# Patient Record
Sex: Female | Born: 1955
Health system: Southern US, Community
[De-identification: ages and names within clinical notes are randomized; demographics above are authoritative.]

## PROBLEM LIST (undated history)

## (undated) DIAGNOSIS — G510 Bell's palsy: Secondary | ICD-10-CM

## (undated) DIAGNOSIS — D649 Anemia, unspecified: Secondary | ICD-10-CM

## (undated) DIAGNOSIS — O1490 Unspecified pre-eclampsia, unspecified trimester: Secondary | ICD-10-CM

## (undated) DIAGNOSIS — I10 Essential (primary) hypertension: Secondary | ICD-10-CM

## (undated) DIAGNOSIS — K7460 Unspecified cirrhosis of liver: Secondary | ICD-10-CM

## (undated) DIAGNOSIS — F1910 Other psychoactive substance abuse, uncomplicated: Secondary | ICD-10-CM

## (undated) DIAGNOSIS — M419 Scoliosis, unspecified: Secondary | ICD-10-CM

## (undated) DIAGNOSIS — F32A Depression, unspecified: Secondary | ICD-10-CM

## (undated) DIAGNOSIS — N289 Disorder of kidney and ureter, unspecified: Secondary | ICD-10-CM

## (undated) DIAGNOSIS — I2510 Atherosclerotic heart disease of native coronary artery without angina pectoris: Secondary | ICD-10-CM

## (undated) DIAGNOSIS — E119 Type 2 diabetes mellitus without complications: Secondary | ICD-10-CM

## (undated) DIAGNOSIS — J449 Chronic obstructive pulmonary disease, unspecified: Secondary | ICD-10-CM

## (undated) DIAGNOSIS — G8929 Other chronic pain: Secondary | ICD-10-CM

## (undated) DIAGNOSIS — M1990 Unspecified osteoarthritis, unspecified site: Secondary | ICD-10-CM

## (undated) DIAGNOSIS — H269 Unspecified cataract: Secondary | ICD-10-CM

## (undated) HISTORY — PX: CARPAL TUNNEL RELEASE: SHX101

## (undated) HISTORY — PX: ROTATOR CUFF REPAIR: SHX139

## (undated) HISTORY — PX: ELBOW SURGERY: SHX618

## (undated) HISTORY — PX: SHOULDER SURGERY: SHX246

## (undated) HISTORY — PX: TUBAL LIGATION: SHX77

## (undated) HISTORY — PX: BACK SURGERY: SHX140

---

## 1999-07-10 ENCOUNTER — Emergency Department (HOSPITAL_COMMUNITY): Admission: EM | Admit: 1999-07-10 | Discharge: 1999-07-10 | Payer: Self-pay | Admitting: Emergency Medicine

## 2001-05-02 ENCOUNTER — Encounter: Admission: RE | Admit: 2001-05-02 | Discharge: 2001-06-01 | Payer: Self-pay | Admitting: Orthopaedic Surgery

## 2003-03-07 ENCOUNTER — Encounter: Admission: RE | Admit: 2003-03-07 | Discharge: 2003-03-07 | Payer: Self-pay | Admitting: *Deleted

## 2006-09-08 ENCOUNTER — Encounter: Admission: RE | Admit: 2006-09-08 | Discharge: 2006-09-08 | Payer: Self-pay | Admitting: *Deleted

## 2007-10-07 ENCOUNTER — Encounter: Admission: RE | Admit: 2007-10-07 | Discharge: 2007-10-07 | Payer: Self-pay | Admitting: *Deleted

## 2008-06-04 ENCOUNTER — Encounter: Admission: RE | Admit: 2008-06-04 | Discharge: 2008-06-04 | Payer: Self-pay | Admitting: Gastroenterology

## 2008-10-08 ENCOUNTER — Encounter: Admission: RE | Admit: 2008-10-08 | Discharge: 2008-10-08 | Payer: Self-pay | Admitting: *Deleted

## 2009-11-06 ENCOUNTER — Encounter: Admission: RE | Admit: 2009-11-06 | Discharge: 2009-11-06 | Payer: Self-pay | Admitting: *Deleted

## 2010-11-19 ENCOUNTER — Other Ambulatory Visit: Payer: Self-pay | Admitting: *Deleted

## 2010-11-19 DIAGNOSIS — Z1231 Encounter for screening mammogram for malignant neoplasm of breast: Secondary | ICD-10-CM

## 2010-11-26 ENCOUNTER — Ambulatory Visit
Admission: RE | Admit: 2010-11-26 | Discharge: 2010-11-26 | Disposition: A | Discharge status: Home / Self Care | Payer: Commercial Managed Care - PPO | Source: Ambulatory Visit | Attending: *Deleted | Admitting: *Deleted

## 2010-11-26 DIAGNOSIS — Z1231 Encounter for screening mammogram for malignant neoplasm of breast: Secondary | ICD-10-CM

## 2011-06-19 DIAGNOSIS — K5900 Constipation, unspecified: Secondary | ICD-10-CM | POA: Diagnosis not present

## 2011-06-19 DIAGNOSIS — M4120 Other idiopathic scoliosis, site unspecified: Secondary | ICD-10-CM | POA: Diagnosis not present

## 2011-06-19 DIAGNOSIS — F329 Major depressive disorder, single episode, unspecified: Secondary | ICD-10-CM | POA: Diagnosis not present

## 2011-06-19 DIAGNOSIS — N390 Urinary tract infection, site not specified: Secondary | ICD-10-CM | POA: Diagnosis not present

## 2011-06-19 DIAGNOSIS — F3289 Other specified depressive episodes: Secondary | ICD-10-CM | POA: Diagnosis not present

## 2011-09-04 ENCOUNTER — Encounter (HOSPITAL_COMMUNITY): Payer: Self-pay | Admitting: *Deleted

## 2011-09-04 ENCOUNTER — Emergency Department (HOSPITAL_COMMUNITY)
Admission: EM | Admit: 2011-09-04 | Discharge: 2011-09-04 | Disposition: A | Discharge status: Home / Self Care | Payer: Commercial Managed Care - PPO | Attending: Emergency Medicine | Admitting: Emergency Medicine

## 2011-09-04 DIAGNOSIS — Z79899 Other long term (current) drug therapy: Secondary | ICD-10-CM | POA: Diagnosis not present

## 2011-09-04 DIAGNOSIS — N390 Urinary tract infection, site not specified: Secondary | ICD-10-CM | POA: Insufficient documentation

## 2011-09-04 DIAGNOSIS — G8929 Other chronic pain: Secondary | ICD-10-CM | POA: Insufficient documentation

## 2011-09-04 HISTORY — DX: Other chronic pain: G89.29

## 2011-09-04 LAB — COMPREHENSIVE METABOLIC PANEL
ALT: 26 U/L (ref 0–35)
AST: 22 U/L (ref 0–37)
Albumin: 3.9 g/dL (ref 3.5–5.2)
Alkaline Phosphatase: 97 U/L (ref 39–117)
BUN: 13 mg/dL (ref 6–23)
CO2: 26 mEq/L (ref 19–32)
Calcium: 9.5 mg/dL (ref 8.4–10.5)
Chloride: 103 mEq/L (ref 96–112)
Creatinine, Ser: 0.76 mg/dL (ref 0.50–1.10)
GFR calc Af Amer: >90 mL/min (ref >90)
GFR calc non Af Amer: >90 mL/min (ref >90)
Glucose, Bld: 83 mg/dL (ref 70–99)
Potassium: 4.7 mEq/L (ref 3.5–5.1)
Sodium: 139 mEq/L (ref 135–145)
Total Bilirubin: 0.2 mg/dL — ABNORMAL LOW (ref 0.3–1.2)
Total Protein: 8.1 g/dL (ref 6.0–8.3)

## 2011-09-04 LAB — DIFFERENTIAL
Basophils Absolute: 0 10*3/uL (ref 0.0–0.1)
Basophils Relative: 1 % (ref 0–1)
Eosinophils Absolute: 0.1 10*3/uL (ref 0.0–0.7)
Eosinophils Relative: 2 % (ref 0–5)
Lymphocytes Relative: 39 % (ref 12–46)
Lymphs Abs: 2.2 10*3/uL (ref 0.7–4.0)
Monocytes Absolute: 0.6 10*3/uL (ref 0.1–1.0)
Monocytes Relative: 10 % (ref 3–12)
Neutro Abs: 2.8 10*3/uL (ref 1.7–7.7)
Neutrophils Relative %: 49 % (ref 43–77)

## 2011-09-04 LAB — URINALYSIS, ROUTINE W REFLEX MICROSCOPIC
Bilirubin Urine: NEGATIVE
Glucose, UA: NEGATIVE mg/dL
Hgb urine dipstick: NEGATIVE
Ketones, ur: NEGATIVE mg/dL
Nitrite: NEGATIVE
Protein, ur: NEGATIVE mg/dL
Specific Gravity, Urine: 1.021 (ref 1.005–1.030)
Urobilinogen, UA: 1 mg/dL (ref 0.0–1.0)
pH: 7 (ref 5.0–8.0)

## 2011-09-04 LAB — URINE MICROSCOPIC-ADD ON

## 2011-09-04 LAB — CBC
HCT: 42.3 % (ref 36.0–46.0)
Hemoglobin: 13.6 g/dL (ref 12.0–15.0)
MCH: 29.1 pg (ref 26.0–34.0)
MCHC: 32.2 g/dL (ref 30.0–36.0)
MCV: 90.4 fL (ref 78.0–100.0)
Platelets: 189 10*3/uL (ref 150–400)
RBC: 4.68 MIL/uL (ref 3.87–5.11)
RDW: 13.4 % (ref 11.5–15.5)
WBC: 5.7 10*3/uL (ref 4.0–10.5)

## 2011-09-04 LAB — RAPID URINE DRUG SCREEN, HOSP PERFORMED
Amphetamines: NOT DETECTED
Barbiturates: NOT DETECTED
Benzodiazepines: NOT DETECTED
Cocaine: NOT DETECTED
Opiates: NOT DETECTED
Tetrahydrocannabinol: NOT DETECTED

## 2011-09-04 LAB — ETHANOL: Alcohol, Ethyl (B): <11 mg/dL (ref 0–11)

## 2011-09-04 MED ORDER — CEPHALEXIN 500 MG PO CAPS: 500.0000 mg | ORAL_CAPSULE | Freq: Four times a day (QID) | ORAL | Status: AC

## 2011-09-04 MED ORDER — OXYCODONE-ACETAMINOPHEN 10-325 MG PO TABS: 1.0000 | ORAL_TABLET | ORAL | Status: AC | PRN

## 2011-09-04 NOTE — ED Notes (Signed)
The pt has pain in all her muscles but especially in her lt hip  And leg sicne Saturday since she ran out of her pain meds.  She thihks she is in withdrawal.  She has been on narcotic pain meds for 13 years and she recently  Lost her prescribing doctor  Doctor to a disagreement.  She was taking oxycontin 80mg  3 times a day and oxycodone 10mg  4 times a day until sat.  She has chronic pain

## 2011-09-04 NOTE — ED Notes (Signed)
Pt given rx x 2, discharge and follow up instructions without further questions after speaking with MD. Denies further needs at this time

## 2011-09-04 NOTE — Discharge Instructions (Signed)
You should return to the ED with any concerns including fever/chills, vomiting and not able to keep down liquids, decreased level of alertness/lethargy, or any other alarming symptoms  You should make sure to arrange for a follow up appointment with Pain management

## 2011-09-04 NOTE — ED Notes (Signed)
Pain to left hip and generalized across body. Hx of multiple surgeries to back/shoulders/elbow. States no longer has PCP and is out of pain medications. No new injury. States needs her oxycontin and percocet filled.

## 2011-09-04 NOTE — ED Provider Notes (Signed)
History     CSN: [id number]  Arrival date & time 09/04/11  1631   First MD Initiated Contact with Patient 09/04/11 1825      Chief Complaint  Patient presents with  . pain multiple sites     (Consider location/radiation/quality/duration/timing/severity/associated sxs/prior treatment) HPI Pt presents with c/o chronic pain and has run out of her pain medications.  She states she takes oxycontin three times daily and percocet 10.  She has had a disagreement and "parted ways" with her primary doctor who had been prescribing her meds.  She states she has been weaning the meds taking smaller doses than usual to make the meds that she has last.  She is afraid she is in withdrawal, however- no sweats, no vomiting/diarrhea, no abdominal pain.  There are no other associated systemic symptoms.   Past Medical History  Diagnosis Date  . Chronic pain     History reviewed. No pertinent past surgical history.  No family history on file.  History  Substance Use Topics  . Smoking status: Never Smoker   . Smokeless tobacco: Not on file  . Alcohol Use: No    OB History    Grav Para Term Preterm Abortions TAB SAB Ect Mult Living                  Review of Systems ROS reviewed and all otherwise negative except for mentioned in HPI  Allergies  Review of patient's allergies indicates no known allergies.  Home Medications   Current Outpatient Rx  Name Route Sig Dispense Refill  . ARIPIPRAZOLE 5 MG PO TABS Oral Take 5 mg by mouth daily.    . BUPROPION HCL ER (SR) 150 MG PO TB12 Oral Take 150 mg by mouth daily.    Marland Kitchen CITALOPRAM HYDROBROMIDE 40 MG PO TABS Oral Take 40 mg by mouth daily.    Marland Kitchen FERROUS FUM-IRON POLYSACCH 162-115.2 MG PO CAPS Oral Take 1 capsule by mouth daily with breakfast.    . LUBIPROSTONE 24 MCG PO CAPS Oral Take 24 mcg by mouth 3 (three) times daily.    Marland Kitchen MAGNESIUM OXIDE 400 MG PO TABS Oral Take 400 mg by mouth daily.    Marland Kitchen MODAFINIL 200 MG PO TABS Oral Take 200 mg by  mouth daily.    . OXYBUTYNIN CHLORIDE 5 MG PO TABS Oral Take 5 mg by mouth daily.    . OXYCODONE HCL ER 80 MG PO TB12 Oral Take 80 mg by mouth every 12 (twelve) hours.    . OXYCODONE-ACETAMINOPHEN 10-325 MG PO TABS Oral Take 1 tablet by mouth every 4 (four) hours as needed. For pain    . PANTOPRAZOLE SODIUM 40 MG PO TBEC Oral Take 40 mg by mouth daily.    Marland Kitchen PRAMIPEXOLE DIHYDROCHLORIDE 1 MG PO TABS Oral Take 1 mg by mouth daily.    . SENNA 8.6 MG PO TABS Oral Take 1 tablet by mouth 4 (four) times daily - after meals and at bedtime.    Marland Kitchen TIZANIDINE HCL 4 MG PO TABS Oral Take 4 mg by mouth 4 (four) times daily.    . TOPIRAMATE 100 MG PO TABS Oral Take 100 mg by mouth 2 (two) times daily.    . TORSEMIDE 20 MG PO TABS Oral Take 20 mg by mouth daily.    . CEPHALEXIN 500 MG PO CAPS Oral Take 1 capsule (500 mg total) by mouth 4 (four) times daily. 40 capsule 0  . OXYCODONE-ACETAMINOPHEN 10-325 MG PO TABS Oral  Take 1 tablet by mouth every 4 (four) hours as needed for pain. 15 tablet 0    BP 173/89  Pulse 74  Temp(Src) 98.6 F (37 C) (Oral)  Resp 18  SpO2 95%  LMP 04/06/2011 Vitals reviewed Physical Exam Physical Examination: General appearance - alert, well appearing, and in no distress Mental status - alert, oriented to person, place, and time Chest - clear to auscultation, no wheezes, rales or rhonchi, symmetric air entry Heart - normal rate, regular rhythm, normal S1, S2, no murmurs, rubs, clicks or gallops Abdomen - soft, nontender, nondistended, no masses or organomegaly Neurological - alert, oriented, normal speech, no focal findings or movement disorder noted Musculoskeletal - no joint tenderness, deformity or swelling Extremities - peripheral pulses normal, no pedal edema, no clubbing or cyanosis Skin - normal coloration and turgor, no rashes, no suspicious skin lesions noted pscyh- calm and cooperative, normal mood  ED Course  Procedures (including critical care time)  Labs  Reviewed  URINALYSIS, ROUTINE W REFLEX MICROSCOPIC - Abnormal; Notable for the following:    APPearance HAZY (*)    Leukocytes, UA MODERATE (*)    All other components within normal limits  COMPREHENSIVE METABOLIC PANEL - Abnormal; Notable for the following:    Total Bilirubin 0.2 (*)    All other components within normal limits  URINE MICROSCOPIC-ADD ON - Abnormal; Notable for the following:    Squamous Epithelial / LPF MANY (*)    Bacteria, UA FEW (*)    All other components within normal limits  URINE RAPID DRUG SCREEN (HOSP PERFORMED)  CBC  DIFFERENTIAL  ETHANOL  LAB REPORT - SCANNED   No results found.   1. Chronic pain   2. Urinary tract infection       MDM  Pt presenting with diffuse/generalized body pain which she states is chronic in nature- she has run out of oxycontin and oxycodone.  I have discussed pain management with her and the fact that she will need to see a pain management specialist or PMD to prescribe her long acting narcotics and chronic meds.  She was given a short course of oxycodone, also abx for likely UTI.  Urine culture sent.  Pt discharged with stirct return rpecautions, she is agreeable with this plan.         Ethelda Chick, MD 09/05/11 2017

## 2011-10-23 DIAGNOSIS — M545 Low back pain, unspecified: Secondary | ICD-10-CM | POA: Diagnosis not present

## 2011-10-23 DIAGNOSIS — M533 Sacrococcygeal disorders, not elsewhere classified: Secondary | ICD-10-CM | POA: Diagnosis not present

## 2011-10-23 DIAGNOSIS — G541 Lumbosacral plexus disorders: Secondary | ICD-10-CM | POA: Diagnosis not present

## 2011-10-23 DIAGNOSIS — Z9181 History of falling: Secondary | ICD-10-CM | POA: Diagnosis not present

## 2011-10-23 DIAGNOSIS — M542 Cervicalgia: Secondary | ICD-10-CM | POA: Diagnosis not present

## 2011-10-23 DIAGNOSIS — M5137 Other intervertebral disc degeneration, lumbosacral region: Secondary | ICD-10-CM | POA: Diagnosis not present

## 2011-10-23 DIAGNOSIS — Z79899 Other long term (current) drug therapy: Secondary | ICD-10-CM | POA: Diagnosis not present

## 2011-10-23 DIAGNOSIS — H81399 Other peripheral vertigo, unspecified ear: Secondary | ICD-10-CM | POA: Diagnosis not present

## 2011-10-23 DIAGNOSIS — R269 Unspecified abnormalities of gait and mobility: Secondary | ICD-10-CM | POA: Diagnosis not present

## 2011-10-23 DIAGNOSIS — S335XXA Sprain of ligaments of lumbar spine, initial encounter: Secondary | ICD-10-CM | POA: Diagnosis not present

## 2011-11-03 ENCOUNTER — Other Ambulatory Visit: Payer: Self-pay | Admitting: *Deleted

## 2011-11-03 ENCOUNTER — Other Ambulatory Visit: Payer: Self-pay | Admitting: Family Medicine

## 2011-11-03 DIAGNOSIS — Z1231 Encounter for screening mammogram for malignant neoplasm of breast: Secondary | ICD-10-CM

## 2011-11-09 DIAGNOSIS — R269 Unspecified abnormalities of gait and mobility: Secondary | ICD-10-CM | POA: Diagnosis not present

## 2011-11-09 DIAGNOSIS — M25559 Pain in unspecified hip: Secondary | ICD-10-CM | POA: Diagnosis not present

## 2011-11-12 DIAGNOSIS — M25559 Pain in unspecified hip: Secondary | ICD-10-CM | POA: Diagnosis not present

## 2011-11-12 DIAGNOSIS — R269 Unspecified abnormalities of gait and mobility: Secondary | ICD-10-CM | POA: Diagnosis not present

## 2011-11-16 DIAGNOSIS — M545 Low back pain, unspecified: Secondary | ICD-10-CM | POA: Diagnosis not present

## 2011-11-16 DIAGNOSIS — H81399 Other peripheral vertigo, unspecified ear: Secondary | ICD-10-CM | POA: Diagnosis not present

## 2011-11-16 DIAGNOSIS — G541 Lumbosacral plexus disorders: Secondary | ICD-10-CM | POA: Diagnosis not present

## 2011-11-16 DIAGNOSIS — M5137 Other intervertebral disc degeneration, lumbosacral region: Secondary | ICD-10-CM | POA: Diagnosis not present

## 2011-11-20 DIAGNOSIS — H81399 Other peripheral vertigo, unspecified ear: Secondary | ICD-10-CM | POA: Diagnosis not present

## 2011-11-20 DIAGNOSIS — G541 Lumbosacral plexus disorders: Secondary | ICD-10-CM | POA: Diagnosis not present

## 2011-11-20 DIAGNOSIS — M545 Low back pain, unspecified: Secondary | ICD-10-CM | POA: Diagnosis not present

## 2011-11-20 DIAGNOSIS — M5137 Other intervertebral disc degeneration, lumbosacral region: Secondary | ICD-10-CM | POA: Diagnosis not present

## 2011-12-01 ENCOUNTER — Ambulatory Visit
Admission: RE | Admit: 2011-12-01 | Discharge: 2011-12-01 | Disposition: A | Discharge status: Home / Self Care | Payer: Medicare Other | Source: Ambulatory Visit | Attending: Family Medicine | Admitting: Family Medicine

## 2011-12-01 DIAGNOSIS — Z1231 Encounter for screening mammogram for malignant neoplasm of breast: Secondary | ICD-10-CM

## 2011-12-02 ENCOUNTER — Other Ambulatory Visit: Payer: Self-pay | Admitting: Family Medicine

## 2011-12-02 DIAGNOSIS — R928 Other abnormal and inconclusive findings on diagnostic imaging of breast: Secondary | ICD-10-CM

## 2011-12-08 ENCOUNTER — Ambulatory Visit
Admission: RE | Admit: 2011-12-08 | Discharge: 2011-12-08 | Disposition: A | Discharge status: Home / Self Care | Payer: Commercial Managed Care - PPO | Source: Ambulatory Visit | Attending: Family Medicine | Admitting: Family Medicine

## 2011-12-08 DIAGNOSIS — R928 Other abnormal and inconclusive findings on diagnostic imaging of breast: Secondary | ICD-10-CM

## 2011-12-15 DIAGNOSIS — G541 Lumbosacral plexus disorders: Secondary | ICD-10-CM | POA: Diagnosis not present

## 2011-12-15 DIAGNOSIS — M5137 Other intervertebral disc degeneration, lumbosacral region: Secondary | ICD-10-CM | POA: Diagnosis not present

## 2011-12-15 DIAGNOSIS — H81399 Other peripheral vertigo, unspecified ear: Secondary | ICD-10-CM | POA: Diagnosis not present

## 2011-12-15 DIAGNOSIS — M545 Low back pain, unspecified: Secondary | ICD-10-CM | POA: Diagnosis not present

## 2012-01-12 DIAGNOSIS — G541 Lumbosacral plexus disorders: Secondary | ICD-10-CM | POA: Diagnosis not present

## 2012-01-12 DIAGNOSIS — M545 Low back pain, unspecified: Secondary | ICD-10-CM | POA: Diagnosis not present

## 2012-01-12 DIAGNOSIS — H81399 Other peripheral vertigo, unspecified ear: Secondary | ICD-10-CM | POA: Diagnosis not present

## 2012-01-12 DIAGNOSIS — M5137 Other intervertebral disc degeneration, lumbosacral region: Secondary | ICD-10-CM | POA: Diagnosis not present

## 2012-01-12 DIAGNOSIS — Z79899 Other long term (current) drug therapy: Secondary | ICD-10-CM | POA: Diagnosis not present

## 2012-01-18 DIAGNOSIS — M5380 Other specified dorsopathies, site unspecified: Secondary | ICD-10-CM | POA: Diagnosis not present

## 2012-03-07 DIAGNOSIS — Z79899 Other long term (current) drug therapy: Secondary | ICD-10-CM | POA: Diagnosis not present

## 2012-03-07 DIAGNOSIS — M542 Cervicalgia: Secondary | ICD-10-CM | POA: Diagnosis not present

## 2012-04-04 DIAGNOSIS — Z79899 Other long term (current) drug therapy: Secondary | ICD-10-CM | POA: Diagnosis not present

## 2012-04-04 DIAGNOSIS — M542 Cervicalgia: Secondary | ICD-10-CM | POA: Diagnosis not present

## 2012-05-17 ENCOUNTER — Other Ambulatory Visit: Payer: Self-pay | Admitting: Family Medicine

## 2012-05-17 DIAGNOSIS — N63 Unspecified lump in unspecified breast: Secondary | ICD-10-CM

## 2012-06-10 ENCOUNTER — Ambulatory Visit
Admission: RE | Admit: 2012-06-10 | Discharge: 2012-06-10 | Disposition: A | Discharge status: Home / Self Care | Payer: Commercial Managed Care - PPO | Source: Ambulatory Visit | Attending: Family Medicine | Admitting: Family Medicine

## 2012-06-10 DIAGNOSIS — N63 Unspecified lump in unspecified breast: Secondary | ICD-10-CM

## 2013-01-05 ENCOUNTER — Other Ambulatory Visit: Payer: Self-pay

## 2013-01-05 DIAGNOSIS — Z1231 Encounter for screening mammogram for malignant neoplasm of breast: Secondary | ICD-10-CM

## 2013-01-25 ENCOUNTER — Ambulatory Visit
Admission: RE | Admit: 2013-01-25 | Discharge: 2013-01-25 | Disposition: A | Discharge status: Home / Self Care | Payer: Commercial Managed Care - PPO | Source: Ambulatory Visit

## 2013-01-25 DIAGNOSIS — Z1231 Encounter for screening mammogram for malignant neoplasm of breast: Secondary | ICD-10-CM

## 2013-07-08 ENCOUNTER — Emergency Department (HOSPITAL_COMMUNITY): Payer: Commercial Managed Care - PPO

## 2013-07-08 ENCOUNTER — Observation Stay (HOSPITAL_COMMUNITY)
Admission: EM | Admit: 2013-07-08 | Discharge: 2013-07-10 | Disposition: A | Discharge status: Home / Self Care | Payer: Commercial Managed Care - PPO | Attending: Neurosurgery | Admitting: Neurosurgery

## 2013-07-08 ENCOUNTER — Encounter (HOSPITAL_COMMUNITY): Payer: Self-pay | Admitting: Emergency Medicine

## 2013-07-08 DIAGNOSIS — M4802 Spinal stenosis, cervical region: Secondary | ICD-10-CM | POA: Diagnosis not present

## 2013-07-08 DIAGNOSIS — M4120 Other idiopathic scoliosis, site unspecified: Secondary | ICD-10-CM | POA: Insufficient documentation

## 2013-07-08 DIAGNOSIS — M47817 Spondylosis without myelopathy or radiculopathy, lumbosacral region: Secondary | ICD-10-CM | POA: Diagnosis not present

## 2013-07-08 DIAGNOSIS — E119 Type 2 diabetes mellitus without complications: Secondary | ICD-10-CM | POA: Diagnosis not present

## 2013-07-08 DIAGNOSIS — M47812 Spondylosis without myelopathy or radiculopathy, cervical region: Secondary | ICD-10-CM | POA: Diagnosis not present

## 2013-07-08 DIAGNOSIS — I10 Essential (primary) hypertension: Secondary | ICD-10-CM | POA: Insufficient documentation

## 2013-07-08 DIAGNOSIS — R32 Unspecified urinary incontinence: Secondary | ICD-10-CM | POA: Insufficient documentation

## 2013-07-08 DIAGNOSIS — M4712 Other spondylosis with myelopathy, cervical region: Secondary | ICD-10-CM | POA: Diagnosis not present

## 2013-07-08 DIAGNOSIS — G8929 Other chronic pain: Secondary | ICD-10-CM | POA: Insufficient documentation

## 2013-07-08 DIAGNOSIS — Z9181 History of falling: Secondary | ICD-10-CM | POA: Insufficient documentation

## 2013-07-08 DIAGNOSIS — H269 Unspecified cataract: Secondary | ICD-10-CM | POA: Insufficient documentation

## 2013-07-08 DIAGNOSIS — G959 Disease of spinal cord, unspecified: Secondary | ICD-10-CM

## 2013-07-08 DIAGNOSIS — M48061 Spinal stenosis, lumbar region without neurogenic claudication: Secondary | ICD-10-CM | POA: Diagnosis not present

## 2013-07-08 HISTORY — DX: Unspecified cataract: H26.9

## 2013-07-08 HISTORY — DX: Disorder of kidney and ureter, unspecified: N28.9

## 2013-07-08 HISTORY — DX: Unspecified osteoarthritis, unspecified site: M19.90

## 2013-07-08 HISTORY — DX: Unspecified pre-eclampsia, unspecified trimester: O14.90

## 2013-07-08 HISTORY — DX: Bell's palsy: G51.0

## 2013-07-08 HISTORY — DX: Scoliosis, unspecified: M41.9

## 2013-07-08 LAB — URINALYSIS, ROUTINE W REFLEX MICROSCOPIC
BILIRUBIN URINE: NEGATIVE
Glucose, UA: NEGATIVE mg/dL
Hgb urine dipstick: NEGATIVE
Ketones, ur: NEGATIVE mg/dL
Leukocytes, UA: NEGATIVE
Nitrite: NEGATIVE
PH: 6.5 (ref 5.0–8.0)
Protein, ur: NEGATIVE mg/dL
Specific Gravity, Urine: 1.014 (ref 1.005–1.030)
Urobilinogen, UA: 0.2 mg/dL (ref 0.0–1.0)

## 2013-07-08 LAB — COMPREHENSIVE METABOLIC PANEL
ALBUMIN: 3.7 g/dL (ref 3.5–5.2)
ALK PHOS: 87 U/L (ref 39–117)
ALT: 28 U/L (ref 0–35)
AST: 23 U/L (ref 0–37)
BUN: 22 mg/dL (ref 6–23)
CO2: 26 mEq/L (ref 19–32)
Calcium: 9.8 mg/dL (ref 8.4–10.5)
Chloride: 100 mEq/L (ref 96–112)
Creatinine, Ser: 0.82 mg/dL (ref 0.50–1.10)
GFR calc Af Amer: >90 mL/min (ref >90)
GFR calc non Af Amer: 78 mL/min — ABNORMAL LOW (ref >90)
GLUCOSE: 161 mg/dL — AB (ref 70–99)
Potassium: 4.5 mEq/L (ref 3.7–5.3)
SODIUM: 138 meq/L (ref 137–147)
TOTAL PROTEIN: 7.5 g/dL (ref 6.0–8.3)
Total Bilirubin: 0.2 mg/dL — ABNORMAL LOW (ref 0.3–1.2)

## 2013-07-08 LAB — CBC
HCT: 39.6 % (ref 36.0–46.0)
HEMOGLOBIN: 13.8 g/dL (ref 12.0–15.0)
MCH: 29.6 pg (ref 26.0–34.0)
MCHC: 34.8 g/dL (ref 30.0–36.0)
MCV: 85 fL (ref 78.0–100.0)
Platelets: 229 10*3/uL (ref 150–400)
RBC: 4.66 MIL/uL (ref 3.87–5.11)
RDW: 13.5 % (ref 11.5–15.5)
WBC: 6.4 10*3/uL (ref 4.0–10.5)

## 2013-07-08 MED ORDER — HYDROMORPHONE HCL PF 1 MG/ML IJ SOLN
1.0000 mg | INTRAMUSCULAR | Status: DC | PRN
  Administered 2013-07-09: 1 mg via INTRAVENOUS

## 2013-07-08 MED ORDER — HYDROMORPHONE HCL PF 1 MG/ML IJ SOLN
1.0000 mg | INTRAMUSCULAR | Status: AC | PRN
  Administered 2013-07-08 (×2): 1 mg via INTRAVENOUS
  Filled 2013-07-08 (×2): qty 1

## 2013-07-08 MED ORDER — DEXTROSE-NACL 5-0.45 % IV SOLN: INTRAVENOUS | Status: DC

## 2013-07-08 MED ORDER — SODIUM CHLORIDE 0.9 % IV SOLN
INTRAVENOUS | Status: DC
  Administered 2013-07-08: 23:00:00 via INTRAVENOUS

## 2013-07-08 MED ORDER — ONDANSETRON HCL 4 MG/2ML IJ SOLN
4.0000 mg | Freq: Once | INTRAMUSCULAR | Status: AC
  Administered 2013-07-08: 4 mg via INTRAVENOUS
  Filled 2013-07-08: qty 2

## 2013-07-08 MED ORDER — DOCUSATE SODIUM 100 MG PO CAPS
100.0000 mg | ORAL_CAPSULE | Freq: Two times a day (BID) | ORAL | Status: DC
  Administered 2013-07-08 – 2013-07-10 (×4): 100 mg via ORAL
  Filled 2013-07-08 (×4): qty 1

## 2013-07-08 MED ORDER — ACETAMINOPHEN 325 MG PO TABS
650.0000 mg | ORAL_TABLET | Freq: Four times a day (QID) | ORAL | Status: DC | PRN
  Administered 2013-07-09 – 2013-07-10 (×2): 650 mg via ORAL
  Filled 2013-07-08: qty 2

## 2013-07-08 MED ORDER — ACETAMINOPHEN 650 MG RE SUPP: 650.0000 mg | Freq: Four times a day (QID) | RECTAL | Status: DC | PRN

## 2013-07-08 MED ORDER — OXYCODONE HCL 5 MG PO TABS
5.0000 mg | ORAL_TABLET | ORAL | Status: DC | PRN
  Administered 2013-07-09 – 2013-07-10 (×6): 5 mg via ORAL
  Filled 2013-07-08 (×5): qty 1

## 2013-07-08 MED ORDER — ONDANSETRON HCL 4 MG/2ML IJ SOLN: 4.0000 mg | Freq: Four times a day (QID) | INTRAMUSCULAR | Status: DC | PRN

## 2013-07-08 MED ORDER — SENNA 8.6 MG PO TABS
1.0000 | ORAL_TABLET | Freq: Two times a day (BID) | ORAL | Status: DC
  Administered 2013-07-08 – 2013-07-10 (×4): 8.6 mg via ORAL
  Filled 2013-07-08 (×6): qty 1

## 2013-07-08 MED ORDER — ONDANSETRON HCL 4 MG/2ML IJ SOLN: 4.0000 mg | Freq: Three times a day (TID) | INTRAMUSCULAR | Status: DC | PRN

## 2013-07-08 MED ORDER — ONDANSETRON HCL 4 MG PO TABS: 4.0000 mg | ORAL_TABLET | Freq: Four times a day (QID) | ORAL | Status: DC | PRN

## 2013-07-08 NOTE — ED Notes (Signed)
Patient returned from MRI.

## 2013-07-08 NOTE — ED Notes (Signed)
Patient is still in MRI 

## 2013-07-08 NOTE — ED Provider Notes (Signed)
CSN: [id number]     Arrival date & time 07/08/13  1608 History   First MD Initiated Contact with Patient 07/08/13 1654     Chief Complaint  Patient presents with  . Leg Pain      HPI  Patient presents with complaint of neck pain, back pain,UE,weakness and clumsiness,  LE weakness. Patient has a distant history of previous L4-5 decompressive laminectomy. He presents with a month with symptoms. Progressive back. Her last reported pain in her neck. She is felt a "jellylike" feeling of her legs for the last 3 weeks. The point that she walks with her legs widely apart and she has to hold onto things. She was walking with a quad cane, and her husband got her a walker yesterday to help her with her symptoms at home. She has chronic back pain. She is not on chronic narcotics or any specific treatment for this.  3 days ago she was having trouble walking her bathroom she fell. She struck her forehead against her toilet. No loss of consciousness. Really minimal symptoms to her head. She has had some worsening neck pain. She noticed numbness and clumsiness of her hands since that time and she presents here. Intermittently for the last 3 weeks she has had urinary incontinence. She wears a pad at home.   Past Medical History  Diagnosis Date  . Chronic pain   . Renal disorder   . Scoliosis   . Toxemia in pregnancy   . Arthritis   . Bell's palsy   . Cataract    Past Surgical History  Procedure Laterality Date  . Back surgery    . Cesarean section    . Tubal ligation    . Shoulder surgery    . Rotator cuff repair    . Carpal tunnel release     History reviewed. No pertinent family history. History  Substance Use Topics  . Smoking status: Never Smoker   . Smokeless tobacco: Not on file  . Alcohol Use: No   OB History   Grav Para Term Preterm Abortions TAB SAB Ect Mult Living                 Review of Systems  Constitutional: Negative for fever, chills, diaphoresis, appetite change and  fatigue.  HENT: Negative for mouth sores, sore throat and trouble swallowing.   Eyes: Negative for visual disturbance.  Respiratory: Negative for cough, chest tightness, shortness of breath and wheezing.   Cardiovascular: Negative for chest pain.  Gastrointestinal: Negative for nausea, vomiting, abdominal pain, diarrhea and abdominal distention.  Endocrine: Negative for polydipsia, polyphagia and polyuria.  Genitourinary: Negative for dysuria, frequency and hematuria.       Incontinence  Musculoskeletal: Negative for gait problem.  Skin: Negative for color change, pallor and rash.  Neurological: Positive for weakness and numbness. Negative for dizziness, syncope, light-headedness and headaches.       Difficulty with strength balance and coordination of her lower extremities. Difficulty with coordination and numbness of her hands.  Hematological: Does not bruise/bleed easily.  Psychiatric/Behavioral: Negative for behavioral problems and confusion.      Allergies  Review of patient's allergies indicates no known allergies.  Home Medications   Current Outpatient Rx  Name  Route  Sig  Dispense  Refill  . amLODipine (NORVASC) 10 MG tablet   Oral   Take 10 mg by mouth daily.         Marland Kitchen buPROPion (WELLBUTRIN SR) 150 MG 12 hr tablet  Oral   Take 150 mg by mouth daily.         Marland Kitchen docusate sodium (COLACE) 100 MG capsule   Oral   Take 100 mg by mouth daily.         Marland Kitchen lisinopril (PRINIVIL,ZESTRIL) 10 MG tablet   Oral   Take 10 mg by mouth daily.         . meloxicam (MOBIC) 15 MG tablet   Oral   Take 15 mg by mouth daily.         . metFORMIN (GLUCOPHAGE) 500 MG tablet   Oral   Take 500 mg by mouth 2 (two) times daily with a meal.         . pantoprazole (PROTONIX) 40 MG tablet   Oral   Take 40 mg by mouth daily.         . pramipexole (MIRAPEX) 1 MG tablet   Oral   Take 1 mg by mouth daily.         . pravastatin (PRAVACHOL) 20 MG tablet   Oral   Take 20 mg  by mouth daily.         Marland Kitchen tiZANidine (ZANAFLEX) 4 MG tablet   Oral   Take 4 mg by mouth daily.          Marland Kitchen topiramate (TOPAMAX) 100 MG tablet   Oral   Take 100 mg by mouth daily.           BP 153/77  Pulse 88  Temp(Src) 97.9 F (36.6 C) (Oral)  Resp 18  Ht 5\' 2"  (1.575 m)  Wt 157 lb (71.215 kg)  BMI 28.71 kg/m2  SpO2 99%  LMP 04/06/2011 Physical Exam  Constitutional: She is oriented to person, place, and time. She appears well-developed and well-nourished. No distress.  HENT:  Head: Normocephalic.  Eyes: Conjunctivae are normal. Pupils are equal, round, and reactive to light. No scleral icterus.  Neck: Normal range of motion. Neck supple. No thyromegaly present.  Cardiovascular: Normal rate and regular rhythm.  Exam reveals no gallop and no friction rub.   No murmur heard. Pulmonary/Chest: Effort normal and breath sounds normal. No respiratory distress. She has no wheezes. She has no rales.  Abdominal: Soft. Bowel sounds are normal. She exhibits no distension. There is no tenderness. There is no rebound.  Musculoskeletal: Normal range of motion.  Neurological: She is alert and oriented to person, place, and time.  Doesn't have a positive Spurling sign. Doesn't have Lhermitte's phenomenon with neck flexion. Biceps reflexes are normal. She denies numbness of all 5 digits. Does not have frank radicular findings to either side. Her description of the numbness is symmetric. She is normal abdomen and abduction of the fingers, grip strength, wrist extension, biceps and triceps.  Lower extremities show a broad-based gait. She is 1+ to her Achilles and knee jerk reflexes. Normal Babinski. No clonus. Weakness to hip flexion, hip and knee extension, plantar flexion dorsiflexion. Symmetric bilaterally.  Skin: Skin is warm and dry. No rash noted.  Psychiatric: She has a normal mood and affect. Her behavior is normal.    ED Course  Procedures (including critical care time) Labs  Review Labs Reviewed  COMPREHENSIVE METABOLIC PANEL - Abnormal; Notable for the following:    Glucose, Bld 161 (*)    Total Bilirubin 0.2 (*)    GFR calc non Af Amer 78 (*)    All other components within normal limits  URINALYSIS, ROUTINE W REFLEX MICROSCOPIC - Abnormal; Notable for  the following:    APPearance CLOUDY (*)    All other components within normal limits  CBC   Imaging Review Mr Cervical Spine Wo Contrast  07/08/2013   CLINICAL DATA:  58 year old female with neck pain, upper extremity numbness. Fall earlier this week. Low back pain and difficulty walking. Initial encounter.  EXAM: MRI CERVICAL AND LUMBAR SPINE WITHOUT CONTRAST  TECHNIQUE: Multiplanar and multiecho pulse sequences of the cervical spine, to include the craniocervical junction and cervicothoracic junction, and lumbar spine, were obtained without intravenous contrast.  COMPARISON:  Report of a [facility] CT Abdomen and Pelvis 01/18/2007 (no images available).  FINDINGS: MRI CERVICAL SPINE FINDINGS  Cervicomedullary junction is within normal limits. Mildly exaggerated upper cervical lordosis. No marrow edema or evidence of acute osseous abnormality. Visualized posterior paraspinal soft tissues are within normal limits. No cervical spine ligamentous signal abnormality identified.  Negative visualized upper thoracic spine, down to the mid T5 level.  C2-C3: Left facet and uncovertebral hypertrophy. Mild left C3 foraminal stenosis.  C3-C4: Left facet and uncovertebral hypertrophy. Moderate to severe left C4 foraminal stenosis.  C4-C5: Circumferential disc bulge. Mild facet and uncovertebral hypertrophy. Mild ligament flavum hypertrophy. Borderline spinal stenosis. No spinal cord mass effect. Severe left and moderate right C5 foraminal stenosis.  C5-C6: Disc space loss. Circumferential disc osteophyte complex with bulky broad-based posterior component of disc. Severe ligament flavum hypertrophy. Subsequent moderate to  severe spinal stenosis with moderate spinal cord mass effect and associated focal abnormal spinal cord signal at this level (series 11, image 6, series 16, image 4, series 18, image 6). Superimposed severe left greater than right C6 foraminal stenosis.  C6-C7: Cephalad central to left paracentral disc extrusion. Mild to moderate ligament flavum hypertrophy. Effaced ventral CSF space and mild flattening of the ventral spinal cord. No cord signal abnormality. Facet and uncovertebral hypertrophy greater on the right. Mild left and moderate right C7 foraminal stenosis does not appear related to disc.  C7-T1: Moderate left and mild right facet hypertrophy. Largely unremarkable disc at the disc space level, but there is a subtle caudal extrusion of disc to the right of midline (series 11, image 4 and series 18, image 17). This effaces the ventral CSF space and the right hemi cord without significant spinal stenosis No significant foraminal stenosis.  MRI LUMBAR SPINE FINDINGS  Normal lumbar segmentation. Benign vertebral body hemangiomas at L1 and L4. Previous surgical decompression at L4-L5. Bilateral erector spinae muscle atrophy. No marrow edema or evidence of acute osseous abnormality. Mild chronic L3 superior endplate compression fracture. Occasional chronic Schmorl's nodes in the visible thoracic spine.  Negative visualized abdominal viscera.  Visualized lower thoracic spinal cord is normal with conus medularis at L1-L2.  T10-T11: Moderate left facet hypertrophy. Moderate left T10 foraminal stenosis.  T11-T12: Mild to moderate facet hypertrophy greater on the left. Mild to moderate T11 foraminal stenosis greater on the left.  T12-L1:  Moderate facet hypertrophy.  No stenosis.  L1-L2: Moderate facet and ligament flavum hypertrophy. Epidural lipomatosis. Borderline spinal stenosis.  L2-L3: Moderate to severe facet and ligament flavum hypertrophy greater on the left. This protrudes into the thecal sac (series 7, image  20). There is severe left lateral recess stenosis (descending left L3 nerve root level). There is overall moderate spinal stenosis. There is moderate left L2 foraminal stenosis.  L3-L4: Left foraminal annular fissure. Small left paracentral disc protrusion. Moderate facet hypertrophy. Overall mild to moderate spinal stenosis and mild left L3 foraminal stenosis.  L4-L5: Previous decompression.  Arthrodesis suspected. No stenosis.  L5-S1: Vestigial disc space. The L5 level appears sacralized and there is bilateral posterior element arthrodesis. No stenosis.  IMPRESSION: CERVICAL SPINE:  1. Moderate to severe degenerative spinal stenosis at C5-C6 with spinal cord compression and abnormal cord signal. History of recent fall makes this suspicious for spinal cord edema. Otherwise developing myelomalacia is possible. This finding discussed by telephone with Dr. Tanna Furry at at 7:10 PM on 07/08/2013. 2. C6-C7 and C7-T1 disc extrusions which could be acute in this setting. There is mild spinal stenosis at C6-C7 and no significant spinal stenosis at C7-T1. 3. Multifactorial severe cervical neural foraminal stenosis at the left C4, left C5, M bilateral C6 nerve levels.  LUMBAR SPINE:  1. Multifactorial severe left lateral recess and moderate spinal and left foraminal stenosis at L2-L3, primarily due to posterior element degeneration. The protruding portion of the left posterior element into the spinal canal could be hypertrophied ligament or facet, or perhaps calcified synovial cyst. 2. Prior lower lumbar decompression and fusion. 3. Multifactorial mild to moderate spinal and left foraminal stenosis at L3-L4.   Electronically Signed   By: Lars Pinks M.D.   On: 07/08/2013 19:31   Mr Lumbar Spine Wo Contrast  07/08/2013   CLINICAL DATA:  58 year old female with neck pain, upper extremity numbness. Fall earlier this week. Low back pain and difficulty walking. Initial encounter.  EXAM: MRI CERVICAL AND LUMBAR SPINE WITHOUT  CONTRAST  TECHNIQUE: Multiplanar and multiecho pulse sequences of the cervical spine, to include the craniocervical junction and cervicothoracic junction, and lumbar spine, were obtained without intravenous contrast.  COMPARISON:  Report of a [facility] CT Abdomen and Pelvis 01/18/2007 (no images available).  FINDINGS: MRI CERVICAL SPINE FINDINGS  Cervicomedullary junction is within normal limits. Mildly exaggerated upper cervical lordosis. No marrow edema or evidence of acute osseous abnormality. Visualized posterior paraspinal soft tissues are within normal limits. No cervical spine ligamentous signal abnormality identified.  Negative visualized upper thoracic spine, down to the mid T5 level.  C2-C3: Left facet and uncovertebral hypertrophy. Mild left C3 foraminal stenosis.  C3-C4: Left facet and uncovertebral hypertrophy. Moderate to severe left C4 foraminal stenosis.  C4-C5: Circumferential disc bulge. Mild facet and uncovertebral hypertrophy. Mild ligament flavum hypertrophy. Borderline spinal stenosis. No spinal cord mass effect. Severe left and moderate right C5 foraminal stenosis.  C5-C6: Disc space loss. Circumferential disc osteophyte complex with bulky broad-based posterior component of disc. Severe ligament flavum hypertrophy. Subsequent moderate to severe spinal stenosis with moderate spinal cord mass effect and associated focal abnormal spinal cord signal at this level (series 11, image 6, series 16, image 4, series 18, image 6). Superimposed severe left greater than right C6 foraminal stenosis.  C6-C7: Cephalad central to left paracentral disc extrusion. Mild to moderate ligament flavum hypertrophy. Effaced ventral CSF space and mild flattening of the ventral spinal cord. No cord signal abnormality. Facet and uncovertebral hypertrophy greater on the right. Mild left and moderate right C7 foraminal stenosis does not appear related to disc.  C7-T1: Moderate left and mild right facet  hypertrophy. Largely unremarkable disc at the disc space level, but there is a subtle caudal extrusion of disc to the right of midline (series 11, image 4 and series 18, image 17). This effaces the ventral CSF space and the right hemi cord without significant spinal stenosis No significant foraminal stenosis.  MRI LUMBAR SPINE FINDINGS  Normal lumbar segmentation. Benign vertebral body hemangiomas at L1 and L4. Previous surgical decompression at  L4-L5. Bilateral erector spinae muscle atrophy. No marrow edema or evidence of acute osseous abnormality. Mild chronic L3 superior endplate compression fracture. Occasional chronic Schmorl's nodes in the visible thoracic spine.  Negative visualized abdominal viscera.  Visualized lower thoracic spinal cord is normal with conus medularis at L1-L2.  T10-T11: Moderate left facet hypertrophy. Moderate left T10 foraminal stenosis.  T11-T12: Mild to moderate facet hypertrophy greater on the left. Mild to moderate T11 foraminal stenosis greater on the left.  T12-L1:  Moderate facet hypertrophy.  No stenosis.  L1-L2: Moderate facet and ligament flavum hypertrophy. Epidural lipomatosis. Borderline spinal stenosis.  L2-L3: Moderate to severe facet and ligament flavum hypertrophy greater on the left. This protrudes into the thecal sac (series 7, image 20). There is severe left lateral recess stenosis (descending left L3 nerve root level). There is overall moderate spinal stenosis. There is moderate left L2 foraminal stenosis.  L3-L4: Left foraminal annular fissure. Small left paracentral disc protrusion. Moderate facet hypertrophy. Overall mild to moderate spinal stenosis and mild left L3 foraminal stenosis.  L4-L5: Previous decompression. Arthrodesis suspected. No stenosis.  L5-S1: Vestigial disc space. The L5 level appears sacralized and there is bilateral posterior element arthrodesis. No stenosis.  IMPRESSION: CERVICAL SPINE:  1. Moderate to severe degenerative spinal stenosis at  C5-C6 with spinal cord compression and abnormal cord signal. History of recent fall makes this suspicious for spinal cord edema. Otherwise developing myelomalacia is possible. This finding discussed by telephone with Dr. Tanna Furry at at 7:10 PM on 07/08/2013. 2. C6-C7 and C7-T1 disc extrusions which could be acute in this setting. There is mild spinal stenosis at C6-C7 and no significant spinal stenosis at C7-T1. 3. Multifactorial severe cervical neural foraminal stenosis at the left C4, left C5, M bilateral C6 nerve levels.  LUMBAR SPINE:  1. Multifactorial severe left lateral recess and moderate spinal and left foraminal stenosis at L2-L3, primarily due to posterior element degeneration. The protruding portion of the left posterior element into the spinal canal could be hypertrophied ligament or facet, or perhaps calcified synovial cyst. 2. Prior lower lumbar decompression and fusion. 3. Multifactorial mild to moderate spinal and left foraminal stenosis at L3-L4.   Electronically Signed   By: Lars Pinks M.D.   On: 07/08/2013 19:31     EKG Interpretation None      MDM   Final diagnoses:  Cervical myelopathy    Patient sent for lumbar and cervical MRI. Results called to me by radiologist. She has ligament flavum hypertrophy at C6-7 with an effaced ventral CSF space flattening of ventral spinal cord per 56 disc space shows circumferential disc osteophyte complex with bulky broad-based disc herniation and marked L. and hypertrophy. Moderate severe spinal stenosis with 3 mm cord space. She has cord edema is the sixth.  L spine has L2-L3 acquired stenosis without significant Spiro space acute compromise.  Discuss with neurosurgery on call Dr.Nundkmar.  Plan is admission for a.m. decompression.    Tanna Furry, MD 07/08/13 2006

## 2013-07-08 NOTE — ED Notes (Signed)
Patient transported to MRI 

## 2013-07-08 NOTE — ED Notes (Signed)
She states her legs have "felt like jelly" for the past 3 weeks and today she was unable to stand at all. She had to use her walker to get around her house. She also had bladder incontinence today. She said shes had a history of back pain and back surgery as well as bladder trouble.

## 2013-07-09 ENCOUNTER — Observation Stay (HOSPITAL_COMMUNITY): Payer: Commercial Managed Care - PPO

## 2013-07-09 ENCOUNTER — Ambulatory Visit (HOSPITAL_COMMUNITY): Payer: Commercial Managed Care - PPO | Admitting: Anesthesiology

## 2013-07-09 ENCOUNTER — Encounter (HOSPITAL_COMMUNITY): Payer: Self-pay | Admitting: Anesthesiology

## 2013-07-09 ENCOUNTER — Encounter (HOSPITAL_COMMUNITY)
Admission: EM | Disposition: A | Discharge status: Home / Self Care | Payer: Self-pay | Source: Home / Self Care | Attending: Neurosurgery

## 2013-07-09 ENCOUNTER — Observation Stay (HOSPITAL_COMMUNITY): Payer: Commercial Managed Care - PPO | Admitting: Anesthesiology

## 2013-07-09 DIAGNOSIS — M5090 Cervical disc disorder, unspecified, unspecified cervical region: Secondary | ICD-10-CM | POA: Diagnosis not present

## 2013-07-09 DIAGNOSIS — M4712 Other spondylosis with myelopathy, cervical region: Secondary | ICD-10-CM | POA: Diagnosis not present

## 2013-07-09 HISTORY — PX: ANTERIOR CERVICAL DECOMP/DISCECTOMY FUSION: SHX1161

## 2013-07-09 LAB — PROTIME-INR
INR: 0.99 (ref 0.00–1.49)
Prothrombin Time: 12.9 seconds (ref 11.6–15.2)

## 2013-07-09 LAB — SURGICAL PCR SCREEN
MRSA, PCR: NEGATIVE
Staphylococcus aureus: POSITIVE — AB

## 2013-07-09 LAB — APTT: APTT: 26 s (ref 24–37)

## 2013-07-09 LAB — GLUCOSE, CAPILLARY: Glucose-Capillary: 138 mg/dL — ABNORMAL HIGH (ref 70–99)

## 2013-07-09 SURGERY — ANTERIOR CERVICAL DECOMPRESSION/DISCECTOMY FUSION 2 LEVELS
Anesthesia: General | Site: Neck

## 2013-07-09 MED ORDER — LIDOCAINE HCL (CARDIAC) 20 MG/ML IV SOLN
INTRAVENOUS | Status: DC | PRN
  Administered 2013-07-09: 50 mg via INTRAVENOUS

## 2013-07-09 MED ORDER — ARTIFICIAL TEARS OP OINT
TOPICAL_OINTMENT | OPHTHALMIC | Status: DC | PRN
  Administered 2013-07-09: 1 via OPHTHALMIC

## 2013-07-09 MED ORDER — ONDANSETRON HCL 4 MG/2ML IJ SOLN
INTRAMUSCULAR | Status: AC
  Filled 2013-07-09: qty 2

## 2013-07-09 MED ORDER — BUPIVACAINE HCL 0.5 % IJ SOLN
INTRAMUSCULAR | Status: DC | PRN
  Administered 2013-07-09: 4 mL

## 2013-07-09 MED ORDER — PHENYLEPHRINE HCL 10 MG/ML IJ SOLN
INTRAMUSCULAR | Status: AC
  Filled 2013-07-09: qty 1

## 2013-07-09 MED ORDER — PHENYLEPHRINE HCL 10 MG/ML IJ SOLN
INTRAMUSCULAR | Status: DC | PRN
  Administered 2013-07-09 (×2): 120 ug via INTRAVENOUS
  Administered 2013-07-09 (×3): 80 ug via INTRAVENOUS
  Administered 2013-07-09: 40 ug via INTRAVENOUS

## 2013-07-09 MED ORDER — AMLODIPINE BESYLATE 10 MG PO TABS
10.0000 mg | ORAL_TABLET | Freq: Every day | ORAL | Status: DC
  Administered 2013-07-10: 10 mg via ORAL
  Filled 2013-07-09: qty 1

## 2013-07-09 MED ORDER — METOCLOPRAMIDE HCL 5 MG/ML IJ SOLN
INTRAMUSCULAR | Status: AC
  Filled 2013-07-09: qty 2

## 2013-07-09 MED ORDER — MIDAZOLAM HCL 2 MG/2ML IJ SOLN
INTRAMUSCULAR | Status: AC
  Filled 2013-07-09: qty 2

## 2013-07-09 MED ORDER — FENTANYL CITRATE 0.05 MG/ML IJ SOLN
INTRAMUSCULAR | Status: DC | PRN
  Administered 2013-07-09 (×3): 50 ug via INTRAVENOUS
  Administered 2013-07-09: 100 ug via INTRAVENOUS

## 2013-07-09 MED ORDER — TOPIRAMATE 100 MG PO TABS
100.0000 mg | ORAL_TABLET | Freq: Every day | ORAL | Status: DC
  Administered 2013-07-09 – 2013-07-10 (×2): 100 mg via ORAL
  Filled 2013-07-09 (×2): qty 1

## 2013-07-09 MED ORDER — OXYCODONE HCL 5 MG/5ML PO SOLN: 5.0000 mg | Freq: Once | ORAL | Status: DC | PRN

## 2013-07-09 MED ORDER — SIMVASTATIN 10 MG PO TABS
10.0000 mg | ORAL_TABLET | Freq: Every day | ORAL | Status: DC
  Administered 2013-07-09 – 2013-07-10 (×2): 10 mg via ORAL
  Filled 2013-07-09 (×3): qty 1

## 2013-07-09 MED ORDER — ONDANSETRON HCL 4 MG/2ML IJ SOLN
INTRAMUSCULAR | Status: DC | PRN
  Administered 2013-07-09: 4 mg via INTRAVENOUS

## 2013-07-09 MED ORDER — MORPHINE SULFATE 2 MG/ML IJ SOLN
1.0000 mg | INTRAMUSCULAR | Status: DC | PRN
  Administered 2013-07-10 (×2): 2 mg via INTRAVENOUS
  Filled 2013-07-09 (×2): qty 1

## 2013-07-09 MED ORDER — MUPIROCIN 2 % EX OINT
1.0000 "application " | TOPICAL_OINTMENT | Freq: Two times a day (BID) | CUTANEOUS | Status: DC
  Administered 2013-07-09 – 2013-07-10 (×2): 1 via NASAL
  Filled 2013-07-09: qty 22

## 2013-07-09 MED ORDER — GLYCOPYRROLATE 0.2 MG/ML IJ SOLN
INTRAMUSCULAR | Status: DC | PRN
  Administered 2013-07-09: .6 mg via INTRAVENOUS

## 2013-07-09 MED ORDER — MENTHOL 3 MG MT LOZG: 1.0000 | LOZENGE | OROMUCOSAL | Status: DC | PRN

## 2013-07-09 MED ORDER — OXYCODONE HCL 5 MG PO TABS
ORAL_TABLET | ORAL | Status: AC
  Administered 2013-07-10: 5 mg via ORAL
  Filled 2013-07-09: qty 1

## 2013-07-09 MED ORDER — ROCURONIUM BROMIDE 100 MG/10ML IV SOLN
INTRAVENOUS | Status: DC | PRN
  Administered 2013-07-09: 50 mg via INTRAVENOUS

## 2013-07-09 MED ORDER — PHENYLEPHRINE 40 MCG/ML (10ML) SYRINGE FOR IV PUSH (FOR BLOOD PRESSURE SUPPORT)
PREFILLED_SYRINGE | INTRAVENOUS | Status: AC
  Filled 2013-07-09: qty 20

## 2013-07-09 MED ORDER — SODIUM CHLORIDE 0.9 % IJ SOLN: 3.0000 mL | INTRAMUSCULAR | Status: DC | PRN

## 2013-07-09 MED ORDER — PRAMIPEXOLE DIHYDROCHLORIDE 1 MG PO TABS
1.0000 mg | ORAL_TABLET | Freq: Every day | ORAL | Status: DC
  Administered 2013-07-09: 1 mg via ORAL
  Filled 2013-07-09 (×2): qty 1

## 2013-07-09 MED ORDER — TIZANIDINE HCL 4 MG PO TABS
4.0000 mg | ORAL_TABLET | Freq: Every day | ORAL | Status: DC
  Administered 2013-07-09 – 2013-07-10 (×2): 4 mg via ORAL
  Filled 2013-07-09 (×3): qty 1

## 2013-07-09 MED ORDER — LISINOPRIL 10 MG PO TABS
10.0000 mg | ORAL_TABLET | Freq: Every day | ORAL | Status: DC
  Administered 2013-07-10: 10 mg via ORAL
  Filled 2013-07-09: qty 1

## 2013-07-09 MED ORDER — SODIUM CHLORIDE 0.9 % IV SOLN
10.0000 mg | INTRAVENOUS | Status: DC | PRN
  Administered 2013-07-09: 50 ug/min via INTRAVENOUS

## 2013-07-09 MED ORDER — DOCUSATE SODIUM 100 MG PO CAPS: 100.0000 mg | ORAL_CAPSULE | Freq: Every day | ORAL | Status: DC

## 2013-07-09 MED ORDER — DEXAMETHASONE SODIUM PHOSPHATE 10 MG/ML IJ SOLN
INTRAMUSCULAR | Status: DC | PRN
  Administered 2013-07-09: 10 mg via INTRAVENOUS

## 2013-07-09 MED ORDER — NEOSTIGMINE METHYLSULFATE 1 MG/ML IJ SOLN
INTRAMUSCULAR | Status: DC | PRN
  Administered 2013-07-09: 3 mg via INTRAVENOUS

## 2013-07-09 MED ORDER — BUPROPION HCL ER (SR) 150 MG PO TB12
150.0000 mg | ORAL_TABLET | Freq: Every day | ORAL | Status: DC
  Administered 2013-07-09 – 2013-07-10 (×2): 150 mg via ORAL
  Filled 2013-07-09 (×2): qty 1

## 2013-07-09 MED ORDER — CEFAZOLIN SODIUM 1-5 GM-% IV SOLN
1.0000 g | Freq: Three times a day (TID) | INTRAVENOUS | Status: AC
  Administered 2013-07-09 – 2013-07-10 (×2): 1 g via INTRAVENOUS
  Filled 2013-07-09 (×2): qty 50

## 2013-07-09 MED ORDER — CHLORHEXIDINE GLUCONATE CLOTH 2 % EX PADS
6.0000 | MEDICATED_PAD | Freq: Every day | CUTANEOUS | Status: DC
  Administered 2013-07-09 – 2013-07-10 (×2): 6 via TOPICAL

## 2013-07-09 MED ORDER — HEPARIN SODIUM (PORCINE) 5000 UNIT/ML IJ SOLN
5000.0000 [IU] | Freq: Three times a day (TID) | INTRAMUSCULAR | Status: DC
  Administered 2013-07-10 (×2): 5000 [IU] via SUBCUTANEOUS
  Filled 2013-07-09 (×4): qty 1

## 2013-07-09 MED ORDER — PROPOFOL 10 MG/ML IV BOLUS
INTRAVENOUS | Status: AC
  Filled 2013-07-09: qty 20

## 2013-07-09 MED ORDER — CEFAZOLIN SODIUM-DEXTROSE 2-3 GM-% IV SOLR
INTRAVENOUS | Status: DC | PRN
  Administered 2013-07-09: 2 g via INTRAVENOUS

## 2013-07-09 MED ORDER — GELATIN ABSORBABLE MT POWD
OROMUCOSAL | Status: DC | PRN
  Administered 2013-07-09: 12:00:00 via TOPICAL

## 2013-07-09 MED ORDER — GLYCOPYRROLATE 0.2 MG/ML IJ SOLN
INTRAMUSCULAR | Status: AC
  Filled 2013-07-09: qty 4

## 2013-07-09 MED ORDER — SODIUM CHLORIDE 0.9 % IV SOLN
INTRAVENOUS | Status: DC
  Administered 2013-07-10: 08:00:00 via INTRAVENOUS

## 2013-07-09 MED ORDER — MIDAZOLAM HCL 5 MG/5ML IJ SOLN
INTRAMUSCULAR | Status: DC | PRN
  Administered 2013-07-09: 2 mg via INTRAVENOUS

## 2013-07-09 MED ORDER — OXYCODONE HCL 5 MG PO TABS: 5.0000 mg | ORAL_TABLET | Freq: Once | ORAL | Status: DC | PRN

## 2013-07-09 MED ORDER — METOCLOPRAMIDE HCL 5 MG/ML IJ SOLN
INTRAMUSCULAR | Status: DC | PRN
  Administered 2013-07-09: 10 mg via INTRAVENOUS

## 2013-07-09 MED ORDER — LACTATED RINGERS IV SOLN
INTRAVENOUS | Status: DC | PRN
  Administered 2013-07-09 (×2): via INTRAVENOUS

## 2013-07-09 MED ORDER — HYDROMORPHONE HCL PF 1 MG/ML IJ SOLN
INTRAMUSCULAR | Status: AC
  Administered 2013-07-09: 0.5 mg via INTRAVENOUS
  Filled 2013-07-09: qty 1

## 2013-07-09 MED ORDER — LIDOCAINE-EPINEPHRINE 1 %-1:100000 IJ SOLN
INTRAMUSCULAR | Status: DC | PRN
  Administered 2013-07-09: 4 mL via INTRADERMAL

## 2013-07-09 MED ORDER — PHENOL 1.4 % MT LIQD: 1.0000 | OROMUCOSAL | Status: DC | PRN

## 2013-07-09 MED ORDER — PROPOFOL 10 MG/ML IV BOLUS
INTRAVENOUS | Status: DC | PRN
  Administered 2013-07-09: 200 mg via INTRAVENOUS

## 2013-07-09 MED ORDER — ACETAMINOPHEN 325 MG PO TABS
ORAL_TABLET | ORAL | Status: AC
Start: 2013-07-09 — End: 2013-07-10
  Filled 2013-07-09: qty 2

## 2013-07-09 MED ORDER — THROMBIN 20000 UNITS EX KIT
PACK | CUTANEOUS | Status: DC | PRN
  Administered 2013-07-09: 12:00:00 via TOPICAL

## 2013-07-09 MED ORDER — METOCLOPRAMIDE HCL 5 MG/ML IJ SOLN: 10.0000 mg | Freq: Once | INTRAMUSCULAR | Status: DC | PRN

## 2013-07-09 MED ORDER — MELOXICAM 15 MG PO TABS
15.0000 mg | ORAL_TABLET | Freq: Every day | ORAL | Status: DC
  Administered 2013-07-10: 15 mg via ORAL
  Filled 2013-07-09: qty 1

## 2013-07-09 MED ORDER — PANTOPRAZOLE SODIUM 40 MG PO TBEC
40.0000 mg | DELAYED_RELEASE_TABLET | Freq: Every day | ORAL | Status: DC
  Administered 2013-07-10: 40 mg via ORAL
  Filled 2013-07-09: qty 1

## 2013-07-09 MED ORDER — ROCURONIUM BROMIDE 50 MG/5ML IV SOLN
INTRAVENOUS | Status: AC
  Filled 2013-07-09: qty 1

## 2013-07-09 MED ORDER — NEOSTIGMINE METHYLSULFATE 1 MG/ML IJ SOLN
INTRAMUSCULAR | Status: AC
  Filled 2013-07-09: qty 10

## 2013-07-09 MED ORDER — FENTANYL CITRATE 0.05 MG/ML IJ SOLN
INTRAMUSCULAR | Status: AC
  Filled 2013-07-09: qty 5

## 2013-07-09 MED ORDER — SODIUM CHLORIDE 0.9 % IV SOLN: 250.0000 mL | INTRAVENOUS | Status: DC

## 2013-07-09 MED ORDER — METFORMIN HCL 500 MG PO TABS
500.0000 mg | ORAL_TABLET | Freq: Two times a day (BID) | ORAL | Status: DC
  Administered 2013-07-10 (×2): 500 mg via ORAL
  Filled 2013-07-09 (×4): qty 1

## 2013-07-09 MED ORDER — SODIUM CHLORIDE 0.9 % IR SOLN
Status: DC | PRN
  Administered 2013-07-09: 12:00:00

## 2013-07-09 MED ORDER — DEXAMETHASONE SODIUM PHOSPHATE 10 MG/ML IJ SOLN
INTRAMUSCULAR | Status: AC
  Filled 2013-07-09: qty 1

## 2013-07-09 MED ORDER — 0.9 % SODIUM CHLORIDE (POUR BTL) OPTIME
TOPICAL | Status: DC | PRN
  Administered 2013-07-09: 1000 mL

## 2013-07-09 MED ORDER — SODIUM CHLORIDE 0.9 % IJ SOLN: 3.0000 mL | Freq: Two times a day (BID) | INTRAMUSCULAR | Status: DC

## 2013-07-09 MED ORDER — HYDROMORPHONE HCL PF 1 MG/ML IJ SOLN
0.2500 mg | INTRAMUSCULAR | Status: DC | PRN
  Administered 2013-07-09 (×2): 0.5 mg via INTRAVENOUS

## 2013-07-09 SURGICAL SUPPLY — 72 items
ADH SKN CLS APL DERMABOND .7 (GAUZE/BANDAGES/DRESSINGS) ×1
APL SKNCLS STERI-STRIP NONHPOA (GAUZE/BANDAGES/DRESSINGS) ×1
BAG DECANTER FOR FLEXI CONT (MISCELLANEOUS) ×2 IMPLANT
BANDAGE GAUZE ELAST BULKY 4 IN (GAUZE/BANDAGES/DRESSINGS) IMPLANT
BENZOIN TINCTURE PRP APPL 2/3 (GAUZE/BANDAGES/DRESSINGS) ×2 IMPLANT
BLADE SURG 11 STRL SS (BLADE) ×2 IMPLANT
BLADE SURG ROTATE 9660 (MISCELLANEOUS) IMPLANT
BUR DRUM 4.0 (BURR) IMPLANT
BUR MATCHSTICK NEURO 3.0 LAGG (BURR) ×2 IMPLANT
CAGE PEEK VISTA-S 11X14X5MM (Cage) ×1 IMPLANT
CAGE PEEK VISTA-S CERV 11X14X5 (Cage) ×1 IMPLANT
CANISTER SUCT 3000ML (MISCELLANEOUS) ×2 IMPLANT
CONT SPEC 4OZ CLIKSEAL STRL BL (MISCELLANEOUS) ×2 IMPLANT
DECANTER SPIKE VIAL GLASS SM (MISCELLANEOUS) ×2 IMPLANT
DERMABOND ADVANCED (GAUZE/BANDAGES/DRESSINGS) ×1
DERMABOND ADVANCED .7 DNX12 (GAUZE/BANDAGES/DRESSINGS) IMPLANT
DRAIN CHANNEL 10M FLAT 3/4 FLT (DRAIN) IMPLANT
DRAPE C-ARM 42X72 X-RAY (DRAPES) ×4 IMPLANT
DRAPE LAPAROTOMY 100X72 PEDS (DRAPES) ×2 IMPLANT
DRAPE MICROSCOPE LEICA (MISCELLANEOUS) ×2 IMPLANT
DRAPE POUCH INSTRU U-SHP 10X18 (DRAPES) ×2 IMPLANT
DRAPE PROXIMA HALF (DRAPES) IMPLANT
DRSG OPSITE POSTOP 3X4 (GAUZE/BANDAGES/DRESSINGS) ×1 IMPLANT
DURAPREP 6ML APPLICATOR 50/CS (WOUND CARE) ×2 IMPLANT
ELECT COATED BLADE 2.86 ST (ELECTRODE) ×2 IMPLANT
ELECT REM PT RETURN 9FT ADLT (ELECTROSURGICAL) ×2
ELECTRODE REM PT RTRN 9FT ADLT (ELECTROSURGICAL) ×1 IMPLANT
EVACUATOR SILICONE 100CC (DRAIN) IMPLANT
GAUZE SPONGE 4X4 16PLY XRAY LF (GAUZE/BANDAGES/DRESSINGS) IMPLANT
GLOVE BIO SURGEON STRL SZ 6.5 (GLOVE) ×2 IMPLANT
GLOVE BIO SURGEON STRL SZ7 (GLOVE) ×2 IMPLANT
GLOVE ECLIPSE 7.0 STRL STRAW (GLOVE) ×2 IMPLANT
GLOVE EXAM NITRILE LRG STRL (GLOVE) IMPLANT
GLOVE EXAM NITRILE MD LF STRL (GLOVE) IMPLANT
GLOVE EXAM NITRILE XL STR (GLOVE) IMPLANT
GLOVE EXAM NITRILE XS STR PU (GLOVE) IMPLANT
GLOVE INDICATOR 7.5 STRL GRN (GLOVE) ×2 IMPLANT
GOWN BRE IMP SLV AUR LG STRL (GOWN DISPOSABLE) ×4 IMPLANT
GOWN BRE IMP SLV AUR XL STRL (GOWN DISPOSABLE) IMPLANT
GOWN L4 LG 24 PK N/S (GOWN DISPOSABLE) ×3 IMPLANT
GOWN STRL REIN 2XL LVL4 (GOWN DISPOSABLE) IMPLANT
HEMOSTAT POWDER KIT SURGIFOAM (HEMOSTASIS) ×2 IMPLANT
KIT BASIN OR (CUSTOM PROCEDURE TRAY) ×2 IMPLANT
KIT ROOM TURNOVER OR (KITS) ×2 IMPLANT
NDL HYPO 25X1 1.5 SAFETY (NEEDLE) ×1 IMPLANT
NDL SPNL 22GX3.5 QUINCKE BK (NEEDLE) ×1 IMPLANT
NEEDLE HYPO 25X1 1.5 SAFETY (NEEDLE) ×2 IMPLANT
NEEDLE SPNL 22GX3.5 QUINCKE BK (NEEDLE) ×2 IMPLANT
NS IRRIG 1000ML POUR BTL (IV SOLUTION) ×2 IMPLANT
PACK LAMINECTOMY NEURO (CUSTOM PROCEDURE TRAY) ×2 IMPLANT
PAD ARMBOARD 7.5X6 YLW CONV (MISCELLANEOUS) ×6 IMPLANT
PLATE INVIZIA 2 LEV 42MM (Plate) ×1 IMPLANT
PUTTY BONE GRAFT KIT 2.5ML (Bone Implant) ×1 IMPLANT
RUBBERBAND STERILE (MISCELLANEOUS) ×4 IMPLANT
SCREW 12MM (Screw) ×10 IMPLANT
SCREW BN 12X4.2XST VA NS (Screw) IMPLANT
SCREW SELF DRILL VAR 12MM (Screw) ×1 IMPLANT
SPONGE INTESTINAL PEANUT (DISPOSABLE) ×2 IMPLANT
SPONGE SURGIFOAM ABS GEL 100 (HEMOSTASIS) ×2 IMPLANT
STRIP CLOSURE SKIN 1/2X4 (GAUZE/BANDAGES/DRESSINGS) ×2 IMPLANT
SUT ETHILON 3 0 FSL (SUTURE) IMPLANT
SUT SILK 3 0 TIES 10X30 (SUTURE) ×1 IMPLANT
SUT VIC AB 0 CT1 27 (SUTURE)
SUT VIC AB 0 CT1 27XBRD ANTBC (SUTURE) IMPLANT
SUT VIC AB 3-0 FS2 27 (SUTURE) ×2 IMPLANT
SUT VIC AB 3-0 SH 8-18 (SUTURE) ×2 IMPLANT
SUT VICRYL 3-0 RB1 18 ABS (SUTURE) ×1 IMPLANT
SYR 20ML ECCENTRIC (SYRINGE) ×2 IMPLANT
TAPE CLOTH 3X10 TAN LF (GAUZE/BANDAGES/DRESSINGS) ×2 IMPLANT
TOWEL OR 17X24 6PK STRL BLUE (TOWEL DISPOSABLE) ×2 IMPLANT
TOWEL OR 17X26 10 PK STRL BLUE (TOWEL DISPOSABLE) ×2 IMPLANT
WATER STERILE IRR 1000ML POUR (IV SOLUTION) ×2 IMPLANT

## 2013-07-09 NOTE — Anesthesia Preprocedure Evaluation (Addendum)
Anesthesia Evaluation  Patient identified by MRN, date of birth, ID band Patient awake    Reviewed: Allergy & Precautions, H&P , NPO status , Patient's Chart, lab work & pertinent test results, reviewed documented beta blocker date and time   Airway Mallampati: II TM Distance: >3 FB Neck ROM: full    Dental   Pulmonary neg pulmonary ROS,  breath sounds clear to auscultation        Cardiovascular hypertension, On Medications Rhythm:regular     Neuro/Psych negative neurological ROS  negative psych ROS   GI/Hepatic negative GI ROS, Neg liver ROS,   Endo/Other  diabetes, Oral Hypoglycemic Agents  Renal/GU Renal disease  negative genitourinary   Musculoskeletal   Abdominal   Peds  Hematology negative hematology ROS (+)   Anesthesia Other Findings See surgeon's H&P   Reproductive/Obstetrics negative OB ROS                          Anesthesia Physical Anesthesia Plan  ASA: III and emergent  Anesthesia Plan: General   Post-op Pain Management:    Induction: Intravenous  Airway Management Planned: Oral ETT and Video Laryngoscope Planned  Additional Equipment:   Intra-op Plan:   Post-operative Plan: Extubation in OR  Informed Consent: I have reviewed the patients History and Physical, chart, labs and discussed the procedure including the risks, benefits and alternatives for the proposed anesthesia with the patient or authorized representative who has indicated his/her understanding and acceptance.   Dental Advisory Given  Plan Discussed with: CRNA and Surgeon  Anesthesia Plan Comments:         Anesthesia Quick Evaluation

## 2013-07-09 NOTE — Transfer of Care (Signed)
Immediate Anesthesia Transfer of Care Note  Patient: Grace Stokes  Procedure(s) Performed: Procedure(s) with comments: ANTERIOR CERVICAL DECOMPRESSION/DISCECTOMY FUSION 2 LEVELS (N/A) - Anterior Cervical Fusion Cervical five-six, six-seven.  Patient Location: PACU  Anesthesia Type:General  Level of Consciousness: awake  Airway & Oxygen Therapy: Patient Spontanous Breathing and Patient connected to nasal cannula oxygen  Post-op Assessment: Report given to PACU RN and Post -op Vital signs reviewed and stable  Post vital signs: Reviewed and stable  Complications: No apparent anesthesia complications

## 2013-07-09 NOTE — Progress Notes (Signed)
Called Dr. Hal Neer regarding need for soft cervical collar post surgery.  Order received.

## 2013-07-09 NOTE — Preoperative (Signed)
Beta Blockers   Reason not to administer Beta Blockers:Not Applicable 

## 2013-07-09 NOTE — Anesthesia Postprocedure Evaluation (Signed)
Anesthesia Post Note  Patient: Grace Stokes  Procedure(s) Performed: Procedure(s) (LRB): ANTERIOR CERVICAL DECOMPRESSION/DISCECTOMY FUSION 2 LEVELS (N/A)  Anesthesia type: General  Patient location: PACU  Post pain: Pain level controlled  Post assessment: Patient's Cardiovascular Status Stable  Last Vitals:  Filed Vitals:   07/09/13 1545  BP: 150/82  Pulse:   Temp:   Resp:     Post vital signs: Reviewed and stable  Level of consciousness: alert  Complications: No apparent anesthesia complications

## 2013-07-09 NOTE — Anesthesia Procedure Notes (Signed)
Procedure Name: Intubation Date/Time: 07/09/2013 11:39 AM Performed by: Neldon Newport Pre-anesthesia Checklist: Patient identified, Timeout performed, Emergency Drugs available, Suction available and Patient being monitored Patient Re-evaluated:Patient Re-evaluated prior to inductionOxygen Delivery Method: Circle system utilized Preoxygenation: Pre-oxygenation with 100% oxygen Intubation Type: IV induction Ventilation: Mask ventilation without difficulty Grade View: Grade I Tube type: Oral Tube size: 7.5 mm Number of attempts: 1 Airway Equipment and Method: Video-laryngoscopy Placement Confirmation: ETT inserted through vocal cords under direct vision,  positive ETCO2 and breath sounds checked- equal and bilateral Secured at: 22 cm Tube secured with: Tape Dental Injury: Teeth and Oropharynx as per pre-operative assessment

## 2013-07-09 NOTE — Progress Notes (Signed)
Pt seen and examined in PACU.  EXAM:  BP 134/75  Pulse 94  Temp(Src) 98.6 F (37 C) (Oral)  Resp 16  Ht 5\' 2"  (1.575 m)  Wt 71.215 kg (157 lb)  BMI 28.71 kg/m2  SpO2 98%  LMP 04/06/2011  Awake, alert, oriented  Speech fluent, appropriate  CN grossly intact  Good strength BUE Baseline strength with mild proximal weakness BLE  IMPRESSION:  58 y.o. female immediate postop from C5-6, C6-7 ACDF, at baseline  PLAN: - Cont routine postop care - Will get PT/OT tomorrow

## 2013-07-09 NOTE — Progress Notes (Signed)
Orthopedic Tech Progress Note Patient Details:  Grace Stokes 1956/01/05 [id number]  Ortho Devices Type of Ortho Device: Soft collar Ortho Device/Splint Location: neck Ortho Device/Splint Interventions: Application   Hildred Priest 07/09/2013, 6:26 PM

## 2013-07-09 NOTE — H&P (Signed)
CC:  Chief Complaint  Patient presents with  . Leg Pain    HPI: Grace Stokes is a 58 y.o. female who presented to the emergency department yesterday complaining of several days of hand numbness after a fall in her bathroom on Thursday. She says over the past 3-4 weeks she has actually been suffering from significant imbalance. She says her legs feel like they are "heavy". Upon further questioning, she also says that even before this feeling of heaviness in her legs, she did have occasions where her legs gave out on her and she is fallen several times in the recent past.  After her fall her bathroom, she says her both hands have felt tingling, and she has had difficulty using and. This feeling persisted over a couple days and prompted her to seek medical attention. She also does say that she is essentially incontinence of urine since childhood, and says she has had "tubes placed from her kidneys to her bladder."  PMH: Past Medical History  Diagnosis Date  . Chronic pain   . Renal disorder   . Scoliosis   . Toxemia in pregnancy   . Arthritis   . Bell's palsy   . Cataract     PSH: Past Surgical History  Procedure Laterality Date  . Back surgery    . Cesarean section    . Tubal ligation    . Shoulder surgery    . Rotator cuff repair    . Carpal tunnel release      SH: History  Substance Use Topics  . Smoking status: Never Smoker   . Smokeless tobacco: Not on file  . Alcohol Use: No    MEDS: Prior to Admission medications   Medication Sig Start Date End Date Taking? Authorizing Provider  amLODipine (NORVASC) 10 MG tablet Take 10 mg by mouth daily.   Yes Historical Provider, MD  buPROPion (WELLBUTRIN SR) 150 MG 12 hr tablet Take 150 mg by mouth daily.   Yes Historical Provider, MD  docusate sodium (COLACE) 100 MG capsule Take 100 mg by mouth daily.   Yes Historical Provider, MD  lisinopril (PRINIVIL,ZESTRIL) 10 MG tablet Take 10 mg by mouth daily.   Yes Historical  Provider, MD  meloxicam (MOBIC) 15 MG tablet Take 15 mg by mouth daily.   Yes Historical Provider, MD  metFORMIN (GLUCOPHAGE) 500 MG tablet Take 500 mg by mouth 2 (two) times daily with a meal.   Yes Historical Provider, MD  pantoprazole (PROTONIX) 40 MG tablet Take 40 mg by mouth daily.   Yes Historical Provider, MD  pramipexole (MIRAPEX) 1 MG tablet Take 1 mg by mouth daily.   Yes Historical Provider, MD  pravastatin (PRAVACHOL) 20 MG tablet Take 20 mg by mouth daily.   Yes Historical Provider, MD  tiZANidine (ZANAFLEX) 4 MG tablet Take 4 mg by mouth daily.    Yes Historical Provider, MD  topiramate (TOPAMAX) 100 MG tablet Take 100 mg by mouth daily.    Yes Historical Provider, MD    ALLERGY: No Known Allergies  ROS: Review of Systems  Constitutional: Negative for fever and chills.  HENT: Positive for sore throat. Negative for hearing loss.   Respiratory: Negative for shortness of breath.   Cardiovascular: Negative for chest pain.  Gastrointestinal: Negative for nausea and vomiting.  Genitourinary: Positive for urgency.  Musculoskeletal: Positive for back pain, falls, joint pain and neck pain.  Skin: Negative for rash.  Neurological: Positive for tingling, sensory change, focal weakness and weakness.  Negative for headaches.  Psychiatric/Behavioral: Negative for memory loss.    NEUROLOGIC EXAM: Awake, alert, oriented Memory and concentration grossly intact Speech fluent, appropriate CN grossly intact Motor exam: Upper Extremities Deltoid Bicep Tricep Grip  Right 5/5 5/5 5/5 5/5  Left 5/5 5/5 5/5 5/5    4+/5 Right wrist ext, 4/5 left wrist ext  Lower Extremity IP Quad PF DF EHL  Right 4/5 4+/5 5/5 5/5 5/5  Left 4/5 4+/5 5/5 5/5 5/5   Weak Right Hoffman's, (-) Left Hoffman's  IMGAING: MRI cervical spine demonstrates hyperlordotic curvature of the cervical spine which may be positional. There is normal alignment of the cervical vertebrae. There is a large central disc  herniation at C5-6 with cord compression and T2 signal change within the cord at this level. There is also a eccentric disc herniation at C6-7 with effacement of the ventral CSF space and perhaps slight cord compression at this level also.  IMPRESSION: - 58 y.o. female with chronic myelopathy and likely mild central cord syndrome do to recent fall, with MRI demonstrating significant C5-6 and C6-7 cord compression with T2 signal change  PLAN: - Proceed with surgical decompression via anterior cervical discectomy and fusion at C5-6 and C6-7  The MRI results and correlation with her symptoms were discussed in detail with the patient and her husband. I explained to them that were imbalance as well as her hand symptoms were likely related to her underlying cervical pathology. Given the degree of cord compression I recommended the above surgical decompression. The risks of the surgery were explained in detail including the risk of postoperative hoarseness, dysphagia, spinal cord injury causing worsening weakness and or bowel or bladder dysfunction, bleeding, infection. The risk of postoperative neck hematoma or reoperation was also discussed. In addition, I explained to them that after surgery, it would likely be a long process to recovery given the chronicity of her symptoms. The patient and her husband understood our discussion and are willing to proceed with surgery. All their questions were answered.

## 2013-07-09 NOTE — Progress Notes (Signed)
Pt received back from PACU via bed, vital signs stable and pt is comfortable, awake and alert.  No c/o of any new numbness.  Pt thirsty.  Will start on a soft diet.  Honeycomb anterior neck dressing is clean, dry, intact.

## 2013-07-09 NOTE — Evaluation (Signed)
Occupational Therapy Evaluation Patient Details Name: Grace Stokes MRN: [id number] DOB: 08/04/1955 Today's Date: 07/09/2013    History of Present Illness 58 y.o. s/p ANTERIOR CERVICAL DECOMPRESSION/DISCECTOMY FUSION 2 LEVELS (N/A)57 y.o. S/p    Clinical Impression   Pt presents with below problem list. Pt independent with ADLs (up until a few days ago), PTA. Feel pt will benefit from acute OT to increase independence prior to d/c.     Follow Up Recommendations  No OT follow up;Supervision - Intermittent (when OOB/mobility)    Equipment Recommendations  None recommended by OT    Recommendations for Other Services       Precautions / Restrictions Precautions Precautions: Cervical;Fall Precaution Comments: Reviewed precautions with pt      Mobility Bed Mobility Overal bed mobility: Needs Assistance Bed Mobility: Rolling;Sidelying to Sit;Sit to Sidelying Rolling: Min guard Sidelying to sit: Min guard     Sit to sidelying: Min guard General bed mobility comments: cues for log roll technique.  Transfers Overall transfer level: Needs assistance   Transfers: Sit to/from Stand Sit to Stand: Min guard         General transfer comment: cues for hand placement/technique    Balance                                    ADL   Grooming: Oral care;Wash/dry hands;Set up;Supervision/safety   Upper Body Dressing : Set up;Supervision/safety;Sitting   Lower Body Dressing: Min guard;Sit to/from stand Toilet Transfer: Min guard;Ambulation;Comfort height toilet;Grab bars;RW;Minimal assistance Toileting- Clothing Manipulation and Hygiene: Min guard;Sitting/lateral lean (clothing-standing)   Functional mobility during ADLs: Minimal assistance;Min guard General ADL Comments: Pt able to cross legs over knees for LB dressing and educated this is also how she would perform bathing. Recommended sitting on chair for bathing. Discussed safe shoewear and use of bag on  walker to carry items. Recommended spouse pick up rugs in house for safety. Discussed use of cup/straw to help maintain precautions.  Discussed shower transfer and pt says walker will not fit in shower, so recommended sponge bathing.  Pt did take a few steps with OT without walker, and pt more steady with walker.      Vision                     Perception     Praxis      Pertinent Vitals/Pain Pain in back. Repositioned.      Hand Dominance     Extremity/Trunk Assessment Upper Extremity Assessment Upper Extremity Assessment: RUE deficits/detail RUE Coordination: decreased fine motor (slight)   Lower Extremity Assessment Lower Extremity Assessment: Defer to PT evaluation   Cervical / Trunk Assessment Cervical / Trunk Assessment: Kyphotic   Communication Communication Communication: No difficulties   Cognition Arousal/Alertness: Awake/alert Behavior During Therapy: WFL for tasks assessed/performed Overall Cognitive Status: Within Functional Limits for tasks assessed                     General Comments       Exercises      Home Living Family/patient expects to be discharged to:: Private residence Living Arrangements: Spouse/significant other Available Help at Discharge: Family;Available PRN/intermittently Type of Home: House Home Access: Ramped entrance     Home Layout: One level     Bathroom Shower/Tub: Occupational psychologist: Handicapped height     Home Equipment: Environmental consultant - 2 wheels;Walker -  4 wheels;Bedside commode;Wheelchair - Education administrator (comment);Shower seat - built in Brewing technologist)          Prior Functioning/Environment Level of Independence: Independent with assistive device(s)        Comments: Independent until a few days ago needed assistance with LB ADLs.    OT Diagnosis:     OT Problem List: Impaired balance (sitting and/or standing);Decreased knowledge of use of DME or AE;Impaired sensation;Pain;Decreased  knowledge of precautions;Decreased coordination;Decreased strength   OT Treatment/Interventions: Self-care/ADL training;DME and/or AE instruction;Therapeutic activities;Patient/family education;Balance training    OT Goals(Current goals can be found in the care plan section) Acute Rehab OT Goals Patient Stated Goal: pt says she does not have goal OT Goal Formulation: With patient Time For Goal Achievement: 07/16/13 Potential to Achieve Goals: Good ADL Goals Pt Will Perform Grooming: with modified independence;standing Pt Will Perform Lower Body Dressing: with modified independence;sit to/from stand Pt Will Transfer to Toilet: with modified independence;ambulating;grab bars;bedside commode (comfort height toilet) Pt Will Perform Toileting - Clothing Manipulation and hygiene: with modified independence;sit to/from stand  OT Frequency: Min 2X/week   Barriers to D/C: Decreased caregiver support          End of Session: Equipment Utilized During Treatment: Gait belt;Rolling walker  Activity Tolerance: Patient tolerated treatment well Patient left: in bed;with call bell/phone within reach   Time: 1742-1810 OT Time Calculation (min): 28 min Charges:  OT General Charges $OT Visit: 1 Procedure OT Evaluation $Initial OT Evaluation Tier I: 1 Procedure OT Treatments $Self Care/Home Management : 8-22 mins G-Codes: OT G-codes **NOT FOR INPATIENT CLASS** Functional Assessment Tool Used: clinical judgment Functional Limitation: Self care Self Care Current Status (C6237): At least 1 percent but less than 20 percent impaired, limited or restricted Self Care Goal Status (S2831): 0 percent impaired, limited or restricted  Benito Mccreedy OTR/L [phone] 07/09/2013, 6:43 PM

## 2013-07-09 NOTE — Op Note (Signed)
PREOP DIAGNOSIS: Cervical Spondylosis with myelopathy, C5-6, C6-7  POSTOP DIAGNOSIS: Same  PROCEDURE: 1. Discectomy at C5-6, C6-7 for decompression of spinal cord and exiting nerve roots  2. Placement of intervertebral biomechanical device, C5-6, C6-7: Zimmer PEEK graft 3. Placement of anterior instrumentation consisting of interbody plate and screws spanning C5-C7 4. Use of morselized bone allograft  5. Arthrodesis C5-6, C6-7, anterior interbody technique  6. Use of intraoperative microscope  SURGEON: Dr. Consuella Lose, MD  ASSISTANT: None  ANESTHESIA: General Endotracheal  EBL: 100 cc  SPECIMENS: None  DRAINS: None  COMPLICATIONS: None immediate  CONDITION: Hemodynamically stable to post anesthesia care unit  HISTORY: Grace Stokes is a 58 y.o. who presented to the emergency department with several weeks of gait instability and a recent fall causing bilateral hand numbness. Given these findings, MRI of the cervical spine was done which demonstrated a large C5-6 and C6-7 disc herniation with cord compression and T2 signal change in the spinal cord. With these findings, surgical decompression was indicated. The risks and benefits of the surgery were explained in detail to the patient and her husband. After all their questions were answered, verbal and written consent was obtained and placed placed in the chart.  PROCEDURE IN DETAIL: The patient was brought to the operating room and transferred to the operative table. After induction of general anesthesia, the patient was positioned on the operative table in the supine position with all pressure points meticulously padded. The skin of the neck was then prepped and draped in the usual sterile fashion.  After timeout was conducted, the skin was infiltrated with local anesthetic. Skin incision was then made sharply and Bovie electrocautery was used to dissect the subcutaneous tissue until the platysma was identified. The platysma  was then divided and undermined. The sternocleidomastoid muscle was then identified and, utilizing natural fascial planes in the neck, the prevertebral fascia was identified and the carotid sheath was retracted laterally and the trachea and esophagus retracted medially. Again using fluoroscopy, the correct disc spaces were identified. The superior thyroid artery was identified and found to be crossing the operative field across the C6 body. The decision was made to ligate this vessel  in order to prevent avulsion from the external carotid artery. Bovie electrocautery was used to dissect in the subperiosteal plane and elevate the bilateral longus coli muscles. Self-retaining retractors were then placed. At this point, the microscope was draped and brought into the field, and the remainder of the case was done under the microscope using microdissecting technique.  The C5-6 disc space was incised sharply and rongeurs were use to initially complete a discectomy. The high-speed drill was then used to complete discectomy until the posterior annulus was identified and removed and the posterior longitudinal ligament was identified. Using a nerve hook, the PLL was elevated, and Kerrison rongeurs were used to remove the posterior longitudinal ligament and the ventral thecal sac was identified. Using a combination of curettes and rongeurs, complete decompression of the thecal sac and exiting nerve roots at this level was completed, and verified using micro-nerve hook.  At this point, a 5 mm lordotic interbody cage was sized and packed with morcellized bone allograft. This was then inserted and tapped into place.  Attention was then turned to the C6-7 level. In a similar fashion, discectomy was completed initially with curettes and rongeurs, and completed with the drill. The PLL was again identified, elevated and incised. Using Kerrison rongeurs, decompression of the spinal cord and exiting roots at  the C6-7 level was  completed and confirmed with a dissector. There did appear to be a herniated disc fragment eccentric to the patient's left at this level.  A 5 mm parallel interbody cage was then sized and filled with bone allograft, and tapped into place. Position of the interbody devices was then confirmed with fluoroscopy.  After placement of the intervertebral devices, the anterior cervical plate was selected, and placed across the interspaces. Using a high-speed drill, the cortex of the cervical vertebral bodies was punctured, and screws inserted in the C5, C6, and C7 levels. Final fluoroscopic images in AP and lateral projections were taken to confirm good hardware placement.  At this point, after all counts were verified to be correct, meticulous hemostasis was secured using a combination of bipolar electrocautery and passive hemostatics. The platysma muscle was then closed using interrupted 3-0 Vicryl sutures, and the subcutaneous layer was closed with interrupted 3-0 Vicryl stitches. The skin was then closed using standard skin glue. Sterile dressing was then applied.  The patient tolerated the procedure well and was extubated in the room and taken to the postanesthesia care unit in stable condition.

## 2013-07-10 DIAGNOSIS — M4712 Other spondylosis with myelopathy, cervical region: Secondary | ICD-10-CM | POA: Diagnosis not present

## 2013-07-10 MED ORDER — OXYCODONE HCL 5 MG PO TABS: 5.0000 mg | ORAL_TABLET | ORAL | Status: DC | PRN

## 2013-07-10 NOTE — Care Management Note (Signed)
    Page 1 of 1   07/10/2013     4:34:28 PM   CARE MANAGEMENT NOTE 07/10/2013  Patient:  Grace Stokes, Grace Stokes   Account Number:  [id number]  Date Initiated:  07/10/2013  Documentation initiated by:  Magdalen Spatz  Subjective/Objective Assessment:     Action/Plan:   Anticipated DC Date:  07/10/2013   Anticipated DC Plan:  [place]         Choice offered to / List presented to:  C-[address] Patient        [address] arranged  [facility]      [place].   Status of service:  Completed, signed off Medicare Important Message given?   (If response is "NO", the following Medicare IM given date fields will be blank) Date Medicare IM given:   Date Additional Medicare IM given:    Discharge Disposition:    Per UR Regulation:    If discussed at Long Length of Stay Meetings, dates discussed:    Comments:

## 2013-07-10 NOTE — Evaluation (Signed)
Physical Therapy Evaluation Patient Details Name: Grace Stokes MRN: [id number] DOB: [birth year] Today's Date: 07/10/2013   History of Present Illness  58 y.o. s/p ANTERIOR CERVICAL DECOMPRESSION/DISCECTOMY FUSION 2 LEVELS (N/A)  Clinical Impression  Patient demonstrates deficits in functional mobility as indicated below. Will benefit from continued skilled PT to address deficits and maximize function. Will see as indicated and progress as tolerated. Patient may benefit from trial of HHPT for balance and safety assessment secondary to history of falls.    Follow Up Recommendations Home health PT;Supervision/Assistance - 24 hour    Equipment Recommendations   (pt states she has equipment)    Recommendations for Other Services       Precautions / Restrictions Precautions Precautions: Cervical;Fall Precaution Comments: patient verbalized precautions as taught by OT yesterday      Mobility  Bed Mobility Overal bed mobility: Needs Assistance Bed Mobility: Rolling;Sidelying to Sit Rolling: Min guard Sidelying to sit: Min guard       General bed mobility comments: cues for log roll technique.  Transfers Overall transfer level: Needs assistance Equipment used: Rolling walker (2 wheeled) Transfers: Sit to/from Stand Sit to Stand: Min guard         General transfer comment: Performed from bed as well as toilet  Ambulation/Gait Ambulation/Gait assistance: Supervision;Min guard Ambulation Distance (Feet): 180 Feet Assistive device: Rolling walker (2 wheeled) Gait Pattern/deviations: Step-through pattern;Decreased stride length;Trunk flexed (significantly kyphotic and flexed at baseline (scholiosis))   Gait velocity interpretation: <1.8 ft/sec, indicative of risk for recurrent falls General Gait Details: VCs for cadence and cues for neutral head posture during ambulation (this causes patient to have a downward cervical flexion secondary to severe kyphosis)  Stairs             Wheelchair Mobility    Modified Rankin (Stroke Patients Only)       Balance Overall balance assessment: History of Falls                                   Pertinent Vitals/Pain Patient states no pain at this time    Home Living Family/patient expects to be discharged to:: Private residence Living Arrangements: Spouse/significant other Available Help at Discharge: Family;Available PRN/intermittently Type of Home: House Home Access: Ramped entrance     Home Layout: One level Home Equipment: Walker - 2 wheels;Walker - 4 wheels;Bedside commode;Wheelchair - Education administrator (comment);Shower seat - built in Brewing technologist)      Prior Function Level of Independence: Independent with assistive device(s)         Comments: Independent until a few days ago needed assistance with LB ADLs.     Hand Dominance        Extremity/Trunk Assessment               Lower Extremity Assessment: Generalized weakness      Cervical / Trunk Assessment: Kyphotic (scholiosis)  Communication   Communication: No difficulties  Cognition Arousal/Alertness: Awake/alert Behavior During Therapy: WFL for tasks assessed/performed Overall Cognitive Status: Within Functional Limits for tasks assessed                      General Comments      Exercises        Assessment/Plan    PT Assessment Patient needs continued PT services  PT Diagnosis Difficulty walking;Generalized weakness   PT Problem List Decreased strength;Decreased range of motion;Decreased activity tolerance;Decreased balance;Decreased mobility;Impaired  sensation  PT Treatment Interventions DME instruction;Gait training;Stair training;Therapeutic exercise;Therapeutic activities;Functional mobility training;Patient/family education   PT Goals (Current goals can be found in the Care Plan section) Acute Rehab PT Goals Patient Stated Goal: none stated PT Goal Formulation: With patient Time For  Goal Achievement: 07/24/13 Potential to Achieve Goals: Good    Frequency Min 4X/week   Barriers to discharge Decreased caregiver support patient spouse works, but patient does potentially have other family she can stay with initially, will need to confirm.    End of Session Equipment Utilized During Treatment: Gait belt Activity Tolerance: Patient tolerated treatment well Patient left: in chair;with call bell/phone within reach         Time: 0854-0920 PT Time Calculation (min): 26 min   Charges:   PT Evaluation $Initial PT Evaluation Tier I: 1 Procedure PT Treatments $Gait Training: 8-22 mins $Self Care/Home Management: 8-22   PT G Codes:   Functional Assessment Tool Used: clinical judgement Functional Limitation: Mobility: Walking and moving around  07/20/2013 0900  PT G-Codes **NOT FOR INPATIENT CLASS**  Functional Assessment Tool Used clinical judgement  Functional Limitation Mobility: Walking and moving around  Mobility: Walking and Moving Around Current Status (O3785) CJ  Mobility: Walking and Moving Around Goal Status (Y8502) CI      Duncan Dull Jul 20, 2013, 9:23 AM Alben Deeds, PT DPT  [phone]

## 2013-07-10 NOTE — Progress Notes (Signed)
Occupational Therapy Treatment Patient Details Name: Grace Stokes MRN: [id number] DOB: 07/07/1955 Today's Date: 07/10/2013    History of present illness 58 y.o. s/p ANTERIOR CERVICAL DECOMPRESSION/DISCECTOMY FUSION 2 LEVELS (N/A)   OT comments  Pt. Is doing well with performing LE ADLs with cross legged technique. Pt. Was able to AMB into bathroom with walker at [place] level. Pt. Was S to stand at sink for grooming. Pt. Will have husband at home to A with ADLs and mobility. Pt. Is set to d/c today.   Follow Up Recommendations       Equipment Recommendations    Pt. Has needed equipment from previous sx.    Recommendations for Other Services      Precautions / Restrictions Precautions Precautions: Cervical;Fall Precaution Comments: Pt. is able to state Restrictions Weight Bearing Restrictions: No       Mobility Bed Mobility Overal bed mobility: Modified Independent                Transfers                 General transfer comment: Pt. is Min Guard A to AMB to bathroom with walker.    Balance                                   ADL   Grooming: Wash/dry hands;Wash/dry face;Supervision/safety;Standing       Lower Body Dressing: Supervision/safety;Sit to/from stand Toilet Transfer: Min guard;Ambulation;Comfort height toilet;Grab bars;RW;Minimal assistance Toileting- Clothing Manipulation and Hygiene: Supervision/safety;Sit to/from stand   Functional mobility during ADLs: Min guard;Rolling walker General ADL Comments: Pt. is able to use cross leg technique for LE ADLs.      Vision                     Perception     Praxis      Cognition   Behavior During Therapy: [facility] for tasks assessed/performed Overall Cognitive Status: Within Functional Limits for tasks assessed                       Extremity/Trunk Assessment               Exercises       General Comments      Pertinent Vitals/ Pain       No  c/o pain.  Home Living                                          Prior Functioning/Environment              Frequency       Progress Toward Goals  OT Goals(current goals can now be found in the care plan section)  Progress towards OT goals: Progressing toward goals  Acute Rehab OT Goals Patient Stated Goal: go home today  Plan      End of Session Equipment Utilized During Treatment: Rolling walker  Activity Tolerance Patient tolerated treatment well   Patient Left in bed;with call bell/phone within reach   Nurse Communication          Time: 1342-1420 OT Time Calculation (min): 38 min  Charges: OT General Charges $OT Visit: 1 Procedure OT Evaluation $Initial OT Evaluation Tier I: 1 Procedure  Shadoe Bethel 07/10/2013, 2:16 PM

## 2013-07-10 NOTE — Discharge Planning (Signed)
Patient discharged home in stable condition. Verbalizes understanding of all discharge instructions, including home medications and follow up appointments. 

## 2013-07-10 NOTE — Discharge Summary (Signed)
Physician Discharge Summary  Patient ID: Grace Stokes MRN: [id number] DOB/AGE: 58-Aug-1957 58 y.o.  Admit date: 07/08/2013 Discharge date: 07/10/2013  Admission Diagnoses: Cervical spondylosis with myelopathy, C5-6, C6-7  Discharge Diagnoses: Same Active Problems:   Cervical myelopathy   Cervical spondylosis with myelopathy   Discharged Condition: Stable  Hospital Course:  [name] is a 58 y.o. female was admitted through the emergency department after presenting with several weeks of gait instability and a recent fall several days ago. After this fall she says she had significant numbness and tingling in her hands. She was worked up with MRI which demonstrated a large disc herniation at C5-6 and a similar herniation at C6-7 with cord compression and T2 signal change behind the cord at C5-6. Given these findings and her symptoms, surgical decompression was indicated. She underwent the procedure without complication and was returned to the floor postoperatively. She was at her neurologic baseline, and on postoperative day #1 she states she felt significantly better, with improved strength in her lower extremities and improved ability to ambulate. She also described improvement in the movement of her hands. She was tolerating diet, voiding without difficulty and pain was controlled with oral medication. She was therefore stable for discharge after she was evaluated by physical and occupational therapy.  Treatments: Surgery - C5-6, C6-7 ACDF  Discharge Exam: Blood pressure 124/64, pulse 97, temperature 98.2 F (36.8 C), temperature source Oral, resp. rate 16, height 5\' 2"  (1.575 m), weight 71.215 kg (157 lb), last menstrual period 04/06/2011, SpO2 100.00%. Awake, alert, oriented Speech fluent, appropriate CN grossly intact 5/5 BUE/BLE Wound c/d/i  Follow-up: Follow-up in my office [facility] [phone]) in 2-3 weeks  Disposition: 01-Home or Self  Care  Discharge Orders   Future Orders Complete By Expires   Face-to-face encounter (required for Medicare/Medicaid patients)  As directed    Questions:     The encounter with the patient was in whole, or in part, for the following medical condition, which is the primary reason for home health care:  Cervical spondylosis with Myelopathy   I certify that, based on my findings, the following services are medically necessary home health services:  Physical therapy   My clinical findings support the need for the above services:  Unsafe ambulation due to balance issues   Further, I certify that my clinical findings support that this patient is homebound due to:  Unsafe ambulation due to balance issues   Reason for Medically Necessary Home Health Services:  Therapy- Home [facility] to [place]  As directed    Questions:     To provide the following care/treatments:  PT       Medication List         amLODipine 10 MG tablet  Commonly known as:  NORVASC  Take 10 mg by mouth daily.     buPROPion 150 MG 12 hr tablet  Commonly known as:  WELLBUTRIN SR  Take 150 mg by mouth daily.     docusate sodium 100 MG capsule  Commonly known as:  COLACE  Take 100 mg by mouth daily.     lisinopril 10 MG tablet  Commonly known as:  PRINIVIL,ZESTRIL  Take 10 mg by mouth daily.     meloxicam 15 MG tablet  Commonly known as:  MOBIC  Take 15 mg by mouth daily.     metFORMIN 500 MG tablet  Commonly known as:  GLUCOPHAGE  Take 500 mg by  mouth 2 (two) times daily with a meal.     oxyCODONE 5 MG immediate release tablet  Commonly known as:  Oxy IR/ROXICODONE  Take 1 tablet (5 mg total) by mouth every 4 (four) hours as needed for moderate pain.     pantoprazole 40 MG tablet  Commonly known as:  PROTONIX  Take 40 mg by mouth daily.     pramipexole 1 MG tablet  Commonly known as:  MIRAPEX  Take 1 mg by mouth daily.     pravastatin 20 MG tablet  Commonly known as:  PRAVACHOL   Take 20 mg by mouth daily.     tiZANidine 4 MG tablet  Commonly known as:  ZANAFLEX  Take 4 mg by mouth daily.     topiramate 100 MG tablet  Commonly known as:  TOPAMAX  Take 100 mg by mouth daily.         SignedConsuella Lose, C 07/10/2013, 4:12 PM

## 2013-07-12 ENCOUNTER — Encounter (HOSPITAL_COMMUNITY): Payer: Self-pay | Admitting: Neurosurgery

## 2013-07-24 DIAGNOSIS — Z6829 Body mass index (BMI) 29.0-29.9, adult: Secondary | ICD-10-CM | POA: Diagnosis not present

## 2013-07-24 DIAGNOSIS — M4712 Other spondylosis with myelopathy, cervical region: Secondary | ICD-10-CM | POA: Diagnosis not present

## 2013-08-14 ENCOUNTER — Encounter (HOSPITAL_COMMUNITY): Payer: Self-pay | Admitting: Neurosurgery

## 2013-10-25 DIAGNOSIS — M4712 Other spondylosis with myelopathy, cervical region: Secondary | ICD-10-CM | POA: Diagnosis not present

## 2013-10-25 DIAGNOSIS — I10 Essential (primary) hypertension: Secondary | ICD-10-CM | POA: Diagnosis not present

## 2014-02-12 ENCOUNTER — Other Ambulatory Visit: Payer: Self-pay

## 2014-02-12 DIAGNOSIS — Z1231 Encounter for screening mammogram for malignant neoplasm of breast: Secondary | ICD-10-CM

## 2014-03-01 ENCOUNTER — Ambulatory Visit
Admission: RE | Admit: 2014-03-01 | Discharge: 2014-03-01 | Disposition: A | Discharge status: Home / Self Care | Payer: Commercial Managed Care - PPO | Source: Ambulatory Visit

## 2014-03-01 DIAGNOSIS — Z1231 Encounter for screening mammogram for malignant neoplasm of breast: Secondary | ICD-10-CM

## 2015-04-14 HISTORY — PX: COLONOSCOPY: SHX174

## 2015-11-07 DIAGNOSIS — M5441 Lumbago with sciatica, right side: Secondary | ICD-10-CM | POA: Diagnosis not present

## 2015-11-07 DIAGNOSIS — M5442 Lumbago with sciatica, left side: Secondary | ICD-10-CM | POA: Diagnosis not present

## 2015-11-07 DIAGNOSIS — E119 Type 2 diabetes mellitus without complications: Secondary | ICD-10-CM | POA: Diagnosis not present

## 2015-11-07 DIAGNOSIS — Z716 Tobacco abuse counseling: Secondary | ICD-10-CM | POA: Diagnosis not present

## 2015-11-07 DIAGNOSIS — R5383 Other fatigue: Secondary | ICD-10-CM | POA: Diagnosis not present

## 2015-11-07 DIAGNOSIS — I10 Essential (primary) hypertension: Secondary | ICD-10-CM | POA: Diagnosis not present

## 2015-11-07 DIAGNOSIS — E559 Vitamin D deficiency, unspecified: Secondary | ICD-10-CM | POA: Diagnosis not present

## 2016-01-20 ENCOUNTER — Encounter (HOSPITAL_COMMUNITY): Payer: Self-pay | Admitting: Cardiology

## 2016-01-20 ENCOUNTER — Emergency Department (HOSPITAL_COMMUNITY)
Admission: EM | Admit: 2016-01-20 | Discharge: 2016-01-20 | Disposition: A | Discharge status: Home / Self Care | Payer: Medicare Other | Attending: Emergency Medicine | Admitting: Emergency Medicine

## 2016-01-20 DIAGNOSIS — R1110 Vomiting, unspecified: Secondary | ICD-10-CM | POA: Insufficient documentation

## 2016-01-20 DIAGNOSIS — M545 Low back pain, unspecified: Secondary | ICD-10-CM

## 2016-01-20 DIAGNOSIS — F17200 Nicotine dependence, unspecified, uncomplicated: Secondary | ICD-10-CM | POA: Insufficient documentation

## 2016-01-20 DIAGNOSIS — G8929 Other chronic pain: Secondary | ICD-10-CM | POA: Diagnosis not present

## 2016-01-20 DIAGNOSIS — M5489 Other dorsalgia: Secondary | ICD-10-CM | POA: Diagnosis not present

## 2016-01-20 DIAGNOSIS — R112 Nausea with vomiting, unspecified: Secondary | ICD-10-CM | POA: Diagnosis not present

## 2016-01-20 DIAGNOSIS — I959 Hypotension, unspecified: Secondary | ICD-10-CM | POA: Diagnosis not present

## 2016-01-20 LAB — BASIC METABOLIC PANEL
Anion gap: 7 (ref 5–15)
BUN: 22 mg/dL — ABNORMAL HIGH (ref 6–20)
CO2: 24 mmol/L (ref 22–32)
CREATININE: 0.83 mg/dL (ref 0.44–1.00)
Calcium: 8.8 mg/dL — ABNORMAL LOW (ref 8.9–10.3)
Chloride: 104 mmol/L (ref 101–111)
GFR calc Af Amer: >60 mL/min (ref >60)
GFR calc non Af Amer: >60 mL/min (ref >60)
Glucose, Bld: 250 mg/dL — ABNORMAL HIGH (ref 65–99)
POTASSIUM: 3.2 mmol/L — AB (ref 3.5–5.1)
SODIUM: 135 mmol/L (ref 135–145)

## 2016-01-20 LAB — CBC
HCT: 39.5 % (ref 36.0–46.0)
Hemoglobin: 13.2 g/dL (ref 12.0–15.0)
MCH: 28.4 pg (ref 26.0–34.0)
MCHC: 33.4 g/dL (ref 30.0–36.0)
MCV: 84.9 fL (ref 78.0–100.0)
PLATELETS: 133 10*3/uL — AB (ref 150–400)
RBC: 4.65 MIL/uL (ref 3.87–5.11)
RDW: 14.8 % (ref 11.5–15.5)
WBC: 8.6 10*3/uL (ref 4.0–10.5)

## 2016-01-20 MED ORDER — SODIUM CHLORIDE 0.9 % IV SOLN
INTRAVENOUS | Status: DC
  Administered 2016-01-20: 20:00:00 via INTRAVENOUS

## 2016-01-20 MED ORDER — SODIUM CHLORIDE 0.9 % IV BOLUS (SEPSIS)
500.0000 mL | Freq: Once | INTRAVENOUS | Status: AC
  Administered 2016-01-20: 500 mL via INTRAVENOUS

## 2016-01-20 MED ORDER — NAPROXEN 500 MG PO TABS: 500.0000 mg | ORAL_TABLET | Freq: Two times a day (BID) | ORAL | 0 refills | Status: DC

## 2016-01-20 MED ORDER — ONDANSETRON HCL 4 MG/2ML IJ SOLN
4.0000 mg | Freq: Once | INTRAMUSCULAR | Status: AC
  Administered 2016-01-20: 4 mg via INTRAVENOUS
  Filled 2016-01-20: qty 2

## 2016-01-20 MED ORDER — SODIUM CHLORIDE 0.9 % IV BOLUS (SEPSIS)
1000.0000 mL | Freq: Once | INTRAVENOUS | Status: AC
  Administered 2016-01-20: 1000 mL via INTRAVENOUS

## 2016-01-20 MED ORDER — CYCLOBENZAPRINE HCL 10 MG PO TABS: 10.0000 mg | ORAL_TABLET | Freq: Two times a day (BID) | ORAL | 0 refills | Status: DC | PRN

## 2016-01-20 MED ORDER — HYDROMORPHONE HCL 1 MG/ML IJ SOLN
1.0000 mg | Freq: Once | INTRAMUSCULAR | Status: AC
  Administered 2016-01-20: 1 mg via INTRAVENOUS
  Filled 2016-01-20: qty 1

## 2016-01-20 MED ORDER — HYDROCODONE-ACETAMINOPHEN 5-325 MG PO TABS: 1.0000 | ORAL_TABLET | Freq: Four times a day (QID) | ORAL | 0 refills | Status: DC | PRN

## 2016-01-20 MED ORDER — SODIUM CHLORIDE 0.9 % IV SOLN: INTRAVENOUS | Status: DC

## 2016-01-20 NOTE — ED Notes (Signed)
Pt states understanding of care given and follow up instructions.  Instructed pt of possible fatal reaction if flexeril or hydrocodone mixed with heroin.  Pt taken from ED in wheel chair.  Transported home by spouse.

## 2016-01-20 NOTE — Discharge Instructions (Signed)
Take medications as directed. Take him home and follow-up with your husband's doctors at [facility].

## 2016-01-20 NOTE — ED Triage Notes (Signed)
EMS called out for back pain.  When EMS arrived pt was shaking all over.   Admits to heroin use for 5 years.  Stopped using heroin 5 days ago.  Pt c/o back pain and wants something for pain.

## 2016-01-20 NOTE — ED Provider Notes (Signed)
[facility] DEPT Provider Note   CSN: [id number] Arrival date & time: 01/20/16  1558     History   Chief Complaint Chief Complaint  Patient presents with  . Back Pain  . Emesis    HPI Grace Stokes is a 60 y.o. female.  Patient with complaint of back pain. Patient has history of chronic low back pain. Also has some cervical problems. No longer has a primary care doctor but her husband is followed by [facility]. Patient admitted to treating her back pain recently with heroin and once to stop. But still has the back pain problem. No recent falls or injury. The pain is increased. Associated with vomiting yesterday. Patient feeling somewhat tremulous. Pain is predominantly left-sided low back. No neuro focal deficits. No fevers but does admit to some chills. Patient denies any urinary tract symptoms any cough or congestion.      Past Medical History:  Diagnosis Date  . Arthritis   . Bell's palsy   . Cataract   . Chronic pain   . Renal disorder   . Scoliosis   . Toxemia in pregnancy     Patient Active Problem List   Diagnosis Date Noted  . Cervical myelopathy ([place]) 07/08/2013  . Cervical spondylosis with myelopathy 07/08/2013    Past Surgical History:  Procedure Laterality Date  . ANTERIOR CERVICAL DECOMP/DISCECTOMY FUSION N/A 07/09/2013   Procedure: ANTERIOR CERVICAL DECOMPRESSION/DISCECTOMY FUSION 2 LEVELS;  Surgeon: Consuella Lose, MD;  Location: [facility];  Service: Neurosurgery;  Laterality: N/A;  Anterior Cervical Fusion Cervical five-six, six-seven.  Marland Kitchen BACK SURGERY    . CARPAL TUNNEL RELEASE    . CESAREAN SECTION    . ROTATOR CUFF REPAIR    . SHOULDER SURGERY    . TUBAL LIGATION      OB History    No data available       Home Medications    Prior to Admission medications   Medication Sig Start Date End Date Taking? Authorizing Provider  cyclobenzaprine (FLEXERIL) 10 MG tablet Take 1 tablet (10 mg total) by mouth 2 (two) times daily  as needed for muscle spasms. 01/20/16   Fredia Sorrow, MD  HYDROcodone-acetaminophen (NORCO/VICODIN) 5-325 MG tablet Take 1-2 tablets by mouth every 6 (six) hours as needed. 01/20/16   Fredia Sorrow, MD  naproxen (NAPROSYN) 500 MG tablet Take 1 tablet (500 mg total) by mouth 2 (two) times daily. 01/20/16   Fredia Sorrow, MD    Family History History reviewed. No pertinent family history.  Social History Social History  Substance Use Topics  . Smoking status: Current Every Day Smoker  . Smokeless tobacco: Never Used  . Alcohol use No     Allergies   Review of patient's allergies indicates no known allergies.   Review of Systems Review of Systems  Constitutional: Positive for chills. Negative for fever.  HENT: Negative for congestion.   Eyes: Negative for visual disturbance.  Respiratory: Negative for shortness of breath.   Cardiovascular: Negative for chest pain.  Gastrointestinal: Positive for vomiting. Negative for abdominal pain, diarrhea and nausea.  Genitourinary: Negative for dysuria.  Musculoskeletal: Positive for back pain.  Skin: Negative for rash.  Neurological: Negative for headaches.  Hematological: Does not bruise/bleed easily.  Psychiatric/Behavioral: Negative for confusion.     Physical Exam Updated Vital Signs BP 107/65   Pulse 107   Temp 98.8 F (37.1 C) (Oral)   Resp 18   Ht 5' (1.524 m)   Wt 71.2 kg  LMP 04/06/2011   SpO2 93%   BMI 30.66 kg/m   Physical Exam  Constitutional: She is oriented to person, place, and time. She appears well-developed and well-nourished. No distress.  HENT:  Head: Normocephalic and atraumatic.  Mouth/Throat: Oropharynx is clear and moist.  Eyes: EOM are normal. Pupils are equal, round, and reactive to light.  Neck: Normal range of motion. Neck supple.  Cardiovascular: Regular rhythm and normal heart sounds.   Tachycardic  Pulmonary/Chest: Effort normal and breath sounds normal. No respiratory distress.    Abdominal: Soft. Bowel sounds are normal. There is no tenderness.  Musculoskeletal: Normal range of motion. She exhibits no edema.  Tenderness to palpation to left low back area.  Neurological: She is alert and oriented to person, place, and time. No cranial nerve deficit. She exhibits normal muscle tone. Coordination normal.  Skin: Skin is warm. Capillary refill takes less than 2 seconds. No erythema.  Nursing note and vitals reviewed.    ED Treatments / Results  Labs (all labs ordered are listed, but only abnormal results are displayed) Labs Reviewed  BASIC METABOLIC PANEL - Abnormal; Notable for the following:       Result Value   Potassium 3.2 (*)    Glucose, Bld 250 (*)    BUN 22 (*)    Calcium 8.8 (*)    All other components within normal limits  CBC - Abnormal; Notable for the following:    Platelets 133 (*)    All other components within normal limits   Results for orders placed or performed during the hospital encounter of [id number]  Basic metabolic panel  Result Value Ref Range   Sodium 135 135 - 145 mmol/L   Potassium 3.2 (L) 3.5 - 5.1 mmol/L   Chloride 104 101 - 111 mmol/L   CO2 24 22 - 32 mmol/L   Glucose, Bld 250 (H) 65 - 99 mg/dL   BUN 22 (H) 6 - 20 mg/dL   Creatinine, Ser 0.83 0.44 - 1.00 mg/dL   Calcium 8.8 (L) 8.9 - 10.3 mg/dL   GFR calc non Af Amer >60 >60 mL/min   GFR calc Af Amer >60 >60 mL/min   Anion gap 7 5 - 15  CBC  Result Value Ref Range   WBC 8.6 4.0 - 10.5 K/uL   RBC 4.65 3.87 - 5.11 MIL/uL   Hemoglobin 13.2 12.0 - 15.0 g/dL   HCT 39.5 36.0 - 46.0 %   MCV 84.9 78.0 - 100.0 fL   MCH 28.4 26.0 - 34.0 pg   MCHC 33.4 30.0 - 36.0 g/dL   RDW 14.8 11.5 - 15.5 %   Platelets 133 (L) 150 - 400 K/uL     EKG  EKG Interpretation None       Radiology No results found.  Procedures Procedures (including critical care time)  Medications Ordered in ED Medications  0.9 %  sodium chloride infusion ( Intravenous New Bag/Given 01/20/16 1946)   sodium chloride 0.9 % bolus 500 mL (0 mLs Intravenous Stopped 01/20/16 2015)  ondansetron (ZOFRAN) injection 4 mg (4 mg Intravenous Given 01/20/16 1945)  HYDROmorphone (DILAUDID) injection 1 mg (1 mg Intravenous Given 01/20/16 1945)  sodium chloride 0.9 % bolus 1,000 mL (0 mLs Intravenous Stopped 01/20/16 2300)     Initial Impression / Assessment and Plan / ED Course  I have reviewed the triage vital signs and the nursing notes.  Pertinent labs & imaging results that were available during my care of the patient were reviewed  by me and considered in my medical decision making (see chart for details).  Clinical Course   Patient with an acute exacerbation of chronic back pain. Patient finally admitted that that she been treating her back pain with heroin. Patient's trying to stop the heroin in here with increased back pain no focal neuro deficits. She does have the option to follow up with [facility]. Patient received hydromorphone here with improvement. Did have low bit decreased blood pressure a little tachycardia which improved with IV fluids. Tachycardia still present but blood pressure now normal. Patient nontoxic no acute distress otherwise does not feel like she has any secondary illness. Patient is not febrile. Patient will be treated symptomatically. For the back pain.   Final Clinical Impressions(s) / ED Diagnoses   Final diagnoses:  Acute bilateral low back pain without sciatica  Chronic bilateral low back pain without sciatica    New Prescriptions New Prescriptions   CYCLOBENZAPRINE (FLEXERIL) 10 MG TABLET    Take 1 tablet (10 mg total) by mouth 2 (two) times daily as needed for muscle spasms.   HYDROCODONE-ACETAMINOPHEN (NORCO/VICODIN) 5-325 MG TABLET    Take 1-2 tablets by mouth every 6 (six) hours as needed.   NAPROXEN (NAPROSYN) 500 MG TABLET    Take 1 tablet (500 mg total) by mouth 2 (two) times daily.     Fredia Sorrow, MD 01/20/16 2330

## 2016-01-20 NOTE — ED Notes (Signed)
Per family request, asked physician how much longer it would be before he came in to assess the pt. Physician said he did not know.

## 2016-01-20 NOTE — ED Notes (Signed)
Pt's family at the desk requesting drink and crackers for pt, informed him pt is unable to get anything at this time, until all results are back; pt verbalized understanding

## 2016-01-20 NOTE — ED Triage Notes (Signed)
Pt c/o chills and vomiting since last night.  EMS gave zofran 4 mg IV.

## 2016-01-20 NOTE — ED Notes (Signed)
Asked ER physician how much longer he was going to be before he seen the pt,  ER physician states he is busy with other patients right now and he does not know.

## 2016-01-20 NOTE — ED Notes (Signed)
Pt's family member at the desk asking how much longer before pt gets a room; this RN informed pt the EDP is waiting on more lab values before pt is determined to be admitted

## 2016-01-20 NOTE — ED Notes (Signed)
Per family request, asked physician to do initial assessment on pt. Family stated "we have been waiting 4 hours for him. How much longer will it be?"  Physician responded "I haven't had time to go in there and I don't have time right now."

## 2016-01-28 ENCOUNTER — Encounter: Payer: Self-pay | Admitting: Family Medicine

## 2016-01-28 ENCOUNTER — Ambulatory Visit (INDEPENDENT_AMBULATORY_CARE_PROVIDER_SITE_OTHER): Payer: Medicare Other | Admitting: Family Medicine

## 2016-01-28 VITALS — BP 140/86 | HR 98 | Temp 98.0°F | Ht 60.0 in | Wt 152.0 lb

## 2016-01-28 DIAGNOSIS — F17200 Nicotine dependence, unspecified, uncomplicated: Secondary | ICD-10-CM

## 2016-01-28 DIAGNOSIS — E782 Mixed hyperlipidemia: Secondary | ICD-10-CM | POA: Diagnosis not present

## 2016-01-28 DIAGNOSIS — G959 Disease of spinal cord, unspecified: Secondary | ICD-10-CM

## 2016-01-28 DIAGNOSIS — Z1211 Encounter for screening for malignant neoplasm of colon: Secondary | ICD-10-CM

## 2016-01-28 DIAGNOSIS — Z780 Asymptomatic menopausal state: Secondary | ICD-10-CM | POA: Diagnosis not present

## 2016-01-28 DIAGNOSIS — M41115 Juvenile idiopathic scoliosis, thoracolumbar region: Secondary | ICD-10-CM

## 2016-01-28 DIAGNOSIS — F1120 Opioid dependence, uncomplicated: Secondary | ICD-10-CM | POA: Insufficient documentation

## 2016-01-28 DIAGNOSIS — Z1231 Encounter for screening mammogram for malignant neoplasm of breast: Secondary | ICD-10-CM | POA: Diagnosis not present

## 2016-01-28 DIAGNOSIS — M4120 Other idiopathic scoliosis, site unspecified: Secondary | ICD-10-CM | POA: Insufficient documentation

## 2016-01-28 DIAGNOSIS — Z0000 Encounter for general adult medical examination without abnormal findings: Secondary | ICD-10-CM

## 2016-01-28 DIAGNOSIS — Z1239 Encounter for other screening for malignant neoplasm of breast: Secondary | ICD-10-CM

## 2016-01-28 NOTE — Progress Notes (Signed)
Subjective:  Patient ID: Grace Stokes, female    DOB: 01-14-1956  Age: 60 y.o. MRN: [id number]  CC: New Patient (Initial Visit) (pt here today to establish care )   HPI Grace Stokes presents for Initial visit to establish care. Her main concern is pain. She has a history of use of multiple opiates including methadone in the past. Additionally she has been using heroin lately. Her source of pain is scoliosis and multiple cervical and lumbar herniated discs that have been surgically treated multiple times going back to September 1980. Her first surgery was in September 1980 lower lumbar laminectomy another lower lumbar laminectomy in 1982 a third one in 1985 another 1990. In 1992 she had a 2 level spinal fusion at L4-S1. In 1998 she had an MRI with 2 calcified disks on spinal cord in the thoracic region. In 2000 she had a fluoroscopic S1 nerve root block. Currently she has moderately severe pain in the mid to lower spine and a substernal pain from the thoracic spine spine. She says that she's been told that inoperable and is related to the spine. She had MRIs and further surgical spinal operation to amount half years ago.   She recently went to the emergency department due to pain. That report was reviewed. She was given a limited supply of hydrocodone at that time. She states that she is ashamed of her use of Over the years she has fallen multiple times. She has had a Bell's palsy which rendered her right face weakened. This has left her with permanent damage. She had a colonoscopy done 10 years ago. Dr. Elta Guadeloupe May God with equal gastroenterology performed fat. The outcome is unknown. Patient says it was normal. She's had surgery with Dr. Posey Pronto of orthopedics for left elbow, right shoulder in the year 2000. Patient has not had any health maintenance performed in many years.  History Grace Stokes has a past medical history of Arthritis; Bell's palsy; Cataract; Chronic pain; Renal disorder; Scoliosis; and  Toxemia in pregnancy.   She has a past surgical history that includes Back surgery; Cesarean section; Tubal ligation; Shoulder surgery; Rotator cuff repair; Carpal tunnel release; and Anterior cervical decomp/discectomy fusion (N/A, 07/09/2013).   Her family history is not on file.She reports that she has been smoking.  She has never used smokeless tobacco. She reports that she uses drugs. She reports that she does not drink alcohol.    ROS Review of Systems  Constitutional: Negative for activity change, appetite change and fever.  HENT: Negative for congestion, rhinorrhea and sore throat.   Eyes: Negative for visual disturbance.  Respiratory: Negative for cough and shortness of breath.   Cardiovascular: Negative for chest pain and palpitations.  Gastrointestinal: Negative for abdominal pain, diarrhea and nausea.  Genitourinary: Negative for dysuria.  Musculoskeletal: Positive for arthralgias, back pain, gait problem, joint swelling and myalgias.    Objective:  BP (!) 150/97   Pulse 98   Temp 98 F (36.7 C) (Oral)   Ht 5' (1.524 m)   Wt 152 lb (68.9 kg)   LMP 04/06/2011   BMI 29.69 kg/m   BP Readings from Last 3 Encounters:  01/28/16 (!) 150/97  01/20/16 97/57  07/10/13 124/64    Wt Readings from Last 3 Encounters:  01/28/16 152 lb (68.9 kg)  01/20/16 157 lb (71.2 kg)  07/08/13 157 lb (71.2 kg)     Physical Exam  Constitutional: She is oriented to person, place, and time. She appears well-nourished. No distress.  HENT:  Head: Normocephalic and atraumatic.  Right Ear: External ear normal.  Left Ear: External ear normal.  Nose: Nose normal.  Mouth/Throat: Oropharynx is clear and moist.  Eyes: Conjunctivae and EOM are normal. Pupils are equal, round, and reactive to light. Left eye chemosis: .  Marked right sided lid lag  Neck: Normal range of motion. Neck supple. No thyromegaly present.  Cardiovascular: Normal rate, regular rhythm and normal heart sounds.   No  murmur heard. Pulmonary/Chest: Effort normal and breath sounds normal. No respiratory distress. She has no wheezes. She has no rales.  Abdominal: Soft. Bowel sounds are normal. She exhibits no distension. There is no tenderness.  Musculoskeletal: She exhibits tenderness (for spinal percussion).  Marked dorsal kyphosis  Lymphadenopathy:    She has no cervical adenopathy.  Neurological: She is alert and oriented to person, place, and time. She has normal reflexes. She displays normal reflexes. She exhibits abnormal muscle tone. Coordination abnormal.  Skin: Skin is warm and dry.  Psychiatric: She has a normal mood and affect. Her behavior is normal. Judgment and thought content normal.     Lab Results  Component Value Date   WBC 8.6 01/20/2016   HGB 13.2 01/20/2016   HCT 39.5 01/20/2016   PLT 133 (L) 01/20/2016   GLUCOSE 250 (H) 01/20/2016   ALT 28 07/08/2013   AST 23 07/08/2013   NA 135 01/20/2016   K 3.2 (L) 01/20/2016   CL 104 01/20/2016   CREATININE 0.83 01/20/2016   BUN 22 (H) 01/20/2016   CO2 24 01/20/2016   INR 0.99 07/08/2013    No results found.  Assessment & Plan:   Grace Stokes was seen today for new patient (initial visit).  Diagnoses and all orders for this visit:  Cervical myelopathy ([place]) -     CBC with Differential/Platelet -     CMP14+EGFR -     Lipid panel -     Urinalysis -     TSH -     Ambulatory referral to Pain Clinic  Juvenile idiopathic scoliosis of thoracolumbar region -     CBC with Differential/Platelet -     CMP14+EGFR -     Lipid panel -     Urinalysis -     TSH  Well adult exam -     CBC with Differential/Platelet -     CMP14+EGFR -     Lipid panel -     Urinalysis -     TSH -     DG Chest 2 View; Future  Mixed hyperlipidemia -     CBC with Differential/Platelet -     CMP14+EGFR -     Lipid panel -     Urinalysis -     TSH  Post-menopause -     CBC with Differential/Platelet -     CMP14+EGFR -     Lipid panel -      Urinalysis -     TSH -     DG Bone Density; Future  Screening for colon cancer -     CBC with Differential/Platelet -     CMP14+EGFR -     Lipid panel -     Urinalysis -     TSH -     Ambulatory referral to Gastroenterology  Screening for breast cancer -     CBC with Differential/Platelet -     CMP14+EGFR -     Lipid panel -     Urinalysis -  TSH -     MM Digital Screening; Future  Smoker -     CBC with Differential/Platelet -     CMP14+EGFR -     Lipid panel -     Urinalysis -     TSH -     DG Chest 2 View; Future  Uncomplicated opioid dependence ([place])     I have discontinued Grace Stokes's HYDROcodone-acetaminophen. I am also having her maintain her naproxen and cyclobenzaprine.  No orders of the defined types were placed in this encounter.    Follow-up: Return in about 3 months (around 04/29/2016).  Claretta Fraise, M.D.

## 2016-01-29 LAB — CMP14+EGFR
ALK PHOS: 177 IU/L — AB (ref 39–117)
ALT: 330 IU/L — AB (ref 0–32)
AST: 199 IU/L — AB (ref 0–40)
Albumin/Globulin Ratio: 0.8 — ABNORMAL LOW (ref 1.2–2.2)
Albumin: 3.5 g/dL — ABNORMAL LOW (ref 3.6–4.8)
BUN/Creatinine Ratio: 18 (ref 12–28)
BUN: 11 mg/dL (ref 8–27)
Bilirubin Total: 1 mg/dL (ref 0.0–1.2)
CALCIUM: 9.6 mg/dL (ref 8.7–10.3)
CO2: 28 mmol/L (ref 18–29)
CREATININE: 0.6 mg/dL (ref 0.57–1.00)
Chloride: 95 mmol/L — ABNORMAL LOW (ref 96–106)
GFR calc Af Amer: 115 mL/min/{1.73_m2} (ref >59)
GFR, EST NON AFRICAN AMERICAN: 99 mL/min/{1.73_m2} (ref >59)
GLOBULIN, TOTAL: 4.4 g/dL (ref 1.5–4.5)
GLUCOSE: 259 mg/dL — AB (ref 65–99)
Potassium: 4.8 mmol/L (ref 3.5–5.2)
SODIUM: 136 mmol/L (ref 134–144)
Total Protein: 7.9 g/dL (ref 6.0–8.5)

## 2016-01-29 LAB — CBC WITH DIFFERENTIAL/PLATELET
BASOS ABS: 0 10*3/uL (ref 0.0–0.2)
Basos: 1 %
EOS (ABSOLUTE): 0.1 10*3/uL (ref 0.0–0.4)
EOS: 2 %
HEMATOCRIT: 42.8 % (ref 34.0–46.6)
Hemoglobin: 14.5 g/dL (ref 11.1–15.9)
IMMATURE GRANULOCYTES: 1 %
Immature Grans (Abs): 0 10*3/uL (ref 0.0–0.1)
Lymphocytes Absolute: 1.1 10*3/uL (ref 0.7–3.1)
Lymphs: 29 %
MCH: 28.4 pg (ref 26.6–33.0)
MCHC: 33.9 g/dL (ref 31.5–35.7)
MCV: 84 fL (ref 79–97)
MONOCYTES: 11 %
MONOS ABS: 0.4 10*3/uL (ref 0.1–0.9)
NEUTROS PCT: 56 %
Neutrophils Absolute: 2.1 10*3/uL (ref 1.4–7.0)
Platelets: 183 10*3/uL (ref 150–379)
RBC: 5.1 x10E6/uL (ref 3.77–5.28)
RDW: 15.3 % (ref 12.3–15.4)
WBC: 3.7 10*3/uL (ref 3.4–10.8)

## 2016-01-29 LAB — LIPID PANEL
CHOL/HDL RATIO: 5.7 ratio — AB (ref 0.0–4.4)
CHOLESTEROL TOTAL: 217 mg/dL — AB (ref 100–199)
HDL: 38 mg/dL — AB (ref >39)
LDL CALC: 139 mg/dL — AB (ref 0–99)
TRIGLYCERIDES: 200 mg/dL — AB (ref 0–149)
VLDL CHOLESTEROL CAL: 40 mg/dL (ref 5–40)

## 2016-01-29 LAB — TSH: TSH: 0.442 u[IU]/mL — ABNORMAL LOW (ref 0.450–4.500)

## 2016-02-05 LAB — HEPATITIS PANEL, ACUTE
HEP B C IGM: POSITIVE — AB
HEP C VIRUS AB: 0.1 {s_co_ratio} (ref 0.0–0.9)
Hep A IgM: NEGATIVE
Hepatitis B Surface Ag: POSITIVE — AB

## 2016-02-05 LAB — SPECIMEN STATUS REPORT

## 2016-02-12 ENCOUNTER — Telehealth: Payer: Self-pay | Admitting: Family Medicine

## 2016-02-12 DIAGNOSIS — B169 Acute hepatitis B without delta-agent and without hepatic coma: Secondary | ICD-10-CM

## 2016-02-12 NOTE — Telephone Encounter (Signed)
Tried to contact patient and no answer.  

## 2016-02-17 ENCOUNTER — Telehealth: Payer: Self-pay | Admitting: Family Medicine

## 2016-02-17 NOTE — Telephone Encounter (Signed)
Handled in another encounter

## 2016-02-17 NOTE — Telephone Encounter (Signed)
Spoke with patient regarding recent lab results. Referral placed to GI and patient is aware.

## 2016-03-18 DIAGNOSIS — K648 Other hemorrhoids: Secondary | ICD-10-CM | POA: Diagnosis not present

## 2016-03-18 DIAGNOSIS — Z1211 Encounter for screening for malignant neoplasm of colon: Secondary | ICD-10-CM | POA: Diagnosis not present

## 2016-03-18 DIAGNOSIS — K644 Residual hemorrhoidal skin tags: Secondary | ICD-10-CM | POA: Diagnosis not present

## 2016-03-18 DIAGNOSIS — D124 Benign neoplasm of descending colon: Secondary | ICD-10-CM | POA: Diagnosis not present

## 2016-03-24 DIAGNOSIS — B169 Acute hepatitis B without delta-agent and without hepatic coma: Secondary | ICD-10-CM | POA: Diagnosis not present

## 2016-03-25 DIAGNOSIS — B169 Acute hepatitis B without delta-agent and without hepatic coma: Secondary | ICD-10-CM | POA: Diagnosis not present

## 2016-04-15 DIAGNOSIS — B169 Acute hepatitis B without delta-agent and without hepatic coma: Secondary | ICD-10-CM | POA: Diagnosis not present

## 2016-07-01 ENCOUNTER — Ambulatory Visit (INDEPENDENT_AMBULATORY_CARE_PROVIDER_SITE_OTHER): Payer: Medicare Other | Admitting: Physician Assistant

## 2016-07-01 ENCOUNTER — Encounter: Payer: Self-pay | Admitting: Physician Assistant

## 2016-07-01 VITALS — BP 155/84 | HR 112 | Temp 97.7°F | Ht 61.0 in | Wt 158.0 lb

## 2016-07-01 DIAGNOSIS — H60392 Other infective otitis externa, left ear: Secondary | ICD-10-CM | POA: Diagnosis not present

## 2016-07-01 DIAGNOSIS — J40 Bronchitis, not specified as acute or chronic: Secondary | ICD-10-CM | POA: Diagnosis not present

## 2016-07-01 MED ORDER — NEOMYCIN-POLYMYXIN-HC 3.5-10000-1 OT SOLN: 3.0000 [drp] | Freq: Four times a day (QID) | OTIC | 0 refills | Status: DC

## 2016-07-01 MED ORDER — METHYLPREDNISOLONE ACETATE 80 MG/ML IJ SUSP
80.0000 mg | Freq: Once | INTRAMUSCULAR | Status: AC
  Administered 2016-07-01: 80 mg via INTRAMUSCULAR

## 2016-07-01 MED ORDER — DOXYCYCLINE HYCLATE 100 MG PO TABS: 100.0000 mg | ORAL_TABLET | Freq: Two times a day (BID) | ORAL | 0 refills | Status: DC

## 2016-07-01 MED ORDER — HYDROCODONE-HOMATROPINE 5-1.5 MG/5ML PO SYRP: 5.0000 mL | ORAL_SOLUTION | Freq: Four times a day (QID) | ORAL | 0 refills | Status: DC | PRN

## 2016-07-01 NOTE — Progress Notes (Signed)
BP (!) 155/84   Pulse (!) 112   Temp 97.7 F (36.5 C) (Oral)   Ht 5\' 1"  (1.549 m)   Wt 158 lb (71.7 kg)   LMP 04/06/2011   SpO2 98%   BMI 29.85 kg/m    Subjective:    Patient ID: Grace Stokes, female    DOB: [birth year], 61 y.o.   MRN: [id number]  HPI: Grace Stokes is a 61 y.o. female presenting on 07/01/2016 for Ear Pain (left. x 1 day); chest congestion (x 2 week); and Cough  Patient with several days of progressing upper respiratory and bronchial symptoms. Initially there was more upper respiratory congestion. This progressed to having significant cough that is productive throughout the day and severe at night. There is occasional wheezing after coughing. She will sometimes have slight dyspnea on exertion. It is productive mucus that is yellow in color. Denies any blood.  Relevant past medical, surgical, family and social history reviewed and updated as indicated. Allergies and medications reviewed and updated.  Past Medical History:  Diagnosis Date  . Arthritis   . Bell's palsy   . Cataract   . Chronic pain   . Renal disorder   . Scoliosis   . Toxemia in pregnancy     Past Surgical History:  Procedure Laterality Date  . ANTERIOR CERVICAL DECOMP/DISCECTOMY FUSION N/A 07/09/2013   Procedure: ANTERIOR CERVICAL DECOMPRESSION/DISCECTOMY FUSION 2 LEVELS;  Surgeon: Consuella Lose, MD;  Location: [facility];  Service: Neurosurgery;  Laterality: N/A;  Anterior Cervical Fusion Cervical five-six, six-seven.  Marland Kitchen BACK SURGERY    . CARPAL TUNNEL RELEASE    . CESAREAN SECTION    . ROTATOR CUFF REPAIR    . SHOULDER SURGERY    . TUBAL LIGATION      Review of Systems  Allergies as of 07/01/2016   No Known Allergies     Medication List       Accurate as of 07/01/16  6:41 PM. Always use your most recent med list.          cyclobenzaprine 10 MG tablet Commonly known as:  FLEXERIL Take 1 tablet (10 mg total) by mouth 2 (two) times daily as needed for muscle spasms.   doxycycline 100 MG tablet Commonly known as:  VIBRA-TABS Take 1 tablet (100 mg total) by mouth 2 (two) times daily. 1 po bid   HYDROcodone-homatropine 5-1.5 MG/5ML syrup Commonly known as:  HYCODAN Take 5-10 mLs by mouth every 6 (six) hours as needed.   neomycin-polymyxin-hydrocortisone otic solution Commonly known as:  CORTISPORIN Place 3 drops into the left ear 4 (four) times daily.          Objective:    BP (!) 155/84   Pulse (!) 112   Temp 97.7 F (36.5 C) (Oral)   Ht 5\' 1"  (1.549 m)   Wt 158 lb (71.7 kg)   LMP 04/06/2011   SpO2 98%   BMI 29.85 kg/m   No Known Allergies  Physical Exam  Constitutional: She is oriented to person, place, and time. She appears well-developed and well-nourished.  HENT:  Head: Normocephalic and atraumatic.  Right Ear: No tenderness.  Left Ear: There is drainage, swelling and tenderness.  Nose: Mucosal edema and rhinorrhea present. Right sinus exhibits maxillary sinus tenderness and frontal sinus tenderness. Left sinus exhibits maxillary sinus tenderness and frontal sinus tenderness.  Mouth/Throat: Oropharyngeal exudate and posterior oropharyngeal erythema present.  Eyes: Conjunctivae and EOM are normal. Pupils are equal, round, and reactive to  light.  Neck: Normal range of motion. Neck supple.  Cardiovascular: Normal rate, regular rhythm, normal heart sounds and intact distal pulses.   Pulmonary/Chest: Effort normal. She has wheezes in the right upper field and the left upper field.  Abdominal: Soft. Bowel sounds are normal.  Neurological: She is alert and oriented to person, place, and time. She has normal reflexes.  Skin: Skin is warm and dry. No rash noted.  Psychiatric: She has a normal mood and affect. Her behavior is normal. Judgment and thought content normal.        Assessment & Plan:   1. Bronchitis - doxycycline (VIBRA-TABS) 100 MG tablet; Take 1 tablet (100 mg total) by mouth 2 (two) times daily. 1 po bid  Dispense: 20  tablet; Refill: 0 - methylPREDNISolone acetate (DEPO-MEDROL) injection 80 mg; Inject 1 mL (80 mg total) into the muscle once. - HYDROcodone-homatropine (HYCODAN) 5-1.5 MG/5ML syrup; Take 5-10 mLs by mouth every 6 (six) hours as needed.  Dispense: 240 mL; Refill: 0  2. Other infective acute otitis externa of left ear - neomycin-polymyxin-hydrocortisone (CORTISPORIN) otic solution; Place 3 drops into the left ear 4 (four) times daily.  Dispense: 10 mL; Refill: 0   Continue all other maintenance medications as listed above.  Follow up plan: Return if symptoms worsen or fail to improve.  Educational handout given for bronchitis  Terald Sleeper PA-C [facility] [address]  [place], [place] [address] [phone]   07/01/2016, 6:41 PM

## 2016-07-01 NOTE — Patient Instructions (Signed)

## 2016-07-06 ENCOUNTER — Other Ambulatory Visit: Payer: Self-pay | Admitting: *Deleted

## 2016-07-06 NOTE — Telephone Encounter (Signed)
Pharmacy aware

## 2016-07-06 NOTE — Telephone Encounter (Signed)
I saw her as urgent care, she is patient of Dr. Livia Snellen. No maintenance meds were discussed. So I did not do anything with flexeril

## 2016-08-26 ENCOUNTER — Ambulatory Visit (INDEPENDENT_AMBULATORY_CARE_PROVIDER_SITE_OTHER): Payer: Medicare Other

## 2016-08-26 ENCOUNTER — Encounter: Payer: Self-pay | Admitting: Family Medicine

## 2016-08-26 ENCOUNTER — Ambulatory Visit (INDEPENDENT_AMBULATORY_CARE_PROVIDER_SITE_OTHER): Payer: Medicare Other | Admitting: Family Medicine

## 2016-08-26 VITALS — BP 157/85 | HR 104 | Temp 98.0°F | Ht 61.0 in | Wt 154.0 lb

## 2016-08-26 DIAGNOSIS — R05 Cough: Secondary | ICD-10-CM

## 2016-08-26 DIAGNOSIS — F17200 Nicotine dependence, unspecified, uncomplicated: Secondary | ICD-10-CM

## 2016-08-26 DIAGNOSIS — R059 Cough, unspecified: Secondary | ICD-10-CM | POA: Insufficient documentation

## 2016-08-26 DIAGNOSIS — I10 Essential (primary) hypertension: Secondary | ICD-10-CM | POA: Diagnosis not present

## 2016-08-26 MED ORDER — AMLODIPINE BESYLATE 5 MG PO TABS: 5.0000 mg | ORAL_TABLET | Freq: Every day | ORAL | 5 refills | Status: DC

## 2016-08-26 NOTE — Progress Notes (Signed)
Your chest x-ray looked normal. Thanks, WS.

## 2016-08-26 NOTE — Progress Notes (Signed)
Subjective:  Patient ID: Grace Stokes, female    DOB: [birth year]  Age: 61 y.o. MRN: [id number]  CC: Hypertension (pt here today after being seen by Dr Irving Shows 07/27/16 and having a BP reading of 184/108 and was told to see PCP.)   HPI [name] presents for  New onset of hypertension. First noted recently with evaluation by podiatry, Dr. Irving Shows. This was about a month ago. She is in today for follow-up of this. No readings in the meantime. She denies any symptoms such as chest pain shortness of breath or focal lateralizing neurologic signs. No headaches. She is a smoker and has a chronic cough.   History Grace Stokes has a past medical history of Arthritis; Bell's palsy; Cataract; Chronic pain; Renal disorder; Scoliosis; and Toxemia in pregnancy.   She has a past surgical history that includes Back surgery; Cesarean section; Tubal ligation; Shoulder surgery; Rotator cuff repair; Carpal tunnel release; and Anterior cervical decomp/discectomy fusion (N/A, 07/09/2013).   Her family history is not on file.She reports that she has been smoking.  She has never used smokeless tobacco. She reports that she uses drugs. She reports that she does not drink alcohol.    ROS Review of Systems  Constitutional: Negative for activity change, appetite change and fever.  HENT: Negative for congestion, rhinorrhea and sore throat.   Eyes: Negative for visual disturbance.  Respiratory: Negative for cough and shortness of breath.   Cardiovascular: Negative for chest pain and palpitations.  Gastrointestinal: Negative for abdominal pain, diarrhea and nausea.  Genitourinary: Negative for dysuria.  Musculoskeletal: Positive for arthralgias, back pain and myalgias.    Objective:  BP (!) 157/85   Pulse (!) 104   Temp 98 F (36.7 C) (Oral)   Ht _0  (1.549 m)   Wt 154 lb (69.9 kg)   LMP 04/06/2011   BMI 29.10 kg/m   BP Readings from Last 3 Encounters:  08/26/16 (!) 157/85  07/01/16 (!) 155/84  01/28/16  140/86    Wt Readings from Last 3 Encounters:  08/26/16 154 lb (69.9 kg)  07/01/16 158 lb (71.7 kg)  01/28/16 152 lb (68.9 kg)     Physical Exam  Constitutional: She is oriented to person, place, and time. She appears well-developed and well-nourished. No distress.  HENT:  Head: Normocephalic.  Neck: Normal range of motion.  Cardiovascular: Normal rate and regular rhythm.   Pulmonary/Chest: Breath sounds normal.  Abdominal: Soft.  Musculoskeletal: Normal range of motion.  Neurological: She is alert and oriented to person, place, and time.  Skin: Skin is warm and dry.  Psychiatric: She has a normal mood and affect.      Assessment & Plan:   Zada was seen today for hypertension.  Diagnoses and all orders for this visit:  Essential hypertension -     CBC with Differential/Platelet -     CMP14+EGFR  Cough -     CBC with Differential/Platelet -     CMP14+EGFR -     DG Chest 2 View; Future  Smoker -     DG Chest 2 View; Future  Other orders -     amLODipine (NORVASC) 5 MG tablet; Take 1 tablet (5 mg total) by mouth daily. For blood pressure       I have discontinued Ms. Doke's doxycycline, HYDROcodone-homatropine, and neomycin-polymyxin-hydrocortisone. I am also having her start on amLODipine. Additionally, I am having her maintain her cyclobenzaprine.  Allergies as of 08/26/2016   No Known Allergies  Medication List       Accurate as of 08/26/16 11:59 PM. Always use your most recent med list.          amLODipine 5 MG tablet Commonly known as:  NORVASC Take 1 tablet (5 mg total) by mouth daily. For blood pressure   cyclobenzaprine 10 MG tablet Commonly known as:  FLEXERIL Take 1 tablet (10 mg total) by mouth 2 (two) times daily as needed for muscle spasms.        Follow-up: Return in about 1 month (around 09/26/2016) for hypertension.  Claretta Fraise, M.D.

## 2016-08-27 LAB — CBC WITH DIFFERENTIAL/PLATELET
BASOS: 1 %
Basophils Absolute: 0 10*3/uL (ref 0.0–0.2)
EOS (ABSOLUTE): 0 10*3/uL (ref 0.0–0.4)
EOS: 1 %
HEMATOCRIT: 36.7 % (ref 34.0–46.6)
HEMOGLOBIN: 12.1 g/dL (ref 11.1–15.9)
IMMATURE GRANULOCYTES: 1 %
Immature Grans (Abs): 0 10*3/uL (ref 0.0–0.1)
LYMPHS ABS: 0.8 10*3/uL (ref 0.7–3.1)
Lymphs: 34 %
MCH: 29.8 pg (ref 26.6–33.0)
MCHC: 33 g/dL (ref 31.5–35.7)
MCV: 90 fL (ref 79–97)
MONOCYTES: 13 %
MONOS ABS: 0.3 10*3/uL (ref 0.1–0.9)
NEUTROS PCT: 50 %
Neutrophils Absolute: 1.1 10*3/uL — ABNORMAL LOW (ref 1.4–7.0)
Platelets: 77 10*3/uL — CL (ref 150–379)
RBC: 4.06 x10E6/uL (ref 3.77–5.28)
RDW: 14.9 % (ref 12.3–15.4)
WBC: 2.2 10*3/uL — AB (ref 3.4–10.8)

## 2016-08-27 LAB — CMP14+EGFR
ALBUMIN: 3 g/dL — AB (ref 3.6–4.8)
ALT: 66 IU/L — ABNORMAL HIGH (ref 0–32)
AST: 110 IU/L — ABNORMAL HIGH (ref 0–40)
Albumin/Globulin Ratio: 0.5 — ABNORMAL LOW (ref 1.2–2.2)
Alkaline Phosphatase: 190 IU/L — ABNORMAL HIGH (ref 39–117)
BUN / CREAT RATIO: 13 (ref 12–28)
BUN: 7 mg/dL — AB (ref 8–27)
Bilirubin Total: 1.1 mg/dL (ref 0.0–1.2)
CO2: 23 mmol/L (ref 18–29)
Calcium: 8.4 mg/dL — ABNORMAL LOW (ref 8.7–10.3)
Chloride: 95 mmol/L — ABNORMAL LOW (ref 96–106)
Creatinine, Ser: 0.55 mg/dL — ABNORMAL LOW (ref 0.57–1.00)
GFR, EST AFRICAN AMERICAN: 118 mL/min/{1.73_m2} (ref >59)
GFR, EST NON AFRICAN AMERICAN: 102 mL/min/{1.73_m2} (ref >59)
GLOBULIN, TOTAL: 6.2 g/dL — AB (ref 1.5–4.5)
Glucose: 319 mg/dL — ABNORMAL HIGH (ref 65–99)
Potassium: 3.8 mmol/L (ref 3.5–5.2)
SODIUM: 133 mmol/L — AB (ref 134–144)
TOTAL PROTEIN: 9.2 g/dL — AB (ref 6.0–8.5)

## 2016-09-02 ENCOUNTER — Telehealth: Payer: Self-pay | Admitting: Family Medicine

## 2016-09-28 ENCOUNTER — Encounter: Payer: Self-pay | Admitting: Family Medicine

## 2016-09-28 ENCOUNTER — Ambulatory Visit (INDEPENDENT_AMBULATORY_CARE_PROVIDER_SITE_OTHER): Payer: Medicare Other | Admitting: Family Medicine

## 2016-09-28 VITALS — BP 146/79 | HR 110 | Temp 99.5°F | Ht 61.0 in | Wt 149.0 lb

## 2016-09-28 DIAGNOSIS — I10 Essential (primary) hypertension: Secondary | ICD-10-CM | POA: Diagnosis not present

## 2016-09-28 MED ORDER — METOPROLOL SUCCINATE ER 50 MG PO TB24: 50.0000 mg | ORAL_TABLET | Freq: Every day | ORAL | 2 refills | Status: DC

## 2016-09-28 NOTE — Progress Notes (Signed)
Subjective:  Patient ID: Grace Stokes, female    DOB: [birth year]  Age: 61 y.o. MRN: [id number]  CC: Hypertension (pt here today for routine follow up on her HTN, no other concerns voiced. She did stop smoking as of Saturday 09/26/16.)   HPI [name] presents for  follow-up of hypertension. Patient has no history of headache chest pain or shortness of breath or recent cough. Patient also denies symptoms of TIA such as numbness weakness lateralizing.  Patient denies side effects from his medication. States taking it regularly. Expresses concern about weight gain associated with stopping smoking. But has not gained any so far.    History Devoiry has a past medical history of Arthritis; Bell's palsy; Cataract; Chronic pain; Renal disorder; Scoliosis; and Toxemia in pregnancy.   She has a past surgical history that includes Back surgery; Cesarean section; Tubal ligation; Shoulder surgery; Rotator cuff repair; Carpal tunnel release; and Anterior cervical decomp/discectomy fusion (N/A, 07/09/2013).   Her family history is not on file.She reports that she quit smoking 2 days ago. Her smoking use included Cigarettes. She has never used smokeless tobacco. She reports that she uses drugs. She reports that she does not drink alcohol.    ROS Review of Systems  Constitutional: Negative for activity change, appetite change and fever.  HENT: Negative for congestion, rhinorrhea and sore throat.   Eyes: Negative for visual disturbance.  Respiratory: Negative for cough and shortness of breath.   Cardiovascular: Negative for chest pain and palpitations.  Gastrointestinal: Negative for abdominal pain, diarrhea and nausea.  Genitourinary: Negative for dysuria.  Musculoskeletal: Negative for arthralgias and myalgias.    Objective:  BP (!) 146/79   Pulse (!) 110   Temp 99.5 F (37.5 C) (Oral)   Ht 5\' 1"  (1.549 m)   Wt 149 lb (67.6 kg)   LMP 04/06/2011   BMI 28.15 kg/m   BP Readings from  Last 3 Encounters:  09/28/16 (!) 146/79  08/26/16 (!) 157/85  07/01/16 (!) 155/84    Wt Readings from Last 3 Encounters:  09/28/16 149 lb (67.6 kg)  08/26/16 154 lb (69.9 kg)  07/01/16 158 lb (71.7 kg)     Physical Exam  Constitutional: She is oriented to person, place, and time. She appears well-developed and well-nourished. No distress.  HENT:  Head: Normocephalic and atraumatic.  Eyes: Conjunctivae are normal. Pupils are equal, round, and reactive to light.  Neck: Normal range of motion. Neck supple. No thyromegaly present.  Cardiovascular: Normal rate, regular rhythm and normal heart sounds.   No murmur heard. Pulmonary/Chest: Effort normal and breath sounds normal. No respiratory distress. She has no wheezes. She has no rales.  Abdominal: Soft. Bowel sounds are normal. She exhibits no distension. There is no tenderness.  Musculoskeletal: Normal range of motion.  Stooped body habitus   Lymphadenopathy:    She has no cervical adenopathy.  Neurological: She is alert and oriented to person, place, and time.  Skin: Skin is warm and dry.  Psychiatric: She has a normal mood and affect. Her behavior is normal. Judgment and thought content normal.      Assessment & Plan:   Vernice was seen today for hypertension.  Diagnoses and all orders for this visit:  Essential hypertension  Other orders -     metoprolol succinate (TOPROL-XL) 50 MG 24 hr tablet; Take 1 tablet (50 mg total) by mouth daily. For heart and blood pressure    I am having Ms. Domingo Cocking start on metoprolol  succinate. I am also having her maintain her cyclobenzaprine and amLODipine.  Allergies as of 09/28/2016   No Known Allergies     Medication List       Accurate as of 09/28/16  1:37 PM. Always use your most recent med list.          amLODipine 5 MG tablet Commonly known as:  NORVASC Take 1 tablet (5 mg total) by mouth daily. For blood pressure   cyclobenzaprine 10 MG tablet Commonly known as:   FLEXERIL Take 1 tablet (10 mg total) by mouth 2 (two) times daily as needed for muscle spasms.   metoprolol succinate 50 MG 24 hr tablet Commonly known as:  TOPROL-XL Take 1 tablet (50 mg total) by mouth daily. For heart and blood pressure        Follow-up: Return in about 1 month (around 10/28/2016).  Claretta Fraise, M.D.

## 2016-10-22 ENCOUNTER — Encounter: Payer: Self-pay | Admitting: Family Medicine

## 2016-11-11 ENCOUNTER — Other Ambulatory Visit: Payer: Self-pay | Admitting: *Deleted

## 2016-11-12 NOTE — Telephone Encounter (Signed)
Aware she will need to be seen 

## 2016-11-12 NOTE — Telephone Encounter (Signed)
Needs to be seen, prescribed in ED

## 2016-12-01 ENCOUNTER — Encounter: Payer: Self-pay | Admitting: Family Medicine

## 2016-12-01 ENCOUNTER — Ambulatory Visit (INDEPENDENT_AMBULATORY_CARE_PROVIDER_SITE_OTHER): Payer: Medicare Other | Admitting: Family Medicine

## 2016-12-01 VITALS — BP 137/85 | HR 76 | Temp 97.8°F | Ht 61.0 in | Wt 146.0 lb

## 2016-12-01 DIAGNOSIS — F1110 Opioid abuse, uncomplicated: Secondary | ICD-10-CM | POA: Insufficient documentation

## 2016-12-01 DIAGNOSIS — I10 Essential (primary) hypertension: Secondary | ICD-10-CM | POA: Diagnosis not present

## 2016-12-01 DIAGNOSIS — M4125 Other idiopathic scoliosis, thoracolumbar region: Secondary | ICD-10-CM

## 2016-12-01 DIAGNOSIS — M961 Postlaminectomy syndrome, not elsewhere classified: Secondary | ICD-10-CM

## 2016-12-01 DIAGNOSIS — F1120 Opioid dependence, uncomplicated: Secondary | ICD-10-CM

## 2016-12-01 MED ORDER — METOPROLOL SUCCINATE ER 50 MG PO TB24: 50.0000 mg | ORAL_TABLET | Freq: Every day | ORAL | 5 refills | Status: DC

## 2016-12-01 MED ORDER — AMLODIPINE BESYLATE 5 MG PO TABS: 5.0000 mg | ORAL_TABLET | Freq: Every day | ORAL | 5 refills | Status: DC

## 2016-12-01 MED ORDER — CYCLOBENZAPRINE HCL 10 MG PO TABS: 10.0000 mg | ORAL_TABLET | Freq: Two times a day (BID) | ORAL | 5 refills | Status: DC | PRN

## 2016-12-01 NOTE — Addendum Note (Signed)
Addended by: Marylin Crosby on: 12/01/2016 01:43 PM   Modules accepted: Orders

## 2016-12-01 NOTE — Progress Notes (Signed)
Subjective:  Patient ID: Grace Stokes, female    DOB: November 30, 1955  Age: 61 y.o. MRN: [id number]  CC: Hypertension (pt here today for routine follow up of her HTN and also having c/o "roaring" in left ear)   HPI Grace Stokes presents for  follow-up of hypertension. Patient has no history of headache chest pain or shortness of breath or recent cough. Patient also denies symptoms of TIA such as numbness weakness lateralizing. Patient checks  blood pressure at home and has not had any elevated readings recently. Patient denies side effects from his medication. States taking it regularly.  Patient states that the blood pressure is elevated when she is in pain. She reports that she has had 4 lumbar laminectomies and one fusion. She has also had multiple cervical fusions. Patient says that she got on hair when after medically being put on opiates for pain relief. She doesn't know anybody to get her one from in [place] so she hasn't had any about a month. Most recently she went to [facility] one month ago and had heroin. Her pain is so severe that she's been in bed for the last week.  Patient also has ringing in her ears. She says that her left eardrum ruptured when she first came here approximately 6-8 months ago. It's been ringing ever since. It does seem to increased over the last couple of weeks.   Depression screen [facility] 2/9 12/01/2016 09/28/2016 08/26/2016  Decreased Interest 0 0 0  Down, Depressed, Hopeless 0 0 0  PHQ - 2 Score 0 0 0  Altered sleeping - - -  Tired, decreased energy - - -  Change in appetite - - -  Feeling bad or failure about yourself  - - -  Trouble concentrating - - -  Moving slowly or fidgety/restless - - -  PHQ-9 Score - - -    History Grace Stokes has a past medical history of Arthritis; Bell's palsy; Cataract; Chronic pain; Renal disorder; Scoliosis; and Toxemia in pregnancy.   She has a past surgical history that includes Back surgery; Cesarean section; Tubal ligation;  Shoulder surgery; Rotator cuff repair; Carpal tunnel release; and Anterior cervical decomp/discectomy fusion (N/A, 07/09/2013).   Her family history is not on file.She reports that she quit smoking about 2 months ago. Her smoking use included Cigarettes. She has never used smokeless tobacco. She reports that she uses drugs. She reports that she does not drink alcohol.    ROS Review of Systems  Constitutional: Negative for activity change, appetite change and fever.  HENT: Negative for congestion, rhinorrhea and sore throat.   Eyes: Negative for visual disturbance.  Respiratory: Negative for cough and shortness of breath.   Cardiovascular: Negative for chest pain and palpitations.  Gastrointestinal: Negative for abdominal pain, diarrhea and nausea.  Genitourinary: Negative for dysuria.  Musculoskeletal: Positive for back pain. Negative for arthralgias and myalgias.    Objective:  BP 137/85   Pulse 76   Temp 97.8 F (36.6 C) (Oral)   Ht 5\' 1"  (1.549 m)   Wt 146 lb (66.2 kg)   LMP 04/06/2011   BMI 27.59 kg/m   BP Readings from Last 3 Encounters:  12/01/16 137/85  09/28/16 (!) 146/79  08/26/16 (!) 157/85    Wt Readings from Last 3 Encounters:  12/01/16 146 lb (66.2 kg)  09/28/16 149 lb (67.6 kg)  08/26/16 154 lb (69.9 kg)     Physical Exam  Constitutional: She is oriented to person, place, and time.  She appears well-developed and well-nourished. No distress.  HENT:  Head: Normocephalic and atraumatic.  Right Ear: External ear normal.  Left Ear: External ear normal.  Nose: Nose normal.  Mouth/Throat: Oropharynx is clear and moist.  Eyes: Pupils are equal, round, and reactive to light. Conjunctivae and EOM are normal.  Neck: Normal range of motion. Neck supple. No thyromegaly present.  Cardiovascular: Normal rate, regular rhythm and normal heart sounds.   No murmur heard. Pulmonary/Chest: Effort normal and breath sounds normal. No respiratory distress. She has no  wheezes. She has no rales.  Abdominal: Soft. Bowel sounds are normal. She exhibits no distension. There is no tenderness.  Lymphadenopathy:    She has no cervical adenopathy.  Neurological: She is alert and oriented to person, place, and time. She has normal reflexes.  Skin: Skin is warm and dry.  Psychiatric: She has a normal mood and affect. Her behavior is normal. Judgment and thought content normal.      Assessment & Plan:   Grace Stokes was seen today for hypertension.  Diagnoses and all orders for this visit:  Essential hypertension  Heroin abuse -     Ambulatory referral to Chemical Dependency  Other idiopathic scoliosis, thoracolumbar region -     Ambulatory referral to Chemical Dependency  Uncomplicated opioid dependence (Grace Stokes)  Multilevel failed back syndrome -     Ambulatory referral to Chemical Dependency       I am having Grace Stokes maintain her cyclobenzaprine, amLODipine, and metoprolol succinate.  Allergies as of 12/01/2016   No Known Allergies     Medication List       Accurate as of 12/01/16  1:35 PM. Always use your most recent med list.          amLODipine 5 MG tablet Commonly known as:  NORVASC Take 1 tablet (5 mg total) by mouth daily. For blood pressure   cyclobenzaprine 10 MG tablet Commonly known as:  FLEXERIL Take 1 tablet (10 mg total) by mouth 2 (two) times daily as needed for muscle spasms.   metoprolol succinate 50 MG 24 hr tablet Commonly known as:  TOPROL-XL Take 1 tablet (50 mg total) by mouth daily. For heart and blood pressure        Follow-up: Return in about 6 months (around 06/03/2017).  Claretta Fraise, M.D.

## 2016-12-02 ENCOUNTER — Ambulatory Visit
Admission: RE | Admit: 2016-12-02 | Discharge: 2016-12-02 | Disposition: A | Discharge status: Home / Self Care | Payer: Medicare Other | Source: Ambulatory Visit | Attending: Family Medicine | Admitting: Family Medicine

## 2016-12-02 DIAGNOSIS — Z1239 Encounter for other screening for malignant neoplasm of breast: Secondary | ICD-10-CM

## 2016-12-02 DIAGNOSIS — Z1231 Encounter for screening mammogram for malignant neoplasm of breast: Secondary | ICD-10-CM | POA: Diagnosis not present

## 2016-12-15 ENCOUNTER — Ambulatory Visit (INDEPENDENT_AMBULATORY_CARE_PROVIDER_SITE_OTHER): Payer: Medicare Other | Admitting: Family Medicine

## 2016-12-15 VITALS — BP 165/80 | HR 90 | Temp 99.2°F | Ht 61.0 in | Wt 146.0 lb

## 2016-12-15 DIAGNOSIS — L03119 Cellulitis of unspecified part of limb: Secondary | ICD-10-CM

## 2016-12-15 DIAGNOSIS — R011 Cardiac murmur, unspecified: Secondary | ICD-10-CM | POA: Insufficient documentation

## 2016-12-15 DIAGNOSIS — L02519 Cutaneous abscess of unspecified hand: Secondary | ICD-10-CM

## 2016-12-15 DIAGNOSIS — F1110 Opioid abuse, uncomplicated: Secondary | ICD-10-CM | POA: Diagnosis not present

## 2016-12-15 NOTE — Patient Instructions (Signed)
As we discussed that have a large concern that you have a more deep-seated infection in your hand. I recommend that you please go immediately to the emergency department for emergent evaluation and likely IV antibiotics. Her hand may need drainage by hand surgery. You are at increased risk for having this infection in your blood.   Cellulitis, Adult Cellulitis is a skin infection. The infected area is usually red and sore. This condition occurs most often in the arms and lower legs. It is very important to get treated for this condition. Follow these instructions at home:  Take over-the-counter and prescription medicines only as told by your doctor.  If you were prescribed an antibiotic medicine, take it as told by your doctor. Do not stop taking the antibiotic even if you start to feel better.  Drink enough fluid to keep your pee (urine) clear or pale yellow.  Do not touch or rub the infected area.  Raise (elevate) the infected area above the level of your heart while you are sitting or lying down.  Place warm or cold wet cloths (warm or cold compresses) on the infected area. Do this as told by your doctor.  Keep all follow-up visits as told by your doctor. This is important. These visits let your doctor make sure your infection is not getting worse. Contact a doctor if:  You have a fever.  Your symptoms do not get better after 1-2 days of treatment.  Your bone or joint under the infected area starts to hurt after the skin has healed.  Your infection comes back. This can happen in the same area or another area.  You have a swollen bump in the infected area.  You have new symptoms.  You feel ill and also have muscle aches and pains. Get help right away if:  Your symptoms get worse.  You feel very sleepy.  You throw up (vomit) or have watery poop (diarrhea) for a long time.  There are red streaks coming from the infected area.  Your red area gets larger.  Your red area  turns darker. This information is not intended to replace advice given to you by your health care provider. Make sure you discuss any questions you have with your health care provider. Document Released: 09/16/2007 Document Revised: 09/05/2015 Document Reviewed: 02/06/2015 Elsevier Interactive Patient Education  2018 [employer].

## 2016-12-15 NOTE — Progress Notes (Signed)
Subjective: DQ:Grace Stokes left hand PCP: Claretta Fraise, MD XQJ:Grace Stokes is a 61 y.o. female presenting to clinic today for:  1. Swollen hand Patient reports a one-day history of swollen, red, painful left hand. She reports that this started yesterday after attempting to lift up her car hood. She denies crush injury, scratch, laceration, wound, injury to the hand. She notes that swelling and pain occurred sometime after she finished working on her car. She reports pain, particularly with movement of the hand. She reports increased warmth of the left hand. She acknowledges that she is a heroin user. Though she reports she often injects into the right hand and not the left. Last use was about a week ago. She denies fever, nausea, vomiting. She does report she feels poorly however.  No Known Allergies Past Medical History:  Diagnosis Date  . Arthritis   . Bell's palsy   . Cataract   . Chronic pain   . Renal disorder   . Scoliosis   . Toxemia in pregnancy    No family history on file. Social Hx: uses heroin.Current medications reviewed.   ROS: Per HPI  Objective: Office vital signs reviewed. BP (!) 165/80 (BP Location: Right Arm, Patient Position: Sitting, Cuff Size: Normal)   Pulse 90   Temp 99.2 F (37.3 C) (Oral)   Ht 5\' 1"  (1.549 m)   Wt 146 lb (66.2 kg)   LMP 04/06/2011   BMI 27.59 kg/m   Physical Examination:  General: Awake, alert, chronically ill appearing female Cardio: slightly tachycardic, regular rhythm, S1S2 heard, 2/6 SEM along the LSB appreciated Pulm: clear to auscultation bilaterally, no wheezes, rhonchi or rales; normal work of breathing on room air Extremities:   Left hand: There is a 3 cm x 5 cm erythematous, fluctuant area with increased warmth overlying dorsal aspect of the hand between the first and second digits. This area is tender to palpation. She also has about a 2 x 1 cm area of hyperpigmentation posterior to the thenar eminence. She has  decreased active range of motion secondary to swelling and pain. +2 cap refill to finger tips.  Light touch sensation in tact. MSK: antalgic gait w/ marked kyphosis Skin: see above  Assessment/ Plan: 61 y.o. female   Cellulitis and abscess of hand  Heroin abuse  Heart murmur, systolic  Patient initially meeting SIRS criteria with a heart rate at 116. Upon recheck heart rate had gone down to 90. Blood pressure slightly elevated.  Additionally, she has a low-grade fever to 42F. She has a history of heroine use. I have a high suspicion that IV drug use may be contributing to the left hand swelling and concurrent infection. I recommended the patient go immediately to the emergency department for evaluation, drainage, cultures and likely initiation of IV antibiotics and admission. She will likely require consult to hand surgery for incision and drainage of fluctuant area. I reinforced the urgent/emergent nature of seeking care in the emergency department. I discussed risks of delaying treatment, including developing severe infection/sepsis, requiring more invasive care and treatment, and the risk of death.  Her exam was also notable for a new heart murmur on exam best heard at the left sternal border. Per patient she has not had a history of heart murmur in the past. Would strongly consider admission for evaluation/endocarditis workup given her current heroin use. She declined transfer via ambulance to [facility]. She is stable enough for self transport. She was discharged with strict instructions to  go directly to the emergency department for evaluation and treatment. She acknowledged and voiced good understanding.  This patient was interviewed and examined in the presence of triage nurse, [place], who again reiterated and reviewed discharge instructions to go immediately to the emergency department.  Total face to face time spent with patient 27 minutes.  Greater than 50% of encounter  spent in coordination of care/ counseling.  Grace Norlander, DO [place] [phone]

## 2016-12-16 ENCOUNTER — Telehealth: Payer: Self-pay | Admitting: Family Medicine

## 2016-12-16 ENCOUNTER — Encounter (HOSPITAL_COMMUNITY): Payer: Self-pay | Admitting: Emergency Medicine

## 2016-12-16 DIAGNOSIS — I10 Essential (primary) hypertension: Secondary | ICD-10-CM | POA: Diagnosis present

## 2016-12-16 DIAGNOSIS — D61818 Other pancytopenia: Secondary | ICD-10-CM | POA: Diagnosis present

## 2016-12-16 DIAGNOSIS — R011 Cardiac murmur, unspecified: Secondary | ICD-10-CM | POA: Diagnosis present

## 2016-12-16 DIAGNOSIS — Z79899 Other long term (current) drug therapy: Secondary | ICD-10-CM

## 2016-12-16 DIAGNOSIS — E876 Hypokalemia: Secondary | ICD-10-CM | POA: Diagnosis present

## 2016-12-16 DIAGNOSIS — E119 Type 2 diabetes mellitus without complications: Secondary | ICD-10-CM | POA: Diagnosis present

## 2016-12-16 DIAGNOSIS — L02512 Cutaneous abscess of left hand: Principal | ICD-10-CM | POA: Diagnosis present

## 2016-12-16 DIAGNOSIS — Z7151 Drug abuse counseling and surveillance of drug abuser: Secondary | ICD-10-CM

## 2016-12-16 DIAGNOSIS — L02519 Cutaneous abscess of unspecified hand: Secondary | ICD-10-CM | POA: Diagnosis not present

## 2016-12-16 DIAGNOSIS — G510 Bell's palsy: Secondary | ICD-10-CM | POA: Diagnosis present

## 2016-12-16 DIAGNOSIS — Z981 Arthrodesis status: Secondary | ICD-10-CM

## 2016-12-16 DIAGNOSIS — K759 Inflammatory liver disease, unspecified: Secondary | ICD-10-CM | POA: Diagnosis present

## 2016-12-16 DIAGNOSIS — M7989 Other specified soft tissue disorders: Secondary | ICD-10-CM | POA: Diagnosis not present

## 2016-12-16 DIAGNOSIS — Z87891 Personal history of nicotine dependence: Secondary | ICD-10-CM

## 2016-12-16 DIAGNOSIS — G8929 Other chronic pain: Secondary | ICD-10-CM | POA: Diagnosis present

## 2016-12-16 DIAGNOSIS — F1120 Opioid dependence, uncomplicated: Secondary | ICD-10-CM | POA: Diagnosis present

## 2016-12-16 LAB — COMPREHENSIVE METABOLIC PANEL
ALBUMIN: 2.3 g/dL — AB (ref 3.5–5.0)
ALT: 51 U/L (ref 14–54)
ANION GAP: 8 (ref 5–15)
AST: 52 U/L — AB (ref 15–41)
Alkaline Phosphatase: 71 U/L (ref 38–126)
BUN: 6 mg/dL (ref 6–20)
CALCIUM: 8.6 mg/dL — AB (ref 8.9–10.3)
CO2: 26 mmol/L (ref 22–32)
Chloride: 100 mmol/L — ABNORMAL LOW (ref 101–111)
Creatinine, Ser: 0.51 mg/dL (ref 0.44–1.00)
GFR calc Af Amer: >60 mL/min (ref >60)
GFR calc non Af Amer: >60 mL/min (ref >60)
GLUCOSE: 248 mg/dL — AB (ref 65–99)
POTASSIUM: 3.3 mmol/L — AB (ref 3.5–5.1)
SODIUM: 134 mmol/L — AB (ref 135–145)
Total Bilirubin: 1.8 mg/dL — ABNORMAL HIGH (ref 0.3–1.2)
Total Protein: 7.9 g/dL (ref 6.5–8.1)

## 2016-12-16 LAB — CBC WITH DIFFERENTIAL/PLATELET
BASOS PCT: 1 %
Basophils Absolute: 0 10*3/uL (ref 0.0–0.1)
EOS ABS: 0 10*3/uL (ref 0.0–0.7)
EOS PCT: 0 %
HEMATOCRIT: 34.4 % — AB (ref 36.0–46.0)
Hemoglobin: 11.9 g/dL — ABNORMAL LOW (ref 12.0–15.0)
LYMPHS PCT: 19 %
Lymphs Abs: 0.7 10*3/uL (ref 0.7–4.0)
MCH: 31.2 pg (ref 26.0–34.0)
MCHC: 34.6 g/dL (ref 30.0–36.0)
MCV: 90.1 fL (ref 78.0–100.0)
MONO ABS: 0.4 10*3/uL (ref 0.1–1.0)
MONOS PCT: 10 %
NEUTROS ABS: 2.7 10*3/uL (ref 1.7–7.7)
Neutrophils Relative %: 70 %
PLATELETS: 91 10*3/uL — AB (ref 150–400)
RBC: 3.82 MIL/uL — ABNORMAL LOW (ref 3.87–5.11)
RDW: 14.7 % (ref 11.5–15.5)
WBC: 3.9 10*3/uL — ABNORMAL LOW (ref 4.0–10.5)

## 2016-12-16 LAB — I-STAT CG4 LACTIC ACID, ED: Lactic Acid, Venous: 1.18 mmol/L (ref 0.5–1.9)

## 2016-12-16 MED ORDER — IBUPROFEN 200 MG PO TABS
ORAL_TABLET | ORAL | Status: AC
  Filled 2016-12-16: qty 2

## 2016-12-16 MED ORDER — IBUPROFEN 400 MG PO TABS
400.0000 mg | ORAL_TABLET | Freq: Once | ORAL | Status: AC | PRN
  Administered 2016-12-16: 400 mg via ORAL

## 2016-12-16 NOTE — Telephone Encounter (Signed)
I attempted to follow up with patient regarding instructions to go to emergency department last evening. I do not see any indication that she [facility] in [facility]. I Attempted to call patient on her cell phone but it goes directly to voicemail. When I called her home phone, her husband picked up. She was not available. I left voice mail for her to contact our office when she is available. Will reiterate the urgency that she should seek care in the emergency department regarding the infection or hand if she is not already done so.  Tanee Henery M. Lajuana Ripple, [facility]

## 2016-12-16 NOTE — ED Triage Notes (Signed)
Pt presents with abscess to L hand, states it has been present X few days. Pt went to doctor yesterday and they instructed her to immediately come to hospital but she didn't until today. Per chart pt is heroin user but pt denies drug use.

## 2016-12-16 NOTE — Telephone Encounter (Signed)
Awesome follow - through. Thanks! Cletus Gash

## 2016-12-17 ENCOUNTER — Encounter (HOSPITAL_COMMUNITY): Payer: Self-pay | Admitting: Anesthesiology

## 2016-12-17 ENCOUNTER — Observation Stay (HOSPITAL_COMMUNITY): Payer: Medicare Other | Admitting: Anesthesiology

## 2016-12-17 ENCOUNTER — Inpatient Hospital Stay (HOSPITAL_COMMUNITY)
Admission: EM | Admit: 2016-12-17 | Discharge: 2016-12-20 | DRG: 580 | Disposition: A | Discharge status: Home / Self Care | Payer: Medicare Other | Attending: Internal Medicine | Admitting: Internal Medicine

## 2016-12-17 ENCOUNTER — Encounter (HOSPITAL_COMMUNITY)
Admission: EM | Disposition: A | Discharge status: Home / Self Care | Payer: Self-pay | Source: Home / Self Care | Attending: Internal Medicine

## 2016-12-17 ENCOUNTER — Emergency Department (HOSPITAL_COMMUNITY): Payer: Medicare Other

## 2016-12-17 DIAGNOSIS — L02512 Cutaneous abscess of left hand: Secondary | ICD-10-CM | POA: Diagnosis not present

## 2016-12-17 DIAGNOSIS — L02519 Cutaneous abscess of unspecified hand: Secondary | ICD-10-CM

## 2016-12-17 DIAGNOSIS — R011 Cardiac murmur, unspecified: Secondary | ICD-10-CM | POA: Diagnosis not present

## 2016-12-17 DIAGNOSIS — M7989 Other specified soft tissue disorders: Secondary | ICD-10-CM | POA: Diagnosis not present

## 2016-12-17 DIAGNOSIS — E119 Type 2 diabetes mellitus without complications: Secondary | ICD-10-CM

## 2016-12-17 DIAGNOSIS — I10 Essential (primary) hypertension: Secondary | ICD-10-CM | POA: Diagnosis present

## 2016-12-17 DIAGNOSIS — L03012 Cellulitis of left finger: Secondary | ICD-10-CM | POA: Diagnosis not present

## 2016-12-17 DIAGNOSIS — F1110 Opioid abuse, uncomplicated: Secondary | ICD-10-CM | POA: Diagnosis present

## 2016-12-17 DIAGNOSIS — M5000 Cervical disc disorder with myelopathy, unspecified cervical region: Secondary | ICD-10-CM | POA: Diagnosis not present

## 2016-12-17 HISTORY — PX: I & D EXTREMITY: SHX5045

## 2016-12-17 HISTORY — PX: INCISION / DRAINAGE HAND / FINGER: SUR695

## 2016-12-17 LAB — HEMOGLOBIN A1C
HEMOGLOBIN A1C: 9.7 % — AB (ref 4.8–5.6)
MEAN PLASMA GLUCOSE: 231.69 mg/dL

## 2016-12-17 LAB — I-STAT CG4 LACTIC ACID, ED: LACTIC ACID, VENOUS: 1.17 mmol/L (ref 0.5–1.9)

## 2016-12-17 LAB — GLUCOSE, CAPILLARY
GLUCOSE-CAPILLARY: 249 mg/dL — AB (ref 65–99)
Glucose-Capillary: 276 mg/dL — ABNORMAL HIGH (ref 65–99)

## 2016-12-17 SURGERY — IRRIGATION AND DEBRIDEMENT EXTREMITY
Anesthesia: General | Site: Hand | Laterality: Left

## 2016-12-17 MED ORDER — PHENYLEPHRINE 40 MCG/ML (10ML) SYRINGE FOR IV PUSH (FOR BLOOD PRESSURE SUPPORT)
PREFILLED_SYRINGE | INTRAVENOUS | Status: AC
  Filled 2016-12-17: qty 10

## 2016-12-17 MED ORDER — ACETAMINOPHEN 325 MG PO TABS
650.0000 mg | ORAL_TABLET | Freq: Four times a day (QID) | ORAL | Status: DC | PRN
  Administered 2016-12-17 – 2016-12-20 (×7): 650 mg via ORAL
  Filled 2016-12-17 (×7): qty 2

## 2016-12-17 MED ORDER — KETAMINE HCL 10 MG/ML IJ SOLN
INTRAMUSCULAR | Status: DC | PRN
  Administered 2016-12-17: 20 mg via INTRAVENOUS
  Administered 2016-12-17: 40 mg via INTRAVENOUS

## 2016-12-17 MED ORDER — ACETAMINOPHEN 650 MG RE SUPP: 650.0000 mg | Freq: Four times a day (QID) | RECTAL | Status: DC | PRN

## 2016-12-17 MED ORDER — LIDOCAINE HCL (CARDIAC) 20 MG/ML IV SOLN
INTRAVENOUS | Status: DC | PRN
  Administered 2016-12-17: 50 mg via INTRAVENOUS

## 2016-12-17 MED ORDER — KETAMINE HCL-SODIUM CHLORIDE 100-0.9 MG/10ML-% IV SOSY
PREFILLED_SYRINGE | INTRAVENOUS | Status: AC
  Filled 2016-12-17: qty 10

## 2016-12-17 MED ORDER — VANCOMYCIN HCL IN DEXTROSE 1-5 GM/200ML-% IV SOLN
1000.0000 mg | Freq: Two times a day (BID) | INTRAVENOUS | Status: DC
  Administered 2016-12-17 – 2016-12-20 (×6): 1000 mg via INTRAVENOUS
  Filled 2016-12-17 (×7): qty 200

## 2016-12-17 MED ORDER — AMLODIPINE BESYLATE 5 MG PO TABS
5.0000 mg | ORAL_TABLET | Freq: Every day | ORAL | Status: DC
  Administered 2016-12-17 – 2016-12-20 (×4): 5 mg via ORAL
  Filled 2016-12-17 (×4): qty 1

## 2016-12-17 MED ORDER — VANCOMYCIN HCL IN DEXTROSE 1-5 GM/200ML-% IV SOLN
1000.0000 mg | Freq: Once | INTRAVENOUS | Status: AC
  Administered 2016-12-17: 1000 mg via INTRAVENOUS
  Filled 2016-12-17: qty 200

## 2016-12-17 MED ORDER — MIDAZOLAM HCL 2 MG/2ML IJ SOLN
INTRAMUSCULAR | Status: AC
  Filled 2016-12-17: qty 2

## 2016-12-17 MED ORDER — SODIUM CHLORIDE 0.9 % IR SOLN
Status: DC | PRN
Start: 2016-12-17 — End: 2016-12-17
  Administered 2016-12-17: 1000 mL

## 2016-12-17 MED ORDER — ONDANSETRON HCL 4 MG PO TABS: 4.0000 mg | ORAL_TABLET | Freq: Four times a day (QID) | ORAL | Status: DC | PRN

## 2016-12-17 MED ORDER — FENTANYL CITRATE (PF) 100 MCG/2ML IJ SOLN
INTRAMUSCULAR | Status: DC | PRN
  Administered 2016-12-17 (×2): 100 ug via INTRAVENOUS
  Administered 2016-12-17: 50 ug via INTRAVENOUS

## 2016-12-17 MED ORDER — HYDROMORPHONE HCL 1 MG/ML IJ SOLN: 0.2500 mg | INTRAMUSCULAR | Status: DC | PRN

## 2016-12-17 MED ORDER — METOPROLOL SUCCINATE ER 50 MG PO TB24
50.0000 mg | ORAL_TABLET | Freq: Every day | ORAL | Status: DC
  Administered 2016-12-17 – 2016-12-20 (×4): 50 mg via ORAL
  Filled 2016-12-17 (×4): qty 1

## 2016-12-17 MED ORDER — POTASSIUM CHLORIDE CRYS ER 20 MEQ PO TBCR
40.0000 meq | EXTENDED_RELEASE_TABLET | Freq: Once | ORAL | Status: AC
  Administered 2016-12-17: 40 meq via ORAL
  Filled 2016-12-17: qty 2

## 2016-12-17 MED ORDER — ONDANSETRON HCL 4 MG/2ML IJ SOLN
INTRAMUSCULAR | Status: DC | PRN
  Administered 2016-12-17: 4 mg via INTRAVENOUS

## 2016-12-17 MED ORDER — OXYCODONE HCL 5 MG PO TABS: 5.0000 mg | ORAL_TABLET | Freq: Once | ORAL | Status: DC | PRN

## 2016-12-17 MED ORDER — LACTATED RINGERS IV SOLN
INTRAVENOUS | Status: DC | PRN
  Administered 2016-12-17: 06:00:00 via INTRAVENOUS

## 2016-12-17 MED ORDER — ONDANSETRON HCL 4 MG/2ML IJ SOLN
INTRAMUSCULAR | Status: AC
  Filled 2016-12-17: qty 2

## 2016-12-17 MED ORDER — ACETAMINOPHEN 10 MG/ML IV SOLN
INTRAVENOUS | Status: DC | PRN
  Administered 2016-12-17: 1000 mg via INTRAVENOUS

## 2016-12-17 MED ORDER — SODIUM CHLORIDE 0.9 % IR SOLN
Status: DC | PRN
  Administered 2016-12-17: 3000 mL

## 2016-12-17 MED ORDER — ONDANSETRON HCL 4 MG/2ML IJ SOLN: 4.0000 mg | Freq: Four times a day (QID) | INTRAMUSCULAR | Status: DC | PRN

## 2016-12-17 MED ORDER — PROPOFOL 10 MG/ML IV BOLUS
INTRAVENOUS | Status: DC | PRN
  Administered 2016-12-17: 170 mg via INTRAVENOUS

## 2016-12-17 MED ORDER — MIDAZOLAM HCL 5 MG/5ML IJ SOLN
INTRAMUSCULAR | Status: DC | PRN
  Administered 2016-12-17: 2 mg via INTRAVENOUS

## 2016-12-17 MED ORDER — KETOROLAC TROMETHAMINE 30 MG/ML IJ SOLN
30.0000 mg | Freq: Four times a day (QID) | INTRAMUSCULAR | Status: DC | PRN
  Administered 2016-12-17 – 2016-12-20 (×8): 30 mg via INTRAVENOUS
  Filled 2016-12-17 (×8): qty 1

## 2016-12-17 MED ORDER — LIDOCAINE 2% (20 MG/ML) 5 ML SYRINGE
INTRAMUSCULAR | Status: AC
  Filled 2016-12-17: qty 5

## 2016-12-17 MED ORDER — PROPOFOL 10 MG/ML IV BOLUS
INTRAVENOUS | Status: AC
  Filled 2016-12-17: qty 20

## 2016-12-17 MED ORDER — INSULIN ASPART 100 UNIT/ML ~~LOC~~ SOLN
0.0000 [IU] | Freq: Three times a day (TID) | SUBCUTANEOUS | Status: DC
  Administered 2016-12-17 – 2016-12-18 (×2): 5 [IU] via SUBCUTANEOUS

## 2016-12-17 MED ORDER — ACETAMINOPHEN 10 MG/ML IV SOLN
INTRAVENOUS | Status: AC
  Filled 2016-12-17: qty 100

## 2016-12-17 MED ORDER — OXYCODONE HCL 5 MG/5ML PO SOLN: 5.0000 mg | Freq: Once | ORAL | Status: DC | PRN

## 2016-12-17 MED ORDER — FENTANYL CITRATE (PF) 250 MCG/5ML IJ SOLN
INTRAMUSCULAR | Status: AC
  Filled 2016-12-17: qty 5

## 2016-12-17 MED ORDER — CYCLOBENZAPRINE HCL 10 MG PO TABS
10.0000 mg | ORAL_TABLET | Freq: Two times a day (BID) | ORAL | Status: DC | PRN
  Administered 2016-12-17 – 2016-12-19 (×4): 10 mg via ORAL
  Filled 2016-12-17 (×4): qty 1

## 2016-12-17 SURGICAL SUPPLY — 51 items
BANDAGE ACE 3X5.8 VEL STRL LF (GAUZE/BANDAGES/DRESSINGS) ×2 IMPLANT
BANDAGE ACE 4X5 VEL STRL LF (GAUZE/BANDAGES/DRESSINGS) ×2 IMPLANT
BNDG CMPR 9X4 STRL LF SNTH (GAUZE/BANDAGES/DRESSINGS) ×1
BNDG COHESIVE 1X5 TAN STRL LF (GAUZE/BANDAGES/DRESSINGS) IMPLANT
BNDG CONFORM 2 STRL LF (GAUZE/BANDAGES/DRESSINGS) IMPLANT
BNDG ESMARK 4X9 LF (GAUZE/BANDAGES/DRESSINGS) ×2 IMPLANT
BNDG GAUZE ELAST 4 BULKY (GAUZE/BANDAGES/DRESSINGS) ×2 IMPLANT
CORDS BIPOLAR (ELECTRODE) ×2 IMPLANT
COVER SURGICAL LIGHT HANDLE (MISCELLANEOUS) ×2 IMPLANT
CUFF TOURNIQUET SINGLE 18IN (TOURNIQUET CUFF) ×2 IMPLANT
CUFF TOURNIQUET SINGLE 24IN (TOURNIQUET CUFF) IMPLANT
DRAIN PENROSE 1/4X12 LTX STRL (WOUND CARE) IMPLANT
DRAPE SURG 17X23 STRL (DRAPES) ×2 IMPLANT
DRSG ADAPTIC 3X8 NADH LF (GAUZE/BANDAGES/DRESSINGS) ×2 IMPLANT
ELECT REM PT RETURN 9FT ADLT (ELECTROSURGICAL)
ELECTRODE REM PT RTRN 9FT ADLT (ELECTROSURGICAL) IMPLANT
GAUZE SPONGE 4X4 12PLY STRL (GAUZE/BANDAGES/DRESSINGS) ×2 IMPLANT
GAUZE XEROFORM 1X8 LF (GAUZE/BANDAGES/DRESSINGS) ×2 IMPLANT
GAUZE XEROFORM 5X9 LF (GAUZE/BANDAGES/DRESSINGS) IMPLANT
GLOVE BIOGEL PI IND STRL 8.5 (GLOVE) ×1 IMPLANT
GLOVE BIOGEL PI INDICATOR 8.5 (GLOVE) ×1
GLOVE SURG ORTHO 8.0 STRL STRW (GLOVE) ×2 IMPLANT
GOWN STRL REUS W/ TWL LRG LVL3 (GOWN DISPOSABLE) ×3 IMPLANT
GOWN STRL REUS W/ TWL XL LVL3 (GOWN DISPOSABLE) ×1 IMPLANT
GOWN STRL REUS W/TWL LRG LVL3 (GOWN DISPOSABLE) ×6
GOWN STRL REUS W/TWL XL LVL3 (GOWN DISPOSABLE) ×2
HANDPIECE INTERPULSE COAX TIP (DISPOSABLE)
KIT BASIN OR (CUSTOM PROCEDURE TRAY) ×2 IMPLANT
KIT ROOM TURNOVER OR (KITS) ×2 IMPLANT
MANIFOLD NEPTUNE II (INSTRUMENTS) ×2 IMPLANT
NDL HYPO 25GX1X1/2 BEV (NEEDLE) IMPLANT
NEEDLE HYPO 25GX1X1/2 BEV (NEEDLE) IMPLANT
NS IRRIG 1000ML POUR BTL (IV SOLUTION) ×2 IMPLANT
PACK ORTHO EXTREMITY (CUSTOM PROCEDURE TRAY) ×2 IMPLANT
PAD ARMBOARD 7.5X6 YLW CONV (MISCELLANEOUS) ×4 IMPLANT
PAD CAST 4YDX4 CTTN HI CHSV (CAST SUPPLIES) ×1 IMPLANT
PADDING CAST COTTON 4X4 STRL (CAST SUPPLIES) ×2
SET HNDPC FAN SPRY TIP SCT (DISPOSABLE) IMPLANT
SOAP 2 % CHG 4 OZ (WOUND CARE) ×2 IMPLANT
SUCTION FRAZIER HANDLE 10FR (MISCELLANEOUS) ×1
SUCTION TUBE FRAZIER 10FR DISP (MISCELLANEOUS) ×1 IMPLANT
SUT ETHILON 4 0 PS 2 18 (SUTURE) IMPLANT
SUT ETHILON 5 0 P 3 18 (SUTURE)
SUT NYLON ETHILON 5-0 P-3 1X18 (SUTURE) IMPLANT
SWAB CULTURE ESWAB REG 1ML (MISCELLANEOUS) IMPLANT
SYR CONTROL 10ML LL (SYRINGE) IMPLANT
TOWEL OR 17X24 6PK STRL BLUE (TOWEL DISPOSABLE) ×2 IMPLANT
TOWEL OR 17X26 10 PK STRL BLUE (TOWEL DISPOSABLE) ×2 IMPLANT
TUBE CONNECTING 12X1/4 (SUCTIONS) ×2 IMPLANT
UNDERPAD 30X30 (UNDERPADS AND DIAPERS) ×2 IMPLANT
YANKAUER SUCT BULB TIP NO VENT (SUCTIONS) ×2 IMPLANT

## 2016-12-17 NOTE — Anesthesia Procedure Notes (Signed)
Procedure Name: LMA Insertion Date/Time: 12/17/2016 6:36 AM Performed by: Greggory Stallion, Ieisha Gao L Pre-anesthesia Checklist: Patient identified, Emergency Drugs available, Suction available and Patient being monitored Patient Re-evaluated:Patient Re-evaluated prior to induction Oxygen Delivery Method: Circle System Utilized Preoxygenation: Pre-oxygenation with 100% oxygen Induction Type: IV induction Ventilation: Mask ventilation without difficulty LMA: LMA inserted LMA Size: 4.0 Number of attempts: 1 Airway Equipment and Method: Bite block Placement Confirmation: positive ETCO2 Tube secured with: Tape Dental Injury: Teeth and Oropharynx as per pre-operative assessment

## 2016-12-17 NOTE — ED Notes (Signed)
Patient transported to X-ray 

## 2016-12-17 NOTE — Progress Notes (Signed)
Inpatient Diabetes Program Recommendations  AACE/ADA: New Consensus Statement on Inpatient Glycemic Control (2015)  Target Ranges:  Prepandial:   less than 140 mg/dL      Peak postprandial:   less than 180 mg/dL (1-2 hours)      Critically ill patients:  140 - 180 mg/dL   Lab Results  Component Value Date   GLUCAP 138 (H) 07/09/2013    Review of Glycemic Control- lab glucose  Results for Grace Stokes, Grace Stokes (MRN [id number]) as of 12/17/2016 08:53  Ref. Range 12/16/2016 20:44 12/17/2016 03:38 12/17/2016 04:00 12/17/2016 04:24 12/17/2016 06:46  Glucose Latest Ref Range: 65 - 99 mg/dL 248 (H)        Diabetes history: none noted Outpatient Diabetes medications: none noted Current orders for Inpatient glycemic control: none  Inpatient Diabetes Program Recommendations:  Lab glucose elevated - yesterday.  Documented elevated CBG in May 2018, October 2017 and March 2015 but no A1C noted.   Per ADA recommendations "consider performing an A1C on all patients with diabetes or hyperglycemia admitted to the hospital if not performed in the prior 3 months".   Gentry Fitz, RN, BA, MHA, CDE Diabetes Coordinator Inpatient Diabetes Program  [phone] (Team Pager) [phone] ([place]) 12/17/2016 8:56 AM

## 2016-12-17 NOTE — Op Note (Signed)
PREOPERATIVE DIAGNOSIS: Left dorsal hand abscess  POSTOPERATIVE DIAGNOSIS: Same  ATTENDING SURGEON: Dr. Gavin Pound  ASSISTANT SURGEON: None  ANESTHESIA: Gen. via laryngeal mask airway  OPERATIVE PROCEDURE: Left hand incision and drainage and excisional debridement of the medial devitalized tissue dorsal hand abscess  IMPLANTS: None  RADIOGRAPHIC INTERPRETATION: None  SURGICAL INDICATIONS: Grace Stokes is a right-hand-dominant female with chronic pain who presented to her primary care physician greater than 24 hours ago with a dorsal hand problem. Patient presented to the ER last night at 8:00 night with this problem. I was consult with early this a.m. for the abscess. Patient has a large dorsal hand abscess was recommended that she undergo incision and drainage. Risks benefits and alternatives were discussed in detail with the patient in a signed informed consent was obtained  SURGICAL TECHNIQUE: Patient was properly identified in the preoperative holding area and a mark with a permanent marker made on the left hand indicate correct operative site. Patient brought back to the operating room and placed supine on anesthesia and table where general anesthesia was administered patient tolerates well. A well-padded tourniquet was then placed in left brachium and sealed with a 1000 drape. Left upper extremities and prepped and draped in normal sterile fashion timeout was called cracks I was notified the procedure then begun. Attention then turned the dorsal aspect of the hand. Longitudinal incision made directly over the abscess between the index and wound webspace. Gross purulence was immediately encountered. The wound was then cultured aerobic and anaerobic cultures taken. Drainage of the abscess area was then thoroughly done. Debridement of devitalized tissue and skin was then carried out sharp excisional debridement was then carried out. The wound was then thoroughly irrigated. The abscess area was  then closed loosely over packing gauze with nylon suture. Small portion of packing gauze was left in the area allowed to drain. Adaptic dressing sterile compressive bandage then applied. Patient tolerated procedure well returned recovery room after being placed in well-padded thumb spica splint.  POSTOPERATIVE PLAN: Patient admitted to the internal medicine service for IV antibiotics and pain control. The patient will need to have the wound changed then 24-48 hours take the packing gauze out and then reapply the splint. Wound care service nursing can change the splint and dressing. Today to follow up with me in the office next week. Please contact me should any issues or concerns arise

## 2016-12-17 NOTE — Progress Notes (Signed)
Pharmacy Antibiotic Note  Grace Stokes is a 61 y.o. female admitted on 12/17/2016 with left hand abcess.  Pharmacy has been consulted for Vancomycin  dosing.  Plan: Vancomycin 1 g IV q12h  Height: 5\' 1"  (154.9 cm) Weight: 145 lb (65.8 kg) IBW/kg (Calculated) : 47.8  Temp (24hrs), Avg:97.8 F (36.6 C), Min:97.2 F (36.2 C), Max:98.1 F (36.7 C)   Recent Labs Lab 12/16/16 2031 12/16/16 2044 12/17/16 0424  WBC  --  3.9*  --   CREATININE  --  0.51  --   LATICACIDVEN 1.18  --  1.17    Estimated Creatinine Clearance: 64.1 mL/min (by C-G formula based on SCr of 0.51 mg/dL).    No Known Allergies    Caryl Pina 12/17/2016 8:13 AM

## 2016-12-17 NOTE — Anesthesia Preprocedure Evaluation (Signed)
Anesthesia Evaluation  Patient identified by MRN, date of birth, ID band Patient awake    Reviewed: Allergy & Precautions, H&P , NPO status , Patient's Chart, lab work & pertinent test results  Airway Mallampati: II   Neck ROM: full    Dental   Pulmonary former smoker,    breath sounds clear to auscultation       Cardiovascular hypertension,  Rhythm:regular Rate:Normal     Neuro/Psych  Neuromuscular disease    GI/Hepatic (+)     substance abuse  IV drug use,   Endo/Other    Renal/GU      Musculoskeletal  (+) Arthritis ,   Abdominal   Peds  Hematology   Anesthesia Other Findings   Reproductive/Obstetrics                             Anesthesia Physical Anesthesia Plan  ASA: III  Anesthesia Plan: General   Post-op Pain Management:    Induction: Intravenous  PONV Risk Score and Plan: 3 and Ondansetron, Dexamethasone, Midazolam and Treatment may vary due to age or medical condition  Airway Management Planned: LMA  Additional Equipment:   Intra-op Plan:   Post-operative Plan:   Informed Consent: I have reviewed the patients History and Physical, chart, labs and discussed the procedure including the risks, benefits and alternatives for the proposed anesthesia with the patient or authorized representative who has indicated his/her understanding and acceptance.     Plan Discussed with: CRNA, Anesthesiologist and Surgeon  Anesthesia Plan Comments:         Anesthesia Quick Evaluation

## 2016-12-17 NOTE — Progress Notes (Addendum)
PROGRESS NOTE   Grace Stokes  ZOX:[id number]    DOB: 1955-08-10    DOA: 12/17/2016  PCP: Claretta Fraise, MD   I have briefly reviewed patients previous medical records in [facility].  Brief Narrative:  61 year old female with PMH of tobacco abuse, IVDA, Bell's palsy, chronic pain, presented to ED with abscess of left hand after she sustained injury while working on her truck 3 days PTA. She reports doing IV heroin on right forearm within the last month but denies doing it in the involved left upper extremity. Status post I&D by orthopedics on 9/6.   Assessment & Plan:   Principal Problem:   Abscess of left hand Active Problems:   Heroin abuse   HTN (hypertension)   1. Left hand abscess: Status post I&D by hand surgery on 9/6. Continue IV vancomycin and recommendations per hand surgery. Follow blood and wound culture results. 2. Pancytopenia: Unclear etiology. Follow CBC in a.m. Patient denies alcohol use. Review of prior labs shows hemoglobin of 12.1 in May 2018 but leukopenia and thrombocytopenia appear chronic.? Related to hepatitis (hep B surface antigen and hepatitis B core antibody IgM positive in 2017) 3. Hypokalemia: Replace and follow. 4. Essential hypertension: Mildly uncontrolled. Continue amlodipine and metoprolol. 5. Hyperglycemia, rule out DM: Check A1c and place on NovoLog SSI. Diabetic diet. 6. Tobacco abuse: Cessation counseled. 7. IVDA/heroin: Cessation counseled. 8. ? Hepatitis: Outpatient follow-up via PCP.   DVT prophylaxis: SCDs Code Status: Full Family Communication: None at bedside Disposition: DC home when medically improved, possibly in the next 2-3 days.   Consultants:  Orthopedic/hand surgery   Procedures:  I&D of left hand abscess on 9/6  Antimicrobials:  IV vancomycin    Subjective: Seen post surgery. Slightly somnolent but easily arousable. Reports appropriate mild surgical site pain. Reports that she smokes 1 pack cigarettes per day  until hospitalization. Reports that she did IV heroin in her left forearm within the last month.   ROS: No chest pain, dizziness, fever, chills or dyspnea.  Objective:  Vitals:   12/17/16 0800 12/17/16 0815 12/17/16 0837 12/17/16 1437  BP: (!) 174/99 (!) 169/97 (!) 165/77 127/71  Pulse: 68  62 60  Resp: 18 16 16    Temp:  (!) 97.3 F (36.3 C) (!) 97.5 F (36.4 C) 98.2 F (36.8 C)  TempSrc:   Oral Oral  SpO2: 98%  99% 100%  Weight:      Height:        Examination:  General exam: Pleasant middle-aged female, small built and thinly nourished, lying comfortably propped up in bed. Respiratory system: Clear to auscultation. Respiratory effort normal. Cardiovascular system: S1 & S2 heard, RRR. No JVD, murmurs, rubs, gallops or clicks. No pedal edema. Gastrointestinal system: Abdomen is nondistended, soft and nontender. No organomegaly or masses felt. Normal bowel sounds heard. Central nervous system: Alert and oriented. No focal neurological deficits. Extremities: Symmetric 5 x 5 power. Left upper extremity postop dressing clean and dry. Skin: No rashes, lesions or ulcers Psychiatry: Judgement and insight appear normal. Mood & affect appropriate.     Data Reviewed: I have personally reviewed following labs and imaging studies  CBC:  Recent Labs Lab 12/16/16 2044  WBC 3.9*  NEUTROABS 2.7  HGB 11.9*  HCT 34.4*  MCV 90.1  PLT 91*   Basic Metabolic Panel:  Recent Labs Lab 12/16/16 2044  NA 134*  K 3.3*  CL 100*  CO2 26  GLUCOSE 248*  BUN 6  CREATININE 0.51  CALCIUM 8.6*   Liver Function Tests:  Recent Labs Lab 12/16/16 2044  AST 52*  ALT 51  ALKPHOS 71  BILITOT 1.8*  PROT 7.9  ALBUMIN 2.3*   Coagulation Profile: No results for input(s): INR, PROTIME in the last 168 hours. Cardiac Enzymes: No results for input(s): CKTOTAL, CKMB, CKMBINDEX, TROPONINI in the last 168 hours. HbA1C: No results for input(s): HGBA1C in the last 72 hours. CBG: No results  for input(s): GLUCAP in the last 168 hours.  Recent Results (from the past 240 hour(s))  Aerobic/Anaerobic Culture (surgical/deep wound)     Status: None (Preliminary result)   Collection Time: 12/17/16  6:46 AM  Result Value Ref Range Status   Specimen Description ABSCESS LEFT HAND  Final   Special Requests NONE  Final   Gram Stain   Final    ABUNDANT WBC PRESENT,BOTH PMN AND MONONUCLEAR ABUNDANT GRAM POSITIVE COCCI IN PAIRS IN CHAINS    Culture PENDING  Incomplete   Report Status PENDING  Incomplete         Radiology Studies: Dg Hand Complete Left  Result Date: 12/17/2016 CLINICAL DATA:  Left hand swelling for 3 days.  No known injury. EXAM: LEFT HAND - COMPLETE 3+ VIEW COMPARISON:  None. FINDINGS: Diffuse bone demineralization. Degenerative changes in the interphalangeal joints. Old displaced fracture fragment off of the ulnar styloid process. No acute fracture or dislocation is identified. Soft tissue swelling over the dorsum of the left hand. Soft tissue swelling at the phalanges. Suggestion of soft tissue gas in the proximal left second finger. This could indicate infection with gas-forming organism. No radiopaque soft tissue foreign bodies. No radiographic changes to suggest osteomyelitis. IMPRESSION: No acute bony abnormalities. Diffuse soft tissue swelling of the left hand. Suggestion of soft tissue gas in the left second finger. Electronically Signed   By: Lucienne Capers M.D.   On: 12/17/2016 04:18        Scheduled Meds: . amLODipine  5 mg Oral Daily  . metoprolol succinate  50 mg Oral Daily   Continuous Infusions: . vancomycin       LOS: 0 days     Shiloh Swopes, MD, FACP, FHM. [facility] Pager [phone]  If 7PM-7AM, please contact night-coverage [email or web address] Password [email or web address] 12/17/2016, 4:16 PM

## 2016-12-17 NOTE — Consult Note (Signed)
Reason for Consult:left hand abscess Referring Physician: ED  Grace Stokes is an 61 y.o. female.  HPI: Per the chart "Pt presents with abscess to L hand, states it has been present X few days. Pt went to doctor yesterday and they instructed her to immediately come to hospital but she didn't until today. Per chart pt is heroin user but pt denies drug use."  The patient presented to the emergency department at 8:07 PM. The notes document that the patient was seen by the ER provider over 7 hours later for an abscess. I was called to evaluate the patient 2 hours later.  The patient has an abscess and is recommended that the patient undergo incision and drainage.  Past Medical History:  Diagnosis Date  . Arthritis   . Bell's palsy   . Cataract   . Chronic pain   . Renal disorder   . Scoliosis   . Toxemia in pregnancy     Past Surgical History:  Procedure Laterality Date  . ANTERIOR CERVICAL DECOMP/DISCECTOMY FUSION N/A 07/09/2013   Procedure: ANTERIOR CERVICAL DECOMPRESSION/DISCECTOMY FUSION 2 LEVELS;  Surgeon: Neelesh Nundkumar, MD;  Location: [facility];  Service: Neurosurgery;  Laterality: N/A;  Anterior Cervical Fusion Cervical five-six, six-seven.  . BACK SURGERY    . CARPAL TUNNEL RELEASE    . CESAREAN SECTION    . ROTATOR CUFF REPAIR    . SHOULDER SURGERY    . TUBAL LIGATION      History reviewed. No pertinent family history.  Social History:  reports that she quit smoking about 2 months ago. Her smoking use included Cigarettes. She has never used smokeless tobacco. She reports that she uses drugs. She reports that she does not drink alcohol.  Allergies: No Known Allergies  Medications: I have reviewed the patient's current medications.  Results for orders placed or performed during the hospital encounter of 12/17/16 (from the past 48 hour(s))  I-Stat CG4 Lactic Acid, ED     Status: None   Collection Time: 12/16/16  8:31 PM  Result Value Ref Range   Lactic Acid,  Venous 1.18 0.5 - 1.9 mmol/L  Comprehensive metabolic panel     Status: Abnormal   Collection Time: 12/16/16  8:44 PM  Result Value Ref Range   Sodium 134 (L) 135 - 145 mmol/L   Potassium 3.3 (L) 3.5 - 5.1 mmol/L   Chloride 100 (L) 101 - 111 mmol/L   CO2 26 22 - 32 mmol/L   Glucose, Bld 248 (H) 65 - 99 mg/dL   BUN 6 6 - 20 mg/dL   Creatinine, Ser 0.51 0.44 - 1.00 mg/dL   Calcium 8.6 (L) 8.9 - 10.3 mg/dL   Total Protein 7.9 6.5 - 8.1 g/dL   Albumin 2.3 (L) 3.5 - 5.0 g/dL   AST 52 (H) 15 - 41 U/L   ALT 51 14 - 54 U/L   Alkaline Phosphatase 71 38 - 126 U/L   Total Bilirubin 1.8 (H) 0.3 - 1.2 mg/dL   GFR calc non Af Amer >60 >60 mL/min   GFR calc Af Amer >60 >60 mL/min    Comment: (NOTE) The eGFR has been calculated using the CKD EPI equation. This calculation has not been validated in all clinical situations. eGFR's persistently <60 mL/min signify possible Chronic Kidney Disease.    Anion gap 8 5 - 15  CBC with Differential     Status: Abnormal   Collection Time: 12/16/16  8:44 PM  Result Value Ref Range     WBC 3.9 (L) 4.0 - 10.5 K/uL   RBC 3.82 (L) 3.87 - 5.11 MIL/uL   Hemoglobin 11.9 (L) 12.0 - 15.0 g/dL   HCT 34.4 (L) 36.0 - 46.0 %   MCV 90.1 78.0 - 100.0 fL   MCH 31.2 26.0 - 34.0 pg   MCHC 34.6 30.0 - 36.0 g/dL   RDW 14.7 11.5 - 15.5 %   Platelets 91 (L) 150 - 400 K/uL    Comment: REPEATED TO VERIFY SPECIMEN CHECKED FOR CLOTS PLATELET COUNT CONFIRMED BY SMEAR    Neutrophils Relative % 70 %   Neutro Abs 2.7 1.7 - 7.7 K/uL   Lymphocytes Relative 19 %   Lymphs Abs 0.7 0.7 - 4.0 K/uL   Monocytes Relative 10 %   Monocytes Absolute 0.4 0.1 - 1.0 K/uL   Eosinophils Relative 0 %   Eosinophils Absolute 0.0 0.0 - 0.7 K/uL   Basophils Relative 1 %   Basophils Absolute 0.0 0.0 - 0.1 K/uL  I-Stat CG4 Lactic Acid, ED     Status: None   Collection Time: 12/17/16  4:24 AM  Result Value Ref Range   Lactic Acid, Venous 1.17 0.5 - 1.9 mmol/L    Dg Hand Complete Left  Result  Date: 12/17/2016 CLINICAL DATA:  Left hand swelling for 3 days.  No known injury. EXAM: LEFT HAND - COMPLETE 3+ VIEW COMPARISON:  None. FINDINGS: Diffuse bone demineralization. Degenerative changes in the interphalangeal joints. Old displaced fracture fragment off of the ulnar styloid process. No acute fracture or dislocation is identified. Soft tissue swelling over the dorsum of the left hand. Soft tissue swelling at the phalanges. Suggestion of soft tissue gas in the proximal left second finger. This could indicate infection with gas-forming organism. No radiopaque soft tissue foreign bodies. No radiographic changes to suggest osteomyelitis. IMPRESSION: No acute bony abnormalities. Diffuse soft tissue swelling of the left hand. Suggestion of soft tissue gas in the left second finger. Electronically Signed   By: William  Stevens M.D.   On: 12/17/2016 04:18    ROS: No recent illnesses or hospitalizations Blood pressure (!) 162/91, pulse 73, temperature 98.1 F (36.7 C), temperature source Oral, resp. rate 16, height 5' 1" (1.549 m), weight 145 lb (65.8 kg), last menstrual period 04/06/2011, SpO2 96 %. Physical Exam On examination of the left hand the patient has the large fluctuant area between the index and thumb webspace dorsally. The patient has the surrounding erythema. The patient's able gently wiggle her fingers her fingers are warm well perfused she does not have any streaking erythema or lymphangitis. The hand compartments dorsally are swollen but soft.  Assessment/Plan: Left hand abscess  Incision and drainage of left hand abscess  Patient be admitted back to the hospitalist service for intravenous antibiotics and further medical evaluation. Patient was seen and evaluated consent signed. Patient voiced understanding the reason the rationale for drainage of the prominent dorsal abscess.  R/B/A DISCUSSED WITH PT IN HOSPITAL.  PT VOICED UNDERSTANDING OF PLAN CONSENT SIGNED DAY OF SURGERY PT  SEEN AND EXAMINED PRIOR TO OPERATIVE PROCEDURE/DAY OF SURGERY SITE MARKED. QUESTIONS ANSWERED WILL REMAIN AN INPATIENT FOLLOWING SURGERY  WE ARE PLANNING SURGERY FOR YOUR UPPER EXTREMITY. THE RISKS AND BENEFITS OF SURGERY INCLUDE BUT NOT LIMITED TO BLEEDING INFECTION, DAMAGE TO NEARBY NERVES ARTERIES TENDONS, FAILURE OF SURGERY TO ACCOMPLISH ITS INTENDED GOALS, PERSISTENT SYMPTOMS AND NEED FOR FURTHER SURGICAL INTERVENTION. WITH THIS IN MIND WE WILL PROCEED. I HAVE DISCUSSED WITH THE PATIENT THE PRE AND POSTOPERATIVE REGIMEN   AND THE DOS AND DON'TS. PT VOICED UNDERSTANDING AND INFORMED CONSENT SIGNED.  , W 12/17/2016, 6:28 AM  

## 2016-12-17 NOTE — Transfer of Care (Signed)
Immediate Anesthesia Transfer of Care Note  Patient: Grace Stokes  Procedure(s) Performed: Procedure(s): IRRIGATION AND DEBRIDEMENT LEFT HAND (Left)  Patient Location: PACU  Anesthesia Type:General  Level of Consciousness: awake, alert , oriented and sedated  Airway & Oxygen Therapy: Patient Spontanous Breathing and Patient connected to nasal cannula oxygen  Post-op Assessment: Report given to RN, Post -op Vital signs reviewed and stable and Patient moving all extremities  Post vital signs: Reviewed and stable  Last Vitals:  Vitals:   12/17/16 0530 12/17/16 0718  BP: (!) 162/91 (!) 185/95  Pulse: 73 74  Resp:  14  Temp:  (!) 36.2 C  SpO2: 96% 98%    Last Pain:  Vitals:   12/17/16 0318  TempSrc: Oral  PainSc: 6          Complications: No apparent anesthesia complications

## 2016-12-17 NOTE — ED Provider Notes (Signed)
[facility] DEPT Provider Note   CSN: [id number] Arrival date & time: 12/16/16  1924     History   Chief Complaint Chief Complaint  Patient presents with  . Abscess    HPI Grace Stokes is a 61 y.o. female.  The history is provided by the patient and medical records.     61 year old female with history of arthritis, Bell's palsy, chronic pain, scoliosis, heroin abuse, presenting to the ED for abscess of left hand.  Patient reports about 3 days ago her truck stopped working and she popped the hood to work on the car and noticed some swelling afterwards.  She denies any scrapes, cuts, or crush injuries during this event.  States swelling has gotten progressively worse.  She denies any drainage from the area. No fever or chills. States swelling has started to spread into her fingers. Patient is right-hand dominant. Patient denies any drug use recently. She's not been on any oral antibiotics.  Past Medical History:  Diagnosis Date  . Arthritis   . Bell's palsy   . Cataract   . Chronic pain   . Renal disorder   . Scoliosis   . Toxemia in pregnancy     Patient Active Problem List   Diagnosis Date Noted  . Heart murmur, systolic 62/95/2841  . Heroin abuse 12/01/2016  . Cough 08/26/2016  . Smoker 08/26/2016  . Idiopathic scoliosis 01/28/2016  . Opiate addiction ([place]) 01/28/2016  . Cervical myelopathy ([place]) 07/08/2013  . Cervical spondylosis with myelopathy 07/08/2013    Past Surgical History:  Procedure Laterality Date  . ANTERIOR CERVICAL DECOMP/DISCECTOMY FUSION N/A 07/09/2013   Procedure: ANTERIOR CERVICAL DECOMPRESSION/DISCECTOMY FUSION 2 LEVELS;  Surgeon: Consuella Lose, MD;  Location: [facility];  Service: Neurosurgery;  Laterality: N/A;  Anterior Cervical Fusion Cervical five-six, six-seven.  Marland Kitchen BACK SURGERY    . CARPAL TUNNEL RELEASE    . CESAREAN SECTION    . ROTATOR CUFF REPAIR    . SHOULDER SURGERY    . TUBAL LIGATION      OB History    No data  available       Home Medications    Prior to Admission medications   Medication Sig Start Date End Date Taking? Authorizing Provider  amLODipine (NORVASC) 5 MG tablet Take 1 tablet (5 mg total) by mouth daily. For blood pressure 12/01/16   Claretta Fraise, MD  cyclobenzaprine (FLEXERIL) 10 MG tablet Take 1 tablet (10 mg total) by mouth 2 (two) times daily as needed for muscle spasms. 12/01/16   Claretta Fraise, MD  metoprolol succinate (TOPROL-XL) 50 MG 24 hr tablet Take 1 tablet (50 mg total) by mouth daily. For heart and blood pressure 12/01/16   Claretta Fraise, MD    Family History No family history on file.  Social History Social History  Substance Use Topics  . Smoking status: Former Smoker    Types: Cigarettes    Quit date: 09/26/2016  . Smokeless tobacco: Never Used  . Alcohol use No     Allergies   Patient has no known allergies.   Review of Systems Review of Systems  Musculoskeletal: Positive for arthralgias.  Skin: Positive for wound.  All other systems reviewed and are negative.    Physical Exam Updated Vital Signs BP (!) 141/77 (BP Location: Right Arm)   Pulse 88   Temp 98.1 F (36.7 C) (Oral)   Resp 16   Ht 5\' 1"  (1.549 m)   Wt 65.8 kg (145 lb)  LMP 04/06/2011   SpO2 100%   BMI 27.40 kg/m   Physical Exam  Constitutional: She is oriented to person, place, and time. She appears well-developed and well-nourished.  HENT:  Head: Normocephalic and atraumatic.  Mouth/Throat: Oropharynx is clear and moist.  Eyes: Pupils are equal, round, and reactive to light. Conjunctivae and EOM are normal.  Neck: Normal range of motion.  Cardiovascular: Normal rate, regular rhythm and normal heart sounds.   Pulmonary/Chest: Effort normal and breath sounds normal.  Abdominal: Soft. Bowel sounds are normal.  Musculoskeletal: Normal range of motion.  Fairly large abscess of dorsal left hand in between thumb and index finger, there is some area of fluctuance which seems  to be extending into the thenar eminence, there is no active drainage, swelling and cellulitis is starting to spread to the proximal fingers, able to move fingers normally, normal distal sensation and cap refill  Neurological: She is alert and oriented to person, place, and time.  Skin: Skin is warm and dry.  Psychiatric: She has a normal mood and affect.  Nursing note and vitals reviewed.      ED Treatments / Results  Labs (all labs ordered are listed, but only abnormal results are displayed) Labs Reviewed  COMPREHENSIVE METABOLIC PANEL - Abnormal; Notable for the following:       Result Value   Sodium 134 (*)    Potassium 3.3 (*)    Chloride 100 (*)    Glucose, Bld 248 (*)    Calcium 8.6 (*)    Albumin 2.3 (*)    AST 52 (*)    Total Bilirubin 1.8 (*)    All other components within normal limits  CBC WITH DIFFERENTIAL/PLATELET - Abnormal; Notable for the following:    WBC 3.9 (*)    RBC 3.82 (*)    Hemoglobin 11.9 (*)    HCT 34.4 (*)    Platelets 91 (*)    All other components within normal limits  CULTURE, BLOOD (ROUTINE X 2)  CULTURE, BLOOD (ROUTINE X 2)  HIV ANTIBODY (ROUTINE TESTING)  I-STAT CG4 LACTIC ACID, ED  I-STAT CG4 LACTIC ACID, ED    EKG  EKG Interpretation None       Radiology Dg Hand Complete Left  Result Date: 12/17/2016 CLINICAL DATA:  Left hand swelling for 3 days.  No known injury. EXAM: LEFT HAND - COMPLETE 3+ VIEW COMPARISON:  None. FINDINGS: Diffuse bone demineralization. Degenerative changes in the interphalangeal joints. Old displaced fracture fragment off of the ulnar styloid process. No acute fracture or dislocation is identified. Soft tissue swelling over the dorsum of the left hand. Soft tissue swelling at the phalanges. Suggestion of soft tissue gas in the proximal left second finger. This could indicate infection with gas-forming organism. No radiopaque soft tissue foreign bodies. No radiographic changes to suggest osteomyelitis.  IMPRESSION: No acute bony abnormalities. Diffuse soft tissue swelling of the left hand. Suggestion of soft tissue gas in the left second finger. Electronically Signed   By: Lucienne Capers M.D.   On: 12/17/2016 04:18    Procedures Procedures (including critical care time)  Medications Ordered in ED Medications  ibuprofen (ADVIL,MOTRIN) tablet 400 mg (400 mg Oral Given 12/16/16 2012)     Initial Impression / Assessment and Plan / ED Course  I have reviewed the triage vital signs and the nursing notes.  Pertinent labs & imaging results that were available during my care of the patient were reviewed by me and considered in my medical decision  making (see chart for details).  61 year old female here with abscess of left hand. This has been present for a few days. She reports this happened after working on her trunk, however I do have suspicion that there is some IV drug use. Patient does have a history of heroin abuse but she denies this to me. She is afebrile and nontoxic. She has large abscess of the left dorsal hand in the webspace between the thumb and index finger. There is some cellulitis and swelling extending into the proximal aspects of the fingers. There is fluctuance noted but no active drainage. Labwork obtained from triage is overall reassuring. Plain film was obtained revealing soft tissue edema and suggestion of soft tissue gas along the proximal phalanx of the left second finger. There are no findings of osteomyelitis. Blood cultures were obtained and patient was started on IV vancomycin. Discussed with hand surgery, Dr. Caralyn Guile-- will take to the OR this morning for I&D with washout.  Patient updated on care plan.  Patient admitted to hospitalist service for IV abx.  Final Clinical Impressions(s) / ED Diagnoses   Final diagnoses:  Hand abscess    New Prescriptions New Prescriptions   No medications on file     Larene Pickett, PA-C 12/17/16 0551    Orpah Greek, MD 12/20/16 [phone]

## 2016-12-17 NOTE — H&P (Signed)
History and Physical    Grace Stokes KKX:[id number] DOB: [birth year] DOA: 12/17/2016  PCP: Claretta Fraise, MD  Patient coming from: Home  I have personally briefly reviewed patient's old medical records in [place]  Chief Complaint: Abscess  HPI: Grace Stokes is a 61 y.o. female with medical history significant of heroin abuse.  Patient presents to the ED for abscess of L hand.  3 days ago her truck stopped working and injured hand on truck she says.  Denies heroin use recently.  No PO ABx.  Swelling and redness has gotten progressively worse since onset.   ED Course: Put on IV vanc. EDP says they will page hand surgery next.   Review of Systems: As per HPI otherwise 10 point review of systems negative.   Past Medical History:  Diagnosis Date  . Arthritis   . Bell's palsy   . Cataract   . Chronic pain   . Renal disorder   . Scoliosis   . Toxemia in pregnancy     Past Surgical History:  Procedure Laterality Date  . ANTERIOR CERVICAL DECOMP/DISCECTOMY FUSION N/A 07/09/2013   Procedure: ANTERIOR CERVICAL DECOMPRESSION/DISCECTOMY FUSION 2 LEVELS;  Surgeon: Consuella Lose, MD;  Location: [facility];  Service: Neurosurgery;  Laterality: N/A;  Anterior Cervical Fusion Cervical five-six, six-seven.  [facility] Kitchen BACK SURGERY    . CARPAL TUNNEL RELEASE    . CESAREAN SECTION    . ROTATOR CUFF REPAIR    . SHOULDER SURGERY    . TUBAL LIGATION       reports that she quit smoking about 2 months ago. Her smoking use included Cigarettes. She has never used smokeless tobacco. She reports that she uses drugs. She reports that she does not drink alcohol.  No Known Allergies  No family history on file.   Prior to Admission medications   Medication Sig Start Date End Date Taking? Authorizing Provider  amLODipine (NORVASC) 5 MG tablet Take 1 tablet (5 mg total) by mouth daily. For blood pressure 12/01/16  Yes Stacks, Cletus Gash, MD  cyclobenzaprine (FLEXERIL) 10 MG tablet Take 1 tablet  (10 mg total) by mouth 2 (two) times daily as needed for muscle spasms. 12/01/16  Yes Claretta Fraise, MD  metoprolol succinate (TOPROL-XL) 50 MG 24 hr tablet Take 1 tablet (50 mg total) by mouth daily. For heart and blood pressure 12/01/16  Barnabas Lister, MD    Physical Exam: Vitals:   12/17/16 0318 12/17/16 0330 12/17/16 0345 12/17/16 0500  BP: (!) 141/77 136/76 (!) 148/88 133/68  Pulse: 88 76 76 83  Resp: 16     Temp: 98.1 F (36.7 C)     TempSrc: Oral     SpO2: 100% 100% 98% 97%  Weight:      Height:        Constitutional: NAD, calm, comfortable Eyes: PERRL, lids and conjunctivae normal ENMT: Mucous membranes are moist. Posterior pharynx clear of any exudate or lesions.Normal dentition.  Neck: normal, supple, no masses, no thyromegaly Respiratory: clear to auscultation bilaterally, no wheezing, no crackles. Normal respiratory effort. No accessory muscle use.  Cardiovascular: Regular rate and rhythm, no murmurs / rubs / gallops. No extremity edema. 2+ pedal pulses. No carotid bruits.  Abdomen: no tenderness, no masses palpated. No hepatosplenomegaly. Bowel sounds positive.  Musculoskeletal: no clubbing / cyanosis. No joint deformity upper and lower extremities. Good ROM, no contractures. Normal muscle tone.  Skin:  Neurologic: CN 2-12 grossly intact. Sensation intact, DTR normal. Strength 5/5 in  all 4.  Psychiatric: Normal judgment and insight. Alert and oriented x 3. Normal mood.    Labs on Admission: I have personally reviewed following labs and imaging studies  CBC:  Recent Labs Lab 12/16/16 2044  WBC 3.9*  NEUTROABS 2.7  HGB 11.9*  HCT 34.4*  MCV 90.1  PLT 91*   Basic Metabolic Panel:  Recent Labs Lab 12/16/16 2044  NA 134*  K 3.3*  CL 100*  CO2 26  GLUCOSE 248*  BUN 6  CREATININE 0.51  CALCIUM 8.6*   GFR: Estimated Creatinine Clearance: 64.1 mL/min (by C-G formula based on SCr of 0.51 mg/dL). Liver Function Tests:  Recent Labs Lab  12/16/16 2044  AST 52*  ALT 51  ALKPHOS 71  BILITOT 1.8*  PROT 7.9  ALBUMIN 2.3*   No results for input(s): LIPASE, AMYLASE in the last 168 hours. No results for input(s): AMMONIA in the last 168 hours. Coagulation Profile: No results for input(s): INR, PROTIME in the last 168 hours. Cardiac Enzymes: No results for input(s): CKTOTAL, CKMB, CKMBINDEX, TROPONINI in the last 168 hours. BNP (last 3 results) No results for input(s): PROBNP in the last 8760 hours. HbA1C: No results for input(s): HGBA1C in the last 72 hours. CBG: No results for input(s): GLUCAP in the last 168 hours. Lipid Profile: No results for input(s): CHOL, HDL, LDLCALC, TRIG, CHOLHDL, LDLDIRECT in the last 72 hours. Thyroid Function Tests: No results for input(s): TSH, T4TOTAL, FREET4, T3FREE, THYROIDAB in the last 72 hours. Anemia Panel: No results for input(s): VITAMINB12, FOLATE, FERRITIN, TIBC, IRON, RETICCTPCT in the last 72 hours. Urine analysis:    Component Value Date/Time   COLORURINE YELLOW 07/08/2013 1703   APPEARANCEUR CLOUDY (A) 07/08/2013 1703   LABSPEC 1.014 07/08/2013 1703   PHURINE 6.5 07/08/2013 1703   GLUCOSEU NEGATIVE 07/08/2013 1703   HGBUR NEGATIVE 07/08/2013 1703   BILIRUBINUR NEGATIVE 07/08/2013 1703   KETONESUR NEGATIVE 07/08/2013 1703   PROTEINUR NEGATIVE 07/08/2013 1703   UROBILINOGEN 0.2 07/08/2013 1703   NITRITE NEGATIVE 07/08/2013 1703   LEUKOCYTESUR NEGATIVE 07/08/2013 1703    Radiological Exams on Admission: Dg Hand Complete Left  Result Date: 12/17/2016 CLINICAL DATA:  Left hand swelling for 3 days.  No known injury. EXAM: LEFT HAND - COMPLETE 3+ VIEW COMPARISON:  None. FINDINGS: Diffuse bone demineralization. Degenerative changes in the interphalangeal joints. Old displaced fracture fragment off of the ulnar styloid process. No acute fracture or dislocation is identified. Soft tissue swelling over the dorsum of the left hand. Soft tissue swelling at the phalanges.  Suggestion of soft tissue gas in the proximal left second finger. This could indicate infection with gas-forming organism. No radiopaque soft tissue foreign bodies. No radiographic changes to suggest osteomyelitis. IMPRESSION: No acute bony abnormalities. Diffuse soft tissue swelling of the left hand. Suggestion of soft tissue gas in the left second finger. Electronically Signed   By: Lucienne Capers M.D.   On: 12/17/2016 04:18    EKG: Independently reviewed.  Assessment/Plan Principal Problem:   Abscess of left hand Active Problems:   Heroin abuse   HTN (hypertension)    1. Abscess of left hand 1. IV vanc 2. Toradol PRN pain 3. NPO 4. EDP calling hand surgery for I+D 2. HTN - continue home meds  DVT prophylaxis: SCDs Code Status: Full Family Communication: No family in room Disposition Plan: Home after admit Consults called: EDP calling hand surgery Admission status: Place in obs   Grace Stokes, [facility] Pager [phone]  If 7AM-7PM, please contact day team taking care of patient [email or web address] Password [email or web address]  12/17/2016, 5:09 AM

## 2016-12-17 NOTE — Progress Notes (Signed)
This note also relates to the following rows which could not be included: Pulse Rate - Cannot attach notes to unvalidated device data ECG Heart Rate - Cannot attach notes to unvalidated device data SpO2 - Cannot attach notes to unvalidated device data   

## 2016-12-17 NOTE — ED Notes (Signed)
ED Provider at bedside. 

## 2016-12-18 ENCOUNTER — Encounter (HOSPITAL_COMMUNITY): Payer: Self-pay | Admitting: Orthopedic Surgery

## 2016-12-18 DIAGNOSIS — G8929 Other chronic pain: Secondary | ICD-10-CM | POA: Diagnosis present

## 2016-12-18 DIAGNOSIS — Z87891 Personal history of nicotine dependence: Secondary | ICD-10-CM | POA: Diagnosis not present

## 2016-12-18 DIAGNOSIS — I10 Essential (primary) hypertension: Secondary | ICD-10-CM | POA: Diagnosis present

## 2016-12-18 DIAGNOSIS — D61818 Other pancytopenia: Secondary | ICD-10-CM | POA: Diagnosis not present

## 2016-12-18 DIAGNOSIS — E876 Hypokalemia: Secondary | ICD-10-CM

## 2016-12-18 DIAGNOSIS — G510 Bell's palsy: Secondary | ICD-10-CM | POA: Diagnosis present

## 2016-12-18 DIAGNOSIS — E119 Type 2 diabetes mellitus without complications: Secondary | ICD-10-CM | POA: Diagnosis not present

## 2016-12-18 DIAGNOSIS — Z7151 Drug abuse counseling and surveillance of drug abuser: Secondary | ICD-10-CM | POA: Diagnosis not present

## 2016-12-18 DIAGNOSIS — F1120 Opioid dependence, uncomplicated: Secondary | ICD-10-CM | POA: Diagnosis present

## 2016-12-18 DIAGNOSIS — Z981 Arthrodesis status: Secondary | ICD-10-CM | POA: Diagnosis not present

## 2016-12-18 DIAGNOSIS — R011 Cardiac murmur, unspecified: Secondary | ICD-10-CM | POA: Diagnosis present

## 2016-12-18 DIAGNOSIS — F1110 Opioid abuse, uncomplicated: Secondary | ICD-10-CM | POA: Diagnosis not present

## 2016-12-18 DIAGNOSIS — Z79899 Other long term (current) drug therapy: Secondary | ICD-10-CM | POA: Diagnosis not present

## 2016-12-18 DIAGNOSIS — L02519 Cutaneous abscess of unspecified hand: Secondary | ICD-10-CM | POA: Diagnosis present

## 2016-12-18 DIAGNOSIS — L02512 Cutaneous abscess of left hand: Secondary | ICD-10-CM | POA: Diagnosis not present

## 2016-12-18 DIAGNOSIS — K759 Inflammatory liver disease, unspecified: Secondary | ICD-10-CM | POA: Diagnosis present

## 2016-12-18 LAB — BASIC METABOLIC PANEL
Anion gap: 6 (ref 5–15)
BUN: 9 mg/dL (ref 6–20)
CHLORIDE: 104 mmol/L (ref 101–111)
CO2: 25 mmol/L (ref 22–32)
Calcium: 8.2 mg/dL — ABNORMAL LOW (ref 8.9–10.3)
Creatinine, Ser: 0.57 mg/dL (ref 0.44–1.00)
Glucose, Bld: 269 mg/dL — ABNORMAL HIGH (ref 65–99)
Potassium: 3.3 mmol/L — ABNORMAL LOW (ref 3.5–5.1)
SODIUM: 135 mmol/L (ref 135–145)

## 2016-12-18 LAB — CBC
HEMATOCRIT: 33.3 % — AB (ref 36.0–46.0)
HEMOGLOBIN: 10.9 g/dL — AB (ref 12.0–15.0)
MCH: 30 pg (ref 26.0–34.0)
MCHC: 32.7 g/dL (ref 30.0–36.0)
MCV: 91.7 fL (ref 78.0–100.0)
Platelets: 82 10*3/uL — ABNORMAL LOW (ref 150–400)
RBC: 3.63 MIL/uL — ABNORMAL LOW (ref 3.87–5.11)
RDW: 14.6 % (ref 11.5–15.5)
WBC: 3.4 10*3/uL — ABNORMAL LOW (ref 4.0–10.5)

## 2016-12-18 LAB — GLUCOSE, CAPILLARY
GLUCOSE-CAPILLARY: 263 mg/dL — AB (ref 65–99)
GLUCOSE-CAPILLARY: 87 mg/dL (ref 65–99)
Glucose-Capillary: 355 mg/dL — ABNORMAL HIGH (ref 65–99)
Glucose-Capillary: 244 mg/dL — ABNORMAL HIGH (ref 65–99)

## 2016-12-18 LAB — MAGNESIUM: Magnesium: 1.6 mg/dL — ABNORMAL LOW (ref 1.7–2.4)

## 2016-12-18 LAB — HIV ANTIBODY (ROUTINE TESTING W REFLEX): HIV Screen 4th Generation wRfx: NONREACTIVE

## 2016-12-18 MED ORDER — INSULIN ASPART 100 UNIT/ML ~~LOC~~ SOLN: 0.0000 [IU] | Freq: Every day | SUBCUTANEOUS | Status: DC

## 2016-12-18 MED ORDER — INSULIN STARTER KIT- SYRINGES (ENGLISH)
1.0000 | Freq: Once | Status: AC
  Administered 2016-12-18: 1
  Filled 2016-12-18: qty 1

## 2016-12-18 MED ORDER — INSULIN ASPART 100 UNIT/ML ~~LOC~~ SOLN
0.0000 [IU] | Freq: Three times a day (TID) | SUBCUTANEOUS | Status: DC
Start: 2016-12-18 — End: 2016-12-20
  Administered 2016-12-18: 9 [IU] via SUBCUTANEOUS
  Administered 2016-12-19: 2 [IU] via SUBCUTANEOUS
  Administered 2016-12-19: 1 [IU] via SUBCUTANEOUS
  Administered 2016-12-19: 7 [IU] via SUBCUTANEOUS
  Administered 2016-12-20: 2 [IU] via SUBCUTANEOUS
  Administered 2016-12-20: 1 [IU] via SUBCUTANEOUS

## 2016-12-18 MED ORDER — LIVING WELL WITH DIABETES BOOK
Freq: Once | Status: AC
  Administered 2016-12-18: 10:00:00
  Filled 2016-12-18: qty 1

## 2016-12-18 MED ORDER — INSULIN GLARGINE 100 UNIT/ML ~~LOC~~ SOLN
10.0000 [IU] | Freq: Every day | SUBCUTANEOUS | Status: DC
  Filled 2016-12-18: qty 0.1

## 2016-12-18 MED ORDER — INSULIN ASPART PROT & ASPART (70-30 MIX) 100 UNIT/ML ~~LOC~~ SUSP
8.0000 [IU] | Freq: Two times a day (BID) | SUBCUTANEOUS | Status: DC
  Administered 2016-12-18 – 2016-12-19 (×4): 8 [IU] via SUBCUTANEOUS
  Filled 2016-12-18: qty 10

## 2016-12-18 MED ORDER — POTASSIUM CHLORIDE CRYS ER 20 MEQ PO TBCR
40.0000 meq | EXTENDED_RELEASE_TABLET | Freq: Once | ORAL | Status: AC
  Administered 2016-12-18: 40 meq via ORAL
  Filled 2016-12-18: qty 2

## 2016-12-18 NOTE — Progress Notes (Signed)
PROGRESS NOTE   Grace Stokes  WUJ:[id number]    DOB: 22-Oct-1955    DOA: 12/17/2016  PCP: Claretta Fraise, MD   I have briefly reviewed patients previous medical records in [facility].  Brief Narrative:  61 year old female with PMH of tobacco abuse, IVDA, Bell's palsy, chronic pain, presented to ED with abscess of left hand after she sustained injury while working on her truck 3 days PTA. She reports doing IV heroin on right forearm within the last month but denies doing it in the involved left upper extremity. Status post I&D by orthopedics on 9/6.   Assessment & Plan:   Principal Problem:   Abscess of left hand Active Problems:   Heroin abuse   HTN (hypertension)   1. Left hand abscess: Status post I&D by hand surgery on 9/6. Continue IV vancomycin and recommendations per hand surgery. Follow blood and wound culture results-Pending. Wound care consultation appreciated. Noted persistent edema and erythema and hence continue IV antibiotics. Elevation of extremity advised. 2. Pancytopenia: Unclear etiology. Patient denies alcohol use. Review of prior labs shows hemoglobin of 12.1 in May 2018 but leukopenia and thrombocytopenia appear chronic.? Related to hepatitis (hep B surface antigen and hepatitis B core antibody IgM positive in 2017). Relatively stable. Periodically follow CBCs. 3. Hypokalemia: Continue to replace and follow. Check magnesium. 4. Essential hypertension: controlled. Continue amlodipine and metoprolol. 5. Newly diagnosed type II DM: Hemoglobin A1c 9.7. Diabetes coordinator consultation appreciated. Discussed with diabetes coordinator. Based on affordability, started insulin 70/30, 8 units subcutaneously twice a day. This may need to be titrated. Continue NovoLog SSI for now. 6. Tobacco abuse: Cessation counseled. 7. IVDA/heroin: Cessation counseled. 8. ? Hepatitis: Outpatient follow-up via PCP.   DVT prophylaxis: SCDs Code Status: Full Family Communication: None  at bedside Disposition: DC home when medically improved, possibly in the next 2-3 days.   Consultants:  Orthopedic/hand surgery   Procedures:  I&D of left hand abscess on 9/6  Antimicrobials:  IV vancomycin    Subjective: Moderate intermittent pain of left upper extremity. Able to move fingers well. Reports chronic urinary incontinence >1 year. Remote history of right-sided Bell's palsy with chronic weakness.  ROS: No chest pain, dizziness, fever, chills or dyspnea.  Objective:  Vitals:   12/17/16 2040 12/18/16 0047 12/18/16 0543 12/18/16 1300  BP: 128/67 133/71 122/62 128/67  Pulse: 65 73 61 79  Resp: 20 20 20 20   Temp: 98.5 F (36.9 C) 98.7 F (37.1 C) 98.6 F (37 C) 98.7 F (37.1 C)  TempSrc: Oral Oral Oral Oral  SpO2: 95% 93% 98% 94%  Weight:      Height:        Examination:  General exam: Pleasant middle-aged female, small built and thinly nourished, lying comfortably propped up in bed.Stable Respiratory system: Clear to auscultation. Respiratory effort normal. Stable Cardiovascular system: S1 & S2 heard, RRR. No JVD, murmurs, rubs, gallops or clicks. No pedal edema. Stable Gastrointestinal system: Abdomen is nondistended, soft and nontender. No organomegaly or masses felt. Normal bowel sounds heard. Central nervous system: Alert and oriented. No focal neurological deficits. Extremities: Symmetric 5 x 5 power. Left upper extremity postop dressing clean and dry. Fingers distal to dressing with moderate swelling but no erythema and able to move fingers well. Good capillary refill. No concern for compartment syndrome at this time. Skin: No rashes, lesions or ulcers Psychiatry: Judgement and insight appear normal. Mood & affect appropriate.     Data Reviewed: I have personally reviewed following labs  and imaging studies  CBC:  Recent Labs Lab 12/16/16 2044 12/18/16 0349  WBC 3.9* 3.4*  NEUTROABS 2.7  --   HGB 11.9* 10.9*  HCT 34.4* 33.3*  MCV 90.1 91.7    PLT 91* 82*   Basic Metabolic Panel:  Recent Labs Lab 12/16/16 2044 12/18/16 0349  NA 134* 135  K 3.3* 3.3*  CL 100* 104  CO2 26 25  GLUCOSE 248* 269*  BUN 6 9  CREATININE 0.51 0.57  CALCIUM 8.6* 8.2*   Liver Function Tests:  Recent Labs Lab 12/16/16 2044  AST 52*  ALT 51  ALKPHOS 71  BILITOT 1.8*  PROT 7.9  ALBUMIN 2.3*   Coagulation Profile: No results for input(s): INR, PROTIME in the last 168 hours. Cardiac Enzymes: No results for input(s): CKTOTAL, CKMB, CKMBINDEX, TROPONINI in the last 168 hours. HbA1C:  Recent Labs  12/17/16 1638  HGBA1C 9.7*   CBG:  Recent Labs Lab 12/17/16 1658 12/17/16 2236 12/18/16 0647 12/18/16 1204  GLUCAP 276* 249* 263* 355*    Recent Results (from the past 240 hour(s))  Aerobic/Anaerobic Culture (surgical/deep wound)     Status: None (Preliminary result)   Collection Time: 12/17/16  6:46 AM  Result Value Ref Range Status   Specimen Description ABSCESS LEFT HAND  Final   Special Requests NONE  Final   Gram Stain   Final    ABUNDANT WBC PRESENT,BOTH PMN AND MONONUCLEAR ABUNDANT GRAM POSITIVE COCCI IN PAIRS IN CHAINS    Culture CULTURE REINCUBATED FOR BETTER GROWTH  Final   Report Status PENDING  Incomplete         Radiology Studies: Dg Hand Complete Left  Result Date: 12/17/2016 CLINICAL DATA:  Left hand swelling for 3 days.  No known injury. EXAM: LEFT HAND - COMPLETE 3+ VIEW COMPARISON:  None. FINDINGS: Diffuse bone demineralization. Degenerative changes in the interphalangeal joints. Old displaced fracture fragment off of the ulnar styloid process. No acute fracture or dislocation is identified. Soft tissue swelling over the dorsum of the left hand. Soft tissue swelling at the phalanges. Suggestion of soft tissue gas in the proximal left second finger. This could indicate infection with gas-forming organism. No radiopaque soft tissue foreign bodies. No radiographic changes to suggest osteomyelitis. IMPRESSION:  No acute bony abnormalities. Diffuse soft tissue swelling of the left hand. Suggestion of soft tissue gas in the left second finger. Electronically Signed   By: Lucienne Capers M.D.   On: 12/17/2016 04:18        Scheduled Meds: . amLODipine  5 mg Oral Daily  . insulin aspart  0-5 Units Subcutaneous QHS  . insulin aspart  0-9 Units Subcutaneous TID WC  . insulin aspart protamine- aspart  8 Units Subcutaneous BID WC  . metoprolol succinate  50 mg Oral Daily   Continuous Infusions: . vancomycin Stopped (12/18/16 0602)     LOS: 0 days     Dantonio Justen, MD, FACP, FHM. [facility] Pager [phone]  If 7PM-7AM, please contact night-coverage [email or web address] Password [email or web address] 12/18/2016, 3:19 PM

## 2016-12-18 NOTE — Progress Notes (Signed)
Nutrition Brief Note  RD consulted for nutrition education regarding diabetes.   RD attempted to provide diabetes education. Patient very lethargic upon visit and was unable to answer questions. Spoke with RN, who reports pt may discharge over the weekend. RD provided written material regarding eating for diabetes. Recommend pt to see outpatient dietitian for further education.   Mariana Single RD, LDN Clinical Nutrition Pager # [phone]

## 2016-12-18 NOTE — Progress Notes (Signed)
Pt's CBG 87. MD stated for RN to discontinue pt's scheduled bedtime novolog. MD stated to give pt her scheduled 70/30 insulin if she eats more than 50% of her meal.

## 2016-12-18 NOTE — Progress Notes (Addendum)
Inpatient Diabetes Program Recommendations  AACE/ADA: New Consensus Statement on Inpatient Glycemic Control (2015)  Target Ranges:  Prepandial:   less than 140 mg/dL      Peak postprandial:   less than 180 mg/dL (1-2 hours)      Critically ill patients:  140 - 180 mg/dL   Lab Results  Component Value Date   GLUCAP 263 (H) 12/18/2016   HGBA1C 9.7 (H) 12/17/2016    Review of Glycemic Control Results for Grace Stokes, Grace Stokes (MRN [id number]) as of 12/18/2016 09:01  Ref. Range 12/17/2016 16:58 12/17/2016 22:36 12/18/2016 06:47  Glucose-Capillary Latest Ref Range: 65 - 99 mg/dL 276 (H) 249 (H) 263 (H)   Diabetes history: No prior hx  Inpatient Diabetes Program Recommendations:  Noted patient new onset DM. Due to patient on Medicare, patient may want to do less expensive insulin Novolin 70/30 insulin from [facility]. -Change insulin on discharge to 70/30 Novolin 8 units bid (would provide 11 basal + 5 units MC) Will speak to patient today. 9:30 Spoke with RN Clarise Cruz by phone and Lantus has not been given this am. Spoke with Dr. Algis Liming and placed new orders. Discontinued Lantus and placed order for 70/30 8 units Novolog Mix to start now then bid ac meals.  1:50 pm: Spoke with patient and husband @ bedside about new diagnosis. Discussed A1C 9.7 (232 average blood sugar over the past 2-3 months time) results with them and explained what an A1C is, basic pathophysiology of DM Type 2, basic home care, basic diabetes diet nutrition principles, importance of checking CBGs and maintaining good CBG control to prevent long-term and short-term complications. Reviewed signs and symptoms of hyperglycemia and hypoglycemia and how to treat hypoglycemia at home. Also reviewed blood sugar goals at home.  RNs to provide ongoing basic DM education at bedside with this patient. Have ordered educational booklet, insulin starter kit, and DM videos. Have also placed RD consult for DM diet education for this patient.    Thank  you, Nani Gasser. Maybelline Kolarik, RN, MSN, CDE  Diabetes Coordinator Inpatient Glycemic Control Team Team Pager [phone] (8am-5pm) 12/18/2016 9:11 AM

## 2016-12-18 NOTE — Consult Note (Addendum)
WOC requested to change first post-op dressing.   Pt is followed by Dr Caralyn Guile of the hand surgery team.   Removed acewrap and splint, revealing full thickness post-op wound to left hand. Removed previous packing strip. .3X.3X.8cm with undermining to wound edges approx .5cm. Sutures surrounding the wound edges and generalized erythremia and edema to left hand, approx 5X5cm.  Applied packing strip using swab to fill, then vaseline gauze, kerlex and arm splint, and ace wrap. Orders provided for bedside nurses to perform Q day.  Pt tolerated with minimal discomfort after pain meds given earlier. Pt could benefit from home health for dressing changes after discharge; please order if desired. Please re-consult if further assistance is needed.  Thank-you,  Julien Girt MSN, [place], [place], [place], [place]

## 2016-12-19 LAB — CBC
HEMATOCRIT: 33.3 % — AB (ref 36.0–46.0)
Hemoglobin: 10.9 g/dL — ABNORMAL LOW (ref 12.0–15.0)
MCH: 30.1 pg (ref 26.0–34.0)
MCHC: 32.7 g/dL (ref 30.0–36.0)
MCV: 92 fL (ref 78.0–100.0)
Platelets: 78 10*3/uL — ABNORMAL LOW (ref 150–400)
RBC: 3.62 MIL/uL — ABNORMAL LOW (ref 3.87–5.11)
RDW: 14.6 % (ref 11.5–15.5)
WBC: 2.9 10*3/uL — ABNORMAL LOW (ref 4.0–10.5)

## 2016-12-19 LAB — GLUCOSE, CAPILLARY
GLUCOSE-CAPILLARY: 181 mg/dL — AB (ref 65–99)
GLUCOSE-CAPILLARY: 309 mg/dL — AB (ref 65–99)
GLUCOSE-CAPILLARY: 158 mg/dL — AB (ref 65–99)
Glucose-Capillary: 178 mg/dL — ABNORMAL HIGH (ref 65–99)

## 2016-12-19 LAB — BASIC METABOLIC PANEL
Anion gap: 5 (ref 5–15)
BUN: 8 mg/dL (ref 6–20)
CALCIUM: 8.2 mg/dL — AB (ref 8.9–10.3)
CO2: 23 mmol/L (ref 22–32)
Chloride: 107 mmol/L (ref 101–111)
Creatinine, Ser: 0.56 mg/dL (ref 0.44–1.00)
GFR calc Af Amer: >60 mL/min (ref >60)
GFR calc non Af Amer: >60 mL/min (ref >60)
Glucose, Bld: 166 mg/dL — ABNORMAL HIGH (ref 65–99)
Potassium: 3.5 mmol/L (ref 3.5–5.1)
Sodium: 135 mmol/L (ref 135–145)

## 2016-12-19 MED ORDER — MAGNESIUM SULFATE 2 GM/50ML IV SOLN
2.0000 g | Freq: Once | INTRAVENOUS | Status: AC
  Administered 2016-12-19: 2 g via INTRAVENOUS
  Filled 2016-12-19: qty 50

## 2016-12-19 MED ORDER — POTASSIUM CHLORIDE CRYS ER 20 MEQ PO TBCR
40.0000 meq | EXTENDED_RELEASE_TABLET | Freq: Once | ORAL | Status: AC
  Administered 2016-12-19: 40 meq via ORAL
  Filled 2016-12-19: qty 2

## 2016-12-19 NOTE — Progress Notes (Signed)
PROGRESS NOTE   Grace Stokes  JJK:[id number]    DOB: [birth year]    DOA: 12/17/2016  PCP: Claretta Fraise, MD   I have briefly reviewed patients previous medical records in [facility].  Brief Narrative:  61 year old female with PMH of tobacco abuse, IVDA, Bell's palsy, chronic pain, presented to ED with abscess of left hand after she sustained injury while working on her truck 3 days PTA. She reports doing IV heroin on right forearm within the last month but denies doing it in the involved left upper extremity. Status post I&D by orthopedics on 9/6.   Assessment & Plan:   Principal Problem:   Abscess of left hand Active Problems:   Heroin abuse   HTN (hypertension)   1. Left hand abscess: Status post I&D by hand surgery on 9/6. Continue IV vancomycin and recommendations per hand surgery. Wound culture, preliminary shows strep agalactiae. Blood cultures 2: Negative to date. Edema and erythema starting to improve. Continue IV antibiotics for additional 24 hours and reassess in a.m. Elevation of extremity advised. Discussed with Dr. Apolonio Schneiders, Hand Surgery who recommended outpatient follow-up in his office on Monday or Tuesday. 2. Pancytopenia: Unclear etiology. Patient denies alcohol use. Review of prior labs shows hemoglobin of 12.1 in May 2018 but leukopenia and thrombocytopenia appear chronic.? Related to hepatitis (hep B surface antigen and hepatitis B core antibody IgM positive in 2017). Relatively stable. Periodically follow CBCs. 3. Hypokalemia: Continue to replace and follow. Replace magnesium. 4. Hypomagnesemia: Replaced. Follow labs. 5. Essential hypertension: controlled. Continue amlodipine and metoprolol. 6. Newly diagnosed type II DM: Hemoglobin A1c 9.7. Diabetes coordinator consultation appreciated. Discussed with diabetes coordinator. Based on affordability, started insulin 70/30, 8 units subcutaneously twice a day. This may need to be titrated. Continue NovoLog SSI for now.  Better controlled. 7. Tobacco abuse: Cessation counseled. 8. IVDA/heroin: Cessation counseled. 9. ? Hepatitis: Outpatient follow-up via PCP.   DVT prophylaxis: SCDs Code Status: Full Family Communication: None at bedside Disposition: DC home when medically improved, possibly in the next 1-2 days.   Consultants:  Orthopedic/hand surgery   Procedures:  I&D of left hand abscess on 9/6  Antimicrobials:  IV vancomycin    Subjective: Reports that left upper extremity pain, swelling and redness have improved. Pain reported as mild, moderate and intermittent.  ROS: No chest pain, dizziness, fever, chills or dyspnea.  Objective:  Vitals:   12/18/16 0543 12/18/16 1300 12/18/16 2125 12/19/16 0444  BP: 122/62 128/67 (!) 125/52 (!) 129/55  Pulse: 61 79 100 63  Resp: 20 20    Temp: 98.6 F (37 C) 98.7 F (37.1 C) 98.3 F (36.8 C) 98.3 F (36.8 C)  TempSrc: Oral Oral Oral Oral  SpO2: 98% 94% 98% 100%  Weight:      Height:        Examination:  General exam: Pleasant middle-aged female, small built and thinly nourished, lying comfortably propped up in bed.Stable Respiratory system: Clear to auscultation. Respiratory effort normal. Stable Cardiovascular system: S1 & S2 heard, RRR. No JVD, murmurs, rubs, gallops or clicks. No pedal edema. Stable Gastrointestinal system: Abdomen is nondistended, soft and nontender. No organomegaly or masses felt. Normal bowel sounds heard. Central nervous system: Alert and oriented. No focal neurological deficits. Extremities: Symmetric 5 x 5 power. Left upper extremity postop dressing clean and dry. Fingers distal to dressing with Improving swelling, faint redness but no tenderness. Good capillary refill. Good finger movements. No concern for compartment syndrome at this time. Skin: No rashes,  lesions or ulcers Psychiatry: Judgement and insight appear normal. Mood & affect appropriate.     Data Reviewed: I have personally reviewed following  labs and imaging studies  CBC:  Recent Labs Lab 12/16/16 2044 12/18/16 0349 12/19/16 0310  WBC 3.9* 3.4* 2.9*  NEUTROABS 2.7  --   --   HGB 11.9* 10.9* 10.9*  HCT 34.4* 33.3* 33.3*  MCV 90.1 91.7 92.0  PLT 91* 82* 78*   Basic Metabolic Panel:  Recent Labs Lab 12/16/16 2044 12/18/16 0349 12/19/16 0310  NA 134* 135 135  K 3.3* 3.3* 3.5  CL 100* 104 107  CO2 26 25 23   GLUCOSE 248* 269* 166*  BUN 6 9 8   CREATININE 0.51 0.57 0.56  CALCIUM 8.6* 8.2* 8.2*  MG  --  1.6*  --    Liver Function Tests:  Recent Labs Lab 12/16/16 2044  AST 52*  ALT 51  ALKPHOS 71  BILITOT 1.8*  PROT 7.9  ALBUMIN 2.3*   Coagulation Profile: No results for input(s): INR, PROTIME in the last 168 hours. Cardiac Enzymes: No results for input(s): CKTOTAL, CKMB, CKMBINDEX, TROPONINI in the last 168 hours. HbA1C:  Recent Labs  12/17/16 1638  HGBA1C 9.7*   CBG:  Recent Labs Lab 12/18/16 1204 12/18/16 1701 12/18/16 2128 12/19/16 0609 12/19/16 1141  GLUCAP 355* 87 244* 181* 178*    Recent Results (from the past 240 hour(s))  Blood culture (routine x 2)     Status: None (Preliminary result)   Collection Time: 12/17/16  3:38 AM  Result Value Ref Range Status   Specimen Description BLOOD RIGHT ARM  Final   Special Requests   Final    BOTTLES DRAWN AEROBIC AND ANAEROBIC Blood Culture results may not be optimal due to an excessive volume of blood received in culture bottles   Culture NO GROWTH 2 DAYS  Final   Report Status PENDING  Incomplete  Blood culture (routine x 2)     Status: None (Preliminary result)   Collection Time: 12/17/16  4:00 AM  Result Value Ref Range Status   Specimen Description BLOOD RIGHT HAND  Final   Special Requests IN PEDIATRIC BOTTLE Blood Culture adequate volume  Final   Culture NO GROWTH 2 DAYS  Final   Report Status PENDING  Incomplete  Aerobic/Anaerobic Culture (surgical/deep wound)     Status: None (Preliminary result)   Collection Time: 12/17/16   6:46 AM  Result Value Ref Range Status   Specimen Description ABSCESS LEFT HAND  Final   Special Requests NONE  Final   Gram Stain   Final    ABUNDANT WBC PRESENT,BOTH PMN AND MONONUCLEAR ABUNDANT GRAM POSITIVE COCCI IN PAIRS IN CHAINS    Culture   Final    ABUNDANT GROUP B STREP(S.AGALACTIAE)ISOLATED TESTING AGAINST S. AGALACTIAE NOT ROUTINELY PERFORMED DUE TO PREDICTABILITY OF AMP/PEN/VAN SUSCEPTIBILITY. CULTURE REINCUBATED FOR BETTER GROWTH NO ANAEROBES ISOLATED; CULTURE IN PROGRESS FOR 5 DAYS    Report Status PENDING  Incomplete         Radiology Studies: No results found.      Scheduled Meds: . amLODipine  5 mg Oral Daily  . insulin aspart  0-9 Units Subcutaneous TID WC  . insulin aspart protamine- aspart  8 Units Subcutaneous BID WC  . metoprolol succinate  50 mg Oral Daily   Continuous Infusions: . vancomycin Stopped (12/19/16 0604)     LOS: 1 day     HONGALGI,ANAND, MD, FACP, FHM. [facility] Pager [phone]  If  7PM-7AM, please contact night-coverage [email or web address] Password [email or web address] 12/19/2016, 2:07 PM

## 2016-12-20 DIAGNOSIS — F1110 Opioid abuse, uncomplicated: Secondary | ICD-10-CM

## 2016-12-20 DIAGNOSIS — I10 Essential (primary) hypertension: Secondary | ICD-10-CM

## 2016-12-20 LAB — BASIC METABOLIC PANEL
ANION GAP: 4 — AB (ref 5–15)
BUN: 8 mg/dL (ref 6–20)
CALCIUM: 7.8 mg/dL — AB (ref 8.9–10.3)
CHLORIDE: 105 mmol/L (ref 101–111)
CO2: 23 mmol/L (ref 22–32)
CREATININE: 0.59 mg/dL (ref 0.44–1.00)
GFR calc non Af Amer: >60 mL/min (ref >60)
Glucose, Bld: 234 mg/dL — ABNORMAL HIGH (ref 65–99)
Potassium: 3.9 mmol/L (ref 3.5–5.1)
SODIUM: 132 mmol/L — AB (ref 135–145)

## 2016-12-20 LAB — GLUCOSE, CAPILLARY
GLUCOSE-CAPILLARY: 173 mg/dL — AB (ref 65–99)
GLUCOSE-CAPILLARY: 126 mg/dL — AB (ref 65–99)

## 2016-12-20 LAB — CBC
HEMATOCRIT: 34.8 % — AB (ref 36.0–46.0)
HEMOGLOBIN: 11.8 g/dL — AB (ref 12.0–15.0)
MCH: 31.1 pg (ref 26.0–34.0)
MCHC: 33.9 g/dL (ref 30.0–36.0)
MCV: 91.6 fL (ref 78.0–100.0)
Platelets: 87 10*3/uL — ABNORMAL LOW (ref 150–400)
RBC: 3.8 MIL/uL — ABNORMAL LOW (ref 3.87–5.11)
RDW: 14.5 % (ref 11.5–15.5)
WBC: 3.2 10*3/uL — AB (ref 4.0–10.5)

## 2016-12-20 LAB — MAGNESIUM: MAGNESIUM: 1.9 mg/dL (ref 1.7–2.4)

## 2016-12-20 MED ORDER — AMOXICILLIN-POT CLAVULANATE 875-125 MG PO TABS: 1.0000 | ORAL_TABLET | Freq: Two times a day (BID) | ORAL | 0 refills | Status: DC

## 2016-12-20 MED ORDER — IBUPROFEN 200 MG PO TABS: 600.0000 mg | ORAL_TABLET | Freq: Four times a day (QID) | ORAL | Status: AC | PRN

## 2016-12-20 MED ORDER — "INSULIN SYRINGE-NEEDLE U-100 29G X 1/2"" 0.3 ML MISC": 0 refills | Status: DC

## 2016-12-20 MED ORDER — AMOXICILLIN-POT CLAVULANATE 875-125 MG PO TABS
1.0000 | ORAL_TABLET | Freq: Two times a day (BID) | ORAL | Status: DC
  Administered 2016-12-20: 1 via ORAL
  Filled 2016-12-20 (×2): qty 1

## 2016-12-20 MED ORDER — INSULIN ASPART PROT & ASPART (70-30 MIX) 100 UNIT/ML ~~LOC~~ SUSP: 12.0000 [IU] | Freq: Two times a day (BID) | SUBCUTANEOUS | 0 refills | Status: DC

## 2016-12-20 MED ORDER — BLOOD GLUCOSE MONITOR KIT: PACK | 0 refills | Status: DC

## 2016-12-20 MED ORDER — INSULIN ASPART PROT & ASPART (70-30 MIX) 100 UNIT/ML ~~LOC~~ SUSP
12.0000 [IU] | Freq: Two times a day (BID) | SUBCUTANEOUS | Status: DC
  Administered 2016-12-20: 12 [IU] via SUBCUTANEOUS

## 2016-12-20 NOTE — Discharge Summary (Signed)
Physician Discharge Summary  Grace Stokes YQI:[id number] DOB: 1955/12/13  PCP: Claretta Fraise, MD  Admit date: 12/17/2016 Discharge date: 12/20/2016  Recommendations for Outpatient Follow-up:  1. Dr. Claretta Fraise, PCP in 4 days with repeat labs (CBC & BMP).  2. Dr. Iran Planas, Hand Surgeon on 12/21/16 or 12/22/16. Postop follow-up. Please follow blood culture and final wound culture results that were sent during surgery and adjust antibiotics as needed.   Home Health: RN Equipment/Devices: None    Discharge Condition: Improved and stable  CODE STATUS: Full  Diet recommendation: Heart healthy and diabetic diet.  Discharge Diagnoses:  Principal Problem:   Abscess of left hand Active Problems:   Heroin abuse   HTN (hypertension)   Brief Summary: 61 year old female with PMH of tobacco abuse, IVDA, Bell's palsy, chronic pain, presented to ED with abscess of left hand after she sustained injury while working on her truck 3 days PTA. She reports doing IV heroin on right forearm within the last month but denies doing it in the involved left upper extremity. Status post I&D by orthopedics on 9/6.   Assessment & Plan:   1. Left hand abscess: Status post I&D by hand surgery on 9/6. She was treated in the hospital with empiric IV vancomycin. Preliminary wound culture shows abundant Streptococcus agalactiae, moderate Klebsiella pneumoniae, no anaerobes and sensitivities pending. Blood cultures 2: Negative to date. Postop dressing change by WOC. Clinically much improved. As discussed with Dr. Caralyn Guile on 9/8, patient is advised to follow-up with him in the office either tomorrow or the day after. She has been educated regarding dressing change at home. 2. Pancytopenia: Unclear etiology. Patient denies alcohol use. Review of prior labs shows hemoglobin of 12.1 in May 2018 but leukopenia and thrombocytopenia appear chronic.? Related to hepatitis (hep B surface antigen and hepatitis B core antibody  IgM positive in 2017). Relatively stable. Periodically follow CBCs. 3. Hypokalemia: Replaced 4. Hypomagnesemia: Replaced.  5. Essential hypertension: controlled. Continue amlodipine and metoprolol. 6. Newly diagnosed type II DM: Hemoglobin A1c 9.7. Diabetes coordinator consultation appreciated. Based on affordability, started insulin 70/30, and adjusted dose today to 12 units twice a day. This may need to be titrated during outpatient follow-up. Patient has been extensively counseled regarding all aspects of DM care. Improved inpatient DM control. 7. Tobacco abuse: Cessation counseled. 8. IVDA/heroin: Cessation counseled. 9. ? Hepatitis: Outpatient follow-up via PCP.   Consultants:  Orthopedic/hand surgery   Procedures:  I&D of left hand abscess on 9/6   Discharge Instructions  Discharge Instructions    Call MD for:  difficulty breathing, headache or visual disturbances    Complete by:  As directed    Call MD for:  extreme fatigue    Complete by:  As directed    Call MD for:  persistant dizziness or light-headedness    Complete by:  As directed    Call MD for:  persistant nausea and vomiting    Complete by:  As directed    Call MD for:  redness, tenderness, or signs of infection (pain, swelling, redness, odor or green/yellow discharge around incision site)    Complete by:  As directed    Call MD for:  severe uncontrolled pain    Complete by:  As directed    Call MD for:  temperature >100.4    Complete by:  As directed    Diet - low sodium heart healthy    Complete by:  As directed    Diet Carb Modified  Complete by:  As directed    Discharge wound care:    Complete by:  As directed    Keep current dressing on left hand until your outpatient follow-up visit with the hand surgeon tomorrow or the day after.   Increase activity slowly    Complete by:  As directed        Medication List    TAKE these medications   amLODipine 5 MG tablet Commonly known as:   NORVASC Take 1 tablet (5 mg total) by mouth daily. For blood pressure   amoxicillin-clavulanate 875-125 MG tablet Commonly known as:  AUGMENTIN Take 1 tablet by mouth 2 (two) times daily.   blood glucose meter kit and supplies Kit Dispense based on patient and insurance preference. Use up to four times daily as directed. (FOR ICD-9 250.00, 250.01).   cyclobenzaprine 10 MG tablet Commonly known as:  FLEXERIL Take 1 tablet (10 mg total) by mouth 2 (two) times daily as needed for muscle spasms.   ibuprofen 200 MG tablet Commonly known as:  MOTRIN IB Take 3 tablets (600 mg total) by mouth every 6 (six) hours as needed for fever, mild pain or moderate pain.   insulin aspart protamine- aspart (70-30) 100 UNIT/ML injection Commonly known as:  NOVOLOG MIX 70/30 Inject 0.12 mLs (12 Units total) into the skin 2 (two) times daily with a meal.   Insulin Syringe-Needle U-100 29G X 1/2" 0.3 ML Misc Commonly known as:  SAFETY-GLIDE 0.3CC SYR 29GX1/2 Use as directed, 2 times daily with meals.   metoprolol succinate 50 MG 24 hr tablet Commonly known as:  TOPROL-XL Take 1 tablet (50 mg total) by mouth daily. For heart and blood pressure      Follow-up Information    Iran Planas, MD Follow up on 12/21/2016.   Specialty:  Orthopedic Surgery Why:  Please go to his office either on 9/10 on 9/11 for follow-up. Please call his office prior to the visit. Contact information: [address] [place] [address] [phone]        Claretta Fraise, MD. Schedule an appointment as soon as possible for a visit in 4 day(s).   Specialty:  Family Medicine Why:  To be seen with repeat labs (CBC & BMP). Contact information: [place] [address] [phone]          No Known Allergies    Procedures/Studies: Dg Hand Complete Left  Result Date: 12/17/2016 CLINICAL DATA:  Left hand swelling for 3 days.  No known injury. EXAM: LEFT HAND - COMPLETE 3+ VIEW  COMPARISON:  None. FINDINGS: Diffuse bone demineralization. Degenerative changes in the interphalangeal joints. Old displaced fracture fragment off of the ulnar styloid process. No acute fracture or dislocation is identified. Soft tissue swelling over the dorsum of the left hand. Soft tissue swelling at the phalanges. Suggestion of soft tissue gas in the proximal left second finger. This could indicate infection with gas-forming organism. No radiopaque soft tissue foreign bodies. No radiographic changes to suggest osteomyelitis. IMPRESSION: No acute bony abnormalities. Diffuse soft tissue swelling of the left hand. Suggestion of soft tissue gas in the left second finger. Electronically Signed   By: Lucienne Capers M.D.   On: 12/17/2016 04:18   Mm Digital Screening  Result Date: 12/03/2016 CLINICAL DATA:  Screening. EXAM: DIGITAL SCREENING BILATERAL MAMMOGRAM WITH CAD COMPARISON:  Previous exam(s). ACR Breast Density Category a: The breast tissue is almost entirely fatty. FINDINGS: There are no findings suspicious for malignancy. Images  were processed with CAD. IMPRESSION: No mammographic evidence of malignancy. A result letter of this screening mammogram will be mailed directly to the patient. RECOMMENDATION: Screening mammogram in one year. (Code:SM-B-01Y) BI-RADS CATEGORY  1: Negative. Electronically Signed   By: Margarette Canada M.D.   On: 12/03/2016 09:48      Subjective: States that she feels much better. Anxious to go home. Minimal pain reported and operated left hand. No drainage. Swelling and redness have almost resolved. I removed her dressing and examined and in the presence of her nurse.  Discharge Exam:  Vitals:   12/19/16 1541 12/19/16 1937 12/20/16 0551 12/20/16 1407  BP: (!) 138/58 (!) 142/62 (!) 115/56 (!) 115/56  Pulse: 72 71 64 65  Resp: 18   16  Temp: 98.1 F (36.7 C) 98.4 F (36.9 C) 98.2 F (36.8 C) 98.2 F (36.8 C)  TempSrc: Oral Oral Oral Oral  SpO2: 100% 100% 97% 99%   Weight:      Height:        General exam: Pleasant middle-aged female, small built and thinly nourished, sitting up comfortably in the chair this morning. Respiratory system: Clear to auscultation. Respiratory effort normal. Stable Cardiovascular system: S1 & S2 heard, RRR. No JVD, murmurs, rubs, gallops or clicks. No pedal edema.  Gastrointestinal system: Abdomen is nondistended, soft and nontender. No organomegaly or masses felt. Normal bowel sounds heard. Central nervous system: Alert and oriented. No focal neurological deficits. Extremities: Symmetric 5 x 5 power. Dorsum of left hand surgical incision site without acute findings. Minimal expected swelling and erythema, no increased warmth or drainage. Removed packing gauze. Subsequently dressed back by RN with dry gauze and splint was reapplied. Skin: No rashes, lesions or ulcers Psychiatry: Judgement and insight appear normal. Mood & affect appropriate.     The results of significant diagnostics from this hospitalization (including imaging, microbiology, ancillary and laboratory) are listed below for reference.     Microbiology: Recent Results (from the past 240 hour(s))  Blood culture (routine x 2)     Status: None (Preliminary result)   Collection Time: 12/17/16  3:38 AM  Result Value Ref Range Status   Specimen Description BLOOD RIGHT ARM  Final   Special Requests   Final    BOTTLES DRAWN AEROBIC AND ANAEROBIC Blood Culture results may not be optimal due to an excessive volume of blood received in culture bottles   Culture NO GROWTH 3 DAYS  Final   Report Status PENDING  Incomplete  Blood culture (routine x 2)     Status: None (Preliminary result)   Collection Time: 12/17/16  4:00 AM  Result Value Ref Range Status   Specimen Description BLOOD RIGHT HAND  Final   Special Requests IN PEDIATRIC BOTTLE Blood Culture adequate volume  Final   Culture NO GROWTH 3 DAYS  Final   Report Status PENDING  Incomplete  Aerobic/Anaerobic  Culture (surgical/deep wound)     Status: None (Preliminary result)   Collection Time: 12/17/16  6:46 AM  Result Value Ref Range Status   Specimen Description ABSCESS LEFT HAND  Final   Special Requests NONE  Final   Gram Stain   Final    ABUNDANT WBC PRESENT,BOTH PMN AND MONONUCLEAR ABUNDANT GRAM POSITIVE COCCI IN PAIRS IN CHAINS    Culture   Final    ABUNDANT GROUP B STREP(S.AGALACTIAE)ISOLATED TESTING AGAINST S. AGALACTIAE NOT ROUTINELY PERFORMED DUE TO PREDICTABILITY OF AMP/PEN/VAN SUSCEPTIBILITY. MODERATE KLEBSIELLA PNEUMONIAE SUSCEPTIBILITIES TO FOLLOW NO ANAEROBES ISOLATED; CULTURE IN PROGRESS  FOR 5 DAYS    Report Status PENDING  Incomplete     Labs: CBC:  Recent Labs Lab 12/16/16 2044 12/18/16 0349 12/19/16 0310 12/20/16 0308  WBC 3.9* 3.4* 2.9* 3.2*  NEUTROABS 2.7  --   --   --   HGB 11.9* 10.9* 10.9* 11.8*  HCT 34.4* 33.3* 33.3* 34.8*  MCV 90.1 91.7 92.0 91.6  PLT 91* 82* 78* 87*   Basic Metabolic Panel:  Recent Labs Lab 12/16/16 2044 12/18/16 0349 12/19/16 0310 12/20/16 0308  NA 134* 135 135 132*  K 3.3* 3.3* 3.5 3.9  CL 100* 104 107 105  CO2 _0 GLUCOSE 248* 269* 166* 234*  BUN _1 CREATININE 0.51 0.57 0.56 0.59  CALCIUM 8.6* 8.2* 8.2* 7.8*  MG  --  1.6*  --  1.9   Liver Function Tests:  Recent Labs Lab 12/16/16 2044  AST 52*  ALT 51  ALKPHOS 71  BILITOT 1.8*  PROT 7.9  ALBUMIN 2.3*    CBG:  Recent Labs Lab 12/19/16 1141 12/19/16 1651 12/19/16 2027 12/20/16 0555 12/20/16 1314  GLUCAP 178* 309* 158* 173* 126*   Hgb A1c  Recent Labs  12/17/16 1638  HGBA1C 9.7*       Time coordinating discharge: Over 30 minutes  SIGNED:  Vernell Leep, MD, FACP, FHM. [facility] Pager [phone]  If 7PM-7AM, please contact night-coverage [email or web address] Password [email or web address] 12/20/2016, 2:17 PM

## 2016-12-20 NOTE — Discharge Instructions (Signed)

## 2016-12-21 ENCOUNTER — Telehealth: Payer: Self-pay | Admitting: Family Medicine

## 2016-12-21 NOTE — Anesthesia Postprocedure Evaluation (Signed)
Anesthesia Post Note  Patient: Grace Stokes  Procedure(s) Performed: Procedure(s) (LRB): IRRIGATION AND DEBRIDEMENT LEFT HAND (Left)     Patient location during evaluation: PACU Anesthesia Type: General Level of consciousness: awake and alert Pain management: pain level controlled Vital Signs Assessment: post-procedure vital signs reviewed and stable Respiratory status: spontaneous breathing, nonlabored ventilation, respiratory function stable and patient connected to nasal cannula oxygen Cardiovascular status: blood pressure returned to baseline and stable Postop Assessment: no signs of nausea or vomiting Anesthetic complications: no    Last Vitals:  Vitals:   12/20/16 0551 12/20/16 1407  BP: (!) 115/56 (!) 115/56  Pulse: 64 65  Resp:  16  Temp: 36.8 C 36.8 C  SpO2: 97% 99%    Last Pain:  Vitals:   12/20/16 1407  TempSrc: Oral  PainSc:                  [place]

## 2016-12-22 DIAGNOSIS — L02512 Cutaneous abscess of left hand: Secondary | ICD-10-CM | POA: Diagnosis not present

## 2016-12-22 LAB — CULTURE, BLOOD (ROUTINE X 2)
CULTURE: NO GROWTH
Culture: NO GROWTH
SPECIAL REQUESTS: ADEQUATE

## 2016-12-22 LAB — AEROBIC/ANAEROBIC CULTURE W GRAM STAIN (SURGICAL/DEEP WOUND)

## 2016-12-22 LAB — AEROBIC/ANAEROBIC CULTURE (SURGICAL/DEEP WOUND)

## 2016-12-22 NOTE — Telephone Encounter (Signed)
Appt made for hospital follow up.  Discharge summaries states needs to be seen within 4 days of discharge.

## 2016-12-23 ENCOUNTER — Ambulatory Visit (INDEPENDENT_AMBULATORY_CARE_PROVIDER_SITE_OTHER): Payer: Medicare Other | Admitting: Family Medicine

## 2016-12-23 ENCOUNTER — Encounter: Payer: Self-pay | Admitting: Family Medicine

## 2016-12-23 VITALS — BP 128/82 | HR 77 | Temp 97.3°F | Ht 61.0 in | Wt 142.0 lb

## 2016-12-23 DIAGNOSIS — Z794 Long term (current) use of insulin: Secondary | ICD-10-CM | POA: Diagnosis not present

## 2016-12-23 DIAGNOSIS — L02519 Cutaneous abscess of unspecified hand: Secondary | ICD-10-CM

## 2016-12-23 DIAGNOSIS — E118 Type 2 diabetes mellitus with unspecified complications: Secondary | ICD-10-CM | POA: Diagnosis not present

## 2016-12-23 DIAGNOSIS — IMO0002 Reserved for concepts with insufficient information to code with codable children: Secondary | ICD-10-CM

## 2016-12-23 DIAGNOSIS — E1165 Type 2 diabetes mellitus with hyperglycemia: Secondary | ICD-10-CM

## 2016-12-23 DIAGNOSIS — F1110 Opioid abuse, uncomplicated: Secondary | ICD-10-CM | POA: Diagnosis not present

## 2016-12-23 DIAGNOSIS — L03119 Cellulitis of unspecified part of limb: Secondary | ICD-10-CM

## 2016-12-23 MED ORDER — INSULIN ASPART PROT & ASPART (70-30 MIX) 100 UNIT/ML PEN: PEN_INJECTOR | SUBCUTANEOUS | 11 refills | Status: DC

## 2016-12-23 NOTE — Progress Notes (Signed)
Subjective:  Patient ID: Grace Stokes, female    DOB: 1955/05/10  Age: 61 y.o. MRN: [id number]  CC: Hospitalization Follow-up (pt here today following up after going to the hospital for abscess on her left hand and had surgery to debride the abscess. Pt saw ortho yesterday and has multiple f/u appts for wound care at ortho.)   HPI [name] presents for new onset DM. Hospitalized as above started on Novolog 70/30 mix 12 units BID. Not sure how to eat or measure her glucose. Pt. Has history of heroiin addiction. Denies recent use ( > 1 month). Pain is severe. Some relief with ibuprofen. Can not afford suboxone clinic. Has no access to heroin locally. Husband will not allow.   Depression screen [facility] 2/9 12/23/2016 12/15/2016 12/01/2016  Decreased Interest 0 0 0  Down, Depressed, Hopeless 0 0 0  PHQ - 2 Score 0 0 0  Altered sleeping - - -  Tired, decreased energy - - -  Change in appetite - - -  Feeling bad or failure about yourself  - - -  Trouble concentrating - - -  Moving slowly or fidgety/restless - - -  PHQ-9 Score - - -    History [name] has a past medical history of Arthritis; Bell's palsy; Cataract; Chronic pain; Renal disorder; Scoliosis; and Toxemia in pregnancy.   She has a past surgical history that includes Back surgery; Cesarean section; Tubal ligation; Shoulder surgery; Rotator cuff repair; Carpal tunnel release; Anterior cervical decomp/discectomy fusion (N/A, 07/09/2013); Incision / drainage hand / finger (Left, 12/17/2016); and I&D extremity (Left, 12/17/2016).   Her family history is not on file.She reports that she has quit smoking. Her smoking use included Cigarettes. She has never used smokeless tobacco. She reports that she uses drugs. She reports that she does not drink alcohol.    ROS Review of Systems  Constitutional: Negative for activity change, appetite change and fever.  HENT: Negative for congestion and sore throat.   Eyes: Negative for visual  disturbance.  Respiratory: Negative for cough and shortness of breath.   Cardiovascular: Negative for chest pain and palpitations.  Gastrointestinal: Negative for abdominal pain, diarrhea and nausea.  Genitourinary: Negative for dysuria.  Musculoskeletal: Positive for back pain and myalgias. Negative for arthralgias.    Objective:  BP 128/82   Pulse 77   Temp (!) 97.3 F (36.3 C) (Oral)   Ht '5\' 1"'  (1.549 m)   Wt 142 lb (64.4 kg)   LMP 04/06/2011   BMI 26.83 kg/m   BP Readings from Last 3 Encounters:  12/23/16 128/82  12/20/16 (!) 115/56  12/15/16 (!) 165/80    Wt Readings from Last 3 Encounters:  12/23/16 142 lb (64.4 kg)  12/16/16 145 lb (65.8 kg)  12/15/16 146 lb (66.2 kg)     Physical Exam  Constitutional: She is oriented to person, place, and time. She appears well-developed and well-nourished. No distress.  HENT:  Head: Normocephalic and atraumatic.  Right Ear: External ear normal.  Left Ear: External ear normal.  Nose: Nose normal.  Mouth/Throat: Oropharynx is clear and moist.  Eyes: Pupils are equal, round, and reactive to light. Conjunctivae and EOM are normal.  Neck: Normal range of motion. Neck supple. No thyromegaly present.  Cardiovascular: Normal rate, regular rhythm and normal heart sounds.   No murmur heard. Pulmonary/Chest: Effort normal and breath sounds normal. No respiratory distress. She has no wheezes. She has no rales.  Abdominal: Soft. Bowel sounds are normal. She  exhibits no distension. There is no tenderness.  Musculoskeletal: She exhibits deformity (dorsal kyphosis).  Lymphadenopathy:    She has no cervical adenopathy.  Neurological: She is alert and oriented to person, place, and time. She has normal reflexes. No cranial nerve deficit.  Skin: Skin is warm and dry.  Psychiatric: She has a normal mood and affect. Her behavior is normal. Judgment and thought content normal.      Assessment & Plan:   Yatzil was seen today for  hospitalization follow-up.  Diagnoses and all orders for this visit:  Uncontrolled type 2 diabetes mellitus with complication, with long-term current use of insulin (HCC)  Heroin abuse -     CBC with Differential/Platelet -     CMP14+EGFR -     Acute Hep Panel & Hep B Surface Ab  Cellulitis and abscess of hand  Other orders -     insulin aspart protamine - aspart (NOVOLOG MIX 70/30 FLEXPEN) (70-30) 100 UNIT/ML FlexPen; Inject 15 units with breakfast and 15 units with supper.   Patient he was given a great deal of counseling regarding diabetic diet including carbohydrate control and identifying simple versus complex carbohydrates. Additionally we discussed twice a day glucose monitoring.    I am having Ms. Prime start on insulin aspart protamine - aspart. I am also having her maintain her amLODipine, cyclobenzaprine, metoprolol succinate, insulin aspart protamine- aspart, amoxicillin-clavulanate, ibuprofen, Insulin Syringe-Needle U-100, and blood glucose meter kit and supplies.  Allergies as of 12/23/2016   No Known Allergies     Medication List       Accurate as of 12/23/16  9:21 AM. Always use your most recent med list.          amLODipine 5 MG tablet Commonly known as:  NORVASC Take 1 tablet (5 mg total) by mouth daily. For blood pressure   amoxicillin-clavulanate 875-125 MG tablet Commonly known as:  AUGMENTIN Take 1 tablet by mouth 2 (two) times daily.   blood glucose meter kit and supplies Kit Dispense based on patient and insurance preference. Use up to four times daily as directed. (FOR ICD-9 250.00, 250.01).   cyclobenzaprine 10 MG tablet Commonly known as:  FLEXERIL Take 1 tablet (10 mg total) by mouth 2 (two) times daily as needed for muscle spasms.   ibuprofen 200 MG tablet Commonly known as:  MOTRIN IB Take 3 tablets (600 mg total) by mouth every 6 (six) hours as needed for fever, mild pain or moderate pain.   insulin aspart protamine- aspart (70-30)  100 UNIT/ML injection Commonly known as:  NOVOLOG MIX 70/30 Inject 0.12 mLs (12 Units total) into the skin 2 (two) times daily with a meal.   insulin aspart protamine - aspart (70-30) 100 UNIT/ML FlexPen Commonly known as:  NOVOLOG MIX 70/30 FLEXPEN Inject 15 units with breakfast and 15 units with supper.   Insulin Syringe-Needle U-100 29G X 1/2" 0.3 ML Misc Commonly known as:  SAFETY-GLIDE 0.3CC SYR 29GX1/2 Use as directed, 2 times daily with meals.   metoprolol succinate 50 MG 24 hr tablet Commonly known as:  TOPROL-XL Take 1 tablet (50 mg total) by mouth daily. For heart and blood pressure            Discharge Care Instructions        Start     Ordered   12/23/16 0000  CBC with Differential/Platelet     12/23/16 0901   12/23/16 0000  CMP14+EGFR     12/23/16 0901   12/23/16 0000  Acute Hep Panel & Hep B Surface Ab     12/23/16 0903   12/23/16 0000  insulin aspart protamine - aspart (NOVOLOG MIX 70/30 FLEXPEN) (70-30) 100 UNIT/ML FlexPen     12/23/16 0906       Follow-up: Return in about 7 days (around 12/30/2016).  Claretta Fraise, M.D.

## 2016-12-23 NOTE — Patient Instructions (Signed)
Carbohydrate Counting for Diabetes Mellitus, Adult Carbohydrate counting is a method for keeping track of how many carbohydrates you eat. Eating carbohydrates naturally increases the amount of sugar (glucose) in the blood. Counting how many carbohydrates you eat helps keep your blood glucose within normal limits, which helps you manage your diabetes (diabetes mellitus). It is important to know how many carbohydrates you can safely have in each meal. This is different for every person. A diet and nutrition specialist (registered dietitian) can help you make a meal plan and calculate how many carbohydrates you should have at each meal and snack. Carbohydrates are found in the following foods:  Grains, such as breads and cereals.  Dried beans and soy products.  Starchy vegetables, such as potatoes, peas, and corn.  Fruit and fruit juices.  Milk and yogurt.  Sweets and snack foods, such as cake, cookies, candy, chips, and soft drinks.  How do I count carbohydrates? There are two ways to count carbohydrates in food. You can use either of the methods or a combination of both. Reading "Nutrition Facts" on packaged food The "Nutrition Facts" list is included on the labels of almost all packaged foods and beverages in the [place] It includes:  The serving size.  Information about nutrients in each serving, including the grams (g) of carbohydrate per serving.  To use the "Nutrition Facts":  Decide how many servings you will have.  Multiply the number of servings by the number of carbohydrates per serving.  The resulting number is the total amount of carbohydrates that you will be having.  Learning standard serving sizes of other foods When you eat foods containing carbohydrates that are not packaged or do not include "Nutrition Facts" on the label, you need to measure the servings in order to count the amount of carbohydrates:  Measure the foods that you will eat with a food scale or  measuring cup, if needed.  Decide how many standard-size servings you will eat.  Multiply the number of servings by 15. Most carbohydrate-rich foods have about 15 g of carbohydrates per serving. ? For example, if you eat 8 oz (170 g) of strawberries, you will have eaten 2 servings and 30 g of carbohydrates (2 servings x 15 g = 30 g).  For foods that have more than one food mixed, such as soups and casseroles, you must count the carbohydrates in each food that is included.  The following list contains standard serving sizes of common carbohydrate-rich foods. Each of these servings has about 15 g of carbohydrates:   hamburger bun or  English muffin.   oz (15 mL) syrup.   oz (14 g) jelly.  1 slice of bread.  1 six-inch tortilla.  3 oz (85 g) cooked rice or pasta.  4 oz (113 g) cooked dried beans.  4 oz (113 g) starchy vegetable, such as peas, corn, or potatoes.  4 oz (113 g) hot cereal.  4 oz (113 g) mashed potatoes or  of a large baked potato.  4 oz (113 g) canned or frozen fruit.  4 oz (120 mL) fruit juice.  4-6 crackers.  6 chicken nuggets.  6 oz (170 g) unsweetened dry cereal.  6 oz (170 g) plain fat-free yogurt or yogurt sweetened with artificial sweeteners.  8 oz (240 mL) milk.  8 oz (170 g) fresh fruit or one small piece of fruit.  24 oz (680 g) popped popcorn.  Example of carbohydrate counting Sample meal  3 oz (85 g) chicken breast.    6 oz (170 g) brown rice.  4 oz (113 g) corn.  8 oz (240 mL) milk.  8 oz (170 g) strawberries with sugar-free whipped topping. Carbohydrate calculation 1. Identify the foods that contain carbohydrates: ? Rice. ? Corn. ? Milk. ? Strawberries. 2. Calculate how many servings you have of each food: ? 2 servings rice. ? 1 serving corn. ? 1 serving milk. ? 1 serving strawberries. 3. Multiply each number of servings by 15 g: ? 2 servings rice x 15 g = 30 g. ? 1 serving corn x 15 g = 15 g. ? 1 serving milk x 15  g = 15 g. ? 1 serving strawberries x 15 g = 15 g. 4. Add together all of the amounts to find the total grams of carbohydrates eaten: ? 30 g + 15 g + 15 g + 15 g = 75 g of carbohydrates total. This information is not intended to replace advice given to you by your health care provider. Make sure you discuss any questions you have with your health care provider. Document Released: 03/30/2005 Document Revised: 10/18/2015 Document Reviewed: 09/11/2015 Elsevier Interactive Patient Education  2018 [employer]. Hypoglycemia Hypoglycemia is when the sugar (glucose) level in the blood is too low. Symptoms of low blood sugar may include:  Feeling: ? Hungry. ? Worried or nervous (anxious). ? Sweaty and clammy. ? Confused. ? Dizzy. ? Sleepy. ? Sick to your stomach (nauseous).  Having: ? A fast heartbeat. ? A headache. ? A change in your vision. ? Jerky movements that you cannot control (seizure). ? Nightmares. ? Tingling or no feeling (numbness) around the mouth, lips, or tongue.  Having trouble with: ? Talking. ? Paying attention (concentrating). ? Moving (coordination). ? Sleeping.  Shaking.  Passing out (fainting).  Getting upset easily (irritability).  Low blood sugar can happen to people who have diabetes and people who do not have diabetes. Low blood sugar can happen quickly, and it can be an emergency. Treating Low Blood Sugar Low blood sugar is often treated by eating or drinking something sugary right away. If you can think clearly and swallow safely, follow the 15:15 rule:  Take 15 grams of a fast-acting carb (carbohydrate). Some fast-acting carbs are: ? 1 tube of glucose gel. ? 3 sugar tablets (glucose pills). ? 6-8 pieces of hard candy. ? 4 oz (120 mL) of fruit juice. ? 4 oz (120 mL) of regular (not diet) soda.  Check your blood sugar 15 minutes after you take the carb.  If your blood sugar is still at or below 70 mg/dL (3.9 mmol/L), take 15 grams of a carb  again.  If your blood sugar does not go above 70 mg/dL (3.9 mmol/L) after 3 tries, get help right away.  After your blood sugar goes back to normal, eat a meal or a snack within 1 hour.  Treating Very Low Blood Sugar If your blood sugar is at or below 54 mg/dL (3 mmol/L), you have very low blood sugar (severe hypoglycemia). This is an emergency. Do not wait to see if the symptoms will go away. Get medical help right away. Call your local emergency services (911 in the [place] Do not drive yourself to the hospital. If you have very low blood sugar and you cannot eat or drink, you may need a glucagon shot (injection). A family member or friend should learn how to check your blood sugar and how to give you a glucagon shot. Ask your doctor if you need to  have a glucagon shot kit at home. Follow these instructions at home: General instructions  Avoid any diets that cause you to not eat enough food. Talk with your doctor before you start any new diet.  Take over-the-counter and prescription medicines only as told by your doctor.  Limit alcohol to no more than 1 drink per day for nonpregnant women and 2 drinks per day for men. One drink equals 12 oz of beer, 5 oz of wine, or 1 oz of hard liquor.  Keep all follow-up visits as told by your doctor. This is important. If You Have Diabetes:   Make sure you know the symptoms of low blood sugar.  Always keep a source of sugar with you, such as: ? Sugar. ? Sugar tablets. ? Glucose gel. ? Fruit juice. ? Regular soda (not diet soda). ? Milk. ? Hard candy. ? Honey.  Take your medicines as told.  Follow your exercise and meal plan. ? Eat on time. Do not skip meals. ? Follow your sick day plan when you cannot eat or drink normally. Make this plan ahead of time with your doctor.  Check your blood sugar as often as told by your doctor. Always check before and after exercise.  Share your diabetes care plan with: ? Your work or school. ? People  you live with.  Check your pee (urine) for ketones: ? When you are sick. ? As told by your doctor.  Carry a card or wear jewelry that says you have diabetes. If You Have Low Blood Sugar From Other Causes:   Check your blood sugar as often as told by your doctor.  Follow instructions from your doctor about what you cannot eat or drink. Contact a doctor if:  You have trouble keeping your blood sugar in your target range.  You have low blood sugar often. Get help right away if:  You still have symptoms after you eat or drink something sugary.  Your blood sugar is at or below 54 mg/dL (3 mmol/L).  You have jerky movements that you cannot control.  You pass out. These symptoms may be an emergency. Do not wait to see if the symptoms will go away. Get medical help right away. Call your local emergency services (911 in the [place] Do not drive yourself to the hospital. This information is not intended to replace advice given to you by your health care provider. Make sure you discuss any questions you have with your health care provider. Document Released: 06/24/2009 Document Revised: 09/05/2015 Document Reviewed: 05/03/2015 Elsevier Interactive Patient Education  [employer].

## 2016-12-24 LAB — CBC WITH DIFFERENTIAL/PLATELET
Basophils Absolute: 0 10*3/uL (ref 0.0–0.2)
Basos: 1 %
EOS (ABSOLUTE): 0 10*3/uL (ref 0.0–0.4)
EOS: 1 %
HEMATOCRIT: 35.8 % (ref 34.0–46.6)
Hemoglobin: 12.2 g/dL (ref 11.1–15.9)
IMMATURE GRANS (ABS): 0 10*3/uL (ref 0.0–0.1)
Immature Granulocytes: 0 %
LYMPHS ABS: 1.4 10*3/uL (ref 0.7–3.1)
LYMPHS: 34 %
MCH: 31 pg (ref 26.6–33.0)
MCHC: 34.1 g/dL (ref 31.5–35.7)
MCV: 91 fL (ref 79–97)
MONOCYTES: 8 %
Monocytes Absolute: 0.3 10*3/uL (ref 0.1–0.9)
Neutrophils Absolute: 2.3 10*3/uL (ref 1.4–7.0)
Neutrophils: 56 %
PLATELETS: 85 10*3/uL — AB (ref 150–379)
RBC: 3.94 x10E6/uL (ref 3.77–5.28)
RDW: 14.6 % (ref 12.3–15.4)
WBC: 4.1 10*3/uL (ref 3.4–10.8)

## 2016-12-24 LAB — CMP14+EGFR
ALK PHOS: 159 IU/L — AB (ref 39–117)
ALT: 38 IU/L — AB (ref 0–32)
AST: 47 IU/L — AB (ref 0–40)
Albumin/Globulin Ratio: 0.6 — ABNORMAL LOW (ref 1.2–2.2)
Albumin: 2.7 g/dL — ABNORMAL LOW (ref 3.6–4.8)
BUN / CREAT RATIO: 22 (ref 12–28)
BUN: 13 mg/dL (ref 8–27)
Bilirubin Total: 0.8 mg/dL (ref 0.0–1.2)
CALCIUM: 8.5 mg/dL — AB (ref 8.7–10.3)
CHLORIDE: 101 mmol/L (ref 96–106)
CO2: 20 mmol/L (ref 20–29)
Creatinine, Ser: 0.6 mg/dL (ref 0.57–1.00)
GFR calc Af Amer: 114 mL/min/{1.73_m2} (ref >59)
GFR calc non Af Amer: 99 mL/min/{1.73_m2} (ref >59)
GLOBULIN, TOTAL: 4.9 g/dL — AB (ref 1.5–4.5)
GLUCOSE: 407 mg/dL — AB (ref 65–99)
Potassium: 4 mmol/L (ref 3.5–5.2)
SODIUM: 131 mmol/L — AB (ref 134–144)
Total Protein: 7.6 g/dL (ref 6.0–8.5)

## 2016-12-24 LAB — ACUTE HEP PANEL AND HEP B SURFACE AB
HEP A IGM: NEGATIVE
HEP B S AG: POSITIVE — AB
Hep B C IgM: NEGATIVE
Hep C Virus Ab: <0.1 s/co ratio (ref 0.0–0.9)
Hepatitis B Surf Ab Quant: <3.1 m[IU]/mL — ABNORMAL LOW (ref >9.9)

## 2016-12-25 DIAGNOSIS — L02512 Cutaneous abscess of left hand: Secondary | ICD-10-CM | POA: Diagnosis not present

## 2016-12-28 DIAGNOSIS — L02512 Cutaneous abscess of left hand: Secondary | ICD-10-CM | POA: Diagnosis not present

## 2016-12-29 ENCOUNTER — Ambulatory Visit (INDEPENDENT_AMBULATORY_CARE_PROVIDER_SITE_OTHER): Payer: Medicare Other | Admitting: Family Medicine

## 2016-12-29 ENCOUNTER — Encounter: Payer: Self-pay | Admitting: Family Medicine

## 2016-12-29 VITALS — BP 156/84 | HR 85 | Temp 97.8°F | Ht 61.0 in | Wt 148.0 lb

## 2016-12-29 DIAGNOSIS — E1165 Type 2 diabetes mellitus with hyperglycemia: Secondary | ICD-10-CM | POA: Diagnosis not present

## 2016-12-29 DIAGNOSIS — Z794 Long term (current) use of insulin: Secondary | ICD-10-CM | POA: Diagnosis not present

## 2016-12-29 DIAGNOSIS — E118 Type 2 diabetes mellitus with unspecified complications: Secondary | ICD-10-CM | POA: Diagnosis not present

## 2016-12-29 DIAGNOSIS — IMO0002 Reserved for concepts with insufficient information to code with codable children: Secondary | ICD-10-CM

## 2016-12-29 MED ORDER — LISINOPRIL 10 MG PO TABS
10.0000 mg | ORAL_TABLET | Freq: Every day | ORAL | 3 refills | Status: DC
Start: 2016-12-29 — End: 2018-03-15

## 2016-12-29 MED ORDER — INSULIN ASPART PROT & ASPART (70-30 MIX) 100 UNIT/ML PEN: PEN_INJECTOR | SUBCUTANEOUS | 11 refills | Status: DC

## 2016-12-29 NOTE — Progress Notes (Signed)
Subjective:  Patient ID: Grace Stokes, female    DOB: 25-Dec-1955  Age: 61 y.o. MRN: [id number]  CC: Follow-up (pt here today for a 1 week follow up and she brought her BS log in )   HPI Grace Stokes presents forFollow-up of diabetes. Patient checks blood sugar at home.   250-350 fasting and 300-400  postprandial Patient denies symptoms such as polyuria, polydipsia, excessive hunger, nausea No significant hypoglycemic spells noted. Medications reviewed. Pt reports taking them regularly without complication/adverse reaction being reported today.    History Grace Stokes has a past medical history of Arthritis; Bell's palsy; Cataract; Chronic pain; Renal disorder; Scoliosis; and Toxemia in pregnancy.   She has a past surgical history that includes Back surgery; Cesarean section; Tubal ligation; Shoulder surgery; Rotator cuff repair; Carpal tunnel release; Anterior cervical decomp/discectomy fusion (N/A, 07/09/2013); Incision / drainage hand / finger (Left, 12/17/2016); and I&D extremity (Left, 12/17/2016).   Her family history is not on file.She reports that she has quit smoking. Her smoking use included Cigarettes. She has never used smokeless tobacco. She reports that she uses drugs. She reports that she does not drink alcohol.  Current Outpatient Prescriptions on File Prior to Visit  Medication Sig Dispense Refill  . amLODipine (NORVASC) 5 MG tablet Take 1 tablet (5 mg total) by mouth daily. For blood pressure 30 tablet 5  . amoxicillin-clavulanate (AUGMENTIN) 875-125 MG tablet Take 1 tablet by mouth 2 (two) times daily. 14 tablet 0  . blood glucose meter kit and supplies KIT Dispense based on patient and insurance preference. Use up to four times daily as directed. (FOR ICD-9 250.00, 250.01). 1 each 0  . cyclobenzaprine (FLEXERIL) 10 MG tablet Take 1 tablet (10 mg total) by mouth 2 (two) times daily as needed for muscle spasms. 60 tablet 5  . ibuprofen (MOTRIN IB) 200 MG tablet Take 3 tablets  (600 mg total) by mouth every 6 (six) hours as needed for fever, mild pain or moderate pain.    . Insulin Syringe-Needle U-100 (SAFETY-GLIDE 0.3CC SYR 29GX1/2) 29G X 1/2" 0.3 ML MISC Use as directed, 2 times daily with meals. 100 each 0  . metoprolol succinate (TOPROL-XL) 50 MG 24 hr tablet Take 1 tablet (50 mg total) by mouth daily. For heart and blood pressure 30 tablet 5   No current facility-administered medications on file prior to visit.     ROS Review of Systems  Constitutional: Negative for activity change, appetite change and fever.  HENT: Negative for congestion, rhinorrhea and sore throat.   Eyes: Negative for visual disturbance.  Respiratory: Negative for cough and shortness of breath.   Cardiovascular: Negative for chest pain and palpitations.  Gastrointestinal: Negative for abdominal pain, diarrhea and nausea.  Genitourinary: Negative for dysuria.  Musculoskeletal: Negative for arthralgias and myalgias.    Objective:  BP (!) 156/84   Pulse 85   Temp 97.8 F (36.6 C) (Oral)   Ht '5\' 1"'  (1.549 m)   Wt 148 lb (67.1 kg)   LMP 04/06/2011   BMI 27.96 kg/m   BP Readings from Last 3 Encounters:  12/29/16 (!) 156/84  12/23/16 128/82  12/20/16 (!) 115/56    Wt Readings from Last 3 Encounters:  12/29/16 148 lb (67.1 kg)  12/23/16 142 lb (64.4 kg)  12/16/16 145 lb (65.8 kg)     Physical Exam  Constitutional: She is oriented to person, place, and time. She appears well-developed and well-nourished. No distress.  HENT:  Head: Normocephalic and atraumatic.  Eyes: Pupils are equal, round, and reactive to light. Conjunctivae are normal.  Neck: Normal range of motion. Neck supple. No thyromegaly present.  Cardiovascular: Normal rate, regular rhythm and normal heart sounds.   No murmur heard. Pulmonary/Chest: Effort normal and breath sounds normal. No respiratory distress. She has no wheezes. She has no rales.  Abdominal: Soft. Bowel sounds are normal. She exhibits no  distension. There is no tenderness.  Musculoskeletal: Normal range of motion.  Lymphadenopathy:    She has no cervical adenopathy.  Neurological: She is alert and oriented to person, place, and time.  Skin: Skin is warm and dry.  Psychiatric: She has a normal mood and affect. Her behavior is normal. Judgment and thought content normal.    No components found for: BAYER     Assessment & Plan:   Grace Stokes was seen today for follow-up.  Diagnoses and all orders for this visit:  Uncontrolled type 2 diabetes mellitus with complication, with long-term current use of insulin ([place])  Other orders -     insulin aspart protamine - aspart (NOVOLOG MIX 70/30 FLEXPEN) (70-30) 100 UNIT/ML FlexPen; Inject 20 units with breakfast and 20 units with supper. -     lisinopril (PRINIVIL,ZESTRIL) 10 MG tablet; Take 1 tablet (10 mg total) by mouth daily. For kidney protection      I have discontinued Ms. Ives's insulin aspart protamine- aspart. I have also changed her insulin aspart protamine - aspart. Additionally, I am having her start on lisinopril. Lastly, I am having her maintain her amLODipine, cyclobenzaprine, metoprolol succinate, amoxicillin-clavulanate, ibuprofen, Insulin Syringe-Needle U-100, and blood glucose meter kit and supplies.  Meds ordered this encounter  Medications  . insulin aspart protamine - aspart (NOVOLOG MIX 70/30 FLEXPEN) (70-30) 100 UNIT/ML FlexPen    Sig: Inject 20 units with breakfast and 20 units with supper.    Dispense:  15 mL    Refill:  11  . lisinopril (PRINIVIL,ZESTRIL) 10 MG tablet    Sig: Take 1 tablet (10 mg total) by mouth daily. For kidney protection    Dispense:  90 tablet    Refill:  3     Follow-up: Return in about 7 days (around 01/05/2017).  Claretta Fraise, M.D.

## 2016-12-31 DIAGNOSIS — L02512 Cutaneous abscess of left hand: Secondary | ICD-10-CM | POA: Diagnosis not present

## 2017-01-01 DIAGNOSIS — L02512 Cutaneous abscess of left hand: Secondary | ICD-10-CM | POA: Diagnosis not present

## 2017-01-06 ENCOUNTER — Ambulatory Visit: Payer: Medicare Other | Admitting: Family Medicine

## 2017-01-13 ENCOUNTER — Encounter: Payer: Self-pay | Admitting: Family Medicine

## 2017-01-13 ENCOUNTER — Ambulatory Visit (INDEPENDENT_AMBULATORY_CARE_PROVIDER_SITE_OTHER): Payer: Medicare Other | Admitting: Family Medicine

## 2017-01-13 VITALS — BP 137/86 | HR 95 | Temp 97.9°F | Ht 61.0 in | Wt 146.0 lb

## 2017-01-13 DIAGNOSIS — Z794 Long term (current) use of insulin: Secondary | ICD-10-CM | POA: Diagnosis not present

## 2017-01-13 DIAGNOSIS — IMO0002 Reserved for concepts with insufficient information to code with codable children: Secondary | ICD-10-CM

## 2017-01-13 DIAGNOSIS — E1165 Type 2 diabetes mellitus with hyperglycemia: Secondary | ICD-10-CM | POA: Insufficient documentation

## 2017-01-13 DIAGNOSIS — E118 Type 2 diabetes mellitus with unspecified complications: Secondary | ICD-10-CM

## 2017-01-13 DIAGNOSIS — Z23 Encounter for immunization: Secondary | ICD-10-CM | POA: Diagnosis not present

## 2017-01-13 DIAGNOSIS — E119 Type 2 diabetes mellitus without complications: Secondary | ICD-10-CM | POA: Insufficient documentation

## 2017-01-13 MED ORDER — INSULIN ASPART PROT & ASPART (70-30 MIX) 100 UNIT/ML PEN: PEN_INJECTOR | SUBCUTANEOUS | 11 refills | Status: DC

## 2017-01-13 NOTE — Progress Notes (Signed)
Chief Complaint  Patient presents with  . Diabetes    pt here today following up after increasing insulin and is still having high fasting blood sugars    HPI  Patient presents today for Recheck of blood glucose. She says the readings are still running in the 250-400 range. She brings in several readings. She's been taking the 7030 Novolin 20 units twice daily. No signs of low sugar. Additionally she has a history of back pain for which she has been disabled for the last 18 years. She needs a renewal of her disability paperwork.  PMH: Smoking status noted ROS: Per HPI  Objective: BP 137/86   Pulse 95   Temp 97.9 F (36.6 C) (Oral)   Ht 5\' 1"  (1.549 m)   Wt 146 lb (66.2 kg)   LMP 04/06/2011   BMI 27.59 kg/m  Gen: NAD, alert, cooperative with exam HEENT: NCAT, EOMI, PERRL CV: RRR, good S1/S2, no murmur Resp: CTABL, no wheezes, non-labored Abd: SNTND, BS present, no guarding or organomegaly Ext: No edema, warm Neuro: Alert and oriented, No gross deficits  Assessment and plan:  1. Uncontrolled type 2 diabetes mellitus with complication, with long-term current use of insulin ([place])   2. Need for immunization against influenza     Meds ordered this encounter  Medications  . insulin aspart protamine - aspart (NOVOLOG MIX 70/30 FLEXPEN) (70-30) 100 UNIT/ML FlexPen    Sig: Inject 30 units with breakfast and 30 units with supper.    Dispense:  15 mL    Refill:  11    Orders Placed This Encounter  Procedures  . Flu Vaccine QUAD 36+ mos IM    Follow up as needed.  Claretta Fraise, MD

## 2017-01-22 DIAGNOSIS — L02512 Cutaneous abscess of left hand: Secondary | ICD-10-CM | POA: Diagnosis not present

## 2017-01-26 ENCOUNTER — Ambulatory Visit (INDEPENDENT_AMBULATORY_CARE_PROVIDER_SITE_OTHER): Payer: Medicare Other | Admitting: Family Medicine

## 2017-01-26 ENCOUNTER — Encounter: Payer: Self-pay | Admitting: Family Medicine

## 2017-01-26 VITALS — BP 129/84 | HR 78 | Temp 97.8°F | Ht 61.0 in | Wt 143.0 lb

## 2017-01-26 DIAGNOSIS — Z794 Long term (current) use of insulin: Secondary | ICD-10-CM | POA: Diagnosis not present

## 2017-01-26 DIAGNOSIS — E1165 Type 2 diabetes mellitus with hyperglycemia: Secondary | ICD-10-CM

## 2017-01-26 DIAGNOSIS — B181 Chronic viral hepatitis B without delta-agent: Secondary | ICD-10-CM | POA: Insufficient documentation

## 2017-01-26 DIAGNOSIS — E118 Type 2 diabetes mellitus with unspecified complications: Secondary | ICD-10-CM

## 2017-01-26 DIAGNOSIS — IMO0002 Reserved for concepts with insufficient information to code with codable children: Secondary | ICD-10-CM

## 2017-01-26 MED ORDER — INSULIN ASPART PROT & ASPART (70-30 MIX) 100 UNIT/ML PEN: PEN_INJECTOR | SUBCUTANEOUS | 11 refills | Status: DC

## 2017-01-26 NOTE — Progress Notes (Signed)
Subjective:  Patient ID: Grace Stokes, female    DOB: 1955/12/31  Age: 61 y.o. MRN: [id number]  CC: Diabetes (pt here today for 2 week recheck on her diabetes after increasing insulin to 30 Units SubQ in the morning and with supper.)   HPI SHEILIA REZNICK presents for Recheck of her blood sugars. She is checking morning and evening and the blood sugars seem to be in the 250-300 range on most occasions but they are coming down just a bit from previous. There are a few even that are between 150 and 200 fasting. She denies any low blood sugars. She kept her glucose log since it's in a book. It was not scanned. Patient also says that since she doesn't have Medicare part D she does not want to go back to her hepatitis doctor yet. Once she gets enrolled in part D Dr. Zollie Scale wants to put her on a lifelong drug for hepatitis.  Depression screen [facility] 2/9 01/26/2017 01/13/2017 12/29/2016  Decreased Interest 0 0 0  Down, Depressed, Hopeless 0 0 0  PHQ - 2 Score 0 0 0  Altered sleeping - - -  Tired, decreased energy - - -  Change in appetite - - -  Feeling bad or failure about yourself  - - -  Trouble concentrating - - -  Moving slowly or fidgety/restless - - -  PHQ-9 Score - - -    History Paulina has a past medical history of Arthritis; Bell's palsy; Cataract; Chronic pain; Renal disorder; Scoliosis; and Toxemia in pregnancy.   She has a past surgical history that includes Back surgery; Cesarean section; Tubal ligation; Shoulder surgery; Rotator cuff repair; Carpal tunnel release; Anterior cervical decomp/discectomy fusion (N/A, 07/09/2013); Incision / drainage hand / finger (Left, 12/17/2016); and I&D extremity (Left, 12/17/2016).   Her family history is not on file.She reports that she has quit smoking. Her smoking use included Cigarettes. She has never used smokeless tobacco. She reports that she uses drugs. She reports that she does not drink alcohol.    ROS Review of Systems Noncontributory  except as per history of present illness Objective:  BP 129/84   Pulse 78   Temp 97.8 F (36.6 C) (Oral)   Ht 5' 1" (1.549 m)   Wt 143 lb (64.9 kg)   LMP 04/06/2011   BMI 27.02 kg/m   BP Readings from Last 3 Encounters:  01/26/17 129/84  01/13/17 137/86  12/29/16 (!) 156/84    Wt Readings from Last 3 Encounters:  01/26/17 143 lb (64.9 kg)  01/13/17 146 lb (66.2 kg)  12/29/16 148 lb (67.1 kg)     Physical Exam  Constitutional: She is oriented to person, place, and time. She appears well-developed and well-nourished. No distress.  Cardiovascular: Normal rate and regular rhythm.   Pulmonary/Chest: Breath sounds normal.  Neurological: She is alert and oriented to person, place, and time.  Skin: Skin is warm and dry.  Psychiatric: She has a normal mood and affect.      Assessment & Plan:   Charlise was seen today for diabetes.  Diagnoses and all orders for this visit:  Uncontrolled type 2 diabetes mellitus with complication, with long-term current use of insulin (HCC)  Chronic active viral hepatitis B ([place])  Other orders -     insulin aspart protamine - aspart (NOVOLOG MIX 70/30 FLEXPEN) (70-30) 100 UNIT/ML FlexPen; Inject 40 units with breakfast and 30 units with supper.       I have changed  Ms. Tomey's insulin aspart protamine - aspart. I am also having her maintain her amLODipine, cyclobenzaprine, metoprolol succinate, ibuprofen, Insulin Syringe-Needle U-100, blood glucose meter kit and supplies, and lisinopril.  Allergies as of 01/26/2017   No Known Allergies     Medication List       Accurate as of 01/26/17  2:25 PM. Always use your most recent med list.          amLODipine 5 MG tablet Commonly known as:  NORVASC Take 1 tablet (5 mg total) by mouth daily. For blood pressure   blood glucose meter kit and supplies Kit Dispense based on patient and insurance preference. Use up to four times daily as directed. (FOR ICD-9 250.00, 250.01).     cyclobenzaprine 10 MG tablet Commonly known as:  FLEXERIL Take 1 tablet (10 mg total) by mouth 2 (two) times daily as needed for muscle spasms.   ibuprofen 200 MG tablet Commonly known as:  MOTRIN IB Take 3 tablets (600 mg total) by mouth every 6 (six) hours as needed for fever, mild pain or moderate pain.   insulin aspart protamine - aspart (70-30) 100 UNIT/ML FlexPen Commonly known as:  NOVOLOG MIX 70/30 FLEXPEN Inject 40 units with breakfast and 30 units with supper.   Insulin Syringe-Needle U-100 29G X 1/2" 0.3 ML Misc Commonly known as:  SAFETY-GLIDE 0.3CC SYR 29GX1/2 Use as directed, 2 times daily with meals.   lisinopril 10 MG tablet Commonly known as:  PRINIVIL,ZESTRIL Take 1 tablet (10 mg total) by mouth daily. For kidney protection   metoprolol succinate 50 MG 24 hr tablet Commonly known as:  TOPROL-XL Take 1 tablet (50 mg total) by mouth daily. For heart and blood pressure        Follow-up: No Follow-up on file.   , M.D. 

## 2017-02-05 ENCOUNTER — Other Ambulatory Visit: Payer: Self-pay | Admitting: *Deleted

## 2017-02-05 MED ORDER — TRUEPLUS LANCETS 30G MISC: 1 refills | Status: DC

## 2017-02-09 ENCOUNTER — Ambulatory Visit: Payer: Medicare Other | Admitting: Family Medicine

## 2017-06-03 ENCOUNTER — Ambulatory Visit: Payer: Medicare Other | Admitting: Family Medicine

## 2017-06-09 ENCOUNTER — Ambulatory Visit (INDEPENDENT_AMBULATORY_CARE_PROVIDER_SITE_OTHER): Payer: Medicare Other | Admitting: Family Medicine

## 2017-06-09 ENCOUNTER — Encounter: Payer: Self-pay | Admitting: Family Medicine

## 2017-06-09 VITALS — BP 165/88 | HR 99 | Temp 98.1°F | Ht 61.0 in | Wt 150.0 lb

## 2017-06-09 DIAGNOSIS — IMO0002 Reserved for concepts with insufficient information to code with codable children: Secondary | ICD-10-CM

## 2017-06-09 DIAGNOSIS — Z794 Long term (current) use of insulin: Secondary | ICD-10-CM

## 2017-06-09 DIAGNOSIS — E118 Type 2 diabetes mellitus with unspecified complications: Secondary | ICD-10-CM | POA: Diagnosis not present

## 2017-06-09 DIAGNOSIS — F1110 Opioid abuse, uncomplicated: Secondary | ICD-10-CM

## 2017-06-09 DIAGNOSIS — I10 Essential (primary) hypertension: Secondary | ICD-10-CM | POA: Diagnosis not present

## 2017-06-09 DIAGNOSIS — E1165 Type 2 diabetes mellitus with hyperglycemia: Secondary | ICD-10-CM

## 2017-06-09 DIAGNOSIS — E782 Mixed hyperlipidemia: Secondary | ICD-10-CM

## 2017-06-09 LAB — BAYER DCA HB A1C WAIVED: HB A1C: 8.7 % — AB (ref <7.0)

## 2017-06-09 MED ORDER — AMLODIPINE BESYLATE 5 MG PO TABS: 5.0000 mg | ORAL_TABLET | Freq: Every day | ORAL | 5 refills | Status: DC

## 2017-06-09 MED ORDER — CYCLOBENZAPRINE HCL 10 MG PO TABS: 10.0000 mg | ORAL_TABLET | Freq: Two times a day (BID) | ORAL | 5 refills | Status: DC | PRN

## 2017-06-09 MED ORDER — METOPROLOL SUCCINATE ER 50 MG PO TB24: 50.0000 mg | ORAL_TABLET | Freq: Every day | ORAL | 5 refills | Status: DC

## 2017-06-09 NOTE — Progress Notes (Signed)
Subjective:  Patient ID: Grace Stokes,  female    DOB: 1955-11-30  Age: 62 y.o.    CC: No chief complaint on file.   HPI Grace Stokes presents for  follow-up of hypertension. Patient has no history of headache chest pain or shortness of breath or recent cough. Patient also denies symptoms of TIA such as numbness weakness lateralizing. Patient denies side effects from medication. States taking it regularly.   Follow-up of diabetes. Patient recently started to check blood sugar at home. Readings run between 200-260 fasting and 250-300 after eating Patient denies symptoms such as polyuria, polydipsia, excessive hunger, nausea No significant hypoglycemic spells noted. Medications reviewed. Having difficulty affording Grace Stokes meds. Says that Part D premium of $200 a month is not affordable. Grace Stokes is getting Grace Stokes insulin from a friend. Pt. denies complication/adverse reaction today.   Evasive regarding heroin use. Declines to leave urine specimen.  History Grace Stokes has a past medical history of Arthritis, Bell's palsy, Cataract, Chronic pain, Renal disorder, Scoliosis, and Toxemia in pregnancy.   Grace Stokes has a past surgical history that includes Back surgery; Cesarean section; Tubal ligation; Shoulder surgery; Rotator cuff repair; Carpal tunnel release; Anterior cervical decomp/discectomy fusion (N/A, 07/09/2013); Incision / drainage hand / finger (Left, 12/17/2016); and Grace Stokes&D extremity (Left, 12/17/2016).   Grace Stokes family history is not on file.Grace Stokes reports that Grace Stokes has quit smoking. Grace Stokes smoking use included cigarettes. Grace Stokes has never used smokeless tobacco. Grace Stokes reports that Grace Stokes uses drugs. Grace Stokes reports that Grace Stokes does not drink alcohol.  Current Outpatient Medications on File Prior to Visit  Medication Sig Dispense Refill  . blood glucose meter kit and supplies KIT Dispense based on patient and insurance preference. Use up to four times daily as directed. (FOR ICD-9 250.00, 250.01). 1 each 0  . ibuprofen  (MOTRIN IB) 200 MG tablet Take 3 tablets (600 mg total) by mouth every 6 (six) hours as needed for fever, mild pain or moderate pain.    Marland Kitchen insulin aspart protamine - aspart (NOVOLOG MIX 70/30 FLEXPEN) (70-30) 100 UNIT/ML FlexPen Inject 40 units with breakfast and 30 units with supper. 15 mL 11  . Insulin Syringe-Needle U-100 (SAFETY-GLIDE 0.3CC SYR 29GX1/2) 29G X 1/2" 0.3 ML MISC Use as directed, 2 times daily with meals. 100 each 0  . lisinopril (PRINIVIL,ZESTRIL) 10 MG tablet Take 1 tablet (10 mg total) by mouth daily. For kidney protection 90 tablet 3  . TRUEPLUS LANCETS 30G MISC Check blood sugar up to 4 times a day 200 each 1   No current facility-administered medications on file prior to visit.     ROS Review of Systems  Constitutional: Negative for activity change, appetite change and fever.  HENT: Negative for congestion, rhinorrhea and sore throat.   Eyes: Negative for visual disturbance.  Respiratory: Negative for cough and shortness of breath.   Cardiovascular: Negative for chest pain and palpitations.  Gastrointestinal: Negative for abdominal pain, diarrhea and nausea.  Genitourinary: Negative for dysuria.  Musculoskeletal: Negative for arthralgias and myalgias.    Objective:  BP (!) 165/88   Pulse 99   Temp 98.1 F (36.7 C) (Oral)   Ht '5\' 1"'  (1.549 m)   Wt 150 lb (68 kg)   LMP 04/06/2011   BMI 28.34 kg/m   BP Readings from Last 3 Encounters:  06/09/17 (!) 165/88  01/26/17 129/84  01/13/17 137/86    Wt Readings from Last 3 Encounters:  06/09/17 150 lb (68 kg)  01/26/17 143 lb (64.9 kg)  01/13/17 146 lb (66.2 kg)     Physical Exam  Constitutional: Grace Stokes is oriented to person, place, and time. Grace Stokes appears well-developed and well-nourished. No distress.  HENT:  Head: Normocephalic and atraumatic.  Right Ear: External ear normal.  Left Ear: External ear normal.  Nose: Nose normal.  Mouth/Throat: Oropharynx is clear and moist.  Eyes: Conjunctivae and EOM are  normal. Pupils are equal, round, and reactive to light.  Lid lag on right. Left does not close / proptosis  Neck: Normal range of motion. Neck supple. No thyromegaly present.  Cardiovascular: Normal rate, regular rhythm and normal heart sounds.  No murmur heard. Pulmonary/Chest: Effort normal and breath sounds normal. No respiratory distress. Grace Stokes has no wheezes. Grace Stokes has no rales.  Abdominal: Soft. Bowel sounds are normal. Grace Stokes exhibits no distension. There is no tenderness.  Lymphadenopathy:    Grace Stokes has no cervical adenopathy.  Neurological: Grace Stokes is alert and oriented to person, place, and time. Grace Stokes has normal reflexes.  Skin: Skin is warm and dry.  Psychiatric: Grace Stokes has a normal mood and affect. Grace Stokes behavior is normal. Judgment and thought content normal.    Diabetic Foot Exam - Simple   Simple Foot Form Diabetic Foot exam was performed with the following findings:  Yes 06/09/2017  2:29 PM  Visual Inspection No deformities, no ulcerations, no other skin breakdown bilaterally:  Yes Sensation Testing Intact to touch and monofilament testing bilaterally:  Yes Pulse Check Posterior Tibialis and Dorsalis pulse intact bilaterally:  Yes Comments       Assessment & Plan:   Diagnoses and all orders for this visit:  Uncontrolled type 2 diabetes mellitus with complication, with long-term current use of insulin (HCC) -     Microalbumin / creatinine urine ratio -     Urinalysis -     Bayer DCA Hb A1c Waived  Essential hypertension -     CBC with Differential/Platelet -     CMP14+EGFR  Heroin abuse (HCC)  Mixed hyperlipidemia -     Lipid panel  Other orders -     amLODipine (NORVASC) 5 MG tablet; Take 1 tablet (5 mg total) by mouth daily. For blood pressure -     cyclobenzaprine (FLEXERIL) 10 MG tablet; Take 1 tablet (10 mg total) by mouth 2 (two) times daily as needed for muscle spasms. -     metoprolol succinate (TOPROL-XL) 50 MG 24 hr tablet; Take 1 tablet (50 mg total) by mouth  daily. For heart and blood pressure   Grace Stokes am having [name]. Hohmann maintain Grace Stokes ibuprofen, Insulin Syringe-Needle U-100, blood glucose meter kit and supplies, lisinopril, insulin aspart protamine - aspart, TRUEPLUS LANCETS 30G, amLODipine, cyclobenzaprine, and metoprolol succinate.  Meds ordered this encounter  Medications  . amLODipine (NORVASC) 5 MG tablet    Sig: Take 1 tablet (5 mg total) by mouth daily. For blood pressure    Dispense:  30 tablet    Refill:  5  . cyclobenzaprine (FLEXERIL) 10 MG tablet    Sig: Take 1 tablet (10 mg total) by mouth 2 (two) times daily as needed for muscle spasms.    Dispense:  60 tablet    Refill:  5  . metoprolol succinate (TOPROL-XL) 50 MG 24 hr tablet    Sig: Take 1 tablet (50 mg total) by mouth daily. For heart and blood pressure    Dispense:  30 tablet    Refill:  5   Med changes pending for results of A1c  Follow-up: Return  in about 3 months (around 09/06/2017).  Claretta Fraise, M.D.

## 2017-06-09 NOTE — Patient Instructions (Signed)
Go to [facility] for diabetic eye exam soon!!!

## 2017-06-10 LAB — CBC WITH DIFFERENTIAL/PLATELET
BASOS: 1 %
Basophils Absolute: 0 10*3/uL (ref 0.0–0.2)
EOS (ABSOLUTE): 0 10*3/uL (ref 0.0–0.4)
EOS: 1 %
HEMATOCRIT: 34.3 % (ref 34.0–46.6)
HEMOGLOBIN: 11.5 g/dL (ref 11.1–15.9)
Immature Grans (Abs): 0 10*3/uL (ref 0.0–0.1)
Immature Granulocytes: 0 %
LYMPHS ABS: 0.6 10*3/uL — AB (ref 0.7–3.1)
Lymphs: 30 %
MCH: 29.6 pg (ref 26.6–33.0)
MCHC: 33.5 g/dL (ref 31.5–35.7)
MCV: 88 fL (ref 79–97)
MONOS ABS: 0.3 10*3/uL (ref 0.1–0.9)
Monocytes: 12 %
Neutrophils Absolute: 1.2 10*3/uL — ABNORMAL LOW (ref 1.4–7.0)
Neutrophils: 56 %
Platelets: 66 10*3/uL — CL (ref 150–379)
RBC: 3.88 x10E6/uL (ref 3.77–5.28)
RDW: 15.3 % (ref 12.3–15.4)
WBC: 2.1 10*3/uL — CL (ref 3.4–10.8)

## 2017-06-10 LAB — CMP14+EGFR
A/G RATIO: 0.6 — AB (ref 1.2–2.2)
ALK PHOS: 158 IU/L — AB (ref 39–117)
ALT: 73 IU/L — ABNORMAL HIGH (ref 0–32)
AST: 113 IU/L — ABNORMAL HIGH (ref 0–40)
Albumin: 3 g/dL — ABNORMAL LOW (ref 3.6–4.8)
BILIRUBIN TOTAL: 1.1 mg/dL (ref 0.0–1.2)
BUN / CREAT RATIO: 14 (ref 12–28)
BUN: 9 mg/dL (ref 8–27)
CHLORIDE: 105 mmol/L (ref 96–106)
CO2: 24 mmol/L (ref 20–29)
CREATININE: 0.63 mg/dL (ref 0.57–1.00)
Calcium: 8.4 mg/dL — ABNORMAL LOW (ref 8.7–10.3)
GFR calc Af Amer: 112 mL/min/{1.73_m2} (ref >59)
GFR calc non Af Amer: 97 mL/min/{1.73_m2} (ref >59)
GLOBULIN, TOTAL: 5.4 g/dL — AB (ref 1.5–4.5)
Glucose: 254 mg/dL — ABNORMAL HIGH (ref 65–99)
POTASSIUM: 3.6 mmol/L (ref 3.5–5.2)
SODIUM: 138 mmol/L (ref 134–144)
Total Protein: 8.4 g/dL (ref 6.0–8.5)

## 2017-06-10 LAB — LIPID PANEL
CHOLESTEROL TOTAL: 137 mg/dL (ref 100–199)
Chol/HDL Ratio: 3 ratio (ref 0.0–4.4)
HDL: 46 mg/dL (ref >39)
LDL CALC: 67 mg/dL (ref 0–99)
Triglycerides: 122 mg/dL (ref 0–149)
VLDL Cholesterol Cal: 24 mg/dL (ref 5–40)

## 2017-07-13 DIAGNOSIS — E113293 Type 2 diabetes mellitus with mild nonproliferative diabetic retinopathy without macular edema, bilateral: Secondary | ICD-10-CM | POA: Diagnosis not present

## 2017-07-13 DIAGNOSIS — H353131 Nonexudative age-related macular degeneration, bilateral, early dry stage: Secondary | ICD-10-CM | POA: Diagnosis not present

## 2017-07-13 DIAGNOSIS — Z9849 Cataract extraction status, unspecified eye: Secondary | ICD-10-CM | POA: Diagnosis not present

## 2017-07-13 DIAGNOSIS — Z794 Long term (current) use of insulin: Secondary | ICD-10-CM | POA: Diagnosis not present

## 2017-07-13 LAB — HM DIABETES EYE EXAM

## 2017-07-21 ENCOUNTER — Ambulatory Visit: Payer: Medicare Other | Admitting: *Deleted

## 2017-07-28 ENCOUNTER — Ambulatory Visit: Payer: Medicare Other | Admitting: *Deleted

## 2017-08-04 ENCOUNTER — Ambulatory Visit: Payer: Medicare Other | Admitting: *Deleted

## 2017-08-09 ENCOUNTER — Encounter: Payer: Self-pay | Admitting: Family Medicine

## 2017-09-08 ENCOUNTER — Encounter: Payer: Self-pay | Admitting: Family Medicine

## 2017-09-08 ENCOUNTER — Ambulatory Visit (INDEPENDENT_AMBULATORY_CARE_PROVIDER_SITE_OTHER): Payer: Medicare Other | Admitting: Family Medicine

## 2017-09-08 VITALS — BP 129/73 | HR 74 | Temp 97.7°F | Ht 61.0 in | Wt 132.5 lb

## 2017-09-08 DIAGNOSIS — I10 Essential (primary) hypertension: Secondary | ICD-10-CM | POA: Diagnosis not present

## 2017-09-08 DIAGNOSIS — E1165 Type 2 diabetes mellitus with hyperglycemia: Secondary | ICD-10-CM | POA: Diagnosis not present

## 2017-09-08 DIAGNOSIS — F1110 Opioid abuse, uncomplicated: Secondary | ICD-10-CM

## 2017-09-08 DIAGNOSIS — E118 Type 2 diabetes mellitus with unspecified complications: Secondary | ICD-10-CM | POA: Diagnosis not present

## 2017-09-08 DIAGNOSIS — Z794 Long term (current) use of insulin: Secondary | ICD-10-CM | POA: Diagnosis not present

## 2017-09-08 DIAGNOSIS — F1120 Opioid dependence, uncomplicated: Secondary | ICD-10-CM | POA: Diagnosis not present

## 2017-09-08 DIAGNOSIS — IMO0002 Reserved for concepts with insufficient information to code with codable children: Secondary | ICD-10-CM

## 2017-09-08 LAB — BAYER DCA HB A1C WAIVED: HB A1C: 9.9 % — AB (ref <7.0)

## 2017-09-08 MED ORDER — INSULIN NPH (HUMAN) (ISOPHANE) 100 UNIT/ML ~~LOC~~ SUSP: 20.0000 [IU] | Freq: Two times a day (BID) | SUBCUTANEOUS | 11 refills | Status: DC

## 2017-09-08 NOTE — Progress Notes (Addendum)
Subjective:  Patient ID: Grace Stokes,  female    DOB: Jul 28, 1955  Age: 62 y.o.    CC: Medical Management of Chronic Issues   HPI Grace Stokes presents for  follow-up of hypertension. Patient has no history of headache chest pain or shortness of breath or recent cough. Patient also denies symptoms of TIA such as numbness weakness lateralizing. Patient denies side effects from medication. States taking it regularly. DCed heroin 2 weeks ago. Refuses rehab.  Patient also  in for follow-up of elevated cholesterol. Doing well without complaints on current medication. Denies side effects  including myalgia and arthralgia and nausea. Also in today for liver function testing. Currently no chest pain, shortness of breath or other cardiovascular related symptoms noted.  Follow-up of diabetes. Patient does not check blood sugar at home. Patient denies symptoms such as excessive hunger or urinary frequency, excessive hunger, nausea No significant hypoglycemic spells noted. Medications reviewed. Pt reports she could not afford meds so she DCed several weeks ago. Insulin was DCed right after last office visit.  History Grace Stokes has a past medical history of Arthritis, Bell's palsy, Cataract, Chronic pain, Renal disorder, Scoliosis, and Toxemia in pregnancy.   She has a past surgical history that includes Back surgery; Cesarean section; Tubal ligation; Shoulder surgery; Rotator cuff repair; Carpal tunnel release; Anterior cervical decomp/discectomy fusion (N/A, 07/09/2013); Incision / drainage hand / finger (Left, 12/17/2016); and I&D extremity (Left, 12/17/2016).   Her family history is not on file.She reports that she has quit smoking. Her smoking use included cigarettes. She has never used smokeless tobacco. She reports that she has current or past drug history. She reports that she does not drink alcohol.  Current Outpatient Medications on File Prior to Visit  Medication Sig Dispense Refill  .  amLODipine (NORVASC) 5 MG tablet Take 1 tablet (5 mg total) by mouth daily. For blood pressure 30 tablet 5  . cyclobenzaprine (FLEXERIL) 10 MG tablet Take 1 tablet (10 mg total) by mouth 2 (two) times daily as needed for muscle spasms. 60 tablet 5  . ibuprofen (MOTRIN IB) 200 MG tablet Take 3 tablets (600 mg total) by mouth every 6 (six) hours as needed for fever, mild pain or moderate pain.    . Insulin Syringe-Needle U-100 (SAFETY-GLIDE 0.3CC SYR 29GX1/2) 29G X 1/2" 0.3 ML MISC Use as directed, 2 times daily with meals. 100 each 0  . lisinopril (PRINIVIL,ZESTRIL) 10 MG tablet Take 1 tablet (10 mg total) by mouth daily. For kidney protection 90 tablet 3  . metoprolol succinate (TOPROL-XL) 50 MG 24 hr tablet Take 1 tablet (50 mg total) by mouth daily. For heart and blood pressure 30 tablet 5  . TRUEPLUS LANCETS 30G MISC Check blood sugar up to 4 times a day 200 each 1   No current facility-administered medications on file prior to visit.     ROS Review of Systems  Constitutional: Negative.   HENT: Negative.   Eyes: Negative for visual disturbance.  Respiratory: Negative for shortness of breath.   Cardiovascular: Negative for chest pain.  Gastrointestinal: Negative for abdominal pain.  Musculoskeletal: Negative for arthralgias.    Objective:  BP 129/73   Pulse 74   Temp 97.7 F (36.5 C) (Oral)   Ht _0  (1.549 m)   Wt 132 lb 8 oz (60.1 kg)   LMP 04/06/2011   BMI 25.04 kg/m   BP Readings from Last 3 Encounters:  09/08/17 129/73  06/09/17 (!) 165/88  01/26/17 129/84  Wt Readings from Last 3 Encounters:  09/08/17 132 lb 8 oz (60.1 kg)  06/09/17 150 lb (68 kg)  01/26/17 143 lb (64.9 kg)     Physical Exam  Constitutional: She is oriented to person, place, and time. She appears well-developed and well-nourished. No distress.  HENT:  Head: Normocephalic and atraumatic.  Eyes: Pupils are equal, round, and reactive to light. Conjunctivae are normal.  Neck: Normal range of  motion. Neck supple. No thyromegaly present.  Cardiovascular: Normal rate, regular rhythm and normal heart sounds.  No murmur heard. Pulmonary/Chest: Effort normal and breath sounds normal. No respiratory distress. She has no wheezes. She has no rales.  Abdominal: Soft. Bowel sounds are normal. She exhibits no distension. There is no tenderness.  Musculoskeletal: Normal range of motion.  Lymphadenopathy:    She has no cervical adenopathy.  Neurological: She is alert and oriented to person, place, and time.  Skin: Skin is warm and dry.  Psychiatric: She has a normal mood and affect. Her behavior is normal. Judgment and thought content normal.    Diabetic Foot Exam - Simple   No data filed        Assessment & Plan:   Grace Stokes was seen today for medical management of chronic issues.  Diagnoses and all orders for this visit:  Uncontrolled type 2 diabetes mellitus with complication, with long-term current use of insulin (HCC) -     CMP14+EGFR -     C-peptide -     Bayer DCA Hb A1c Waived  Essential hypertension  Uncomplicated opioid dependence ([place])  Heroin abuse ([place])  Other orders -     insulin NPH Human (HUMULIN N,NOVOLIN N) 100 UNIT/ML injection; Inject 0.2 mLs (20 Units total) into the skin 2 (two) times daily before a meal. With breakfast and supper   I have discontinued Grace Stokes's blood glucose meter kit and supplies and insulin aspart protamine - aspart. I am also having her start on insulin NPH Human. Additionally, I am having her maintain her ibuprofen, Insulin Syringe-Needle U-100, lisinopril, TRUEPLUS LANCETS 30G, amLODipine, cyclobenzaprine, and metoprolol succinate.  Meds ordered this encounter  Medications  . insulin NPH Human (HUMULIN N,NOVOLIN N) 100 UNIT/ML injection    Sig: Inject 0.2 mLs (20 Units total) into the skin 2 (two) times daily before a meal. With breakfast and supper    Dispense:  20 mL    Refill:  11     Follow-up: Return in about 3  months (around 12/09/2017).  Claretta Fraise, M.D.

## 2017-09-08 NOTE — Patient Instructions (Signed)
Small bed time snack is necessary: 1 cup of yogurt or 2 sheets of graham cracker  Avoid sodas of all types including diet soda.  Record glucose fasting, before supper and at bed time.

## 2017-09-09 LAB — CMP14+EGFR
A/G RATIO: 0.6 — AB (ref 1.2–2.2)
ALBUMIN: 3 g/dL — AB (ref 3.6–4.8)
ALT: 41 IU/L — ABNORMAL HIGH (ref 0–32)
AST: 64 IU/L — ABNORMAL HIGH (ref 0–40)
Alkaline Phosphatase: 106 IU/L (ref 39–117)
BUN / CREAT RATIO: 13 (ref 12–28)
BUN: 8 mg/dL (ref 8–27)
Bilirubin Total: 1.1 mg/dL (ref 0.0–1.2)
CALCIUM: 9.1 mg/dL (ref 8.7–10.3)
CO2: 24 mmol/L (ref 20–29)
Chloride: 100 mmol/L (ref 96–106)
Creatinine, Ser: 0.61 mg/dL (ref 0.57–1.00)
GFR, EST AFRICAN AMERICAN: 113 mL/min/{1.73_m2} (ref >59)
GFR, EST NON AFRICAN AMERICAN: 98 mL/min/{1.73_m2} (ref >59)
GLUCOSE: 194 mg/dL — AB (ref 65–99)
Globulin, Total: 5 g/dL — ABNORMAL HIGH (ref 1.5–4.5)
Potassium: 4.7 mmol/L (ref 3.5–5.2)
Sodium: 135 mmol/L (ref 134–144)
TOTAL PROTEIN: 8 g/dL (ref 6.0–8.5)

## 2017-09-09 LAB — C-PEPTIDE: C-Peptide: 5 ng/mL — ABNORMAL HIGH (ref 1.1–4.4)

## 2017-09-12 ENCOUNTER — Encounter: Payer: Self-pay | Admitting: Family Medicine

## 2017-10-08 ENCOUNTER — Encounter: Payer: Self-pay | Admitting: Family Medicine

## 2017-10-08 ENCOUNTER — Ambulatory Visit (INDEPENDENT_AMBULATORY_CARE_PROVIDER_SITE_OTHER): Payer: Medicare Other | Admitting: Family Medicine

## 2017-10-08 VITALS — BP 136/75 | HR 95 | Temp 97.2°F | Ht 61.0 in | Wt 140.0 lb

## 2017-10-08 DIAGNOSIS — E118 Type 2 diabetes mellitus with unspecified complications: Secondary | ICD-10-CM | POA: Diagnosis not present

## 2017-10-08 DIAGNOSIS — G959 Disease of spinal cord, unspecified: Secondary | ICD-10-CM

## 2017-10-08 DIAGNOSIS — I10 Essential (primary) hypertension: Secondary | ICD-10-CM | POA: Diagnosis not present

## 2017-10-08 DIAGNOSIS — E1165 Type 2 diabetes mellitus with hyperglycemia: Secondary | ICD-10-CM | POA: Diagnosis not present

## 2017-10-08 DIAGNOSIS — Z794 Long term (current) use of insulin: Secondary | ICD-10-CM

## 2017-10-08 DIAGNOSIS — IMO0002 Reserved for concepts with insufficient information to code with codable children: Secondary | ICD-10-CM

## 2017-10-08 MED ORDER — INSULIN NPH (HUMAN) (ISOPHANE) 100 UNIT/ML ~~LOC~~ SUSP: 25.0000 [IU] | Freq: Two times a day (BID) | SUBCUTANEOUS | 11 refills | Status: DC

## 2017-10-08 NOTE — Progress Notes (Signed)
Subjective:  Patient ID: CHRISTAL LAGERSTROM, female    DOB: 08/20/1955  Age: 62 y.o. MRN: [id number]  CC: Diabetes (pt here today for 1 month follow up after starting Humulin)   HPI ONEAL BIGLOW presents forFollow-up of diabetes. Patient checks blood sugar at home.  142 160 occasionally up to 180 fasting and 1 50-200  postprandial Patient denies symptoms such as polyuria, polydipsia, excessive hunger, nausea No significant hypoglycemic spells noted. Medications reviewed. Pt reports taking them regularly without complication/adverse reaction being reported today.   History Keyasha has a past medical history of Arthritis, Bell's palsy, Cataract, Chronic pain, Renal disorder, Scoliosis, and Toxemia in pregnancy.   She has a past surgical history that includes Back surgery; Cesarean section; Tubal ligation; Shoulder surgery; Rotator cuff repair; Carpal tunnel release; Anterior cervical decomp/discectomy fusion (N/A, 07/09/2013); Incision / drainage hand / finger (Left, 12/17/2016); and I&D extremity (Left, 12/17/2016).   Her family history is not on file.She reports that she has quit smoking. Her smoking use included cigarettes. She has never used smokeless tobacco. She reports that she has current or past drug history. She reports that she does not drink alcohol.  Current Outpatient Medications on File Prior to Visit  Medication Sig Dispense Refill  . amLODipine (NORVASC) 5 MG tablet Take 1 tablet (5 mg total) by mouth daily. For blood pressure 30 tablet 5  . cyclobenzaprine (FLEXERIL) 10 MG tablet Take 1 tablet (10 mg total) by mouth 2 (two) times daily as needed for muscle spasms. 60 tablet 5  . ibuprofen (MOTRIN IB) 200 MG tablet Take 3 tablets (600 mg total) by mouth every 6 (six) hours as needed for fever, mild pain or moderate pain.    . Insulin Syringe-Needle U-100 (SAFETY-GLIDE 0.3CC SYR 29GX1/2) 29G X 1/2" 0.3 ML MISC Use as directed, 2 times daily with meals. 100 each 0  . lisinopril  (PRINIVIL,ZESTRIL) 10 MG tablet Take 1 tablet (10 mg total) by mouth daily. For kidney protection 90 tablet 3  . metoprolol succinate (TOPROL-XL) 50 MG 24 hr tablet Take 1 tablet (50 mg total) by mouth daily. For heart and blood pressure 30 tablet 5  . TRUEPLUS LANCETS 30G MISC Check blood sugar up to 4 times a day 200 each 1   No current facility-administered medications on file prior to visit.     ROS Review of Systems  Constitutional: Negative.   HENT: Negative for congestion.   Eyes: Negative for visual disturbance.  Respiratory: Negative for shortness of breath.   Cardiovascular: Negative for chest pain.  Gastrointestinal: Negative for abdominal pain, constipation, diarrhea, nausea and vomiting.  Genitourinary: Negative for difficulty urinating.  Musculoskeletal: Positive for arthralgias and back pain. Negative for myalgias.  Neurological: Negative for headaches.  Psychiatric/Behavioral: Negative for sleep disturbance.    Objective:  BP 136/75   Pulse 95   Temp (!) 97.2 F (36.2 C) (Oral)   Ht 5\' 1"  (1.549 m)   Wt 140 lb (63.5 kg)   LMP 04/06/2011   BMI 26.45 kg/m   BP Readings from Last 3 Encounters:  10/08/17 136/75  09/08/17 129/73  06/09/17 (!) 165/88    Wt Readings from Last 3 Encounters:  10/08/17 140 lb (63.5 kg)  09/08/17 132 lb 8 oz (60.1 kg)  06/09/17 150 lb (68 kg)     Physical Exam  Constitutional: She is oriented to person, place, and time. She appears well-developed and well-nourished. No distress.  HENT:  Head: Normocephalic and atraumatic.  Right Ear:  External ear normal.  Left Ear: External ear normal.  Nose: Nose normal.  Mouth/Throat: Oropharynx is clear and moist.  Eyes: Pupils are equal, round, and reactive to light. Conjunctivae and EOM are normal.  Neck: Normal range of motion. Neck supple. No thyromegaly present.  Cardiovascular: Normal rate, regular rhythm and normal heart sounds.  No murmur heard. Pulmonary/Chest: Effort normal  and breath sounds normal. No respiratory distress. She has no wheezes. She has no rales.  Abdominal: Soft. Bowel sounds are normal. She exhibits no distension. There is no tenderness.  Lymphadenopathy:    She has no cervical adenopathy.  Neurological: She is alert and oriented to person, place, and time. She has normal reflexes.  Skin: Skin is warm and dry.  Psychiatric: She has a normal mood and affect. Her behavior is normal. Judgment and thought content normal.      Assessment & Plan:   Iness was seen today for diabetes.  Diagnoses and all orders for this visit:  Uncontrolled type 2 diabetes mellitus with complication, with long-term current use of insulin ([place])  Essential hypertension  Cervical myelopathy ([place])  Other orders -     insulin NPH Human (HUMULIN N,NOVOLIN N) 100 UNIT/ML injection; Inject 0.25 mLs (25 Units total) into the skin 2 (two) times daily before a meal. With breakfast and supper      I have changed [name]. Bartolome's insulin NPH Human. I am also having her maintain her ibuprofen, Insulin Syringe-Needle U-100, lisinopril, TRUEPLUS LANCETS 30G, amLODipine, cyclobenzaprine, and metoprolol succinate.  Meds ordered this encounter  Medications  . insulin NPH Human (HUMULIN N,NOVOLIN N) 100 UNIT/ML injection    Sig: Inject 0.25 mLs (25 Units total) into the skin 2 (two) times daily before a meal. With breakfast and supper    Dispense:  20 mL    Refill:  11     Follow-up: Return in about 1 month (around 11/07/2017).  Claretta Fraise, M.D.

## 2017-11-24 ENCOUNTER — Encounter: Payer: Self-pay | Admitting: Family Medicine

## 2017-11-24 ENCOUNTER — Ambulatory Visit (INDEPENDENT_AMBULATORY_CARE_PROVIDER_SITE_OTHER): Payer: Medicare Other | Admitting: Family Medicine

## 2017-11-24 VITALS — BP 155/85 | HR 107 | Temp 98.2°F | Ht 61.0 in | Wt 141.2 lb

## 2017-11-24 DIAGNOSIS — E118 Type 2 diabetes mellitus with unspecified complications: Secondary | ICD-10-CM

## 2017-11-24 DIAGNOSIS — Z794 Long term (current) use of insulin: Secondary | ICD-10-CM

## 2017-11-24 DIAGNOSIS — I10 Essential (primary) hypertension: Secondary | ICD-10-CM | POA: Diagnosis not present

## 2017-11-24 DIAGNOSIS — E1165 Type 2 diabetes mellitus with hyperglycemia: Secondary | ICD-10-CM

## 2017-11-24 DIAGNOSIS — Z029 Encounter for administrative examinations, unspecified: Secondary | ICD-10-CM

## 2017-11-24 DIAGNOSIS — IMO0002 Reserved for concepts with insufficient information to code with codable children: Secondary | ICD-10-CM

## 2017-11-28 ENCOUNTER — Encounter: Payer: Self-pay | Admitting: Family Medicine

## 2017-11-28 NOTE — Progress Notes (Signed)
Subjective:  Patient ID: Grace Stokes, female    DOB: 1955-06-24  Age: 62 y.o. MRN: [id number]  CC: Diabetes (pt here today for 1 month follow up and she hasn't been able to take her insulin because it costs too much)   HPI Grace Stokes presents forFollow-up of diabetes. Patient checks blood sugar at home.  Glucose in the mid 200s most of the time. Could not afford insulin.  Patient denies symptoms such as polyuria, polydipsia, excessive hunger, nausea No significant hypoglycemic spells noted. Medications reviewed. Pt reports taking them regularly without complication/adverse reaction being reported today.   History Grace Stokes has a past medical history of Arthritis, Bell's palsy, Cataract, Chronic pain, Renal disorder, Scoliosis, and Toxemia in pregnancy.   She has a past surgical history that includes Back surgery; Cesarean section; Tubal ligation; Shoulder surgery; Rotator cuff repair; Carpal tunnel release; Anterior cervical decomp/discectomy fusion (N/A, 07/09/2013); Incision / drainage hand / finger (Left, 12/17/2016); and I&D extremity (Left, 12/17/2016).   Her family history is not on file.She reports that she has quit smoking. Her smoking use included cigarettes. She has never used smokeless tobacco. She reports that she has current or past drug history. She reports that she does not drink alcohol.  Current Outpatient Medications on File Prior to Visit  Medication Sig Dispense Refill  . amLODipine (NORVASC) 5 MG tablet Take 1 tablet (5 mg total) by mouth daily. For blood pressure 30 tablet 5  . cyclobenzaprine (FLEXERIL) 10 MG tablet Take 1 tablet (10 mg total) by mouth 2 (two) times daily as needed for muscle spasms. 60 tablet 5  . ibuprofen (MOTRIN IB) 200 MG tablet Take 3 tablets (600 mg total) by mouth every 6 (six) hours as needed for fever, mild pain or moderate pain.    Marland Kitchen lisinopril (PRINIVIL,ZESTRIL) 10 MG tablet Take 1 tablet (10 mg total) by mouth daily. For kidney  protection 90 tablet 3  . metoprolol succinate (TOPROL-XL) 50 MG 24 hr tablet Take 1 tablet (50 mg total) by mouth daily. For heart and blood pressure 30 tablet 5  . TRUEPLUS LANCETS 30G MISC Check blood sugar up to 4 times a day 200 each 1  . insulin NPH Human (HUMULIN N,NOVOLIN N) 100 UNIT/ML injection Inject 0.25 mLs (25 Units total) into the skin 2 (two) times daily before a meal. With breakfast and supper (Patient not taking: Reported on 11/24/2017) 20 mL 11  . Insulin Syringe-Needle U-100 (SAFETY-GLIDE 0.3CC SYR 29GX1/2) 29G X 1/2" 0.3 ML MISC Use as directed, 2 times daily with meals. (Patient not taking: Reported on 11/24/2017) 100 each 0   No current facility-administered medications on file prior to visit.     ROS Review of Systems  Constitutional: Negative.   HENT: Negative for congestion.   Eyes: Negative for visual disturbance.  Respiratory: Negative for shortness of breath.   Cardiovascular: Negative for chest pain.  Gastrointestinal: Negative for abdominal pain, constipation, diarrhea, nausea and vomiting.  Genitourinary: Negative for difficulty urinating.  Musculoskeletal: Positive for arthralgias and back pain. Negative for myalgias.  Neurological: Negative for headaches.  Psychiatric/Behavioral: Negative for sleep disturbance.    Objective:  BP (!) 155/85   Pulse (!) 107   Temp 98.2 F (36.8 C) (Oral)   Ht 5\' 1"  (1.549 m)   Wt 141 lb 4 oz (64.1 kg)   LMP 04/06/2011   BMI 26.69 kg/m   BP Readings from Last 3 Encounters:  11/24/17 (!) 155/85  10/08/17 136/75  09/08/17 129/73  Wt Readings from Last 3 Encounters:  11/24/17 141 lb 4 oz (64.1 kg)  10/08/17 140 lb (63.5 kg)  09/08/17 132 lb 8 oz (60.1 kg)     Physical Exam  Constitutional: She is oriented to person, place, and time. She appears well-developed and well-nourished. No distress.  HENT:  Head: Normocephalic and atraumatic.  Right Ear: External ear normal.  Left Ear: External ear normal.    Nose: Nose normal.  Mouth/Throat: Oropharynx is clear and moist.  Eyes: Pupils are equal, round, and reactive to light. Conjunctivae and EOM are normal.  Neck: Normal range of motion. Neck supple. No thyromegaly present.  Cardiovascular: Normal rate, regular rhythm and normal heart sounds.  No murmur heard. Pulmonary/Chest: Effort normal and breath sounds normal. No respiratory distress. She has no wheezes. She has no rales.  Abdominal: Soft. Bowel sounds are normal. She exhibits no distension. There is no tenderness.  Musculoskeletal: She exhibits deformity.  Lymphadenopathy:    She has no cervical adenopathy.  Neurological: She is alert and oriented to person, place, and time. She has normal reflexes. Sensory deficit: Severe dorsal kyphosis.  Skin: Skin is warm and dry.  Psychiatric: She has a normal mood and affect. Her behavior is normal. Judgment and thought content normal.      Assessment & Plan:   Grace Stokes was seen today for diabetes.  Diagnoses and all orders for this visit:  Uncontrolled type 2 diabetes mellitus with complication, with long-term current use of insulin ([place])  Essential hypertension      I am having Grace Stokes. Iott maintain her ibuprofen, Insulin Syringe-Needle U-100, lisinopril, TRUEPLUS LANCETS 30G, amLODipine, cyclobenzaprine, metoprolol succinate, and insulin NPH Human.  No orders of the defined types were placed in this encounter.    Follow-up: Return in about 1 month (around 12/25/2017).  Claretta Fraise, M.D.

## 2018-01-05 ENCOUNTER — Ambulatory Visit (INDEPENDENT_AMBULATORY_CARE_PROVIDER_SITE_OTHER): Payer: Medicare Other | Admitting: Family Medicine

## 2018-01-05 ENCOUNTER — Encounter: Payer: Self-pay | Admitting: Family Medicine

## 2018-01-05 VITALS — BP 125/72 | HR 94 | Temp 97.7°F | Ht 61.0 in | Wt 142.5 lb

## 2018-01-05 DIAGNOSIS — E1165 Type 2 diabetes mellitus with hyperglycemia: Secondary | ICD-10-CM | POA: Diagnosis not present

## 2018-01-05 DIAGNOSIS — Z794 Long term (current) use of insulin: Secondary | ICD-10-CM | POA: Diagnosis not present

## 2018-01-05 DIAGNOSIS — E118 Type 2 diabetes mellitus with unspecified complications: Secondary | ICD-10-CM

## 2018-01-05 DIAGNOSIS — IMO0002 Reserved for concepts with insufficient information to code with codable children: Secondary | ICD-10-CM

## 2018-01-05 DIAGNOSIS — M4712 Other spondylosis with myelopathy, cervical region: Secondary | ICD-10-CM

## 2018-01-05 MED ORDER — INSULIN NPH (HUMAN) (ISOPHANE) 100 UNIT/ML ~~LOC~~ SUSP: 25.0000 [IU] | Freq: Every day | SUBCUTANEOUS | 11 refills | Status: DC

## 2018-01-05 NOTE — Progress Notes (Signed)
Subjective:  Patient ID: Grace Stokes, female    DOB: Dec 18, 1955  Age: 62 y.o. MRN: [id number]  CC: Diabetes (pt here today for 6 week f/u and wasn't able to get the Humulin Insulin due to price)   HPI Grace Stokes presents forFollow-up of diabetes. Patient checks blood sugar at home.  150-200 fasting and between 250 and 300 postprandial Patient denies symptoms such as polyuria, polydipsia, excessive hunger, nausea No significant hypoglycemic spells noted. Patient has been able to find a Novolin and at [facility] in [place] that she can get for approximately $25 per 10 mL vial.  She wants to try that for now.  She is not taking any diabetes medicine currently.  History Grace Stokes has a past medical history of Arthritis, Bell's palsy, Cataract, Chronic pain, Renal disorder, Scoliosis, and Toxemia in pregnancy.   She has a past surgical history that includes Back surgery; Cesarean section; Tubal ligation; Shoulder surgery; Rotator cuff repair; Carpal tunnel release; Anterior cervical decomp/discectomy fusion (N/A, 07/09/2013); Incision / drainage hand / finger (Left, 12/17/2016); and I&D extremity (Left, 12/17/2016).   Her family history is not on file.She reports that she has quit smoking. Her smoking use included cigarettes. She has never used smokeless tobacco. She reports that she has current or past drug history. She reports that she does not drink alcohol.  Current Outpatient Medications on File Prior to Visit  Medication Sig Dispense Refill  . amLODipine (NORVASC) 5 MG tablet Take 1 tablet (5 mg total) by mouth daily. For blood pressure 30 tablet 5  . cyclobenzaprine (FLEXERIL) 10 MG tablet Take 1 tablet (10 mg total) by mouth 2 (two) times daily as needed for muscle spasms. 60 tablet 5  . Insulin Syringe-Needle U-100 (SAFETY-GLIDE 0.3CC SYR 29GX1/2) 29G X 1/2" 0.3 ML MISC Use as directed, 2 times daily with meals. 100 each 0  . lisinopril (PRINIVIL,ZESTRIL) 10 MG tablet Take 1 tablet  (10 mg total) by mouth daily. For kidney protection 90 tablet 3  . metoprolol succinate (TOPROL-XL) 50 MG 24 hr tablet Take 1 tablet (50 mg total) by mouth daily. For heart and blood pressure 30 tablet 5  . TRUEPLUS LANCETS 30G MISC Check blood sugar up to 4 times a day 200 each 1   No current facility-administered medications on file prior to visit.     ROS Review of Systems  Constitutional: Negative for fever.  HENT: Negative for congestion, rhinorrhea and sore throat.   Respiratory: Negative for cough and shortness of breath.   Cardiovascular: Negative for chest pain and palpitations.  Gastrointestinal: Negative for abdominal pain.  Musculoskeletal: Positive for arthralgias and myalgias.    Objective:  BP 125/72   Pulse 94   Temp 97.7 F (36.5 C) (Oral)   Ht 5\' 1"  (1.549 m)   Wt 142 lb 8 oz (64.6 kg)   LMP 04/06/2011   BMI 26.93 kg/m   BP Readings from Last 3 Encounters:  01/05/18 125/72  11/24/17 (!) 155/85  10/08/17 136/75    Wt Readings from Last 3 Encounters:  01/05/18 142 lb 8 oz (64.6 kg)  11/24/17 141 lb 4 oz (64.1 kg)  10/08/17 140 lb (63.5 kg)     Physical Exam  Constitutional: She is oriented to person, place, and time. She appears well-developed and well-nourished. No distress.  Cardiovascular: Normal rate and regular rhythm.  Pulmonary/Chest: Breath sounds normal.  Neurological: She is alert and oriented to person, place, and time.  Skin: Skin is warm and dry.  Psychiatric: She has a normal mood and affect.      Assessment & Plan:   Grace Stokes was seen today for diabetes.  Diagnoses and all orders for this visit:  Uncontrolled type 2 diabetes mellitus with complication, with long-term current use of insulin ([place])  Cervical spondylosis with myelopathy -     DME Other see comment  Other orders -     insulin NPH Human (NOVOLIN N RELION) 100 UNIT/ML injection; Inject 0.25 mLs (25 Units total) into the skin at bedtime.      I have  discontinued [name]. Wiersma's insulin NPH Human. I am also having her start on insulin NPH Human. Additionally, I am having her maintain her Insulin Syringe-Needle U-100, lisinopril, TRUEPLUS LANCETS 30G, amLODipine, cyclobenzaprine, and metoprolol succinate.  Meds ordered this encounter  Medications  . insulin NPH Human (NOVOLIN N RELION) 100 UNIT/ML injection    Sig: Inject 0.25 mLs (25 Units total) into the skin at bedtime.    Dispense:  10 mL    Refill:  11     Follow-up: Return in about 1 month (around 02/04/2018).  Claretta Fraise, M.D.

## 2018-01-07 ENCOUNTER — Telehealth: Payer: Self-pay | Admitting: Family Medicine

## 2018-01-07 MED ORDER — INSULIN NPH (HUMAN) (ISOPHANE) 100 UNIT/ML ~~LOC~~ SUSP: 25.0000 [IU] | Freq: Every day | SUBCUTANEOUS | 11 refills | Status: DC

## 2018-01-07 NOTE — Telephone Encounter (Signed)
Med sent in - pharm fixed

## 2018-02-10 ENCOUNTER — Ambulatory Visit: Payer: Medicare Other | Admitting: Family Medicine

## 2018-02-22 ENCOUNTER — Other Ambulatory Visit: Payer: Self-pay | Admitting: Family Medicine

## 2018-02-22 DIAGNOSIS — Z1231 Encounter for screening mammogram for malignant neoplasm of breast: Secondary | ICD-10-CM

## 2018-03-15 ENCOUNTER — Encounter: Payer: Self-pay | Admitting: Family Medicine

## 2018-03-15 ENCOUNTER — Ambulatory Visit (INDEPENDENT_AMBULATORY_CARE_PROVIDER_SITE_OTHER): Payer: Medicare Other | Admitting: Family Medicine

## 2018-03-15 VITALS — BP 191/99 | HR 110 | Temp 97.7°F | Ht 61.0 in | Wt 147.6 lb

## 2018-03-15 DIAGNOSIS — Z794 Long term (current) use of insulin: Secondary | ICD-10-CM | POA: Diagnosis not present

## 2018-03-15 DIAGNOSIS — IMO0002 Reserved for concepts with insufficient information to code with codable children: Secondary | ICD-10-CM

## 2018-03-15 DIAGNOSIS — Z23 Encounter for immunization: Secondary | ICD-10-CM | POA: Diagnosis not present

## 2018-03-15 DIAGNOSIS — E1165 Type 2 diabetes mellitus with hyperglycemia: Secondary | ICD-10-CM | POA: Diagnosis not present

## 2018-03-15 DIAGNOSIS — E118 Type 2 diabetes mellitus with unspecified complications: Secondary | ICD-10-CM

## 2018-03-15 DIAGNOSIS — F1120 Opioid dependence, uncomplicated: Secondary | ICD-10-CM | POA: Diagnosis not present

## 2018-03-15 DIAGNOSIS — I10 Essential (primary) hypertension: Secondary | ICD-10-CM

## 2018-03-15 LAB — BAYER DCA HB A1C WAIVED: HB A1C: 7.4 % — AB (ref <7.0)

## 2018-03-15 MED ORDER — METOPROLOL SUCCINATE ER 50 MG PO TB24: 50.0000 mg | ORAL_TABLET | Freq: Every day | ORAL | 0 refills | Status: DC

## 2018-03-15 MED ORDER — "INSULIN SYRINGE-NEEDLE U-100 29G X 1/2"" 0.3 ML MISC": 0 refills | Status: DC

## 2018-03-15 MED ORDER — LISINOPRIL 10 MG PO TABS: 10.0000 mg | ORAL_TABLET | Freq: Every day | ORAL | 0 refills | Status: DC

## 2018-03-15 MED ORDER — AMLODIPINE BESYLATE 5 MG PO TABS: 5.0000 mg | ORAL_TABLET | Freq: Every day | ORAL | 0 refills | Status: DC

## 2018-03-15 NOTE — Progress Notes (Signed)
Subjective:  Patient ID: Grace Stokes, female    DOB: 11-17-1955  Age: 62 y.o. MRN: [id number]  CC: Diabetes (3 month follow up); Hypertension; Shoulder Pain (Left. x 3 months); and sore on back of right thigh (infected?)   HPI Grace Stokes presents forFollow-up of diabetes. Patient checks blood sugar at home.   130-170 fasting and 170-250  postprandial Patient denies symptoms such as polyuria, polydipsia, excessive hunger, nausea No significant hypoglycemic spells noted. Medications reviewed. Pt reports taking them regularly without complication/adverse reaction being reported today.  Pt. Off allother meds beside insulion due to cost.  Depression screen [facility] 2/9 03/15/2018 01/05/2018 11/24/2017 10/08/2017 09/08/2017  Decreased Interest 0 0 0 1 0  Down, Depressed, Hopeless 0 0 0 0 0  PHQ - 2 Score 0 0 0 1 0  Altered sleeping - - - - -  Tired, decreased energy - - - - -  Change in appetite - - - - -  Feeling bad or failure about yourself  - - - - -  Trouble concentrating - - - - -  Moving slowly or fidgety/restless - - - - -  PHQ-9 Score - - - - -    History Grace Stokes has a past medical history of Arthritis, Bell's palsy, Cataract, Chronic pain, Renal disorder, Scoliosis, and Toxemia in pregnancy.   She has a past surgical history that includes Back surgery; Cesarean section; Tubal ligation; Shoulder surgery; Rotator cuff repair; Carpal tunnel release; Anterior cervical decomp/discectomy fusion (N/A, 07/09/2013); Incision / drainage hand / finger (Left, 12/17/2016); and I&D extremity (Left, 12/17/2016).   Her family history is not on file.She reports that she has quit smoking. Her smoking use included cigarettes. She has never used smokeless tobacco. She reports that she has current or past drug history. She reports that she does not drink alcohol.  Current Outpatient Medications on File Prior to Visit  Medication Sig Dispense Refill  . cyclobenzaprine (FLEXERIL) 10 MG tablet Take 1  tablet (10 mg total) by mouth 2 (two) times daily as needed for muscle spasms. 60 tablet 5  . insulin NPH Human (NOVOLIN N RELION) 100 UNIT/ML injection Inject 0.25 mLs (25 Units total) into the skin at bedtime. 10 mL 11  . TRUEPLUS LANCETS 30G MISC Check blood sugar up to 4 times a day 200 each 1   No current facility-administered medications on file prior to visit.     ROS Review of Systems  Constitutional: Negative.   HENT: Negative.   Eyes: Negative for visual disturbance.  Respiratory: Negative for shortness of breath.   Cardiovascular: Negative for chest pain.  Gastrointestinal: Negative for abdominal pain.  Musculoskeletal: Negative for arthralgias.    Objective:  BP (!) 191/99   Pulse (!) 110   Temp 97.7 F (36.5 C) (Oral)   Ht 5' 1" (1.549 m)   Wt 147 lb 9.6 oz (67 kg)   LMP 04/06/2011   BMI 27.89 kg/m   BP Readings from Last 3 Encounters:  03/15/18 (!) 191/99  01/05/18 125/72  11/24/17 (!) 155/85    Wt Readings from Last 3 Encounters:  03/15/18 147 lb 9.6 oz (67 kg)  01/05/18 142 lb 8 oz (64.6 kg)  11/24/17 141 lb 4 oz (64.1 kg)     Physical Exam  Constitutional: She is oriented to person, place, and time. She appears well-developed and well-nourished. No distress.  HENT:  Head: Normocephalic and atraumatic.  Right Ear: External ear normal.  Left Ear: External ear normal.  Nose: Nose normal.  Mouth/Throat: Oropharynx is clear and moist.  Eyes: Pupils are equal, round, and reactive to light. Conjunctivae and EOM are normal.  Neck: Normal range of motion. Neck supple. No thyromegaly present.  Cardiovascular: Normal rate, regular rhythm and normal heart sounds.  No murmur heard. Pulmonary/Chest: Effort normal and breath sounds normal. No respiratory distress. She has no wheezes. She has no rales.  Abdominal: Soft. Bowel sounds are normal. She exhibits no distension. There is no tenderness.  Musculoskeletal: She exhibits deformity (dorsal kyphosis ). She  exhibits no edema.  Lymphadenopathy:    She has no cervical adenopathy.  Neurological: She is alert and oriented to person, place, and time. She has normal reflexes.  Skin: Skin is warm and dry.  Psychiatric: She has a normal mood and affect. Her behavior is normal. Judgment and thought content normal.      Assessment & Plan:   Grace Stokes was seen today for diabetes, hypertension, shoulder pain and sore on back of right thigh.  Diagnoses and all orders for this visit:  Uncontrolled type 2 diabetes mellitus with complication, with long-term current use of insulin ([place]) -     Bayer DCA Hb A1c Waived  Accelerated hypertension -     CBC with Differential/Platelet -     [id number]  Uncomplicated opioid dependence (Grace Stokes)  Need for immunization against influenza -     Flu Vaccine QUAD 36+ mos IM  Other orders -     amLODipine (NORVASC) 5 MG tablet; Take 1 tablet (5 mg total) by mouth daily. For blood pressure -     Insulin Syringe-Needle U-100 (SAFETY-GLIDE 0.3CC SYR 29GX1/2) 29G X 1/2" 0.3 ML MISC; Use as directed, 2 times daily with meals. -     lisinopril (PRINIVIL,ZESTRIL) 10 MG tablet; Take 1 tablet (10 mg total) by mouth daily. For kidney protection -     metoprolol succinate (TOPROL-XL) 50 MG 24 hr tablet; Take 1 tablet (50 mg total) by mouth daily. For heart and blood pressure      I am having [name]. Grace Stokes maintain her TRUEPLUS LANCETS 30G, cyclobenzaprine, insulin NPH Human, amLODipine, Insulin Syringe-Needle U-100, lisinopril, and metoprolol succinate.  Meds ordered this encounter  Medications  . amLODipine (NORVASC) 5 MG tablet    Sig: Take 1 tablet (5 mg total) by mouth daily. For blood pressure    Dispense:  90 tablet    Refill:  0  . Insulin Syringe-Needle U-100 (SAFETY-GLIDE 0.3CC SYR 29GX1/2) 29G X 1/2" 0.3 ML MISC    Sig: Use as directed, 2 times daily with meals.    Dispense:  100 each    Refill:  0  . lisinopril (PRINIVIL,ZESTRIL) 10 MG tablet    Sig: Take  1 tablet (10 mg total) by mouth daily. For kidney protection    Dispense:  90 tablet    Refill:  0  . metoprolol succinate (TOPROL-XL) 50 MG 24 hr tablet    Sig: Take 1 tablet (50 mg total) by mouth daily. For heart and blood pressure    Dispense:  90 tablet    Refill:  0   Pt. Was given number to call in [place], [place] program for assistance attaining medications.  Follow-up: Return in about 3 months (around 06/14/2018).  Claretta Fraise, M.D.

## 2018-03-16 LAB — CMP14+EGFR
A/G RATIO: 0.7 — AB (ref 1.2–2.2)
ALT: 49 IU/L — AB (ref 0–32)
AST: 76 IU/L — AB (ref 0–40)
Albumin: 3.2 g/dL — ABNORMAL LOW (ref 3.6–4.8)
Alkaline Phosphatase: 119 IU/L — ABNORMAL HIGH (ref 39–117)
BUN/Creatinine Ratio: 21 (ref 12–28)
BUN: 11 mg/dL (ref 8–27)
Bilirubin Total: 0.9 mg/dL (ref 0.0–1.2)
CALCIUM: 8.4 mg/dL — AB (ref 8.7–10.3)
CO2: 22 mmol/L (ref 20–29)
CREATININE: 0.53 mg/dL — AB (ref 0.57–1.00)
Chloride: 101 mmol/L (ref 96–106)
GFR calc Af Amer: 118 mL/min/{1.73_m2} (ref >59)
GFR, EST NON AFRICAN AMERICAN: 102 mL/min/{1.73_m2} (ref >59)
GLOBULIN, TOTAL: 4.6 g/dL — AB (ref 1.5–4.5)
Glucose: 156 mg/dL — ABNORMAL HIGH (ref 65–99)
POTASSIUM: 3.8 mmol/L (ref 3.5–5.2)
SODIUM: 134 mmol/L (ref 134–144)
TOTAL PROTEIN: 7.8 g/dL (ref 6.0–8.5)

## 2018-03-16 LAB — CBC WITH DIFFERENTIAL/PLATELET
BASOS: 1 %
Basophils Absolute: 0 10*3/uL (ref 0.0–0.2)
EOS (ABSOLUTE): 0.1 10*3/uL (ref 0.0–0.4)
EOS: 2 %
HEMATOCRIT: 34.9 % (ref 34.0–46.6)
Hemoglobin: 12.3 g/dL (ref 11.1–15.9)
IMMATURE GRANS (ABS): 0 10*3/uL (ref 0.0–0.1)
IMMATURE GRANULOCYTES: 0 %
LYMPHS: 30 %
Lymphocytes Absolute: 0.8 10*3/uL (ref 0.7–3.1)
MCH: 30.3 pg (ref 26.6–33.0)
MCHC: 35.2 g/dL (ref 31.5–35.7)
MCV: 86 fL (ref 79–97)
MONOCYTES: 12 %
Monocytes Absolute: 0.3 10*3/uL (ref 0.1–0.9)
NEUTROS ABS: 1.4 10*3/uL (ref 1.4–7.0)
NEUTROS PCT: 55 %
Platelets: 62 10*3/uL — CL (ref 150–450)
RBC: 4.06 x10E6/uL (ref 3.77–5.28)
RDW: 13.4 % (ref 12.3–15.4)
WBC: 2.6 10*3/uL — ABNORMAL LOW (ref 3.4–10.8)

## 2018-04-11 ENCOUNTER — Ambulatory Visit
Admission: RE | Admit: 2018-04-11 | Discharge: 2018-04-11 | Disposition: A | Discharge status: Home / Self Care | Payer: Medicare Other | Source: Ambulatory Visit | Attending: Family Medicine | Admitting: Family Medicine

## 2018-04-11 DIAGNOSIS — Z1231 Encounter for screening mammogram for malignant neoplasm of breast: Secondary | ICD-10-CM

## 2018-06-15 ENCOUNTER — Ambulatory Visit: Payer: Medicare Other | Admitting: Family Medicine

## 2018-06-15 ENCOUNTER — Telehealth: Payer: Self-pay | Admitting: Family Medicine

## 2018-06-21 ENCOUNTER — Ambulatory Visit (INDEPENDENT_AMBULATORY_CARE_PROVIDER_SITE_OTHER): Payer: Medicare Other | Admitting: Family Medicine

## 2018-06-21 ENCOUNTER — Encounter: Payer: Self-pay | Admitting: Family Medicine

## 2018-06-21 VITALS — BP 181/93 | HR 87 | Temp 98.0°F | Ht 61.0 in | Wt 157.4 lb

## 2018-06-21 DIAGNOSIS — Z794 Long term (current) use of insulin: Secondary | ICD-10-CM | POA: Diagnosis not present

## 2018-06-21 DIAGNOSIS — E1165 Type 2 diabetes mellitus with hyperglycemia: Secondary | ICD-10-CM

## 2018-06-21 DIAGNOSIS — F1120 Opioid dependence, uncomplicated: Secondary | ICD-10-CM | POA: Diagnosis not present

## 2018-06-21 DIAGNOSIS — E118 Type 2 diabetes mellitus with unspecified complications: Secondary | ICD-10-CM

## 2018-06-21 DIAGNOSIS — E782 Mixed hyperlipidemia: Secondary | ICD-10-CM

## 2018-06-21 DIAGNOSIS — I10 Essential (primary) hypertension: Secondary | ICD-10-CM

## 2018-06-21 DIAGNOSIS — IMO0002 Reserved for concepts with insufficient information to code with codable children: Secondary | ICD-10-CM

## 2018-06-21 LAB — BAYER DCA HB A1C WAIVED: HB A1C (BAYER DCA - WAIVED): 7.6 % — ABNORMAL HIGH (ref <7.0)

## 2018-06-21 MED ORDER — CYCLOBENZAPRINE HCL 10 MG PO TABS: 10.0000 mg | ORAL_TABLET | Freq: Two times a day (BID) | ORAL | 5 refills | Status: DC | PRN

## 2018-06-21 MED ORDER — METOPROLOL SUCCINATE ER 50 MG PO TB24: 50.0000 mg | ORAL_TABLET | Freq: Every day | ORAL | 1 refills | Status: DC

## 2018-06-21 MED ORDER — AMLODIPINE BESYLATE 5 MG PO TABS: 5.0000 mg | ORAL_TABLET | Freq: Every day | ORAL | 1 refills | Status: DC

## 2018-06-21 MED ORDER — LISINOPRIL 10 MG PO TABS: 10.0000 mg | ORAL_TABLET | Freq: Every day | ORAL | 1 refills | Status: DC

## 2018-06-21 NOTE — Progress Notes (Signed)
Subjective:  Patient ID: Grace Stokes, female    DOB: 09/24/1955  Age: 63 y.o. MRN: [id number]  CC: Medical Management of Chronic Issues   HPI Grace Stokes presents forFollow-up of diabetes. Patient checks blood sugar at home.   120-190 fasting and 200-300 postprandial. Log reviewed and scanned. Patient denies symptoms such as polyuria, polydipsia, excessive hunger, nausea No significant hypoglycemic spells noted. Medications reviewed. Pt reports taking them regularly without complication/adverse reaction being reported today.  Checking feet daily. Last eye appt was remote Depression screen [facility] 2/9 06/21/2018 03/15/2018 01/05/2018 11/24/2017 10/08/2017  Decreased Interest 0 0 0 0 1  Down, Depressed, Hopeless 0 0 0 0 0  PHQ - 2 Score 0 0 0 0 1  Altered sleeping - - - - -  Tired, decreased energy - - - - -  Change in appetite - - - - -  Feeling bad or failure about yourself  - - - - -  Trouble concentrating - - - - -  Moving slowly or fidgety/restless - - - - -  PHQ-9 Score - - - - -    History Grace Stokes has a past medical history of Arthritis, Bell's palsy, Cataract, Chronic pain, Renal disorder, Scoliosis, and Toxemia in pregnancy.   She has a past surgical history that includes Back surgery; Cesarean section; Tubal ligation; Shoulder surgery; Rotator cuff repair; Carpal tunnel release; Anterior cervical decomp/discectomy fusion (N/A, 07/09/2013); Incision / drainage hand / finger (Left, 12/17/2016); and I&D extremity (Left, 12/17/2016).   Her family history is not on file.She reports that she has quit smoking. Her smoking use included cigarettes. She has never used smokeless tobacco. She reports current drug use. She reports that she does not drink alcohol.  Current Outpatient Medications on File Prior to Visit  Medication Sig Dispense Refill  . Insulin Syringe-Needle U-100 (SAFETY-GLIDE 0.3CC SYR 29GX1/2) 29G X 1/2" 0.3 ML MISC Use as directed, 2 times daily with meals. 100 each 0    . TRUEPLUS LANCETS 30G MISC Check blood sugar up to 4 times a day 200 each 1   No current facility-administered medications on file prior to visit.     ROS Review of Systems  Constitutional: Negative.   HENT: Negative for congestion.   Eyes: Negative for visual disturbance.  Respiratory: Negative for shortness of breath.   Cardiovascular: Negative for chest pain.  Gastrointestinal: Negative for abdominal pain, constipation, diarrhea, nausea and vomiting.  Genitourinary: Negative for difficulty urinating.  Musculoskeletal: Positive for arthralgias, back pain and myalgias.  Neurological: Negative for headaches.  Psychiatric/Behavioral: Negative for sleep disturbance.    Objective:  BP (!) 181/93   Pulse 87   Temp 98 F (36.7 C) (Oral)   Ht _0  (1.549 m)   Wt 157 lb 6 oz (71.4 kg)   LMP 04/06/2011   BMI 29.74 kg/m   BP Readings from Last 3 Encounters:  06/21/18 (!) 181/93  03/15/18 (!) 191/99  01/05/18 125/72    Wt Readings from Last 3 Encounters:  06/21/18 157 lb 6 oz (71.4 kg)  03/15/18 147 lb 9.6 oz (67 kg)  01/05/18 142 lb 8 oz (64.6 kg)     Physical Exam Constitutional:      General: She is not in acute distress.    Appearance: She is well-developed.  HENT:     Head: Normocephalic and atraumatic.  Eyes:     Conjunctiva/sclera: Conjunctivae normal.     Pupils: Pupils are equal, round, and reactive to light.  Neck:     Musculoskeletal: Normal range of motion and neck supple.     Thyroid: No thyromegaly.  Cardiovascular:     Rate and Rhythm: Normal rate and regular rhythm.     Heart sounds: Normal heart sounds. No murmur.  Pulmonary:     Effort: Pulmonary effort is normal. No respiratory distress.     Breath sounds: Normal breath sounds. No wheezing or rales.  Abdominal:     General: Bowel sounds are normal. There is no distension.     Palpations: Abdomen is soft.     Tenderness: There is no abdominal tenderness.  Musculoskeletal: Normal range of  motion.  Lymphadenopathy:     Cervical: No cervical adenopathy.  Skin:    General: Skin is warm and dry.  Neurological:     Mental Status: She is alert and oriented to person, place, and time.  Psychiatric:        Behavior: Behavior normal.        Thought Content: Thought content normal.        Judgment: Judgment normal.       Assessment & Plan:   Grace Stokes was seen today for medical management of chronic issues.  Diagnoses and all orders for this visit:  Uncontrolled type 2 diabetes mellitus with complication, with long-term current use of insulin ([facility]) -     CBC with Differential/Platelet -     CMP14+EGFR -     Bayer DCA Hb A1c Waived  Essential hypertension  Uncomplicated opioid dependence ([place])  Mixed hyperlipidemia -     Lipid panel  Other orders -     Discontinue: amLODipine (NORVASC) 5 MG tablet; Take 1 tablet (5 mg total) by mouth daily. For blood pressure -     cyclobenzaprine (FLEXERIL) 10 MG tablet; Take 1 tablet (10 mg total) by mouth 2 (two) times daily as needed for muscle spasms. -     lisinopril (PRINIVIL,ZESTRIL) 10 MG tablet; Take 1 tablet (10 mg total) by mouth daily. For kidney protection -     metoprolol succinate (TOPROL-XL) 50 MG 24 hr tablet; Take 1 tablet (50 mg total) by mouth daily. For heart and blood pressure -     amLODipine (NORVASC) 10 MG tablet; Take 1 tablet (10 mg total) by mouth daily. For blood pressure -     traZODone (DESYREL) 100 MG tablet; Take 1 tablet (100 mg total) by mouth at bedtime. -     insulin NPH Human (NOVOLIN N RELION) 100 UNIT/ML injection; Inject 0.1 mLs (10 Units total) into the skin daily before breakfast AND 0.25 mLs (25 Units total) daily with supper.      I have discontinued Grace Stokes. Grace Stokes's amLODipine. I have also changed her amLODipine and insulin NPH Human. Additionally, I am having her start on traZODone. Lastly, I am having her maintain her TRUEplus Lancets 30G, Insulin Syringe-Needle U-100, cyclobenzaprine,  lisinopril, and metoprolol succinate.  Meds ordered this encounter  Medications  . DISCONTD: amLODipine (NORVASC) 5 MG tablet    Sig: Take 1 tablet (5 mg total) by mouth daily. For blood pressure    Dispense:  90 tablet    Refill:  1  . cyclobenzaprine (FLEXERIL) 10 MG tablet    Sig: Take 1 tablet (10 mg total) by mouth 2 (two) times daily as needed for muscle spasms.    Dispense:  60 tablet    Refill:  5  . lisinopril (PRINIVIL,ZESTRIL) 10 MG tablet    Sig: Take 1 tablet (10 mg  total) by mouth daily. For kidney protection    Dispense:  90 tablet    Refill:  1  . metoprolol succinate (TOPROL-XL) 50 MG 24 hr tablet    Sig: Take 1 tablet (50 mg total) by mouth daily. For heart and blood pressure    Dispense:  90 tablet    Refill:  1  . amLODipine (NORVASC) 10 MG tablet    Sig: Take 1 tablet (10 mg total) by mouth daily. For blood pressure    Dispense:  30 tablet    Refill:  5  . traZODone (DESYREL) 100 MG tablet    Sig: Take 1 tablet (100 mg total) by mouth at bedtime.    Dispense:  30 tablet    Refill:  5  . insulin NPH Human (NOVOLIN N RELION) 100 UNIT/ML injection    Sig: Inject 0.1 mLs (10 Units total) into the skin daily before breakfast AND 0.25 mLs (25 Units total) daily with supper.    Dispense:  20 mL    Refill:  11   Encouraged pt to continue efforts with diet. Increase insulin as noted.  Follow-up: Return in about 3 months (around 09/21/2018).  Claretta Fraise, M.D.

## 2018-06-22 ENCOUNTER — Telehealth: Payer: Self-pay | Admitting: *Deleted

## 2018-06-22 LAB — CMP14+EGFR
A/G RATIO: 0.7 — AB (ref 1.2–2.2)
ALT: 136 IU/L — AB (ref 0–32)
AST: 206 IU/L — AB (ref 0–40)
Albumin: 3.2 g/dL — ABNORMAL LOW (ref 3.8–4.8)
Alkaline Phosphatase: 121 IU/L — ABNORMAL HIGH (ref 39–117)
BILIRUBIN TOTAL: 1.1 mg/dL (ref 0.0–1.2)
BUN/Creatinine Ratio: 19 (ref 12–28)
BUN: 11 mg/dL (ref 8–27)
CALCIUM: 8.6 mg/dL — AB (ref 8.7–10.3)
CHLORIDE: 104 mmol/L (ref 96–106)
CO2: 25 mmol/L (ref 20–29)
Creatinine, Ser: 0.57 mg/dL (ref 0.57–1.00)
GFR calc Af Amer: 115 mL/min/{1.73_m2} (ref >59)
GFR calc non Af Amer: 100 mL/min/{1.73_m2} (ref >59)
Globulin, Total: 4.9 g/dL — ABNORMAL HIGH (ref 1.5–4.5)
Glucose: 177 mg/dL — ABNORMAL HIGH (ref 65–99)
POTASSIUM: 4.3 mmol/L (ref 3.5–5.2)
Sodium: 139 mmol/L (ref 134–144)
Total Protein: 8.1 g/dL (ref 6.0–8.5)

## 2018-06-22 LAB — CBC WITH DIFFERENTIAL/PLATELET
BASOS ABS: 0 10*3/uL (ref 0.0–0.2)
Basos: 1 %
EOS (ABSOLUTE): 0 10*3/uL (ref 0.0–0.4)
Eos: 1 %
Hematocrit: 36 % (ref 34.0–46.6)
Hemoglobin: 12.4 g/dL (ref 11.1–15.9)
Immature Grans (Abs): 0 10*3/uL (ref 0.0–0.1)
Immature Granulocytes: 1 %
LYMPHS: 37 %
Lymphocytes Absolute: 0.8 10*3/uL (ref 0.7–3.1)
MCH: 30.7 pg (ref 26.6–33.0)
MCHC: 34.4 g/dL (ref 31.5–35.7)
MCV: 89 fL (ref 79–97)
Monocytes Absolute: 0.3 10*3/uL (ref 0.1–0.9)
Monocytes: 14 %
NEUTROS ABS: 1 10*3/uL — AB (ref 1.4–7.0)
NEUTROS PCT: 46 %
PLATELETS: 61 10*3/uL — AB (ref 150–450)
RBC: 4.04 x10E6/uL (ref 3.77–5.28)
RDW: 13.1 % (ref 11.7–15.4)
WBC: 2.1 10*3/uL — AB (ref 3.4–10.8)

## 2018-06-22 LAB — LIPID PANEL
Chol/HDL Ratio: 2.6 ratio (ref 0.0–4.4)
Cholesterol, Total: 148 mg/dL (ref 100–199)
HDL: 56 mg/dL (ref >39)
LDL Calculated: 76 mg/dL (ref 0–99)
TRIGLYCERIDES: 79 mg/dL (ref 0–149)
VLDL Cholesterol Cal: 16 mg/dL (ref 5–40)

## 2018-06-22 NOTE — Telephone Encounter (Signed)
-----   Message from Claretta Fraise, MD sent at 06/22/2018  4:30 PM EDT ----- Sherre Poot, Your A1c for diabetes is slightly higher than last time.  Just work on diet as far as controlling and limiting her carbohydrate intake.  Also your white blood count and platelet counts are still low.  You may want to see your specialist for this.  However, it has been stable for the last year. Best Regards, Claretta Fraise, M.D.

## 2018-06-27 ENCOUNTER — Encounter: Payer: Self-pay | Admitting: Family Medicine

## 2018-06-27 MED ORDER — AMLODIPINE BESYLATE 10 MG PO TABS: 10.0000 mg | ORAL_TABLET | Freq: Every day | ORAL | 5 refills | Status: DC

## 2018-06-27 MED ORDER — INSULIN NPH (HUMAN) (ISOPHANE) 100 UNIT/ML ~~LOC~~ SUSP: SUBCUTANEOUS | 11 refills | Status: DC

## 2018-06-27 MED ORDER — TRAZODONE HCL 100 MG PO TABS: 100.0000 mg | ORAL_TABLET | Freq: Every day | ORAL | 5 refills | Status: DC

## 2018-07-27 ENCOUNTER — Ambulatory Visit: Payer: Medicare Other | Admitting: Family Medicine

## 2018-08-16 ENCOUNTER — Ambulatory Visit (INDEPENDENT_AMBULATORY_CARE_PROVIDER_SITE_OTHER): Payer: Medicare Other | Admitting: Family Medicine

## 2018-08-16 ENCOUNTER — Other Ambulatory Visit: Payer: Self-pay

## 2018-08-16 ENCOUNTER — Encounter: Payer: Self-pay | Admitting: Family Medicine

## 2018-08-16 DIAGNOSIS — E782 Mixed hyperlipidemia: Secondary | ICD-10-CM

## 2018-08-16 DIAGNOSIS — Z794 Long term (current) use of insulin: Secondary | ICD-10-CM | POA: Diagnosis not present

## 2018-08-16 DIAGNOSIS — I10 Essential (primary) hypertension: Secondary | ICD-10-CM

## 2018-08-16 DIAGNOSIS — E118 Type 2 diabetes mellitus with unspecified complications: Secondary | ICD-10-CM

## 2018-08-16 DIAGNOSIS — E1165 Type 2 diabetes mellitus with hyperglycemia: Secondary | ICD-10-CM

## 2018-08-16 DIAGNOSIS — IMO0002 Reserved for concepts with insufficient information to code with codable children: Secondary | ICD-10-CM

## 2018-08-16 MED ORDER — TRAZODONE HCL 100 MG PO TABS: 100.0000 mg | ORAL_TABLET | Freq: Every day | ORAL | 5 refills | Status: DC

## 2018-08-16 MED ORDER — INSULIN NPH (HUMAN) (ISOPHANE) 100 UNIT/ML ~~LOC~~ SUSP: SUBCUTANEOUS | 11 refills | Status: DC

## 2018-08-16 NOTE — Progress Notes (Signed)
No answer 11:07, LMOM    Subjective:    Patient ID: Grace Stokes, female    DOB: Jun 19, 1955, 63 y.o.   MRN: [id number]   HPI: Grace Stokes is a 62 y.o. female presenting for follow up of diabetes. Fasting 135-170. Postprandial running 230-250 before lunch. Running 150-170 2 hours after supper. Confirms taking 10 units in the morning. Can't get eye appt. Due to COVID crisis.  Also reports taking meds "religiously" for BP etc. Doesn't have a monitor for BP.Looking at getting one.  Not sleeping well. "Up and down" all night. Didn't ever get the trazodone. Wants to try.  Patient in for follow-up of elevated cholesterol. Doing well without complaints on current medication. Denies side effects of statin including myalgia and arthralgia and nausea.  Currently no chest pain, shortness of breath or other cardiovascular related symptoms noted.   Depression screen [facility] 2/9 06/21/2018 03/15/2018 01/05/2018 11/24/2017 10/08/2017  Decreased Interest 0 0 0 0 1  Down, Depressed, Hopeless 0 0 0 0 0  PHQ - 2 Score 0 0 0 0 1  Altered sleeping - - - - -  Tired, decreased energy - - - - -  Change in appetite - - - - -  Feeling bad or failure about yourself  - - - - -  Trouble concentrating - - - - -  Moving slowly or fidgety/restless - - - - -  PHQ-9 Score - - - - -     Relevant past medical, surgical, family and social history reviewed and updated as indicated.  Interim medical history since our last visit reviewed. Allergies and medications reviewed and updated.  ROS:  Review of Systems  Constitutional: Negative.   HENT: Negative for congestion.   Eyes: Negative for visual disturbance.  Respiratory: Negative for shortness of breath.   Cardiovascular: Negative for chest pain.  Gastrointestinal: Negative for abdominal pain, constipation, diarrhea, nausea and vomiting.  Genitourinary: Negative for difficulty urinating.  Musculoskeletal: Positive for arthralgias and back pain. Negative for  myalgias.  Neurological: Negative for headaches.  Psychiatric/Behavioral: Positive for sleep disturbance.     Social History   Tobacco Use  Smoking Status Former Smoker  . Types: Cigarettes  Smokeless Tobacco Never Used       Objective:     Wt Readings from Last 3 Encounters:  06/21/18 157 lb 6 oz (71.4 kg)  03/15/18 147 lb 9.6 oz (67 kg)  01/05/18 142 lb 8 oz (64.6 kg)     Exam deferred. Pt. Harboring due to COVID 19. Phone visit performed.   Assessment & Plan:   1. Uncontrolled type 2 diabetes mellitus with complication, with long-term current use of insulin ([place])   2. Essential hypertension   3. Mixed hyperlipidemia     Meds ordered this encounter  Medications  . insulin NPH Human (NOVOLIN N RELION) 100 UNIT/ML injection    Sig: Inject 0.14 mLs (14 Units total) into the skin daily before breakfast AND 0.28 mLs (28 Units total) daily with supper.    Dispense:  20 mL    Refill:  11  . traZODone (DESYREL) 100 MG tablet    Sig: Take 1 tablet (100 mg total) by mouth at bedtime.    Dispense:  30 tablet    Refill:  5    No orders of the defined types were placed in this encounter.     Diagnoses and all orders for this visit:  Uncontrolled type 2 diabetes mellitus with complication, with long-term current  use of insulin (HCC)  Essential hypertension  Mixed hyperlipidemia  Other orders -     insulin NPH Human (NOVOLIN N RELION) 100 UNIT/ML injection; Inject 0.14 mLs (14 Units total) into the skin daily before breakfast AND 0.28 mLs (28 Units total) daily with supper. -     traZODone (DESYREL) 100 MG tablet; Take 1 tablet (100 mg total) by mouth at bedtime.    Virtual Visit via telephone Note  I discussed the limitations, risks, security and privacy concerns of performing an evaluation and management service by telephone and the availability of in person appointments. The patient was identified with two identifiers. Pt.expressed understanding and agreed to  proceed. Pt. Is at home. Dr. Livia Snellen is in his office.  Follow Up Instructions:   I discussed the assessment and treatment plan with the patient. The patient was provided an opportunity to ask questions and all were answered. The patient agreed with the plan and demonstrated an understanding of the instructions.   The patient was advised to call back or seek an in-person evaluation if the symptoms worsen or if the condition fails to improve as anticipated.   Total minutes including chart review and phone contact time: 40   Follow up plan: Return in about 6 weeks (around 09/27/2018).  Claretta Fraise, MD [place]

## 2018-09-22 ENCOUNTER — Encounter: Payer: Self-pay | Admitting: Family Medicine

## 2018-09-22 ENCOUNTER — Other Ambulatory Visit: Payer: Self-pay

## 2018-09-22 ENCOUNTER — Ambulatory Visit (INDEPENDENT_AMBULATORY_CARE_PROVIDER_SITE_OTHER): Payer: Medicare Other | Admitting: Family Medicine

## 2018-09-22 DIAGNOSIS — E118 Type 2 diabetes mellitus with unspecified complications: Secondary | ICD-10-CM

## 2018-09-22 DIAGNOSIS — Z794 Long term (current) use of insulin: Secondary | ICD-10-CM

## 2018-09-22 DIAGNOSIS — R05 Cough: Secondary | ICD-10-CM | POA: Diagnosis not present

## 2018-09-22 DIAGNOSIS — G4701 Insomnia due to medical condition: Secondary | ICD-10-CM | POA: Diagnosis not present

## 2018-09-22 DIAGNOSIS — E1165 Type 2 diabetes mellitus with hyperglycemia: Secondary | ICD-10-CM

## 2018-09-22 DIAGNOSIS — R059 Cough, unspecified: Secondary | ICD-10-CM

## 2018-09-22 DIAGNOSIS — IMO0002 Reserved for concepts with insufficient information to code with codable children: Secondary | ICD-10-CM

## 2018-09-22 MED ORDER — INSULIN NPH (HUMAN) (ISOPHANE) 100 UNIT/ML ~~LOC~~ SUSP: SUBCUTANEOUS | 11 refills | Status: DC

## 2018-09-22 NOTE — Progress Notes (Signed)
Subjective:    Patient ID: Grace Stokes, female    DOB: 1955-10-16, 63 y.o.   MRN: [id number]   HPI: Grace Stokes is a 63 y.o. female presenting for diabetes check fasting 140 -160. Postprandial is 194- 287. No low glucose reactions. Walking is hard, due to back pain and scoliosis, but is trying to make sure she walks a lot.    Depression screen [facility] 2/9 06/21/2018 03/15/2018 01/05/2018 11/24/2017 10/08/2017  Decreased Interest 0 0 0 0 1  Down, Depressed, Hopeless 0 0 0 0 0  PHQ - 2 Score 0 0 0 0 1  Altered sleeping - - - - -  Tired, decreased energy - - - - -  Change in appetite - - - - -  Feeling bad or failure about yourself  - - - - -  Trouble concentrating - - - - -  Moving slowly or fidgety/restless - - - - -  PHQ-9 Score - - - - -     Relevant past medical, surgical, family and social history reviewed and updated as indicated.  Interim medical history since our last visit reviewed. Allergies and medications reviewed and updated.  ROS:  Review of Systems  Constitutional: Negative.   HENT: Negative for congestion.   Eyes: Negative for visual disturbance.  Respiratory: Negative for shortness of breath.   Cardiovascular: Negative for chest pain.  Gastrointestinal: Negative for abdominal pain, constipation, diarrhea, nausea and vomiting.  Genitourinary: Negative for difficulty urinating.  Musculoskeletal: Positive for arthralgias and back pain. Negative for myalgias.  Neurological: Negative for headaches.  Psychiatric/Behavioral: Positive for sleep disturbance (likely related to pain and opiate use. Better now using trazodone).     Social History   Tobacco Use  Smoking Status Former Smoker  . Types: Cigarettes  Smokeless Tobacco Never Used       Objective:     Wt Readings from Last 3 Encounters:  06/21/18 157 lb 6 oz (71.4 kg)  03/15/18 147 lb 9.6 oz (67 kg)  01/05/18 142 lb 8 oz (64.6 kg)     Exam deferred. Pt. Harboring due to COVID 19. Phone visit  performed.   Assessment & Plan:   1. Uncontrolled type 2 diabetes mellitus with complication, with long-term current use of insulin ([place])   2. Cough   3. Insomnia due to medical condition     Meds ordered this encounter  Medications  . insulin NPH Human (NOVOLIN N RELION) 100 UNIT/ML injection    Sig: Inject 0.18 mLs (18 Units total) into the skin daily before breakfast AND 0.32 mLs (32 Units total) daily with supper.    Dispense:  20 mL    Refill:  11    No orders of the defined types were placed in this encounter.     Diagnoses and all orders for this visit:  Uncontrolled type 2 diabetes mellitus with complication, with long-term current use of insulin (HCC)  Cough  Insomnia due to medical condition  Other orders -     insulin NPH Human (NOVOLIN N RELION) 100 UNIT/ML injection; Inject 0.18 mLs (18 Units total) into the skin daily before breakfast AND 0.32 mLs (32 Units total) daily with supper.    Virtual Visit via telephone Note  I discussed the limitations, risks, security and privacy concerns of performing an evaluation and management service by telephone and the availability of in person appointments. The patient was identified with two identifiers. Pt.expressed understanding and agreed to proceed. Pt. Is at  home. Dr. Livia Snellen is in his office.  Follow Up Instructions:   I discussed the assessment and treatment plan with the patient. The patient was provided an opportunity to ask questions and all were answered. The patient agreed with the plan and demonstrated an understanding of the instructions.   The patient was advised to call back or seek an in-person evaluation if the symptoms worsen or if the condition fails to improve as anticipated.   Total minutes including chart review and phone contact time: 20   Follow up plan: No follow-ups on file.  Claretta Fraise, MD [place]

## 2018-09-24 ENCOUNTER — Encounter: Payer: Self-pay | Admitting: Family Medicine

## 2018-12-22 ENCOUNTER — Emergency Department (HOSPITAL_COMMUNITY): Payer: Medicare Other

## 2018-12-22 ENCOUNTER — Inpatient Hospital Stay (HOSPITAL_COMMUNITY)
Admission: EM | Admit: 2018-12-22 | Discharge: 2018-12-25 | DRG: 083 | Disposition: A | Discharge status: Home / Self Care | Payer: Medicare Other | Attending: Internal Medicine | Admitting: Internal Medicine

## 2018-12-22 ENCOUNTER — Encounter (HOSPITAL_COMMUNITY): Payer: Self-pay | Admitting: Emergency Medicine

## 2018-12-22 ENCOUNTER — Other Ambulatory Visit: Payer: Self-pay

## 2018-12-22 DIAGNOSIS — Z79899 Other long term (current) drug therapy: Secondary | ICD-10-CM

## 2018-12-22 DIAGNOSIS — M25511 Pain in right shoulder: Secondary | ICD-10-CM | POA: Diagnosis not present

## 2018-12-22 DIAGNOSIS — G510 Bell's palsy: Secondary | ICD-10-CM | POA: Diagnosis present

## 2018-12-22 DIAGNOSIS — Z87891 Personal history of nicotine dependence: Secondary | ICD-10-CM

## 2018-12-22 DIAGNOSIS — Z66 Do not resuscitate: Secondary | ICD-10-CM | POA: Diagnosis present

## 2018-12-22 DIAGNOSIS — M48061 Spinal stenosis, lumbar region without neurogenic claudication: Secondary | ICD-10-CM | POA: Diagnosis present

## 2018-12-22 DIAGNOSIS — Z981 Arthrodesis status: Secondary | ICD-10-CM

## 2018-12-22 DIAGNOSIS — S199XXA Unspecified injury of neck, initial encounter: Secondary | ICD-10-CM | POA: Diagnosis not present

## 2018-12-22 DIAGNOSIS — I10 Essential (primary) hypertension: Secondary | ICD-10-CM | POA: Diagnosis present

## 2018-12-22 DIAGNOSIS — F1010 Alcohol abuse, uncomplicated: Secondary | ICD-10-CM | POA: Diagnosis present

## 2018-12-22 DIAGNOSIS — Z7151 Drug abuse counseling and surveillance of drug abuser: Secondary | ICD-10-CM

## 2018-12-22 DIAGNOSIS — B1910 Unspecified viral hepatitis B without hepatic coma: Secondary | ICD-10-CM

## 2018-12-22 DIAGNOSIS — E1165 Type 2 diabetes mellitus with hyperglycemia: Secondary | ICD-10-CM

## 2018-12-22 DIAGNOSIS — S065X0A Traumatic subdural hemorrhage without loss of consciousness, initial encounter: Secondary | ICD-10-CM | POA: Diagnosis not present

## 2018-12-22 DIAGNOSIS — E119 Type 2 diabetes mellitus without complications: Secondary | ICD-10-CM | POA: Diagnosis present

## 2018-12-22 DIAGNOSIS — S8992XA Unspecified injury of left lower leg, initial encounter: Secondary | ICD-10-CM | POA: Diagnosis not present

## 2018-12-22 DIAGNOSIS — M4120 Other idiopathic scoliosis, site unspecified: Secondary | ICD-10-CM | POA: Diagnosis present

## 2018-12-22 DIAGNOSIS — R419 Unspecified symptoms and signs involving cognitive functions and awareness: Secondary | ICD-10-CM

## 2018-12-22 DIAGNOSIS — S3992XA Unspecified injury of lower back, initial encounter: Secondary | ICD-10-CM | POA: Diagnosis not present

## 2018-12-22 DIAGNOSIS — Z03818 Encounter for observation for suspected exposure to other biological agents ruled out: Secondary | ICD-10-CM | POA: Diagnosis not present

## 2018-12-22 DIAGNOSIS — S4991XA Unspecified injury of right shoulder and upper arm, initial encounter: Secondary | ICD-10-CM | POA: Diagnosis not present

## 2018-12-22 DIAGNOSIS — Z794 Long term (current) use of insulin: Secondary | ICD-10-CM

## 2018-12-22 DIAGNOSIS — S0993XA Unspecified injury of face, initial encounter: Secondary | ICD-10-CM | POA: Diagnosis not present

## 2018-12-22 DIAGNOSIS — G4701 Insomnia due to medical condition: Secondary | ICD-10-CM | POA: Diagnosis present

## 2018-12-22 DIAGNOSIS — K7010 Alcoholic hepatitis without ascites: Secondary | ICD-10-CM | POA: Diagnosis present

## 2018-12-22 DIAGNOSIS — Z79891 Long term (current) use of opiate analgesic: Secondary | ICD-10-CM

## 2018-12-22 DIAGNOSIS — D61818 Other pancytopenia: Secondary | ICD-10-CM | POA: Diagnosis present

## 2018-12-22 DIAGNOSIS — G8929 Other chronic pain: Secondary | ICD-10-CM | POA: Diagnosis present

## 2018-12-22 DIAGNOSIS — Z20828 Contact with and (suspected) exposure to other viral communicable diseases: Secondary | ICD-10-CM | POA: Diagnosis present

## 2018-12-22 DIAGNOSIS — S065XAA Traumatic subdural hemorrhage with loss of consciousness status unknown, initial encounter: Secondary | ICD-10-CM | POA: Diagnosis present

## 2018-12-22 DIAGNOSIS — E876 Hypokalemia: Secondary | ICD-10-CM | POA: Diagnosis present

## 2018-12-22 DIAGNOSIS — R109 Unspecified abdominal pain: Secondary | ICD-10-CM | POA: Diagnosis not present

## 2018-12-22 DIAGNOSIS — S01111A Laceration without foreign body of right eyelid and periocular area, initial encounter: Secondary | ICD-10-CM | POA: Diagnosis not present

## 2018-12-22 DIAGNOSIS — F1110 Opioid abuse, uncomplicated: Secondary | ICD-10-CM | POA: Diagnosis present

## 2018-12-22 DIAGNOSIS — S065X9A Traumatic subdural hemorrhage with loss of consciousness of unspecified duration, initial encounter: Principal | ICD-10-CM | POA: Diagnosis present

## 2018-12-22 DIAGNOSIS — B181 Chronic viral hepatitis B without delta-agent: Secondary | ICD-10-CM | POA: Diagnosis present

## 2018-12-22 DIAGNOSIS — S3991XA Unspecified injury of abdomen, initial encounter: Secondary | ICD-10-CM | POA: Diagnosis not present

## 2018-12-22 DIAGNOSIS — F1910 Other psychoactive substance abuse, uncomplicated: Secondary | ICD-10-CM | POA: Diagnosis present

## 2018-12-22 DIAGNOSIS — S3993XA Unspecified injury of pelvis, initial encounter: Secondary | ICD-10-CM | POA: Diagnosis not present

## 2018-12-22 DIAGNOSIS — S299XXA Unspecified injury of thorax, initial encounter: Secondary | ICD-10-CM | POA: Diagnosis not present

## 2018-12-22 DIAGNOSIS — Z789 Other specified health status: Secondary | ICD-10-CM

## 2018-12-22 LAB — COMPREHENSIVE METABOLIC PANEL
ALT: 61 U/L — ABNORMAL HIGH (ref 0–44)
AST: 84 U/L — ABNORMAL HIGH (ref 15–41)
Albumin: 2.5 g/dL — ABNORMAL LOW (ref 3.5–5.0)
Alkaline Phosphatase: 78 U/L (ref 38–126)
Anion gap: 7 (ref 5–15)
BUN: 5 mg/dL — ABNORMAL LOW (ref 8–23)
CO2: 24 mmol/L (ref 22–32)
Calcium: 8.4 mg/dL — ABNORMAL LOW (ref 8.9–10.3)
Chloride: 103 mmol/L (ref 98–111)
Creatinine, Ser: 0.41 mg/dL — ABNORMAL LOW (ref 0.44–1.00)
GFR calc Af Amer: >60 mL/min (ref >60)
GFR calc non Af Amer: >60 mL/min (ref >60)
Glucose, Bld: 210 mg/dL — ABNORMAL HIGH (ref 70–99)
Potassium: 3.7 mmol/L (ref 3.5–5.1)
Sodium: 134 mmol/L — ABNORMAL LOW (ref 135–145)
Total Bilirubin: 2.1 mg/dL — ABNORMAL HIGH (ref 0.3–1.2)
Total Protein: 7.9 g/dL (ref 6.5–8.1)

## 2018-12-22 LAB — SARS CORONAVIRUS 2 BY RT PCR (HOSPITAL ORDER, PERFORMED IN ~~LOC~~ HOSPITAL LAB): SARS Coronavirus 2: NEGATIVE

## 2018-12-22 LAB — PROTIME-INR
INR: 1.3 — ABNORMAL HIGH (ref 0.8–1.2)
Prothrombin Time: 15.6 seconds — ABNORMAL HIGH (ref 11.4–15.2)

## 2018-12-22 LAB — CBC
HCT: 35.8 % — ABNORMAL LOW (ref 36.0–46.0)
Hemoglobin: 12.1 g/dL (ref 12.0–15.0)
MCH: 31.1 pg (ref 26.0–34.0)
MCHC: 33.8 g/dL (ref 30.0–36.0)
MCV: 92 fL (ref 80.0–100.0)
Platelets: 61 10*3/uL — ABNORMAL LOW (ref 150–400)
RBC: 3.89 MIL/uL (ref 3.87–5.11)
RDW: 13.8 % (ref 11.5–15.5)
WBC: 2.1 10*3/uL — ABNORMAL LOW (ref 4.0–10.5)
nRBC: 0 % (ref 0.0–0.2)

## 2018-12-22 LAB — SAMPLE TO BLOOD BANK

## 2018-12-22 LAB — ETHANOL: Alcohol, Ethyl (B): <10 mg/dL (ref <10)

## 2018-12-22 LAB — CDS SEROLOGY

## 2018-12-22 LAB — LACTIC ACID, PLASMA: Lactic Acid, Venous: 1.1 mmol/L (ref 0.5–1.9)

## 2018-12-22 MED ORDER — LEVETIRACETAM IN NACL 500 MG/100ML IV SOLN
500.0000 mg | Freq: Two times a day (BID) | INTRAVENOUS | Status: DC
  Administered 2018-12-23 – 2018-12-25 (×6): 500 mg via INTRAVENOUS
  Filled 2018-12-22 (×8): qty 100

## 2018-12-22 MED ORDER — SODIUM CHLORIDE 0.9% IV SOLUTION
Freq: Once | INTRAVENOUS | Status: AC
  Administered 2018-12-22: 22:00:00 via INTRAVENOUS

## 2018-12-22 MED ORDER — LIDOCAINE HCL (PF) 1 % IJ SOLN
10.0000 mL | Freq: Once | INTRAMUSCULAR | Status: AC
  Administered 2018-12-22: 10 mL via INTRADERMAL
  Filled 2018-12-22: qty 10

## 2018-12-22 MED ORDER — IOHEXOL 300 MG/ML  SOLN
100.0000 mL | Freq: Once | INTRAMUSCULAR | Status: AC | PRN
  Administered 2018-12-22: 100 mL via INTRAVENOUS

## 2018-12-22 MED ORDER — SODIUM CHLORIDE 0.9 % IV BOLUS
1000.0000 mL | Freq: Once | INTRAVENOUS | Status: AC
  Administered 2018-12-22: 1000 mL via INTRAVENOUS

## 2018-12-22 MED ORDER — BACITRACIN ZINC 500 UNIT/GM EX OINT
TOPICAL_OINTMENT | Freq: Once | CUTANEOUS | Status: AC
  Administered 2018-12-22: 23:00:00 via TOPICAL

## 2018-12-22 NOTE — Progress Notes (Signed)
Orthopedic Tech Progress Note Patient Details:  Grace Stokes 08/10/1955 [id number]  Patient ID: Grace Stokes, female   DOB: [birth year], 63 y.o.   MRN: [id number]   [name] 12/22/2018, 6:10 PMTrauma activation.
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
## 2018-12-23 ENCOUNTER — Inpatient Hospital Stay (HOSPITAL_COMMUNITY): Payer: Medicare Other

## 2018-12-23 DIAGNOSIS — D696 Thrombocytopenia, unspecified: Secondary | ICD-10-CM | POA: Insufficient documentation

## 2018-12-23 DIAGNOSIS — Z87891 Personal history of nicotine dependence: Secondary | ICD-10-CM | POA: Diagnosis not present

## 2018-12-23 DIAGNOSIS — B181 Chronic viral hepatitis B without delta-agent: Secondary | ICD-10-CM | POA: Diagnosis present

## 2018-12-23 DIAGNOSIS — G510 Bell's palsy: Secondary | ICD-10-CM | POA: Diagnosis present

## 2018-12-23 DIAGNOSIS — S069X9A Unspecified intracranial injury with loss of consciousness of unspecified duration, initial encounter: Secondary | ICD-10-CM

## 2018-12-23 DIAGNOSIS — Z7151 Drug abuse counseling and surveillance of drug abuser: Secondary | ICD-10-CM | POA: Diagnosis not present

## 2018-12-23 DIAGNOSIS — Z79891 Long term (current) use of opiate analgesic: Secondary | ICD-10-CM | POA: Diagnosis not present

## 2018-12-23 DIAGNOSIS — Z794 Long term (current) use of insulin: Secondary | ICD-10-CM | POA: Diagnosis not present

## 2018-12-23 DIAGNOSIS — I10 Essential (primary) hypertension: Secondary | ICD-10-CM | POA: Diagnosis present

## 2018-12-23 DIAGNOSIS — E876 Hypokalemia: Secondary | ICD-10-CM | POA: Diagnosis present

## 2018-12-23 DIAGNOSIS — F1010 Alcohol abuse, uncomplicated: Secondary | ICD-10-CM | POA: Diagnosis present

## 2018-12-23 DIAGNOSIS — S065X9A Traumatic subdural hemorrhage with loss of consciousness of unspecified duration, initial encounter: Secondary | ICD-10-CM | POA: Diagnosis not present

## 2018-12-23 DIAGNOSIS — Z79899 Other long term (current) drug therapy: Secondary | ICD-10-CM | POA: Diagnosis not present

## 2018-12-23 DIAGNOSIS — S01111A Laceration without foreign body of right eyelid and periocular area, initial encounter: Secondary | ICD-10-CM | POA: Diagnosis present

## 2018-12-23 DIAGNOSIS — I361 Nonrheumatic tricuspid (valve) insufficiency: Secondary | ICD-10-CM | POA: Diagnosis not present

## 2018-12-23 DIAGNOSIS — S065XAA Traumatic subdural hemorrhage with loss of consciousness status unknown, initial encounter: Secondary | ICD-10-CM | POA: Diagnosis present

## 2018-12-23 DIAGNOSIS — F1910 Other psychoactive substance abuse, uncomplicated: Secondary | ICD-10-CM | POA: Diagnosis present

## 2018-12-23 DIAGNOSIS — Z981 Arthrodesis status: Secondary | ICD-10-CM | POA: Diagnosis not present

## 2018-12-23 DIAGNOSIS — R740 Nonspecific elevation of levels of transaminase and lactic acid dehydrogenase [LDH]: Secondary | ICD-10-CM | POA: Diagnosis not present

## 2018-12-23 DIAGNOSIS — D61818 Other pancytopenia: Secondary | ICD-10-CM | POA: Diagnosis present

## 2018-12-23 DIAGNOSIS — M4120 Other idiopathic scoliosis, site unspecified: Secondary | ICD-10-CM | POA: Diagnosis present

## 2018-12-23 DIAGNOSIS — M48061 Spinal stenosis, lumbar region without neurogenic claudication: Secondary | ICD-10-CM | POA: Diagnosis present

## 2018-12-23 DIAGNOSIS — F1110 Opioid abuse, uncomplicated: Secondary | ICD-10-CM | POA: Diagnosis present

## 2018-12-23 DIAGNOSIS — S065X0A Traumatic subdural hemorrhage without loss of consciousness, initial encounter: Secondary | ICD-10-CM | POA: Diagnosis not present

## 2018-12-23 DIAGNOSIS — R7401 Elevation of levels of liver transaminase levels: Secondary | ICD-10-CM | POA: Insufficient documentation

## 2018-12-23 DIAGNOSIS — E119 Type 2 diabetes mellitus without complications: Secondary | ICD-10-CM

## 2018-12-23 DIAGNOSIS — S0990XA Unspecified injury of head, initial encounter: Secondary | ICD-10-CM | POA: Diagnosis not present

## 2018-12-23 DIAGNOSIS — Z20828 Contact with and (suspected) exposure to other viral communicable diseases: Secondary | ICD-10-CM | POA: Diagnosis present

## 2018-12-23 DIAGNOSIS — Z66 Do not resuscitate: Secondary | ICD-10-CM | POA: Diagnosis present

## 2018-12-23 DIAGNOSIS — G4701 Insomnia due to medical condition: Secondary | ICD-10-CM | POA: Diagnosis present

## 2018-12-23 DIAGNOSIS — K7010 Alcoholic hepatitis without ascites: Secondary | ICD-10-CM | POA: Diagnosis present

## 2018-12-23 DIAGNOSIS — G8929 Other chronic pain: Secondary | ICD-10-CM | POA: Diagnosis present

## 2018-12-23 LAB — CBC
HCT: 29.6 % — ABNORMAL LOW (ref 36.0–46.0)
Hemoglobin: 10.1 g/dL — ABNORMAL LOW (ref 12.0–15.0)
MCH: 31.4 pg (ref 26.0–34.0)
MCHC: 34.1 g/dL (ref 30.0–36.0)
MCV: 91.9 fL (ref 80.0–100.0)
Platelets: 64 10*3/uL — ABNORMAL LOW (ref 150–400)
RBC: 3.22 MIL/uL — ABNORMAL LOW (ref 3.87–5.11)
RDW: 13.9 % (ref 11.5–15.5)
WBC: 1.8 10*3/uL — ABNORMAL LOW (ref 4.0–10.5)
nRBC: 0 % (ref 0.0–0.2)

## 2018-12-23 LAB — PREPARE PLATELET PHERESIS: Unit division: 0

## 2018-12-23 LAB — POCT I-STAT, CHEM 8
BUN: 7 mg/dL — ABNORMAL LOW (ref 8–23)
Calcium, Ion: 1.12 mmol/L — ABNORMAL LOW (ref 1.15–1.40)
Chloride: 100 mmol/L (ref 98–111)
Creatinine, Ser: 0.3 mg/dL — ABNORMAL LOW (ref 0.44–1.00)
Glucose, Bld: 211 mg/dL — ABNORMAL HIGH (ref 70–99)
HCT: 35 % — ABNORMAL LOW (ref 36.0–46.0)
Hemoglobin: 11.9 g/dL — ABNORMAL LOW (ref 12.0–15.0)
Potassium: 3.6 mmol/L (ref 3.5–5.1)
Sodium: 138 mmol/L (ref 135–145)
TCO2: 29 mmol/L (ref 22–32)

## 2018-12-23 LAB — COMPREHENSIVE METABOLIC PANEL
ALT: 50 U/L — ABNORMAL HIGH (ref 0–44)
AST: 64 U/L — ABNORMAL HIGH (ref 15–41)
Albumin: 2.3 g/dL — ABNORMAL LOW (ref 3.5–5.0)
Alkaline Phosphatase: 56 U/L (ref 38–126)
Anion gap: 5 (ref 5–15)
BUN: 7 mg/dL — ABNORMAL LOW (ref 8–23)
CO2: 28 mmol/L (ref 22–32)
Calcium: 8.4 mg/dL — ABNORMAL LOW (ref 8.9–10.3)
Chloride: 106 mmol/L (ref 98–111)
Creatinine, Ser: 0.43 mg/dL — ABNORMAL LOW (ref 0.44–1.00)
GFR calc Af Amer: >60 mL/min (ref >60)
GFR calc non Af Amer: >60 mL/min (ref >60)
Glucose, Bld: 129 mg/dL — ABNORMAL HIGH (ref 70–99)
Potassium: 3 mmol/L — ABNORMAL LOW (ref 3.5–5.1)
Sodium: 139 mmol/L (ref 135–145)
Total Bilirubin: 1.8 mg/dL — ABNORMAL HIGH (ref 0.3–1.2)
Total Protein: 7 g/dL (ref 6.5–8.1)

## 2018-12-23 LAB — URINALYSIS, ROUTINE W REFLEX MICROSCOPIC
Bilirubin Urine: NEGATIVE
Glucose, UA: 150 mg/dL — AB
Hgb urine dipstick: NEGATIVE
Ketones, ur: 5 mg/dL — AB
Leukocytes,Ua: NEGATIVE
Nitrite: NEGATIVE
Protein, ur: NEGATIVE mg/dL
Specific Gravity, Urine: >1.046 — ABNORMAL HIGH (ref 1.005–1.030)
pH: 7 (ref 5.0–8.0)

## 2018-12-23 LAB — GLUCOSE, CAPILLARY
Glucose-Capillary: 123 mg/dL — ABNORMAL HIGH (ref 70–99)
Glucose-Capillary: 182 mg/dL — ABNORMAL HIGH (ref 70–99)
Glucose-Capillary: 241 mg/dL — ABNORMAL HIGH (ref 70–99)
Glucose-Capillary: 115 mg/dL — ABNORMAL HIGH (ref 70–99)

## 2018-12-23 LAB — RAPID URINE DRUG SCREEN, HOSP PERFORMED
Amphetamines: NOT DETECTED
Barbiturates: NOT DETECTED
Benzodiazepines: NOT DETECTED
Cocaine: NOT DETECTED
Opiates: POSITIVE — AB
Tetrahydrocannabinol: NOT DETECTED

## 2018-12-23 LAB — BPAM PLATELET PHERESIS
Blood Product Expiration Date: 202009102359
ISSUE DATE / TIME: 202009102206
Unit Type and Rh: 7300

## 2018-12-23 LAB — TYPE AND SCREEN
ABO/RH(D): O POS
Antibody Screen: NEGATIVE

## 2018-12-23 LAB — ABO/RH: ABO/RH(D): O POS

## 2018-12-23 LAB — HIV ANTIBODY (ROUTINE TESTING W REFLEX): HIV Screen 4th Generation wRfx: NONREACTIVE

## 2018-12-23 MED ORDER — INSULIN GLARGINE 100 UNIT/ML ~~LOC~~ SOLN
30.0000 [IU] | Freq: Every day | SUBCUTANEOUS | Status: DC
  Administered 2018-12-23 – 2018-12-24 (×2): 30 [IU] via SUBCUTANEOUS
  Filled 2018-12-23 (×3): qty 0.3

## 2018-12-23 MED ORDER — METOPROLOL SUCCINATE ER 25 MG PO TB24
50.0000 mg | ORAL_TABLET | Freq: Every day | ORAL | Status: DC
  Administered 2018-12-23 – 2018-12-25 (×3): 50 mg via ORAL
  Filled 2018-12-23 (×3): qty 2

## 2018-12-23 MED ORDER — POTASSIUM CHLORIDE CRYS ER 20 MEQ PO TBCR
40.0000 meq | EXTENDED_RELEASE_TABLET | ORAL | Status: AC
  Administered 2018-12-23 (×3): 40 meq via ORAL
  Filled 2018-12-23 (×3): qty 2

## 2018-12-23 MED ORDER — LISINOPRIL 10 MG PO TABS
10.0000 mg | ORAL_TABLET | Freq: Every day | ORAL | Status: DC
  Administered 2018-12-23 – 2018-12-25 (×3): 10 mg via ORAL
  Filled 2018-12-23 (×3): qty 1

## 2018-12-23 MED ORDER — INSULIN ASPART 100 UNIT/ML ~~LOC~~ SOLN
0.0000 [IU] | Freq: Three times a day (TID) | SUBCUTANEOUS | Status: DC
  Administered 2018-12-23: 2 [IU] via SUBCUTANEOUS
  Administered 2018-12-23: 3 [IU] via SUBCUTANEOUS
  Administered 2018-12-23 – 2018-12-24 (×2): 5 [IU] via SUBCUTANEOUS
  Administered 2018-12-24: 3 [IU] via SUBCUTANEOUS
  Administered 2018-12-24 – 2018-12-25 (×2): 2 [IU] via SUBCUTANEOUS
  Administered 2018-12-25: 5 [IU] via SUBCUTANEOUS

## 2018-12-23 MED ORDER — AMLODIPINE BESYLATE 10 MG PO TABS
10.0000 mg | ORAL_TABLET | Freq: Every day | ORAL | Status: DC
  Administered 2018-12-23 – 2018-12-25 (×3): 10 mg via ORAL
  Filled 2018-12-23 (×3): qty 1
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
## 2018-12-23 NOTE — ED Notes (Signed)
Attempted report x1. 
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
## 2018-12-23 NOTE — Progress Notes (Addendum)
  NEUROSURGERY PROGRESS NOTE   No issues overnight.  Complains of HA this am Denies changes in vision, N/V, N/T/W  EXAM:  BP (!) 119/59 (BP Location: Left Arm)   Pulse 80   Temp (!) 97.1 F (36.2 C) (Oral)   Resp 16   Ht 5' (1.524 m)   Wt 68 kg   LMP 04/06/2011   SpO2 96%   BMI 29.28 kg/m   Awake, alert, oriented  Speech fluent, appropriate  CN grossly intact, right periorbital swelling 5/5 BUE/BLE   PLAN Stable neurologically Plan for repeat head CT later today. If stable, cleared for d/c from NS perspective.   Addendum Repeat head CT stable Cleared for d/c Complete 7 day course of keprpa for seizure prophylaxis F/U prn Please call for any concerns

## 2018-12-23 NOTE — H&P (Addendum)
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 12/22/2018 20:17    EKG: no EKG in chart  Assessment/Plan  Subdural hematoma - Neurosurgery has evaluated - FFP/platelets order by neuro for thrombocytopenia. Keep platelet >50K given bleed. - Neuro checks q2hr - Keppra x 7 day for seizure prophylaxis per neuro  Syncope/LOC - Further workup for syncope. Likely due to substance abuse - keep on telemetry - obtain EKG - low suspicion for seizure activity. No EEG at this time - UDS pending  History of polysubstance abuse - Last heroin use 2 days prior to admission.  Reports Subutex use yesterday.  Hypertension -Continue amlodipine, lisinopril, metoprolol  Diabetes -Patient normally takes NPH 18 units before breakfast and 32 units at nighttime. - 30 units of Lantus qHS. Moderate SSI  Transaminitis/hyperbilirubinemia - Could be secondary to IV heroin use -Could consider hepatitis panel or right upper quadrant ultrasound pending morning CMP     DVT prophylaxis: none  Code Status: Full  Family Communication: No family at bedside  Disposition Plan: Home Consults called: Neurosurgery Admission status: Inpatient requiring at  least 2 midnights given subdural hematoma and the need for close monitoring.  Orene Desanctis DO [facility]   If 7PM-7AM, please contact night-coverage [email or web address] Password [email or web address]  12/23/2018, 3:11 AM

## 2018-12-24 ENCOUNTER — Inpatient Hospital Stay (HOSPITAL_COMMUNITY): Payer: Medicare Other

## 2018-12-24 DIAGNOSIS — I361 Nonrheumatic tricuspid (valve) insufficiency: Secondary | ICD-10-CM

## 2018-12-24 DIAGNOSIS — F1110 Opioid abuse, uncomplicated: Secondary | ICD-10-CM

## 2018-12-24 DIAGNOSIS — E876 Hypokalemia: Secondary | ICD-10-CM

## 2018-12-24 LAB — HEMOGLOBIN A1C
Hgb A1c MFr Bld: 9.9 % — ABNORMAL HIGH (ref 4.8–5.6)
Mean Plasma Glucose: 237 mg/dL

## 2018-12-24 LAB — PREPARE FRESH FROZEN PLASMA
Unit division: 0
Unit division: 0

## 2018-12-24 LAB — BPAM FFP
Blood Product Expiration Date: 202009132359
Blood Product Expiration Date: 202009132359
ISSUE DATE / TIME: 202009110026
ISSUE DATE / TIME: 202009110241
Unit Type and Rh: 6200
Unit Type and Rh: 6200

## 2018-12-24 LAB — GLUCOSE, CAPILLARY
Glucose-Capillary: 159 mg/dL — ABNORMAL HIGH (ref 70–99)
Glucose-Capillary: 147 mg/dL — ABNORMAL HIGH (ref 70–99)
Glucose-Capillary: 218 mg/dL — ABNORMAL HIGH (ref 70–99)
Glucose-Capillary: 207 mg/dL — ABNORMAL HIGH (ref 70–99)

## 2018-12-24 LAB — ECHOCARDIOGRAM COMPLETE
Height: 60 in
Weight: 2398.6 oz

## 2018-12-24 LAB — HEPATITIS PANEL, ACUTE
HCV Ab: 0.2 s/co ratio (ref 0.0–0.9)
Hep A IgM: NEGATIVE
Hep B C IgM: NEGATIVE
Hepatitis B Surface Ag: POSITIVE — AB

## 2018-12-24 LAB — POTASSIUM: Potassium: 3.9 mmol/L (ref 3.5–5.1)

## 2018-12-24 MED ORDER — CLONIDINE HCL 0.1 MG PO TABS
0.1000 mg | ORAL_TABLET | ORAL | Status: DC | PRN
  Administered 2018-12-24 – 2018-12-25 (×2): 0.1 mg via ORAL
  Filled 2018-12-24 (×2): qty 1

## 2018-12-24 MED ORDER — POTASSIUM CHLORIDE 20 MEQ PO PACK: 40.0000 meq | PACK | Freq: Once | ORAL | Status: DC
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
## 2018-12-24 NOTE — Progress Notes (Signed)
  Echocardiogram 2D Echocardiogram has been performed.  Grace Stokes 12/24/2018, 3:30 PM

## 2018-12-25 DIAGNOSIS — B1910 Unspecified viral hepatitis B without hepatic coma: Secondary | ICD-10-CM

## 2018-12-25 DIAGNOSIS — B181 Chronic viral hepatitis B without delta-agent: Secondary | ICD-10-CM

## 2018-12-25 DIAGNOSIS — I10 Essential (primary) hypertension: Secondary | ICD-10-CM

## 2018-12-25 DIAGNOSIS — R419 Unspecified symptoms and signs involving cognitive functions and awareness: Secondary | ICD-10-CM

## 2018-12-25 LAB — BASIC METABOLIC PANEL
Anion gap: 6 (ref 5–15)
BUN: 7 mg/dL — ABNORMAL LOW (ref 8–23)
CO2: 22 mmol/L (ref 22–32)
Calcium: 8.3 mg/dL — ABNORMAL LOW (ref 8.9–10.3)
Chloride: 110 mmol/L (ref 98–111)
Creatinine, Ser: 0.44 mg/dL (ref 0.44–1.00)
GFR calc Af Amer: >60 mL/min (ref >60)
GFR calc non Af Amer: >60 mL/min (ref >60)
Glucose, Bld: 149 mg/dL — ABNORMAL HIGH (ref 70–99)
Potassium: 3.3 mmol/L — ABNORMAL LOW (ref 3.5–5.1)
Sodium: 138 mmol/L (ref 135–145)

## 2018-12-25 LAB — GLUCOSE, CAPILLARY
Glucose-Capillary: 148 mg/dL — ABNORMAL HIGH (ref 70–99)
Glucose-Capillary: 225 mg/dL — ABNORMAL HIGH (ref 70–99)

## 2018-12-25 LAB — MAGNESIUM: Magnesium: 1.7 mg/dL (ref 1.7–2.4)

## 2018-12-25 MED ORDER — MAGNESIUM SULFATE 2 GM/50ML IV SOLN
2.0000 g | Freq: Once | INTRAVENOUS | Status: AC
  Administered 2018-12-25: 2 g via INTRAVENOUS
  Filled 2018-12-25: qty 50

## 2018-12-25 MED ORDER — GABAPENTIN 100 MG PO CAPS: 100.0000 mg | ORAL_CAPSULE | Freq: Two times a day (BID) | ORAL | 0 refills | Status: DC

## 2018-12-25 MED ORDER — LEVETIRACETAM 500 MG PO TABS: 500.0000 mg | ORAL_TABLET | Freq: Two times a day (BID) | ORAL | 0 refills | Status: DC

## 2018-12-25 MED ORDER — POTASSIUM CHLORIDE 20 MEQ PO PACK
40.0000 meq | PACK | Freq: Once | ORAL | Status: AC
  Administered 2018-12-25: 40 meq via ORAL
  Filled 2018-12-25: qty 2

## 2018-12-25 MED ORDER — CLONIDINE HCL 0.1 MG PO TABS: 0.0500 mg | ORAL_TABLET | Freq: Two times a day (BID) | ORAL | 0 refills | Status: DC | PRN
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
## 2018-12-25 NOTE — Progress Notes (Signed)
Pt. And spouse educated on discharge instructions, questions answered. Patient belongings returned (clothes, purse, phone, Games developer). Patient transport to home via spouse

## 2019-01-09 ENCOUNTER — Other Ambulatory Visit: Payer: Self-pay

## 2019-01-10 ENCOUNTER — Encounter: Payer: Self-pay | Admitting: Family Medicine

## 2019-01-10 ENCOUNTER — Ambulatory Visit (INDEPENDENT_AMBULATORY_CARE_PROVIDER_SITE_OTHER): Payer: Medicare Other | Admitting: Family Medicine

## 2019-01-10 VITALS — BP 138/77 | HR 79 | Temp 98.9°F | Resp 16 | Ht 60.0 in | Wt 141.0 lb

## 2019-01-10 DIAGNOSIS — E119 Type 2 diabetes mellitus without complications: Secondary | ICD-10-CM

## 2019-01-10 DIAGNOSIS — S01111A Laceration without foreign body of right eyelid and periocular area, initial encounter: Secondary | ICD-10-CM

## 2019-01-10 DIAGNOSIS — F1110 Opioid abuse, uncomplicated: Secondary | ICD-10-CM

## 2019-01-10 DIAGNOSIS — S8012XA Contusion of left lower leg, initial encounter: Secondary | ICD-10-CM | POA: Diagnosis not present

## 2019-01-10 DIAGNOSIS — Z794 Long term (current) use of insulin: Secondary | ICD-10-CM

## 2019-01-10 LAB — BAYER DCA HB A1C WAIVED: HB A1C (BAYER DCA - WAIVED): 9.7 % — ABNORMAL HIGH (ref <7.0)

## 2019-01-10 MED ORDER — BLOOD GLUCOSE MONITOR KIT: PACK | 0 refills | Status: DC
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
## 2019-01-11 LAB — CMP14+EGFR
ALT: 75 IU/L — ABNORMAL HIGH (ref 0–32)
AST: 67 IU/L — ABNORMAL HIGH (ref 0–40)
Albumin/Globulin Ratio: 0.7 — ABNORMAL LOW (ref 1.2–2.2)
Albumin: 3.3 g/dL — ABNORMAL LOW (ref 3.8–4.8)
Alkaline Phosphatase: 209 IU/L — ABNORMAL HIGH (ref 39–117)
BUN/Creatinine Ratio: 13 (ref 12–28)
BUN: 8 mg/dL (ref 8–27)
Bilirubin Total: 1.4 mg/dL — ABNORMAL HIGH (ref 0.0–1.2)
CO2: 21 mmol/L (ref 20–29)
Calcium: 9.1 mg/dL (ref 8.7–10.3)
Chloride: 99 mmol/L (ref 96–106)
Creatinine, Ser: 0.61 mg/dL (ref 0.57–1.00)
GFR calc Af Amer: 112 mL/min/{1.73_m2} (ref >59)
GFR calc non Af Amer: 97 mL/min/{1.73_m2} (ref >59)
Globulin, Total: 5 g/dL — ABNORMAL HIGH (ref 1.5–4.5)
Glucose: 331 mg/dL — ABNORMAL HIGH (ref 65–99)
Potassium: 3.8 mmol/L (ref 3.5–5.2)
Sodium: 133 mmol/L — ABNORMAL LOW (ref 134–144)
Total Protein: 8.3 g/dL (ref 6.0–8.5)

## 2019-01-11 LAB — LIPID PANEL
Chol/HDL Ratio: 2.5 ratio (ref 0.0–4.4)
Cholesterol, Total: 188 mg/dL (ref 100–199)
HDL: 75 mg/dL (ref >39)
LDL Chol Calc (NIH): 97 mg/dL (ref 0–99)
Triglycerides: 90 mg/dL (ref 0–149)
VLDL Cholesterol Cal: 16 mg/dL (ref 5–40)

## 2019-01-20 ENCOUNTER — Ambulatory Visit: Payer: Medicare Other | Admitting: Licensed Clinical Social Worker

## 2019-01-20 DIAGNOSIS — Z794 Long term (current) use of insulin: Secondary | ICD-10-CM

## 2019-01-20 DIAGNOSIS — I10 Essential (primary) hypertension: Secondary | ICD-10-CM

## 2019-01-20 DIAGNOSIS — F1110 Opioid abuse, uncomplicated: Secondary | ICD-10-CM

## 2019-01-20 DIAGNOSIS — E782 Mixed hyperlipidemia: Secondary | ICD-10-CM

## 2019-01-20 DIAGNOSIS — E119 Type 2 diabetes mellitus without complications: Secondary | ICD-10-CM

## 2019-01-20 NOTE — Chronic Care Management (AMB) (Signed)
  Care Management Note   Grace Stokes is a 63 y.o. year old female who is a primary care patient of Stacks, Grace Gash, MD. The CM team was consulted for assistance with chronic disease management and care coordination.   I reached out to Grace Stokes , spouse of client, by phone today. LCSW talked with Grace Stokes via phone today. LCSW called client on cell phone and left client message on cell phone requesting she return call to LCSW at [phone]. Marland Kitchen  Review of patient status, including review of consultants reports, relevant laboratory and other test results, and collaboration with appropriate care team members and the patient's provider was performed as part of comprehensive patient evaluation and provision of chronic care management services.   Medications    amLODipine (NORVASC) 10 MG tablet    blood glucose meter kit and supplies KIT    cloNIDine (CATAPRES) 0.1 MG tablet(Expired)    cyclobenzaprine (FLEXERIL) 10 MG tablet    gabapentin (NEURONTIN) 100 MG capsule    insulin NPH Human (NOVOLIN N RELION) 100 UNIT/ML injection    Insulin Syringe-Needle U-100 (SAFETY-GLIDE 0.3CC SYR 29GX1/2) 29G X 1/2" 0.3 ML MISC    levETIRAcetam (KEPPRA) 500 MG tablet(Expired)    lisinopril (PRINIVIL,ZESTRIL) 10 MG tablet    metoprolol succinate (TOPROL-XL) 50 MG 24 hr tablet    traZODone (DESYREL) 100 MG tablet    TRUEPLUS LANCETS 30G MISC     Follow Up Plan: LCSW to call client or spouse of client in next 3 weeks to talk with client about CCM program services  Grace Stokes.Grace Stokes MSW, LCSW Licensed Clinical Social Worker [facility] Care [facility] [phone]

## 2019-01-20 NOTE — Patient Instructions (Signed)
Licensed Clinical Water engineer provided: No  I reached out to Wellman , spouse of client, by phone today. LCSW talked with [name] via phone today. LCSW called client on cell phone and left client message on cell phone requesting she return call to LCSW at [phone].    The patient/spouse, Grace Stokes verbalized understanding of instructions provided today and declined a print copy of patient instruction materials.   Follow up plan: LCSW to call client or spouse of client in next 3 weeks to talk with client about CCM program services  Norva Riffle.Irini Leet MSW, LCSW Licensed Clinical Social Worker [facility] [phone]

## 2019-02-03 ENCOUNTER — Emergency Department (HOSPITAL_COMMUNITY): Payer: Medicare Other

## 2019-02-03 ENCOUNTER — Other Ambulatory Visit: Payer: Self-pay

## 2019-02-03 ENCOUNTER — Encounter (HOSPITAL_COMMUNITY): Payer: Self-pay

## 2019-02-03 ENCOUNTER — Inpatient Hospital Stay (HOSPITAL_COMMUNITY)
Admission: EM | Admit: 2019-02-03 | Discharge: 2019-02-06 | DRG: 872 | Disposition: A | Discharge status: Home / Self Care | Payer: Medicare Other | Attending: Family Medicine | Admitting: Family Medicine

## 2019-02-03 DIAGNOSIS — G894 Chronic pain syndrome: Secondary | ICD-10-CM | POA: Diagnosis present

## 2019-02-03 DIAGNOSIS — R509 Fever, unspecified: Secondary | ICD-10-CM | POA: Diagnosis not present

## 2019-02-03 DIAGNOSIS — M5489 Other dorsalgia: Secondary | ICD-10-CM | POA: Diagnosis not present

## 2019-02-03 DIAGNOSIS — K8020 Calculus of gallbladder without cholecystitis without obstruction: Secondary | ICD-10-CM | POA: Diagnosis present

## 2019-02-03 DIAGNOSIS — R6520 Severe sepsis without septic shock: Secondary | ICD-10-CM

## 2019-02-03 DIAGNOSIS — Z87891 Personal history of nicotine dependence: Secondary | ICD-10-CM

## 2019-02-03 DIAGNOSIS — R52 Pain, unspecified: Secondary | ICD-10-CM | POA: Diagnosis not present

## 2019-02-03 DIAGNOSIS — M546 Pain in thoracic spine: Secondary | ICD-10-CM | POA: Diagnosis not present

## 2019-02-03 DIAGNOSIS — B181 Chronic viral hepatitis B without delta-agent: Secondary | ICD-10-CM | POA: Diagnosis present

## 2019-02-03 DIAGNOSIS — Z20828 Contact with and (suspected) exposure to other viral communicable diseases: Secondary | ICD-10-CM | POA: Diagnosis not present

## 2019-02-03 DIAGNOSIS — B1910 Unspecified viral hepatitis B without hepatic coma: Secondary | ICD-10-CM | POA: Diagnosis present

## 2019-02-03 DIAGNOSIS — M4712 Other spondylosis with myelopathy, cervical region: Secondary | ICD-10-CM | POA: Diagnosis present

## 2019-02-03 DIAGNOSIS — R7989 Other specified abnormal findings of blood chemistry: Secondary | ICD-10-CM | POA: Diagnosis present

## 2019-02-03 DIAGNOSIS — E876 Hypokalemia: Secondary | ICD-10-CM | POA: Diagnosis present

## 2019-02-03 DIAGNOSIS — R5381 Other malaise: Secondary | ICD-10-CM | POA: Diagnosis not present

## 2019-02-03 DIAGNOSIS — M545 Low back pain: Secondary | ICD-10-CM | POA: Diagnosis not present

## 2019-02-03 DIAGNOSIS — A419 Sepsis, unspecified organism: Secondary | ICD-10-CM

## 2019-02-03 DIAGNOSIS — K7460 Unspecified cirrhosis of liver: Secondary | ICD-10-CM | POA: Diagnosis not present

## 2019-02-03 DIAGNOSIS — R7881 Bacteremia: Principal | ICD-10-CM | POA: Diagnosis present

## 2019-02-03 DIAGNOSIS — M1990 Unspecified osteoarthritis, unspecified site: Secondary | ICD-10-CM | POA: Diagnosis present

## 2019-02-03 DIAGNOSIS — F1110 Opioid abuse, uncomplicated: Secondary | ICD-10-CM | POA: Diagnosis not present

## 2019-02-03 DIAGNOSIS — Z794 Long term (current) use of insulin: Secondary | ICD-10-CM

## 2019-02-03 DIAGNOSIS — E1165 Type 2 diabetes mellitus with hyperglycemia: Secondary | ICD-10-CM | POA: Diagnosis present

## 2019-02-03 DIAGNOSIS — R7401 Elevation of levels of liver transaminase levels: Secondary | ICD-10-CM

## 2019-02-03 DIAGNOSIS — I10 Essential (primary) hypertension: Secondary | ICD-10-CM | POA: Diagnosis present

## 2019-02-03 DIAGNOSIS — Z79899 Other long term (current) drug therapy: Secondary | ICD-10-CM

## 2019-02-03 DIAGNOSIS — F1120 Opioid dependence, uncomplicated: Secondary | ICD-10-CM | POA: Diagnosis present

## 2019-02-03 DIAGNOSIS — Z981 Arthrodesis status: Secondary | ICD-10-CM

## 2019-02-03 DIAGNOSIS — G9340 Encephalopathy, unspecified: Secondary | ICD-10-CM | POA: Diagnosis not present

## 2019-02-03 DIAGNOSIS — R1111 Vomiting without nausea: Secondary | ICD-10-CM | POA: Diagnosis not present

## 2019-02-03 DIAGNOSIS — Z8249 Family history of ischemic heart disease and other diseases of the circulatory system: Secondary | ICD-10-CM

## 2019-02-03 DIAGNOSIS — E119 Type 2 diabetes mellitus without complications: Secondary | ICD-10-CM

## 2019-02-03 DIAGNOSIS — M419 Scoliosis, unspecified: Secondary | ICD-10-CM | POA: Diagnosis present

## 2019-02-03 DIAGNOSIS — R32 Unspecified urinary incontinence: Secondary | ICD-10-CM | POA: Diagnosis present

## 2019-02-03 LAB — LACTIC ACID, PLASMA
Lactic Acid, Venous: 2 mmol/L (ref 0.5–1.9)
Lactic Acid, Venous: 0.9 mmol/L (ref 0.5–1.9)

## 2019-02-03 LAB — PROTIME-INR
INR: 1.4 — ABNORMAL HIGH (ref 0.8–1.2)
Prothrombin Time: 17.3 seconds — ABNORMAL HIGH (ref 11.4–15.2)

## 2019-02-03 LAB — CBG MONITORING, ED
Glucose-Capillary: 153 mg/dL — ABNORMAL HIGH (ref 70–99)
Glucose-Capillary: 110 mg/dL — ABNORMAL HIGH (ref 70–99)

## 2019-02-03 LAB — COMPREHENSIVE METABOLIC PANEL
ALT: 47 U/L — ABNORMAL HIGH (ref 0–44)
AST: 65 U/L — ABNORMAL HIGH (ref 15–41)
Albumin: 2.6 g/dL — ABNORMAL LOW (ref 3.5–5.0)
Alkaline Phosphatase: 109 U/L (ref 38–126)
Anion gap: 6 (ref 5–15)
BUN: 14 mg/dL (ref 8–23)
CO2: 23 mmol/L (ref 22–32)
Calcium: 8.4 mg/dL — ABNORMAL LOW (ref 8.9–10.3)
Chloride: 104 mmol/L (ref 98–111)
Creatinine, Ser: 0.42 mg/dL — ABNORMAL LOW (ref 0.44–1.00)
GFR calc Af Amer: >60 mL/min (ref >60)
GFR calc non Af Amer: >60 mL/min (ref >60)
Glucose, Bld: 147 mg/dL — ABNORMAL HIGH (ref 70–99)
Potassium: 3.1 mmol/L — ABNORMAL LOW (ref 3.5–5.1)
Sodium: 133 mmol/L — ABNORMAL LOW (ref 135–145)
Total Bilirubin: 1.5 mg/dL — ABNORMAL HIGH (ref 0.3–1.2)
Total Protein: 7 g/dL (ref 6.5–8.1)

## 2019-02-03 LAB — CBC WITH DIFFERENTIAL/PLATELET
Abs Immature Granulocytes: 0.08 10*3/uL — ABNORMAL HIGH (ref 0.00–0.07)
Basophils Absolute: 0 10*3/uL (ref 0.0–0.1)
Basophils Relative: 0 %
Eosinophils Absolute: 0 10*3/uL (ref 0.0–0.5)
Eosinophils Relative: 0 %
HCT: 32.1 % — ABNORMAL LOW (ref 36.0–46.0)
Hemoglobin: 10.7 g/dL — ABNORMAL LOW (ref 12.0–15.0)
Immature Granulocytes: 1 %
Lymphocytes Relative: 5 %
Lymphs Abs: 0.3 10*3/uL — ABNORMAL LOW (ref 0.7–4.0)
MCH: 31.4 pg (ref 26.0–34.0)
MCHC: 33.3 g/dL (ref 30.0–36.0)
MCV: 94.1 fL (ref 80.0–100.0)
Monocytes Absolute: 0.4 10*3/uL (ref 0.1–1.0)
Monocytes Relative: 6 %
Neutro Abs: 5 10*3/uL (ref 1.7–7.7)
Neutrophils Relative %: 88 %
Platelets: 46 10*3/uL — ABNORMAL LOW (ref 150–400)
RBC: 3.41 MIL/uL — ABNORMAL LOW (ref 3.87–5.11)
RDW: 15.1 % (ref 11.5–15.5)
WBC: 5.8 10*3/uL (ref 4.0–10.5)
nRBC: 0 % (ref 0.0–0.2)

## 2019-02-03 LAB — URINALYSIS, ROUTINE W REFLEX MICROSCOPIC
Bilirubin Urine: NEGATIVE
Glucose, UA: 150 mg/dL — AB
Hgb urine dipstick: NEGATIVE
Ketones, ur: NEGATIVE mg/dL
Leukocytes,Ua: NEGATIVE
Nitrite: NEGATIVE
Protein, ur: NEGATIVE mg/dL
Specific Gravity, Urine: 1.004 — ABNORMAL LOW (ref 1.005–1.030)
pH: 6 (ref 5.0–8.0)

## 2019-02-03 LAB — GLUCOSE, CAPILLARY: Glucose-Capillary: 91 mg/dL (ref 70–99)

## 2019-02-03 LAB — APTT: aPTT: 27 seconds (ref 24–36)

## 2019-02-03 LAB — SARS CORONAVIRUS 2 (TAT 6-24 HRS): SARS Coronavirus 2: NEGATIVE

## 2019-02-03 MED ORDER — ACETAMINOPHEN 325 MG PO TABS: 650.0000 mg | ORAL_TABLET | Freq: Four times a day (QID) | ORAL | Status: DC | PRN

## 2019-02-03 MED ORDER — SODIUM CHLORIDE 0.9% FLUSH
3.0000 mL | INTRAVENOUS | Status: DC | PRN
  Administered 2019-02-03: 3 mL via INTRAVENOUS
  Filled 2019-02-03: qty 3

## 2019-02-03 MED ORDER — POTASSIUM CHLORIDE 10 MEQ/100ML IV SOLN
10.0000 meq | Freq: Once | INTRAVENOUS | Status: AC
  Administered 2019-02-03: 10 meq via INTRAVENOUS
  Filled 2019-02-03: qty 100

## 2019-02-03 MED ORDER — LEVETIRACETAM 500 MG PO TABS
500.0000 mg | ORAL_TABLET | Freq: Two times a day (BID) | ORAL | Status: DC
  Administered 2019-02-03 – 2019-02-06 (×6): 500 mg via ORAL
  Filled 2019-02-03 (×6): qty 1

## 2019-02-03 MED ORDER — GADOBUTROL 1 MMOL/ML IV SOLN
6.0000 mL | Freq: Once | INTRAVENOUS | Status: AC | PRN
  Administered 2019-02-03: 6 mL via INTRAVENOUS

## 2019-02-03 MED ORDER — TRAZODONE HCL 50 MG PO TABS
100.0000 mg | ORAL_TABLET | Freq: Every day | ORAL | Status: DC
  Administered 2019-02-04: 100 mg via ORAL
  Filled 2019-02-03: qty 2

## 2019-02-03 MED ORDER — ACETAMINOPHEN 325 MG PO TABS
650.0000 mg | ORAL_TABLET | Freq: Once | ORAL | Status: AC
  Administered 2019-02-03: 650 mg via ORAL
  Filled 2019-02-03: qty 2

## 2019-02-03 MED ORDER — LISINOPRIL 10 MG PO TABS
10.0000 mg | ORAL_TABLET | Freq: Every day | ORAL | Status: DC
  Administered 2019-02-03 – 2019-02-06 (×3): 10 mg via ORAL
  Filled 2019-02-03 (×4): qty 1

## 2019-02-03 MED ORDER — SODIUM CHLORIDE 0.9 % IV SOLN
2.0000 g | Freq: Once | INTRAVENOUS | Status: AC
  Administered 2019-02-03: 05:00:00 2 g via INTRAVENOUS
  Filled 2019-02-03: qty 2

## 2019-02-03 MED ORDER — INSULIN ASPART 100 UNIT/ML ~~LOC~~ SOLN
3.0000 [IU] | Freq: Three times a day (TID) | SUBCUTANEOUS | Status: DC
  Administered 2019-02-04 – 2019-02-05 (×3): 3 [IU] via SUBCUTANEOUS

## 2019-02-03 MED ORDER — CYCLOBENZAPRINE HCL 10 MG PO TABS
10.0000 mg | ORAL_TABLET | Freq: Three times a day (TID) | ORAL | Status: DC
  Administered 2019-02-03 – 2019-02-04 (×2): 10 mg via ORAL
  Filled 2019-02-03 (×2): qty 1

## 2019-02-03 MED ORDER — ONDANSETRON HCL 4 MG/2ML IJ SOLN: 4.0000 mg | Freq: Four times a day (QID) | INTRAMUSCULAR | Status: DC | PRN

## 2019-02-03 MED ORDER — ONDANSETRON HCL 4 MG PO TABS: 4.0000 mg | ORAL_TABLET | Freq: Four times a day (QID) | ORAL | Status: DC | PRN

## 2019-02-03 MED ORDER — LACTATED RINGERS IV BOLUS
500.0000 mL | Freq: Once | INTRAVENOUS | Status: AC
  Administered 2019-02-03: 500 mL via INTRAVENOUS

## 2019-02-03 MED ORDER — SODIUM CHLORIDE 0.9% FLUSH
3.0000 mL | Freq: Two times a day (BID) | INTRAVENOUS | Status: DC
  Administered 2019-02-03 – 2019-02-06 (×5): 3 mL via INTRAVENOUS

## 2019-02-03 MED ORDER — INSULIN NPH (HUMAN) (ISOPHANE) 100 UNIT/ML ~~LOC~~ SUSP
20.0000 [IU] | Freq: Two times a day (BID) | SUBCUTANEOUS | Status: DC
  Administered 2019-02-04: 20 [IU] via SUBCUTANEOUS
  Filled 2019-02-03 (×2): qty 10

## 2019-02-03 MED ORDER — SODIUM CHLORIDE 0.9 % IV BOLUS
1000.0000 mL | Freq: Once | INTRAVENOUS | Status: AC
  Administered 2019-02-03: 1000 mL via INTRAVENOUS

## 2019-02-03 MED ORDER — INSULIN ASPART 100 UNIT/ML ~~LOC~~ SOLN
0.0000 [IU] | Freq: Three times a day (TID) | SUBCUTANEOUS | Status: DC
  Administered 2019-02-04: 2 [IU] via SUBCUTANEOUS
  Administered 2019-02-04: 1 [IU] via SUBCUTANEOUS

## 2019-02-03 MED ORDER — INSULIN ASPART 100 UNIT/ML ~~LOC~~ SOLN: 0.0000 [IU] | Freq: Every day | SUBCUTANEOUS | Status: DC

## 2019-02-03 MED ORDER — SODIUM CHLORIDE 0.9 % IV SOLN
2.0000 g | Freq: Two times a day (BID) | INTRAVENOUS | Status: DC
  Administered 2019-02-03 – 2019-02-04 (×2): 2 g via INTRAVENOUS
  Filled 2019-02-03 (×2): qty 2

## 2019-02-03 MED ORDER — ALBUTEROL SULFATE (2.5 MG/3ML) 0.083% IN NEBU: 2.5000 mg | INHALATION_SOLUTION | RESPIRATORY_TRACT | Status: DC | PRN

## 2019-02-03 MED ORDER — ACETAMINOPHEN 650 MG RE SUPP: 650.0000 mg | Freq: Four times a day (QID) | RECTAL | Status: DC | PRN

## 2019-02-03 MED ORDER — GABAPENTIN 300 MG PO CAPS
300.0000 mg | ORAL_CAPSULE | Freq: Three times a day (TID) | ORAL | Status: DC
  Administered 2019-02-03 – 2019-02-06 (×8): 300 mg via ORAL
  Filled 2019-02-03 (×8): qty 1

## 2019-02-03 MED ORDER — VANCOMYCIN HCL IN DEXTROSE 1-5 GM/200ML-% IV SOLN
1000.0000 mg | INTRAVENOUS | Status: DC
  Administered 2019-02-04 – 2019-02-06 (×3): 1000 mg via INTRAVENOUS
  Filled 2019-02-03 (×3): qty 200

## 2019-02-03 MED ORDER — TRAZODONE HCL 50 MG PO TABS: 50.0000 mg | ORAL_TABLET | Freq: Every evening | ORAL | Status: DC | PRN

## 2019-02-03 MED ORDER — VANCOMYCIN HCL IN DEXTROSE 1-5 GM/200ML-% IV SOLN
1000.0000 mg | Freq: Once | INTRAVENOUS | Status: AC
  Administered 2019-02-03: 1000 mg via INTRAVENOUS
  Filled 2019-02-03: qty 200

## 2019-02-03 MED ORDER — SODIUM CHLORIDE 0.9 % IV SOLN
250.0000 mL | INTRAVENOUS | Status: DC | PRN
  Administered 2019-02-04: 250 mL via INTRAVENOUS

## 2019-02-03 MED ORDER — VANCOMYCIN HCL 10 G IV SOLR: 1250.0000 mg | INTRAVENOUS | Status: DC

## 2019-02-03 MED ORDER — HEPARIN SODIUM (PORCINE) 5000 UNIT/ML IJ SOLN
5000.0000 [IU] | Freq: Three times a day (TID) | INTRAMUSCULAR | Status: DC
  Administered 2019-02-03 – 2019-02-04 (×4): 5000 [IU] via SUBCUTANEOUS
  Filled 2019-02-03 (×4): qty 1

## 2019-02-03 MED ORDER — METOPROLOL SUCCINATE ER 50 MG PO TB24
50.0000 mg | ORAL_TABLET | Freq: Every day | ORAL | Status: DC
  Administered 2019-02-04 – 2019-02-05 (×2): 50 mg via ORAL
  Filled 2019-02-03 (×2): qty 1

## 2019-02-03 MED ORDER — POLYETHYLENE GLYCOL 3350 17 G PO PACK: 17.0000 g | PACK | Freq: Every day | ORAL | Status: DC | PRN

## 2019-02-03 MED ORDER — METRONIDAZOLE IN NACL 5-0.79 MG/ML-% IV SOLN
500.0000 mg | Freq: Once | INTRAVENOUS | Status: AC
  Administered 2019-02-03: 500 mg via INTRAVENOUS
  Filled 2019-02-03: qty 100

## 2019-02-03 NOTE — ED Notes (Signed)
Dr Denton Brick paged and informed pt did not eat lunch, CBG 153, pt scheduled to receive 8 units of Novolog. Order received to hold insulin.

## 2019-02-03 NOTE — ED Notes (Addendum)
Date and time results received: 02/03/19 0610   Test: Lactic Acid Critical Value: 2.0  Name of Provider Notified: Rancour  Orders Received? Or Actions Taken?: na

## 2019-02-03 NOTE — ED Triage Notes (Signed)
Pt called ems for back pain x 2 hours.   On assessment, ems found pt to have fever 103.  Pt admits to uti's in the past, states this feels the same.  Pt with frequent urination

## 2019-02-03 NOTE — Progress Notes (Addendum)
Pharmacy Antibiotic Note  Grace Stokes is a 63 y.o. female admitted on 02/03/2019 with sepsis.  Pharmacy has been consulted for Vancomycin and Cefepime dosing.  Pt received Cefepime 2gm and Vanc 1gm in ED.  Plan: Cefepime 2gm IV q12h Vancomycin 1000 mg IV Q 24 hrs. Goal AUC 400-550. Expected AUC: 460 SCr used: 0.8 Will f/u renal function, micro data, and pt's clinical condition Vanc levels prn   Height: 5' (152.4 cm) Weight: 141 lb (64 kg) IBW/kg (Calculated) : 45.5  Temp (24hrs), Avg:100.6 F (38.1 C), Min:99.9 F (37.7 C), Max:101.2 F (38.4 C)  Recent Labs  Lab 02/03/19 0519  WBC 5.8  CREATININE 0.42*  LATICACIDVEN 2.0*    Estimated Creatinine Clearance: 60.1 mL/min (A) (by C-G formula based on SCr of 0.42 mg/dL (L)).    No Known Allergies  Antimicrobials this admission: 10/23 Vanc >>  10/23 Cefepime >>   Microbiology results: 10/23 BCx:   UCx:    Sars coronavirus 2:  Thank you for allowing pharmacy to be a part of this patient's care.  Sherlon Handing, PharmD, BCPS 02/03/2019 7:04 AM

## 2019-02-03 NOTE — H&P (Signed)
Patient Demographics:    Zanaiya Calabria, is a 63 y.o. female  MRN: [id number]   DOB - 1955-09-21  Admit Date - 02/03/2019  Outpatient Primary MD for the patient is Claretta Fraise, MD   Assessment & Plan:    Principal Problem:   Fever Active Problems:   Cervical spondylosis with myelopathy   Opiate addiction ([place])   Heroin abuse ([place])   HTN (hypertension)   Type 2 diabetes mellitus without complication, with long-term current use of insulin (H[facility])   Chronic active viral hepatitis B (H[facility])   Transaminitis   Hepatitis B   1)Fever with concerns for possible association the patient with history of IVDU--- CT abdomen with stone protocol without acute findings except for cholelithiasis, MRI of thoracic and lumbar spine without acute infectious type findings --In ED COVID-19 was negative -UA was not suggestive of UTI -WBCs 5.8 -In ED BP was low, improved with IV fluids -Lactic acid initially elevated at 2.0 down to 0.9 after IV fluids  chest x-ray not suggestive of pneumonia -Treat empirically with IV cefepime and vancomycin pending blood cultures given history of IVDU -Very low threshold for echocardiogram if fever persists or if positive blood cultures given history of IVDU  2)HTN-BP currently soft, hold amlodipine, hold lisinopril, hold clonidine, may give Toprol-XL 50 mg daily with hold parameters  3) DM2--- A1c was 9.7 on 01/10/2019, reflecting uncontrolled DM , change NPH insulin to 20 units twice daily with meals along with sliding scale coverage  4) chronic pain syndrome/opiate dependence----previously on Suboxone, be very judicious with opiates -Change methocarbamol to 500 mg 3 times daily, change gabapentin to 300 mg 3 times daily  5)Elevated LFTs--- patient with known hep B,--  AST of 65 and ALT of 47 and  total bili of 1.5 (all of  which are around patient's usual baseline)  With History of - Reviewed by me  Past Medical History:  Diagnosis Date  . Arthritis   . Bell's palsy   . Cataract   . Chronic pain   . Renal disorder   . Scoliosis   . Toxemia in pregnancy       Past Surgical History:  Procedure Laterality Date  . ANTERIOR CERVICAL DECOMP/DISCECTOMY FUSION N/A 07/09/2013   Procedure: ANTERIOR CERVICAL DECOMPRESSION/DISCECTOMY FUSION 2 LEVELS;  Surgeon: Consuella Lose, MD;  Location: [facility];  Service: Neurosurgery;  Laterality: N/A;  Anterior Cervical Fusion Cervical five-six, six-seven.  Marland Kitchen BACK SURGERY    . CARPAL TUNNEL RELEASE    . CESAREAN SECTION    . I&D EXTREMITY Left 12/17/2016   Procedure: IRRIGATION AND DEBRIDEMENT LEFT HAND;  Surgeon: Iran Planas, MD;  Location: [facility];  Service: Orthopedics;  Laterality: Left;  . INCISION / DRAINAGE HAND / FINGER Left 12/17/2016  . ROTATOR CUFF REPAIR    . SHOULDER SURGERY    . TUBAL LIGATION        Chief Complaint  Patient presents with  .  Fever  . Back Pain      HPI:    Edra Riccardi  is a 63 y.o. female with PMHx of chronic back pain, polysubstance abuse including IV heroin (recently on Suboxone), h/o  diabetes, hepatitis B presents with back pain and fever.  - -Currently patient has fevers with temp up to 103, some urinary concerns and persistent worsening back pain - -In ED COVID-19 was negative -UA was not suggestive of UTI -WBCs 5.8 -In ED BP was low, improved with IV fluids -Lactic acid initially elevated at 2.0 down to 0.9 after IV fluids  -Elevated LFTs noted with AST of 65 and ALT of 47 and total bili of 1.5 (all of  which are around patient's usual baseline) - -MRI of the thoracic and lumbar spine without evidence of infection -CT abdomen/renal stone protocol without acute intra-abdominal abnormality -Chest x-ray without pneumonia or other acute findings  -EDP obtained blood cultures and give  antibiotics given patient's history of IVDU now with high fevers   Review of systems:    In addition to the HPI above,   A full Review of  Systems was done, all other systems reviewed are negative except as noted above in HPI , .    Social History:  Reviewed by me    Social History   Tobacco Use  . Smoking status: Former Smoker    Types: Cigarettes  . Smokeless tobacco: Never Used  Substance Use Topics  . Alcohol use: No       Family History :  Reviewed by me  HTN  Home Medications:   Prior to Admission medications   Medication Sig Start Date End Date Taking? Authorizing Provider  amLODipine (NORVASC) 10 MG tablet Take 1 tablet (10 mg total) by mouth daily. For blood pressure 06/27/18   Claretta Fraise, MD  blood glucose meter kit and supplies KIT Dispense based on patient and insurance preference. Use up to four times daily as directed. 01/10/19   Claretta Fraise, MD  cloNIDine (CATAPRES) 0.1 MG tablet Take 0.5 tablets (0.05 mg total) by mouth 2 (two) times daily as needed for up to 8 days (withdrawals). Hold Systolic (top number) of BP< 110 or Heart Rate <60 12/25/18 01/02/19  Guilford Shi, MD  cyclobenzaprine (FLEXERIL) 10 MG tablet Take 1 tablet (10 mg total) by mouth 2 (two) times daily as needed for muscle spasms. 06/21/18   Claretta Fraise, MD  gabapentin (NEURONTIN) 100 MG capsule Take 1 capsule (100 mg total) by mouth 2 (two) times daily. 12/25/18 01/24/19  Guilford Shi, MD  insulin NPH Human (NOVOLIN N RELION) 100 UNIT/ML injection Inject 0.18 mLs (18 Units total) into the skin daily before breakfast AND 0.32 mLs (32 Units total) daily with supper. 09/22/18   Claretta Fraise, MD  Insulin Syringe-Needle U-100 (SAFETY-GLIDE 0.3CC SYR 29GX1/2) 29G X 1/2" 0.3 ML MISC Use as directed, 2 times daily with meals. 03/15/18   Claretta Fraise, MD  levETIRAcetam (KEPPRA) 500 MG tablet Take 1 tablet (500 mg total) by mouth 2 (two) times daily for 6 days. 12/25/18 12/31/18  Guilford Shi, MD  lisinopril (PRINIVIL,ZESTRIL) 10 MG tablet Take 1 tablet (10 mg total) by mouth daily. For kidney protection 06/21/18   Claretta Fraise, MD  metoprolol succinate (TOPROL-XL) 50 MG 24 hr tablet Take 1 tablet (50 mg total) by mouth daily. For heart and blood pressure 06/21/18   Claretta Fraise, MD  traZODone (DESYREL) 100 MG tablet Take 1 tablet (100 mg total) by mouth at  bedtime. 08/16/18   Claretta Fraise, MD  TRUEPLUS LANCETS 30G MISC Check blood sugar up to 4 times a day 02/05/17   Claretta Fraise, MD     Allergies:    No Known Allergies   Physical Exam:   Vitals  Blood pressure 101/67, pulse 77, temperature 98.3 F (36.8 C), temperature source Oral, resp. rate 14, height 5' (1.524 m), weight 64 kg, last menstrual period 04/06/2011, SpO2 96 %.  Physical Examination: General appearance - alert, well appearing, and in no distress Mental status - alert, oriented to person, place, and time,  Eyes - sclera anicteric Neck - supple, no JVD elevation , Chest - clear  to auscultation bilaterally, symmetrical air movement,  Heart - S1 and S2 normal, regular  Abdomen - soft, nontender, nondistended, no masses or organomegaly Neurological - screening mental status exam normal, neck supple without rigidity, cranial nerves II through XII intact, DTR's normal and symmetric Extremities - no pedal edema noted, intact peripheral pulses  Skin - warm, dry     Data Review:    CBC Recent Labs  Lab 02/03/19 0519  WBC 5.8  HGB 10.7*  HCT 32.1*  PLT 46*  MCV 94.1  MCH 31.4  MCHC 33.3  RDW 15.1  LYMPHSABS 0.3*  MONOABS 0.4  EOSABS 0.0  BASOSABS 0.0   ------------------------------------------------------------------------------------------------------------------  Chemistries  Recent Labs  Lab 02/03/19 0519  NA 133*  K 3.1*  CL 104  CO2 23  GLUCOSE 147*  BUN 14  CREATININE 0.42*  CALCIUM 8.4*  AST 65*  ALT 47*  ALKPHOS 109  BILITOT 1.5*    ------------------------------------------------------------------------------------------------------------------ estimated creatinine clearance is 60.1 mL/min (A) (by C-G formula based on SCr of 0.42 mg/dL (L)). ------------------------------------------------------------------------------------------------------------------ No results for input(s): TSH, T4TOTAL, T3FREE, THYROIDAB in the last 72 hours.  Invalid input(s): FREET3   Coagulation profile Recent Labs  Lab 02/03/19 0519  INR 1.4*   ------------------------------------------------------------------------------------------------------------------- No results for input(s): DDIMER in the last 72 hours. -------------------------------------------------------------------------------------------------------------------  Cardiac Enzymes No results for input(s): CKMB, TROPONINI, MYOGLOBIN in the last 168 hours.  Invalid input(s): CK ------------------------------------------------------------------------------------------------------------------ No results found for: BNP   ---------------------------------------------------------------------------------------------------------------  Urinalysis    Component Value Date/Time   COLORURINE YELLOW 02/03/2019 0423   APPEARANCEUR CLEAR 02/03/2019 0423   LABSPEC 1.004 (L) 02/03/2019 0423   PHURINE 6.0 02/03/2019 0423   GLUCOSEU 150 (A) 02/03/2019 0423   HGBUR NEGATIVE 02/03/2019 0423   BILIRUBINUR NEGATIVE 02/03/2019 0423   KETONESUR NEGATIVE 02/03/2019 0423   PROTEINUR NEGATIVE 02/03/2019 0423   UROBILINOGEN 0.2 07/08/2013 1703   NITRITE NEGATIVE 02/03/2019 0423   LEUKOCYTESUR NEGATIVE 02/03/2019 0423    ----------------------------------------------------------------------------------------------------------------   Imaging Results:    Mr Thoracic Spine W Wo Contrast  Result Date: 02/03/2019 CLINICAL DATA:  Back pain and fever. Intravenous drug use. EXAM: MRI  THORACIC WITHOUT AND WITH CONTRAST TECHNIQUE: Multiplanar and multiecho pulse sequences of the thoracic spine were obtained without and with intravenous contrast. CONTRAST:  36m GADAVIST GADOBUTROL 1 MMOL/ML IV SOLN COMPARISON:  CT scan of the thoracic spine dated 12/22/2018 FINDINGS: MRI THORACIC SPINE FINDINGS Alignment:  Physiologic. Vertebrae: No fracture, evidence of discitis, or bone lesion. Cord:  Normal signal and morphology. Paraspinal and other soft tissues: Negative. Disc levels: Previous anterior fusion at C5-6 and C6-7. C7-T1 through T6-7: No disc bulging or protrusion. No spinal or foraminal stenosis. T7-8: Small disc protrusion to the left of midline. Small disc bulge to the right of midline. No impingement upon the spinal cord. T8-9:  Small disc bulges to the right and left of midline without neural impingement. T9-10: Negative. T10-11: Slight hypertrophy of the left ligamentum flavum. No neural impingement. T11-12: Negative. T12-L1: Slight degenerative changes of the facet joints. Normal disc. After contrast administration there is no pathologic enhancement in the thoracic spine or in the adjacent soft tissues. IMPRESSION: 1. No significant abnormality of the thoracic spine. Specifically, no evidence of infection. 2. Slight degenerative changes of the facet joints at T12-L1. 3. Small disc protrusion to the left of midline at T7-8 without neural impingement. Electronically Signed   By: Lorriane Shire M.D.   On: 02/03/2019 10:30   Mr Lumbar Spine W Wo Contrast  Addendum Date: 02/03/2019   ADDENDUM REPORT: 02/03/2019 10:51 ADDENDUM: Impression #5 should read: Mild to moderate spinal and bilateral lateral recess stenosis at L3-4, right greater than left. Electronically Signed   By: Marijo Sanes M.D.   On: 02/03/2019 10:51   Result Date: 02/03/2019 CLINICAL DATA:  Sepsis, fever and back pain. Evaluate for discitis/osteomyelitis or epidural abscess. EXAM: MRI THORACIC AND LUMBAR SPINE WITHOUT AND  WITH CONTRAST TECHNIQUE: Multiplanar and multiecho pulse sequences of the thoracic and lumbar spine were obtained without and with intravenous contrast. CONTRAST:  62m GADAVIST GADOBUTROL 1 MMOL/ML IV SOLN COMPARISON:  CT scan 12/22/2018 FINDINGS: MRI THORACIC SPINE FINDINGS Alignment:  Normal Vertebrae: Normal marrow signal. No bone lesions or fractures. No findings suspicious for septic arthritis or osteomyelitis. T8 and L1 hemangiomas are noted. Cord: Normal appearance of the thoracic spinal cord. No cord lesions or syrinx. Paraspinal and other soft tissues: No significant paraspinal or mediastinal abnormalities. The lungs demonstrate atelectasis or scarring changes but no obvious lesions or pleural effusions. Disc levels: Left paracentral disc protrusion at T7-8 with mass effect on the left side of the thecal sac. Shallow disc protrusion and spurring at T8-9 with mass effect on both sides of the thecal sac. The other intervertebral disc spaces are unremarkable. No findings for discitis, osteomyelitis or epidural abscess. MRI LUMBAR SPINE FINDINGS Segmentation: There are five lumbar type vertebral bodies. The last full intervertebral disc space is labeled L5-S1. Alignment: Straightening of the normal lumbar lordosis unchanged since prior CT scan. Vertebrae: L1 and L4 hemangiomas are noted. No bone lesions or fractures. No findings for discitis or osteomyelitis. L4-5 fusion changes are noted. Conus medullaris: Extends to the L1 level and appears normal. Paraspinal and other soft tissues: No significant paraspinal or retroperitoneal findings. Disc levels: L1-2: No significant findings.  Moderate facet disease. L2-3: Diffuse bulging annulus, osteophytic ridging and facet disease with a large left-sided facet spur on the left. These findings contribute to severe left lateral recess stenosis with significant impingement on the left L3 nerve root. There is also moderately severe spinal stenosis. No significant right  lateral recess stenosis and no foraminal stenosis. L3-4: Diffuse bulging annulus, osteophytic ridging and advanced facet disease contributing to mild to moderate spinal and bilateral lateral recess stenosis, left greater than right. No significant foraminal stenosis. L4-5: Interbody and posterior fusion changes. No significant spinal or foraminal stenosis. L5-S1: No significant findings. IMPRESSION: 1. No findings for discitis or osteomyelitis involving the thoracic or lumbar spine. 2. Thoracic disc protrusions at T7-8 and T8-9. 3. Degenerative lumbar spondylosis with multilevel disc disease and facet disease. 4. The most significant findings in the lumbar spine are at L2-3 with there is moderately severe spinal stenosis and severe left lateral recess stenosis. 5. Mild to moderate spinal and bilateral lateral recesses abscess at L3-4, left  greater than right. Electronically Signed: By: Marijo Sanes M.D. On: 02/03/2019 09:47   Dg Chest Port 1 View  Result Date: 02/03/2019 CLINICAL DATA:  Code sepsis with fever and back pain EXAM: PORTABLE CHEST 1 VIEW COMPARISON:  Chest CT 12/22/2018 FINDINGS: Normal heart size and mediastinal contours. No acute infiltrate or edema. No effusion or pneumothorax. No acute osseous findings. IMPRESSION: Negative chest. Electronically Signed   By: Monte Fantasia M.D.   On: 02/03/2019 05:06   Ct Renal Stone Study  Result Date: 02/03/2019 CLINICAL DATA:  Sepsis and back pain.  IV drug abuse EXAM: CT ABDOMEN AND PELVIS WITHOUT CONTRAST TECHNIQUE: Multidetector CT imaging of the abdomen and pelvis was performed following the standard protocol without IV contrast. COMPARISON:  12/22/2018 FINDINGS: Lower chest: Lobulation along the lower esophagus from varices. Coronary calcification. Hepatobiliary: Cirrhosis. No acute or focal hepatic finding.Full gallbladder with 2 tiny calcified stones. No pericholecystic inflammatory changes. Pancreas: Generalized atrophy Spleen: Enlarged (18 cm  craniocaudal), homogeneous spleen attributed to portal hypertension. Adrenals/Urinary Tract: Negative adrenals. No hydronephrosis or stone. Unremarkable bladder. Stomach/Bowel:  No obstruction. No evidence of bowel inflammation Vascular/Lymphatic: No acute vascular abnormality. Minimal fat haziness along the proximal IMA is stable and presumably scarring. Scattered atherosclerotic calcifications. No mass or adenopathy. Reproductive:Negative Other: No ascites or pneumoperitoneum. Musculoskeletal: Degenerative straightening of the back with L4-5 and L5-S1 fusion. L2-3 and L3-4 disc and facet degeneration with prominent left-sided spur at L2-3 causing implied impingement. IMPRESSION: 1. No acute finding. 2. Cirrhosis with portal hypertension and varices. 3. Cholelithiasis. 4.  Aortic Atherosclerosis (ICD10-I70.0). Electronically Signed   By: Monte Fantasia M.D.   On: 02/03/2019 06:45    Radiological Exams on Admission: Mr Thoracic Spine W Wo Contrast  Result Date: 02/03/2019 CLINICAL DATA:  Back pain and fever. Intravenous drug use. EXAM: MRI THORACIC WITHOUT AND WITH CONTRAST TECHNIQUE: Multiplanar and multiecho pulse sequences of the thoracic spine were obtained without and with intravenous contrast. CONTRAST:  77m GADAVIST GADOBUTROL 1 MMOL/ML IV SOLN COMPARISON:  CT scan of the thoracic spine dated 12/22/2018 FINDINGS: MRI THORACIC SPINE FINDINGS Alignment:  Physiologic. Vertebrae: No fracture, evidence of discitis, or bone lesion. Cord:  Normal signal and morphology. Paraspinal and other soft tissues: Negative. Disc levels: Previous anterior fusion at C5-6 and C6-7. C7-T1 through T6-7: No disc bulging or protrusion. No spinal or foraminal stenosis. T7-8: Small disc protrusion to the left of midline. Small disc bulge to the right of midline. No impingement upon the spinal cord. T8-9: Small disc bulges to the right and left of midline without neural impingement. T9-10: Negative. T10-11: Slight hypertrophy of  the left ligamentum flavum. No neural impingement. T11-12: Negative. T12-L1: Slight degenerative changes of the facet joints. Normal disc. After contrast administration there is no pathologic enhancement in the thoracic spine or in the adjacent soft tissues. IMPRESSION: 1. No significant abnormality of the thoracic spine. Specifically, no evidence of infection. 2. Slight degenerative changes of the facet joints at T12-L1. 3. Small disc protrusion to the left of midline at T7-8 without neural impingement. Electronically Signed   By: JLorriane ShireM.D.   On: 02/03/2019 10:30   Mr Lumbar Spine W Wo Contrast  Addendum Date: 02/03/2019   ADDENDUM REPORT: 02/03/2019 10:51 ADDENDUM: Impression #5 should read: Mild to moderate spinal and bilateral lateral recess stenosis at L3-4, right greater than left. Electronically Signed   By: PMarijo SanesM.D.   On: 02/03/2019 10:51   Result Date: 02/03/2019 CLINICAL DATA:  Sepsis, fever  and back pain. Evaluate for discitis/osteomyelitis or epidural abscess. EXAM: MRI THORACIC AND LUMBAR SPINE WITHOUT AND WITH CONTRAST TECHNIQUE: Multiplanar and multiecho pulse sequences of the thoracic and lumbar spine were obtained without and with intravenous contrast. CONTRAST:  102m GADAVIST GADOBUTROL 1 MMOL/ML IV SOLN COMPARISON:  CT scan 12/22/2018 FINDINGS: MRI THORACIC SPINE FINDINGS Alignment:  Normal Vertebrae: Normal marrow signal. No bone lesions or fractures. No findings suspicious for septic arthritis or osteomyelitis. T8 and L1 hemangiomas are noted. Cord: Normal appearance of the thoracic spinal cord. No cord lesions or syrinx. Paraspinal and other soft tissues: No significant paraspinal or mediastinal abnormalities. The lungs demonstrate atelectasis or scarring changes but no obvious lesions or pleural effusions. Disc levels: Left paracentral disc protrusion at T7-8 with mass effect on the left side of the thecal sac. Shallow disc protrusion and spurring at T8-9 with mass  effect on both sides of the thecal sac. The other intervertebral disc spaces are unremarkable. No findings for discitis, osteomyelitis or epidural abscess. MRI LUMBAR SPINE FINDINGS Segmentation: There are five lumbar type vertebral bodies. The last full intervertebral disc space is labeled L5-S1. Alignment: Straightening of the normal lumbar lordosis unchanged since prior CT scan. Vertebrae: L1 and L4 hemangiomas are noted. No bone lesions or fractures. No findings for discitis or osteomyelitis. L4-5 fusion changes are noted. Conus medullaris: Extends to the L1 level and appears normal. Paraspinal and other soft tissues: No significant paraspinal or retroperitoneal findings. Disc levels: L1-2: No significant findings.  Moderate facet disease. L2-3: Diffuse bulging annulus, osteophytic ridging and facet disease with a large left-sided facet spur on the left. These findings contribute to severe left lateral recess stenosis with significant impingement on the left L3 nerve root. There is also moderately severe spinal stenosis. No significant right lateral recess stenosis and no foraminal stenosis. L3-4: Diffuse bulging annulus, osteophytic ridging and advanced facet disease contributing to mild to moderate spinal and bilateral lateral recess stenosis, left greater than right. No significant foraminal stenosis. L4-5: Interbody and posterior fusion changes. No significant spinal or foraminal stenosis. L5-S1: No significant findings. IMPRESSION: 1. No findings for discitis or osteomyelitis involving the thoracic or lumbar spine. 2. Thoracic disc protrusions at T7-8 and T8-9. 3. Degenerative lumbar spondylosis with multilevel disc disease and facet disease. 4. The most significant findings in the lumbar spine are at L2-3 with there is moderately severe spinal stenosis and severe left lateral recess stenosis. 5. Mild to moderate spinal and bilateral lateral recesses abscess at L3-4, left greater than right. Electronically  Signed: By: PMarijo SanesM.D. On: 02/03/2019 09:47   Dg Chest Port 1 View  Result Date: 02/03/2019 CLINICAL DATA:  Code sepsis with fever and back pain EXAM: PORTABLE CHEST 1 VIEW COMPARISON:  Chest CT 12/22/2018 FINDINGS: Normal heart size and mediastinal contours. No acute infiltrate or edema. No effusion or pneumothorax. No acute osseous findings. IMPRESSION: Negative chest. Electronically Signed   By: JMonte FantasiaM.D.   On: 02/03/2019 05:06   Ct Renal Stone Study  Result Date: 02/03/2019 CLINICAL DATA:  Sepsis and back pain.  IV drug abuse EXAM: CT ABDOMEN AND PELVIS WITHOUT CONTRAST TECHNIQUE: Multidetector CT imaging of the abdomen and pelvis was performed following the standard protocol without IV contrast. COMPARISON:  12/22/2018 FINDINGS: Lower chest: Lobulation along the lower esophagus from varices. Coronary calcification. Hepatobiliary: Cirrhosis. No acute or focal hepatic finding.Full gallbladder with 2 tiny calcified stones. No pericholecystic inflammatory changes. Pancreas: Generalized atrophy Spleen: Enlarged (18 cm craniocaudal), homogeneous spleen  attributed to portal hypertension. Adrenals/Urinary Tract: Negative adrenals. No hydronephrosis or stone. Unremarkable bladder. Stomach/Bowel:  No obstruction. No evidence of bowel inflammation Vascular/Lymphatic: No acute vascular abnormality. Minimal fat haziness along the proximal IMA is stable and presumably scarring. Scattered atherosclerotic calcifications. No mass or adenopathy. Reproductive:Negative Other: No ascites or pneumoperitoneum. Musculoskeletal: Degenerative straightening of the back with L4-5 and L5-S1 fusion. L2-3 and L3-4 disc and facet degeneration with prominent left-sided spur at L2-3 causing implied impingement. IMPRESSION: 1. No acute finding. 2. Cirrhosis with portal hypertension and varices. 3. Cholelithiasis. 4.  Aortic Atherosclerosis (ICD10-I70.0). Electronically Signed   By: Monte Fantasia M.D.   On:  02/03/2019 06:45    DVT Prophylaxis -SCD  /heparin AM Labs Ordered, also please review Full Orders  Family Communication: Admission, patients condition and plan of care including tests being ordered have been discussed with the patient  who indicate understanding and agree with the plan   Code Status - Full Code  Likely DC to  Home   Condition   stable  Roxan Hockey M.D on 02/03/2019 at 11:03 AM Go to [email or web address] -  for contact info  [facility] - Office  [phone]

## 2019-02-03 NOTE — ED Notes (Signed)
ED TO INPATIENT HANDOFF REPORT  ED Nurse Name and Phone #: [phone]  S Name/Age/Gender Grace Stokes 63 y.o. female Room/Bed: APA11/APA11  Code Status   Code Status: Full Code  Home/SNF/Other Home Patient oriented to: self, place and time Is this baseline? Yes   Triage Complete: Triage complete  Chief Complaint back pain and fever  Triage Note Pt called ems for back pain x 2 hours.   On assessment, ems found pt to have fever 103.  Pt admits to uti's in the past, states this feels the same.  Pt with frequent urination   Allergies No Known Allergies  Level of Care/Admitting Diagnosis ED Disposition    ED Disposition Condition [facility] Area: [facility] [id number]  Level of Care: Telemetry [5]  Covid Evaluation: N/A  Diagnosis: Fever [id number]  Admitting Physician: Morrison Old  Attending Physician: Morrison Old  Bed request comments: tele  PT Class (Do Not Modify): Observation [104]  PT Acc Code (Do Not Modify): Observation [10022]       B Medical/Surgery History Past Medical History:  Diagnosis Date  . Arthritis   . Bell's palsy   . Cataract   . Chronic pain   . Renal disorder   . Scoliosis   . Toxemia in pregnancy    Past Surgical History:  Procedure Laterality Date  . ANTERIOR CERVICAL DECOMP/DISCECTOMY FUSION N/A 07/09/2013   Procedure: ANTERIOR CERVICAL DECOMPRESSION/DISCECTOMY FUSION 2 LEVELS;  Surgeon: Consuella Lose, MD;  Location: [facility];  Service: Neurosurgery;  Laterality: N/A;  Anterior Cervical Fusion Cervical five-six, six-seven.  Marland Kitchen BACK SURGERY    . CARPAL TUNNEL RELEASE    . CESAREAN SECTION    . I&D EXTREMITY Left 12/17/2016   Procedure: IRRIGATION AND DEBRIDEMENT LEFT HAND;  Surgeon: Iran Planas, MD;  Location: [facility];  Service: Orthopedics;  Laterality: Left;  . INCISION / DRAINAGE HAND / FINGER Left 12/17/2016  . ROTATOR CUFF REPAIR    . SHOULDER SURGERY    . TUBAL LIGATION        A IV Location/Drains/Wounds Patient Lines/Drains/Airways Status   Active Line/Drains/Airways    Name:   Placement date:   Placement time:   Site:   Days:   Peripheral IV 02/03/19 Left Wrist   02/03/19    0415    Wrist   less than 1          Intake/Output Last 24 hours  Intake/Output Summary (Last 24 hours) at 02/03/2019 1659 Last data filed at 02/03/2019 1110 Gross per 24 hour  Intake 521.28 ml  Output -  Net 521.28 ml    Labs/Imaging Results for orders placed or performed during the hospital encounter of 02/03/19 (from the past 48 hour(s))  Urinalysis, Routine w reflex microscopic     Status: Abnormal   Collection Time: 02/03/19  4:23 AM  Result Value Ref Range   Color, Urine YELLOW YELLOW   APPearance CLEAR CLEAR   Specific Gravity, Urine 1.004 (L) 1.005 - 1.030   pH 6.0 5.0 - 8.0   Glucose, UA 150 (A) NEGATIVE mg/dL   Hgb urine dipstick NEGATIVE NEGATIVE   Bilirubin Urine NEGATIVE NEGATIVE   Ketones, ur NEGATIVE NEGATIVE mg/dL   Protein, ur NEGATIVE NEGATIVE mg/dL   Nitrite NEGATIVE NEGATIVE   Leukocytes,Ua NEGATIVE NEGATIVE    Comment: Performed at [facility], [address] [place], [place] [address]  Lactic acid, plasma     Status: Abnormal   Collection Time:  02/03/19  5:19 AM  Result Value Ref Range   Lactic Acid, Venous 2.0 (HH) 0.5 - 1.9 mmol/L    Comment: CRITICAL RESULT CALLED TO, READ BACK BY AND VERIFIED WITH: ANDREW,L AT 6:10AM ON 02/03/19 BY [facility] Performed at [facility], [address] [place], [place] [address]   Comprehensive metabolic panel     Status: Abnormal   Collection Time: 02/03/19  5:19 AM  Result Value Ref Range   Sodium 133 (L) 135 - 145 mmol/L   Potassium 3.1 (L) 3.5 - 5.1 mmol/L   Chloride 104 98 - 111 mmol/L   CO2 23 22 - 32 mmol/L   Glucose, Bld 147 (H) 70 - 99 mg/dL   BUN 14 8 - 23 mg/dL   Creatinine, Ser 0.42 (L) 0.44 - 1.00 mg/dL   Calcium 8.4 (L) 8.9 - 10.3 mg/dL   Total Protein 7.0 6.5 - 8.1 g/dL    Albumin 2.6 (L) 3.5 - 5.0 g/dL   AST 65 (H) 15 - 41 U/L   ALT 47 (H) 0 - 44 U/L   Alkaline Phosphatase 109 38 - 126 U/L   Total Bilirubin 1.5 (H) 0.3 - 1.2 mg/dL   GFR calc non Af Amer >60 >60 mL/min   GFR calc Af Amer >60 >60 mL/min   Anion gap 6 5 - 15    Comment: Performed at [facility], [address] [place], [place] [address]  CBC WITH DIFFERENTIAL     Status: Abnormal   Collection Time: 02/03/19  5:19 AM  Result Value Ref Range   WBC 5.8 4.0 - 10.5 K/uL   RBC 3.41 (L) 3.87 - 5.11 MIL/uL   Hemoglobin 10.7 (L) 12.0 - 15.0 g/dL   HCT 32.1 (L) 36.0 - 46.0 %   MCV 94.1 80.0 - 100.0 fL   MCH 31.4 26.0 - 34.0 pg   MCHC 33.3 30.0 - 36.0 g/dL   RDW 15.1 11.5 - 15.5 %   Platelets 46 (L) 150 - 400 K/uL    Comment: REPEATED TO VERIFY PLATELET COUNT CONFIRMED BY SMEAR SPECIMEN CHECKED FOR CLOTS Immature Platelet Fraction may be clinically indicated, consider ordering this additional test [id number]    nRBC 0.0 0.0 - 0.2 %   Neutrophils Relative % 88 %   Neutro Abs 5.0 1.7 - 7.7 K/uL   Lymphocytes Relative 5 %   Lymphs Abs 0.3 (L) 0.7 - 4.0 K/uL   Monocytes Relative 6 %   Monocytes Absolute 0.4 0.1 - 1.0 K/uL   Eosinophils Relative 0 %   Eosinophils Absolute 0.0 0.0 - 0.5 K/uL   Basophils Relative 0 %   Basophils Absolute 0.0 0.0 - 0.1 K/uL   Immature Granulocytes 1 %   Abs Immature Granulocytes 0.08 (H) 0.00 - 0.07 K/uL    Comment: Performed at [facility], [address], [place], [place] [address]  APTT     Status: None   Collection Time: 02/03/19  5:19 AM  Result Value Ref Range   aPTT 27 24 - 36 seconds    Comment: Performed at [facility], [address] [place], [place] [address]  Protime-INR     Status: Abnormal   Collection Time: 02/03/19  5:19 AM  Result Value Ref Range   Prothrombin Time 17.3 (H) 11.4 - 15.2 seconds   INR 1.4 (H) 0.8 - 1.2    Comment: (NOTE) INR goal varies based on device and disease states. Performed at [facility], [address]  [place], [place] [address]  Blood Culture (routine x 2)     Status: None (Preliminary result)   Collection Time: 02/03/19  5:19 AM   Specimen: BLOOD  Result Value Ref Range   Specimen Description BLOOD DRAWN BY IV THERAPY LEFT WRIST    Special Requests      BOTTLES DRAWN AEROBIC AND ANAEROBIC Blood Culture adequate volume   Culture      NO GROWTH < 12 HOURS Performed at [facility], [address], [place], [place] [address]    Report Status PENDING   Blood Culture (routine x 2)     Status: None (Preliminary result)   Collection Time: 02/03/19  5:36 AM   Specimen: BLOOD  Result Value Ref Range   Specimen Description BLOOD RIGHT HAND    Special Requests      BOTTLES DRAWN AEROBIC AND ANAEROBIC Blood Culture adequate volume   Culture      NO GROWTH < 12 HOURS Performed at [facility], [address], [place], [place] [address]    Report Status PENDING   Lactic acid, plasma     Status: None   Collection Time: 02/03/19  7:38 AM  Result Value Ref Range   Lactic Acid, Venous 0.9 0.5 - 1.9 mmol/L    Comment: Performed at [facility], [address], [place], [place] [address]  CBG monitoring, ED     Status: Abnormal   Collection Time: 02/03/19 11:41 AM  Result Value Ref Range   Glucose-Capillary 153 (H) 70 - 99 mg/dL   Mr Thoracic Spine W Wo Contrast  Result Date: 02/03/2019 CLINICAL DATA:  Back pain and fever. Intravenous drug use. EXAM: MRI THORACIC WITHOUT AND WITH CONTRAST TECHNIQUE: Multiplanar and multiecho pulse sequences of the thoracic spine were obtained without and with intravenous contrast. CONTRAST:  38mL GADAVIST GADOBUTROL 1 MMOL/ML IV SOLN COMPARISON:  CT scan of the thoracic spine dated 12/22/2018 FINDINGS: MRI THORACIC SPINE FINDINGS Alignment:  Physiologic. Vertebrae: No fracture, evidence of discitis, or bone lesion. Cord:  Normal signal and morphology. Paraspinal and other soft tissues: Negative. Disc levels: Previous anterior fusion at C5-6 and C6-7. C7-T1 through  T6-7: No disc bulging or protrusion. No spinal or foraminal stenosis. T7-8: Small disc protrusion to the left of midline. Small disc bulge to the right of midline. No impingement upon the spinal cord. T8-9: Small disc bulges to the right and left of midline without neural impingement. T9-10: Negative. T10-11: Slight hypertrophy of the left ligamentum flavum. No neural impingement. T11-12: Negative. T12-L1: Slight degenerative changes of the facet joints. Normal disc. After contrast administration there is no pathologic enhancement in the thoracic spine or in the adjacent soft tissues. IMPRESSION: 1. No significant abnormality of the thoracic spine. Specifically, no evidence of infection. 2. Slight degenerative changes of the facet joints at T12-L1. 3. Small disc protrusion to the left of midline at T7-8 without neural impingement. Electronically Signed   By: Lorriane Shire M.D.   On: 02/03/2019 10:30   Mr Lumbar Spine W Wo Contrast  Addendum Date: 02/03/2019   ADDENDUM REPORT: 02/03/2019 10:51 ADDENDUM: Impression #5 should read: Mild to moderate spinal and bilateral lateral recess stenosis at L3-4, right greater than left. Electronically Signed   By: Marijo Sanes M.D.   On: 02/03/2019 10:51   Result Date: 02/03/2019 CLINICAL DATA:  Sepsis, fever and back pain. Evaluate for discitis/osteomyelitis or epidural abscess. EXAM: MRI THORACIC AND LUMBAR SPINE WITHOUT AND WITH CONTRAST TECHNIQUE: Multiplanar and multiecho pulse sequences of the thoracic and lumbar spine  were obtained without and with intravenous contrast. CONTRAST:  50mL GADAVIST GADOBUTROL 1 MMOL/ML IV SOLN COMPARISON:  CT scan 12/22/2018 FINDINGS: MRI THORACIC SPINE FINDINGS Alignment:  Normal Vertebrae: Normal marrow signal. No bone lesions or fractures. No findings suspicious for septic arthritis or osteomyelitis. T8 and L1 hemangiomas are noted. Cord: Normal appearance of the thoracic spinal cord. No cord lesions or syrinx. Paraspinal and other  soft tissues: No significant paraspinal or mediastinal abnormalities. The lungs demonstrate atelectasis or scarring changes but no obvious lesions or pleural effusions. Disc levels: Left paracentral disc protrusion at T7-8 with mass effect on the left side of the thecal sac. Shallow disc protrusion and spurring at T8-9 with mass effect on both sides of the thecal sac. The other intervertebral disc spaces are unremarkable. No findings for discitis, osteomyelitis or epidural abscess. MRI LUMBAR SPINE FINDINGS Segmentation: There are five lumbar type vertebral bodies. The last full intervertebral disc space is labeled L5-S1. Alignment: Straightening of the normal lumbar lordosis unchanged since prior CT scan. Vertebrae: L1 and L4 hemangiomas are noted. No bone lesions or fractures. No findings for discitis or osteomyelitis. L4-5 fusion changes are noted. Conus medullaris: Extends to the L1 level and appears normal. Paraspinal and other soft tissues: No significant paraspinal or retroperitoneal findings. Disc levels: L1-2: No significant findings.  Moderate facet disease. L2-3: Diffuse bulging annulus, osteophytic ridging and facet disease with a large left-sided facet spur on the left. These findings contribute to severe left lateral recess stenosis with significant impingement on the left L3 nerve root. There is also moderately severe spinal stenosis. No significant right lateral recess stenosis and no foraminal stenosis. L3-4: Diffuse bulging annulus, osteophytic ridging and advanced facet disease contributing to mild to moderate spinal and bilateral lateral recess stenosis, left greater than right. No significant foraminal stenosis. L4-5: Interbody and posterior fusion changes. No significant spinal or foraminal stenosis. L5-S1: No significant findings. IMPRESSION: 1. No findings for discitis or osteomyelitis involving the thoracic or lumbar spine. 2. Thoracic disc protrusions at T7-8 and T8-9. 3. Degenerative  lumbar spondylosis with multilevel disc disease and facet disease. 4. The most significant findings in the lumbar spine are at L2-3 with there is moderately severe spinal stenosis and severe left lateral recess stenosis. 5. Mild to moderate spinal and bilateral lateral recesses abscess at L3-4, left greater than right. Electronically Signed: By: Marijo Sanes M.D. On: 02/03/2019 09:47   Dg Chest Port 1 View  Result Date: 02/03/2019 CLINICAL DATA:  Code sepsis with fever and back pain EXAM: PORTABLE CHEST 1 VIEW COMPARISON:  Chest CT 12/22/2018 FINDINGS: Normal heart size and mediastinal contours. No acute infiltrate or edema. No effusion or pneumothorax. No acute osseous findings. IMPRESSION: Negative chest. Electronically Signed   By: Monte Fantasia M.D.   On: 02/03/2019 05:06   Ct Renal Stone Study  Result Date: 02/03/2019 CLINICAL DATA:  Sepsis and back pain.  IV drug abuse EXAM: CT ABDOMEN AND PELVIS WITHOUT CONTRAST TECHNIQUE: Multidetector CT imaging of the abdomen and pelvis was performed following the standard protocol without IV contrast. COMPARISON:  12/22/2018 FINDINGS: Lower chest: Lobulation along the lower esophagus from varices. Coronary calcification. Hepatobiliary: Cirrhosis. No acute or focal hepatic finding.Full gallbladder with 2 tiny calcified stones. No pericholecystic inflammatory changes. Pancreas: Generalized atrophy Spleen: Enlarged (18 cm craniocaudal), homogeneous spleen attributed to portal hypertension. Adrenals/Urinary Tract: Negative adrenals. No hydronephrosis or stone. Unremarkable bladder. Stomach/Bowel:  No obstruction. No evidence of bowel inflammation Vascular/Lymphatic: No acute vascular abnormality. Minimal fat haziness  along the proximal IMA is stable and presumably scarring. Scattered atherosclerotic calcifications. No mass or adenopathy. Reproductive:Negative Other: No ascites or pneumoperitoneum. Musculoskeletal: Degenerative straightening of the back with L4-5  and L5-S1 fusion. L2-3 and L3-4 disc and facet degeneration with prominent left-sided spur at L2-3 causing implied impingement. IMPRESSION: 1. No acute finding. 2. Cirrhosis with portal hypertension and varices. 3. Cholelithiasis. 4.  Aortic Atherosclerosis (ICD10-I70.0). Electronically Signed   By: Monte Fantasia M.D.   On: 02/03/2019 06:45    Pending Labs Unresulted Labs (From admission, onward)    Start     Ordered   02/04/19 [id number]  Basic metabolic panel  Tomorrow morning,   R     02/03/19 1218   02/04/19 0500  CBC  Tomorrow morning,   R     02/03/19 1218   02/03/19 0431  SARS CORONAVIRUS 2 (TAT 6-24 HRS) Nasopharyngeal Nasopharyngeal Swab  (Asymptomatic/Tier 2 Patients Labs)  Once,   STAT    Question Answer Comment  Is this test for diagnosis or screening Diagnosis of ill patient   Symptomatic for COVID-19 as defined by CDC Yes   Date of Symptom Onset 02/03/2019   Hospitalized for COVID-19 Yes   Admitted to ICU for COVID-19 No   Previously tested for COVID-19 Yes   Resident in a congregate (group) care setting No   Employed in healthcare setting No   Pregnant No      02/03/19 0430   02/03/19 0423  Urine culture  ONCE - STAT,   STAT     02/03/19 0423          Vitals/Pain Today's Vitals   02/03/19 1430 02/03/19 1500 02/03/19 1530 02/03/19 1600  BP: 110/62 (!) 104/58 (!) 101/54 102/64  Pulse: 76 82 77 83  Resp: 13 12 12 12   Temp:      TempSrc:      SpO2: 97% 96% 96% 98%  Weight:      Height:      PainSc:        Isolation Precautions No active isolations  Medications Medications  ceFEPIme (MAXIPIME) 2 g in sodium chloride 0.9 % 100 mL IVPB (has no administration in time range)  vancomycin (VANCOCIN) IVPB 1000 mg/200 mL premix (has no administration in time range)  cyclobenzaprine (FLEXERIL) tablet 10 mg (10 mg Oral Given 02/03/19 1626)  lisinopril (ZESTRIL) tablet 10 mg (10 mg Oral Given 02/03/19 1343)  metoprolol succinate (TOPROL-XL) 24 hr tablet 50 mg (has no  administration in time range)  traZODone (DESYREL) tablet 100 mg (has no administration in time range)  levETIRAcetam (KEPPRA) tablet 500 mg (500 mg Oral Given 02/03/19 1343)  gabapentin (NEURONTIN) capsule 300 mg (300 mg Oral Given 02/03/19 1626)  insulin NPH Human (NOVOLIN N) injection 20 Units (has no administration in time range)  sodium chloride flush (NS) 0.9 % injection 3 mL (3 mLs Intravenous Given 02/03/19 1344)  sodium chloride flush (NS) 0.9 % injection 3 mL (3 mLs Intravenous Given 02/03/19 1344)  0.9 %  sodium chloride infusion (has no administration in time range)  acetaminophen (TYLENOL) tablet 650 mg (has no administration in time range)    Or  acetaminophen (TYLENOL) suppository 650 mg (has no administration in time range)  traZODone (DESYREL) tablet 50 mg (has no administration in time range)  polyethylene glycol (MIRALAX / GLYCOLAX) packet 17 g (has no administration in time range)  ondansetron (ZOFRAN) tablet 4 mg (has no administration in time range)    Or  ondansetron (ZOFRAN)  injection 4 mg (has no administration in time range)  albuterol (PROVENTIL) (2.5 MG/3ML) 0.083% nebulizer solution 2.5 mg (has no administration in time range)  heparin injection 5,000 Units (5,000 Units Subcutaneous Given 02/03/19 1343)  insulin aspart (novoLOG) injection 0-9 Units (0 Units Subcutaneous Not Given 02/03/19 1246)  insulin aspart (novoLOG) injection 0-5 Units (has no administration in time range)  insulin aspart (novoLOG) injection 3 Units (3 Units Subcutaneous Not Given 02/03/19 1246)  ceFEPIme (MAXIPIME) 2 g in sodium chloride 0.9 % 100 mL IVPB (0 g Intravenous Stopped 02/03/19 0530)  metroNIDAZOLE (FLAGYL) IVPB 500 mg (0 mg Intravenous Stopped 02/03/19 0704)  vancomycin (VANCOCIN) IVPB 1000 mg/200 mL premix (0 mg Intravenous Stopped 02/03/19 0703)  sodium chloride 0.9 % bolus 1,000 mL (0 mLs Intravenous Stopped 02/03/19 0945)  sodium chloride 0.9 % bolus 1,000 mL (0 mLs  Intravenous Stopped 02/03/19 0945)  acetaminophen (TYLENOL) tablet 650 mg (650 mg Oral Given 02/03/19 0543)  lactated ringers bolus 500 mL (0 mLs Intravenous Stopped 02/03/19 1011)  gadobutrol (GADAVIST) 1 MMOL/ML injection 6 mL (6 mLs Intravenous Contrast Given 02/03/19 0840)  potassium chloride 10 mEq in 100 mL IVPB (0 mEq Intravenous Stopped 02/03/19 1110)    Mobility walks Moderate fall risk   Focused Assessments    R Recommendations: See Admitting Provider Note  Report given to:   Additional Notes:

## 2019-02-03 NOTE — ED Provider Notes (Signed)
Patient care taken over to monitor and follow up results.  Clinical concern for sepsis with encephalopathy, likely UA.  With IVDU hx and back pain MRI ordered to look for abscess.  UA pending.  Urinalysis reviewed no acute infection.  Blood culture sent.  Discussed with radiology and no sign of abscess or discitis.  There is a small typo in one of the reports.  Covid test still pending concern for possibly Covid versus bacteremia with IV drug abuse history.  Discussed with hospitalist for admission.  .Critical Care Performed by: Elnora Morrison, MD Authorized by: Elnora Morrison, MD   Critical care provider statement:    Critical care time (minutes):  45   Critical care start time:  02/03/2019 8:05 AM   Critical care end time:  02/03/2019 8:50 AM   Critical care time was exclusive of:  Separately billable procedures and treating other patients and teaching time   Critical care was necessary to treat or prevent imminent or life-threatening deterioration of the following conditions:  Sepsis   Critical care was time spent personally by me on the following activities:  Discussions with consultants, evaluation of patient's response to treatment, examination of patient, ordering and performing treatments and interventions, ordering and review of laboratory studies, ordering and review of radiographic studies, pulse oximetry, re-evaluation of patient's condition, obtaining history from patient or surrogate and review of old charts   The patients results and plan were reviewed and discussed.   Any x-rays performed were independently reviewed by myself.   Differential diagnosis were considered with the presenting HPI.  Medications  ceFEPIme (MAXIPIME) 2 g in sodium chloride 0.9 % 100 mL IVPB (has no administration in time range)  vancomycin (VANCOCIN) IVPB 1000 mg/200 mL premix (has no administration in time range)  potassium chloride 10 mEq in 100 mL IVPB (10 mEq Intravenous New Bag/Given 02/03/19 1010)   ceFEPIme (MAXIPIME) 2 g in sodium chloride 0.9 % 100 mL IVPB (0 g Intravenous Stopped 02/03/19 0530)  metroNIDAZOLE (FLAGYL) IVPB 500 mg (0 mg Intravenous Stopped 02/03/19 0704)  vancomycin (VANCOCIN) IVPB 1000 mg/200 mL premix (0 mg Intravenous Stopped 02/03/19 0703)  sodium chloride 0.9 % bolus 1,000 mL (0 mLs Intravenous Stopped 02/03/19 0945)  sodium chloride 0.9 % bolus 1,000 mL (0 mLs Intravenous Stopped 02/03/19 0945)  acetaminophen (TYLENOL) tablet 650 mg (650 mg Oral Given 02/03/19 0543)  lactated ringers bolus 500 mL (0 mLs Intravenous Stopped 02/03/19 1011)  gadobutrol (GADAVIST) 1 MMOL/ML injection 6 mL (6 mLs Intravenous Contrast Given 02/03/19 0840)    Vitals:   02/03/19 0700 02/03/19 0730 02/03/19 0936 02/03/19 1000  BP: 114/64 130/85 (!) 95/53 (!) 93/54  Pulse: (!) 105 (!) 108 74 93  Resp: 16 (!) 21 16   Temp:   98.3 F (36.8 C)   TempSrc:   Oral   SpO2: 94% (!) 89% 95% 94%  Weight:      Height:        Final diagnoses:  Sepsis with encephalopathy without septic shock, due to unspecified organism ([place])  Hypokalemia    Admission/ observation were discussed with the admitting physician, and they are comfortable with the plan.      Elnora Morrison, MD 02/03/19 1055

## 2019-02-03 NOTE — ED Notes (Signed)
1st set of blood cultures drawn at 04:55 by RN. Lab collected blood drawn by RN at at 05:19. Blood clicked off as collected at 05:19 and given to lab. 1st antibiotic started at 05:00 am. Second set of blood cultures obtained by lab at 05:48am. Other antibiotics started prior to second set of blood cultures collected by lab( See Mar).
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
## 2019-02-03 NOTE — ED Notes (Signed)
Grace [phone]

## 2019-02-04 ENCOUNTER — Encounter (HOSPITAL_COMMUNITY): Payer: Self-pay

## 2019-02-04 DIAGNOSIS — Z79899 Other long term (current) drug therapy: Secondary | ICD-10-CM | POA: Diagnosis not present

## 2019-02-04 DIAGNOSIS — M1990 Unspecified osteoarthritis, unspecified site: Secondary | ICD-10-CM | POA: Diagnosis present

## 2019-02-04 DIAGNOSIS — K8020 Calculus of gallbladder without cholecystitis without obstruction: Secondary | ICD-10-CM | POA: Diagnosis present

## 2019-02-04 DIAGNOSIS — Z794 Long term (current) use of insulin: Secondary | ICD-10-CM | POA: Diagnosis not present

## 2019-02-04 DIAGNOSIS — B181 Chronic viral hepatitis B without delta-agent: Secondary | ICD-10-CM | POA: Diagnosis not present

## 2019-02-04 DIAGNOSIS — R7881 Bacteremia: Secondary | ICD-10-CM | POA: Diagnosis present

## 2019-02-04 DIAGNOSIS — R509 Fever, unspecified: Secondary | ICD-10-CM | POA: Diagnosis not present

## 2019-02-04 DIAGNOSIS — Z981 Arthrodesis status: Secondary | ICD-10-CM | POA: Diagnosis not present

## 2019-02-04 DIAGNOSIS — E876 Hypokalemia: Secondary | ICD-10-CM | POA: Diagnosis present

## 2019-02-04 DIAGNOSIS — F1110 Opioid abuse, uncomplicated: Secondary | ICD-10-CM | POA: Diagnosis not present

## 2019-02-04 DIAGNOSIS — Z20828 Contact with and (suspected) exposure to other viral communicable diseases: Secondary | ICD-10-CM | POA: Diagnosis present

## 2019-02-04 DIAGNOSIS — Z87891 Personal history of nicotine dependence: Secondary | ICD-10-CM | POA: Diagnosis not present

## 2019-02-04 DIAGNOSIS — B1910 Unspecified viral hepatitis B without hepatic coma: Secondary | ICD-10-CM

## 2019-02-04 DIAGNOSIS — R32 Unspecified urinary incontinence: Secondary | ICD-10-CM | POA: Diagnosis present

## 2019-02-04 DIAGNOSIS — M419 Scoliosis, unspecified: Secondary | ICD-10-CM | POA: Diagnosis present

## 2019-02-04 DIAGNOSIS — R7401 Elevation of levels of liver transaminase levels: Secondary | ICD-10-CM | POA: Diagnosis not present

## 2019-02-04 DIAGNOSIS — M4712 Other spondylosis with myelopathy, cervical region: Secondary | ICD-10-CM | POA: Diagnosis present

## 2019-02-04 DIAGNOSIS — G894 Chronic pain syndrome: Secondary | ICD-10-CM | POA: Diagnosis present

## 2019-02-04 DIAGNOSIS — Z8249 Family history of ischemic heart disease and other diseases of the circulatory system: Secondary | ICD-10-CM | POA: Diagnosis not present

## 2019-02-04 DIAGNOSIS — F1120 Opioid dependence, uncomplicated: Secondary | ICD-10-CM | POA: Diagnosis present

## 2019-02-04 DIAGNOSIS — R7989 Other specified abnormal findings of blood chemistry: Secondary | ICD-10-CM | POA: Diagnosis present

## 2019-02-04 DIAGNOSIS — E1165 Type 2 diabetes mellitus with hyperglycemia: Secondary | ICD-10-CM | POA: Diagnosis present

## 2019-02-04 DIAGNOSIS — I10 Essential (primary) hypertension: Secondary | ICD-10-CM | POA: Diagnosis present

## 2019-02-04 LAB — CBC
HCT: 28.6 % — ABNORMAL LOW (ref 36.0–46.0)
Hemoglobin: 9.4 g/dL — ABNORMAL LOW (ref 12.0–15.0)
MCH: 32 pg (ref 26.0–34.0)
MCHC: 32.9 g/dL (ref 30.0–36.0)
MCV: 97.3 fL (ref 80.0–100.0)
Platelets: 35 10*3/uL — ABNORMAL LOW (ref 150–400)
RBC: 2.94 MIL/uL — ABNORMAL LOW (ref 3.87–5.11)
RDW: 15.7 % — ABNORMAL HIGH (ref 11.5–15.5)
WBC: 2.3 10*3/uL — ABNORMAL LOW (ref 4.0–10.5)
nRBC: 0 % (ref 0.0–0.2)

## 2019-02-04 LAB — GLUCOSE, CAPILLARY
Glucose-Capillary: 159 mg/dL — ABNORMAL HIGH (ref 70–99)
Glucose-Capillary: 144 mg/dL — ABNORMAL HIGH (ref 70–99)
Glucose-Capillary: 67 mg/dL — ABNORMAL LOW (ref 70–99)
Glucose-Capillary: 93 mg/dL (ref 70–99)
Glucose-Capillary: 106 mg/dL — ABNORMAL HIGH (ref 70–99)

## 2019-02-04 LAB — BASIC METABOLIC PANEL
Anion gap: 7 (ref 5–15)
BUN: 17 mg/dL (ref 8–23)
CO2: 22 mmol/L (ref 22–32)
Calcium: 7.9 mg/dL — ABNORMAL LOW (ref 8.9–10.3)
Chloride: 110 mmol/L (ref 98–111)
Creatinine, Ser: 0.46 mg/dL (ref 0.44–1.00)
GFR calc Af Amer: >60 mL/min (ref >60)
GFR calc non Af Amer: >60 mL/min (ref >60)
Glucose, Bld: 188 mg/dL — ABNORMAL HIGH (ref 70–99)
Potassium: 3.2 mmol/L — ABNORMAL LOW (ref 3.5–5.1)
Sodium: 139 mmol/L (ref 135–145)

## 2019-02-04 LAB — URINE CULTURE: Culture: NO GROWTH

## 2019-02-04 MED ORDER — HYDROXYZINE HCL 25 MG PO TABS
25.0000 mg | ORAL_TABLET | Freq: Three times a day (TID) | ORAL | Status: DC
  Administered 2019-02-04 – 2019-02-06 (×7): 25 mg via ORAL
  Filled 2019-02-04 (×7): qty 1

## 2019-02-04 MED ORDER — SODIUM CHLORIDE 0.9 % IV SOLN
2.0000 g | Freq: Three times a day (TID) | INTRAVENOUS | Status: DC
  Administered 2019-02-04 – 2019-02-06 (×7): 2 g via INTRAVENOUS
  Filled 2019-02-04 (×7): qty 2

## 2019-02-04 MED ORDER — INSULIN NPH (HUMAN) (ISOPHANE) 100 UNIT/ML ~~LOC~~ SUSP
10.0000 [IU] | Freq: Two times a day (BID) | SUBCUTANEOUS | Status: DC
  Administered 2019-02-04 – 2019-02-06 (×4): 10 [IU] via SUBCUTANEOUS

## 2019-02-04 MED ORDER — SODIUM CHLORIDE 0.9 % IV SOLN
INTRAVENOUS | Status: DC
  Administered 2019-02-04 – 2019-02-05 (×2): via INTRAVENOUS

## 2019-02-04 MED ORDER — POTASSIUM CHLORIDE CRYS ER 20 MEQ PO TBCR
40.0000 meq | EXTENDED_RELEASE_TABLET | ORAL | Status: AC
  Administered 2019-02-04 (×2): 40 meq via ORAL
  Filled 2019-02-04 (×2): qty 2

## 2019-02-04 MED ORDER — HYDROXYZINE HCL 25 MG PO TABS
50.0000 mg | ORAL_TABLET | Freq: Every day | ORAL | Status: DC
  Administered 2019-02-04 – 2019-02-05 (×2): 50 mg via ORAL
  Filled 2019-02-04 (×2): qty 2

## 2019-02-04 MED ORDER — METHOCARBAMOL 500 MG PO TABS
750.0000 mg | ORAL_TABLET | Freq: Four times a day (QID) | ORAL | Status: DC
  Administered 2019-02-04 – 2019-02-06 (×8): 750 mg via ORAL
  Filled 2019-02-04 (×8): qty 2

## 2019-02-04 NOTE — Progress Notes (Signed)
Patient Demographics:    Grace Stokes, is a 63 y.o. female, DOB - 1955/10/11, [id number]  Admit date - 02/03/2019   Admitting Physician Grace Tate Denton Brick, MD  Outpatient Primary MD for the patient is Grace Fraise, MD  LOS - 0   Chief Complaint  Patient presents with  . Fever  . Back Pain        Subjective:    Grace Stokes today has  no emesis,  No chest pain,    -No further fevers, oral intake is poor, sugars are erratic  Assessment  & Plan :    Principal Problem:   Fever Active Problems:   Cervical spondylosis with myelopathy   Opiate addiction (HCC)   Heroin abuse (HCC)   HTN (hypertension)   Type 2 diabetes mellitus without complication, with long-term current use of insulin (HCC)   Chronic active viral hepatitis B ([employer])   Transaminitis   Hepatitis B  Brief Summary 63 y.o. female with PMHx of chronic back pain, polysubstance abuse including IV heroin  last used 4 days PTA, (recently on Suboxone), h/o  diabetes, hepatitis B admitted on 02/03/2019 with back pain and fever.  A/p 1)Fever with concerns for possible association the patient with history of IVDU---  last IV heroin use according the patient was 4 days PTA,  -CT abdomen with stone protocol without acute findings except for cholelithiasis, MRI of thoracic and lumbar spine without acute infectious type findings --In ED COVID-19 was negative -UA was not suggestive of UTI -WBCs 5.8 -In ED BP was low, improved with IV fluids -Lactic acid initially elevated at 2.0 down to 0.9 after IV fluids  chest x-ray not suggestive of pneumonia -Continue IV cefepime and vancomycin pending blood cultures given history of IVDU -Very low threshold for echocardiogram if fever persists or if positive blood cultures given history of IVDU  2)HTN-BP remains soft, continue to hold amlodipine, hold lisinopril, hold clonidine, may give Toprol-XL 50  mg daily with hold parameters  3) DM2--- A1c was 9.7 on 01/10/2019, reflecting uncontrolled DM ,  -Oral intake is not great so sugars have been a bit erratic c/n  NPH insulin to 20 units twice daily with meals along with sliding scale coverage  4) chronic pain syndrome/opiate dependence----previously on Subutex -be very judicious with opiates -Continue methocarbamol 750 mg 3 times daily,gabapentin to 300 mg 3 times daily, as well as hydroxyzine  5)Elevated LFTs--- patient with known hep B,--  AST of 65 and ALT of 47 and total bili of 1.5 (all of  which are around patient's usual baseline)  Disposition/Need for in-Hospital Stay- patient unable to be discharged at this time due to -requiring IV antibiotics pending culture results  Code Status : Full  Family Communication:   NA (patient is alert, awake and coherent)  Disposition Plan  : TBD  Consults  :  Na  DVT Prophylaxis  :    -  SCDs (low platelets)  Lab Results  Component Value Date   PLT 35 (L) 02/04/2019   Inpatient Medications  Scheduled Meds: . gabapentin  300 mg Oral TID  . heparin  5,000 Units Subcutaneous Q8H  . hydrOXYzine  25 mg Oral TID WC  . hydrOXYzine  50 mg Oral QHS  .  insulin aspart  0-5 Units Subcutaneous QHS  . insulin aspart  0-9 Units Subcutaneous TID WC  . insulin aspart  3 Units Subcutaneous TID WC  . insulin NPH Human  10 Units Subcutaneous BID AC & HS  . levETIRAcetam  500 mg Oral BID  . lisinopril  10 mg Oral Daily  . methocarbamol  750 mg Oral QID  . metoprolol succinate  50 mg Oral QHS  . sodium chloride flush  3 mL Intravenous Q12H  . traZODone  100 mg Oral QHS   Continuous Infusions: . sodium chloride 250 mL (02/04/19 0541)  . sodium chloride 100 mL/hr at 02/04/19 0953  . ceFEPime (MAXIPIME) IV 2 g (02/04/19 1431)  . vancomycin 1,000 mg (02/04/19 0616)   PRN Meds:.sodium chloride, acetaminophen **OR** acetaminophen, albuterol, ondansetron **OR** ondansetron (ZOFRAN) IV, polyethylene  glycol, sodium chloride flush, traZODone    Anti-infectives (From admission, onward)   Start     Dose/Rate Route Frequency Ordered Stop   02/04/19 2200  vancomycin (VANCOCIN) 1,250 mg in sodium chloride 0.9 % 250 mL IVPB  Status:  Discontinued     1,250 mg 166.7 mL/hr over 90 Minutes Intravenous Every 36 hours 02/03/19 0712 02/03/19 0715   02/04/19 1400  ceFEPIme (MAXIPIME) 2 g in sodium chloride 0.9 % 100 mL IVPB     2 g 200 mL/hr over 30 Minutes Intravenous Every 8 hours 02/04/19 0837     02/04/19 0600  vancomycin (VANCOCIN) IVPB 1000 mg/200 mL premix     1,000 mg 200 mL/hr over 60 Minutes Intravenous Every 24 hours 02/03/19 0715     02/03/19 1800  ceFEPIme (MAXIPIME) 2 g in sodium chloride 0.9 % 100 mL IVPB  Status:  Discontinued     2 g 200 mL/hr over 30 Minutes Intravenous Every 12 hours 02/03/19 0712 02/04/19 0837   02/03/19 0430  ceFEPIme (MAXIPIME) 2 g in sodium chloride 0.9 % 100 mL IVPB     2 g 200 mL/hr over 30 Minutes Intravenous  Once 02/03/19 0423 02/03/19 0530   02/03/19 0430  metroNIDAZOLE (FLAGYL) IVPB 500 mg     500 mg 100 mL/hr over 60 Minutes Intravenous  Once 02/03/19 0423 02/03/19 0704   02/03/19 0430  vancomycin (VANCOCIN) IVPB 1000 mg/200 mL premix     1,000 mg 200 mL/hr over 60 Minutes Intravenous  Once 02/03/19 0423 02/03/19 0703        Objective:   Vitals:   02/04/19 0502 02/04/19 0830 02/04/19 0948 02/04/19 1332  BP: (!) 95/52  106/63 108/60  Pulse: 74   75  Resp: 16   17  Temp: 98 F (36.7 C)   99 F (37.2 C)  TempSrc:    Oral  SpO2: 100% 100%  96%  Weight:      Height:        Wt Readings from Last 3 Encounters:  02/03/19 67.4 kg  01/10/19 64 kg  12/23/18 68 kg     Intake/Output Summary (Last 24 hours) at 02/04/2019 1617 Last data filed at 02/04/2019 1518 Gross per 24 hour  Intake 1056.01 ml  Output 1000 ml  Net 56.01 ml     Physical Exam  Gen:- Awake Alert,  In no apparent distress  HEENT:- [place].AT, No sclera icterus  Neck-Supple Neck,No JVD,.  Lungs-  CTAB , fair symmetrical air movement CV- S1, S2 normal, regular  Abd-  +ve B.Sounds, Abd Soft, No tenderness,    Extremity/Skin:- No  edema, pedal pulses present  Psych-affect is appropriate,  oriented x3 Neuro-no new focal deficits, no tremors   Data Review:   Micro Results Recent Results (from the past 240 hour(s))  Urine culture     Status: None   Collection Time: 02/03/19  4:23 AM   Specimen: In/Out Cath Urine  Result Value Ref Range Status   Specimen Description   Final    IN/OUT CATH URINE Performed at [facility], [address] [name], [place] [address]    Special Requests   Final    NONE Performed at [facility], [address] [place], [place] [address]    Culture   Final    NO GROWTH Performed at [facility], [place] [address], [place], [place] [address]    Report Status 02/04/2019 FINAL  Final  SARS CORONAVIRUS 2 (TAT 6-24 HRS) Nasopharyngeal Nasopharyngeal Swab     Status: None   Collection Time: 02/03/19  4:31 AM   Specimen: Nasopharyngeal Swab  Result Value Ref Range Status   SARS Coronavirus 2 NEGATIVE NEGATIVE Final    Comment: (NOTE) SARS-CoV-2 target nucleic acids are NOT DETECTED. The SARS-CoV-2 RNA is generally detectable in upper and lower respiratory specimens during the acute phase of infection. Negative results do not preclude SARS-CoV-2 infection, do not rule out co-infections with other pathogens, and should not be used as the sole basis for treatment or other patient management decisions. Negative results must be combined with clinical observations, patient history, and epidemiological information. The expected result is Negative. Fact Sheet for Patients: SugarRoll.be Fact Sheet for Healthcare Providers: https://www.woods-mathews.com/ This test is not yet approved or cleared by the [place] FDA and  has been authorized for detection and/or diagnosis of  SARS-CoV-2 by FDA under an Emergency Use Authorization (EUA). This EUA will remain  in effect (meaning this test can be used) for the duration of the COVID-19 declaration under Section 56 4(b)(1) of the Act, 21 U.S.C. section 360bbb-3(b)(1), unless the authorization is terminated or revoked sooner. Performed at [facility], [place] [address], [place], [place] [address]   Blood Culture (routine x 2)     Status: None (Preliminary result)   Collection Time: 02/03/19  5:19 AM   Specimen: BLOOD  Result Value Ref Range Status   Specimen Description BLOOD DRAWN BY IV THERAPY LEFT WRIST  Final   Special Requests   Final    BOTTLES DRAWN AEROBIC AND ANAEROBIC Blood Culture adequate volume   Culture   Final    NO GROWTH 1 DAY Performed at [facility], [address] [place], [place] [address]    Report Status PENDING  Incomplete  Blood Culture (routine x 2)     Status: None (Preliminary result)   Collection Time: 02/03/19  5:36 AM   Specimen: BLOOD  Result Value Ref Range Status   Specimen Description BLOOD RIGHT HAND  Final   Special Requests   Final    BOTTLES DRAWN AEROBIC AND ANAEROBIC Blood Culture adequate volume   Culture   Final    NO GROWTH 1 DAY Performed at [facility], [address], [place], [place] [address]    Report Status PENDING  Incomplete    Radiology Reports Mr Thoracic Spine W Wo Contrast  Result Date: 02/03/2019 CLINICAL DATA:  Back pain and fever. Intravenous drug use. EXAM: MRI THORACIC WITHOUT AND WITH CONTRAST TECHNIQUE: Multiplanar and multiecho pulse sequences of the thoracic spine were obtained without and with intravenous contrast. CONTRAST:  70mL GADAVIST GADOBUTROL 1 MMOL/ML IV SOLN COMPARISON:  CT scan of the thoracic spine dated 12/22/2018 FINDINGS: MRI THORACIC SPINE FINDINGS Alignment:  Physiologic. Vertebrae: No fracture, evidence of discitis, or bone lesion. Cord:  Normal signal and morphology. Paraspinal and other soft tissues: Negative. Disc  levels: Previous anterior fusion at C5-6 and C6-7. C7-T1 through T6-7: No disc bulging or protrusion. No spinal or foraminal stenosis. T7-8: Small disc protrusion to the left of midline. Small disc bulge to the right of midline. No impingement upon the spinal cord. T8-9: Small disc bulges to the right and left of midline without neural impingement. T9-10: Negative. T10-11: Slight hypertrophy of the left ligamentum flavum. No neural impingement. T11-12: Negative. T12-L1: Slight degenerative changes of the facet joints. Normal disc. After contrast administration there is no pathologic enhancement in the thoracic spine or in the adjacent soft tissues. IMPRESSION: 1. No significant abnormality of the thoracic spine. Specifically, no evidence of infection. 2. Slight degenerative changes of the facet joints at T12-L1. 3. Small disc protrusion to the left of midline at T7-8 without neural impingement. Electronically Signed   By: Lorriane Shire M.D.   On: 02/03/2019 10:30   Mr Lumbar Spine W Wo Contrast  Addendum Date: 02/03/2019   ADDENDUM REPORT: 02/03/2019 10:51 ADDENDUM: Impression #5 should read: Mild to moderate spinal and bilateral lateral recess stenosis at L3-4, right greater than left. Electronically Signed   By: Marijo Sanes M.D.   On: 02/03/2019 10:51   Result Date: 02/03/2019 CLINICAL DATA:  Sepsis, fever and back pain. Evaluate for discitis/osteomyelitis or epidural abscess. EXAM: MRI THORACIC AND LUMBAR SPINE WITHOUT AND WITH CONTRAST TECHNIQUE: Multiplanar and multiecho pulse sequences of the thoracic and lumbar spine were obtained without and with intravenous contrast. CONTRAST:  50mL GADAVIST GADOBUTROL 1 MMOL/ML IV SOLN COMPARISON:  CT scan 12/22/2018 FINDINGS: MRI THORACIC SPINE FINDINGS Alignment:  Normal Vertebrae: Normal marrow signal. No bone lesions or fractures. No findings suspicious for septic arthritis or osteomyelitis. T8 and L1 hemangiomas are noted. Cord: Normal appearance of the  thoracic spinal cord. No cord lesions or syrinx. Paraspinal and other soft tissues: No significant paraspinal or mediastinal abnormalities. The lungs demonstrate atelectasis or scarring changes but no obvious lesions or pleural effusions. Disc levels: Left paracentral disc protrusion at T7-8 with mass effect on the left side of the thecal sac. Shallow disc protrusion and spurring at T8-9 with mass effect on both sides of the thecal sac. The other intervertebral disc spaces are unremarkable. No findings for discitis, osteomyelitis or epidural abscess. MRI LUMBAR SPINE FINDINGS Segmentation: There are five lumbar type vertebral bodies. The last full intervertebral disc space is labeled L5-S1. Alignment: Straightening of the normal lumbar lordosis unchanged since prior CT scan. Vertebrae: L1 and L4 hemangiomas are noted. No bone lesions or fractures. No findings for discitis or osteomyelitis. L4-5 fusion changes are noted. Conus medullaris: Extends to the L1 level and appears normal. Paraspinal and other soft tissues: No significant paraspinal or retroperitoneal findings. Disc levels: L1-2: No significant findings.  Moderate facet disease. L2-3: Diffuse bulging annulus, osteophytic ridging and facet disease with a large left-sided facet spur on the left. These findings contribute to severe left lateral recess stenosis with significant impingement on the left L3 nerve root. There is also moderately severe spinal stenosis. No significant right lateral recess stenosis and no foraminal stenosis. L3-4: Diffuse bulging annulus, osteophytic ridging and advanced facet disease contributing to mild to moderate spinal and bilateral lateral recess stenosis, left greater than right. No significant foraminal stenosis. L4-5: Interbody and posterior  fusion changes. No significant spinal or foraminal stenosis. L5-S1: No significant findings. IMPRESSION: 1. No findings for discitis or osteomyelitis involving the thoracic or lumbar  spine. 2. Thoracic disc protrusions at T7-8 and T8-9. 3. Degenerative lumbar spondylosis with multilevel disc disease and facet disease. 4. The most significant findings in the lumbar spine are at L2-3 with there is moderately severe spinal stenosis and severe left lateral recess stenosis. 5. Mild to moderate spinal and bilateral lateral recesses abscess at L3-4, left greater than right. Electronically Signed: By: Marijo Sanes M.D. On: 02/03/2019 09:47   Dg Chest Port 1 View  Result Date: 02/03/2019 CLINICAL DATA:  Code sepsis with fever and back pain EXAM: PORTABLE CHEST 1 VIEW COMPARISON:  Chest CT 12/22/2018 FINDINGS: Normal heart size and mediastinal contours. No acute infiltrate or edema. No effusion or pneumothorax. No acute osseous findings. IMPRESSION: Negative chest. Electronically Signed   By: Monte Fantasia M.D.   On: 02/03/2019 05:06   Ct Renal Stone Study  Result Date: 02/03/2019 CLINICAL DATA:  Sepsis and back pain.  IV drug abuse EXAM: CT ABDOMEN AND PELVIS WITHOUT CONTRAST TECHNIQUE: Multidetector CT imaging of the abdomen and pelvis was performed following the standard protocol without IV contrast. COMPARISON:  12/22/2018 FINDINGS: Lower chest: Lobulation along the lower esophagus from varices. Coronary calcification. Hepatobiliary: Cirrhosis. No acute or focal hepatic finding.Full gallbladder with 2 tiny calcified stones. No pericholecystic inflammatory changes. Pancreas: Generalized atrophy Spleen: Enlarged (18 cm craniocaudal), homogeneous spleen attributed to portal hypertension. Adrenals/Urinary Tract: Negative adrenals. No hydronephrosis or stone. Unremarkable bladder. Stomach/Bowel:  No obstruction. No evidence of bowel inflammation Vascular/Lymphatic: No acute vascular abnormality. Minimal fat haziness along the proximal IMA is stable and presumably scarring. Scattered atherosclerotic calcifications. No mass or adenopathy. Reproductive:Negative Other: No ascites or  pneumoperitoneum. Musculoskeletal: Degenerative straightening of the back with L4-5 and L5-S1 fusion. L2-3 and L3-4 disc and facet degeneration with prominent left-sided spur at L2-3 causing implied impingement. IMPRESSION: 1. No acute finding. 2. Cirrhosis with portal hypertension and varices. 3. Cholelithiasis. 4.  Aortic Atherosclerosis (ICD10-I70.0). Electronically Signed   By: Monte Fantasia M.D.   On: 02/03/2019 06:45     CBC Recent Labs  Lab 02/03/19 0519 02/04/19 0716  WBC 5.8 2.3*  HGB 10.7* 9.4*  HCT 32.1* 28.6*  PLT 46* 35*  MCV 94.1 97.3  MCH 31.4 32.0  MCHC 33.3 32.9  RDW 15.1 15.7*  LYMPHSABS 0.3*  --   MONOABS 0.4  --   EOSABS 0.0  --   BASOSABS 0.0  --     Chemistries  Recent Labs  Lab 02/03/19 0519 02/04/19 0716  NA 133* 139  K 3.1* 3.2*  CL 104 110  CO2 23 22  GLUCOSE 147* 188*  BUN 14 17  CREATININE 0.42* 0.46  CALCIUM 8.4* 7.9*  AST 65*  --   ALT 47*  --   ALKPHOS 109  --   BILITOT 1.5*  --    ------------------------------------------------------------------------------------------------------------------ No results for input(s): CHOL, HDL, LDLCALC, TRIG, CHOLHDL, LDLDIRECT in the last 72 hours.  Lab Results  Component Value Date   HGBA1C 9.7 (H) 01/10/2019   ------------------------------------------------------------------------------------------------------------------ No results for input(s): TSH, T4TOTAL, T3FREE, THYROIDAB in the last 72 hours.  Invalid input(s): FREET3 ------------------------------------------------------------------------------------------------------------------ No results for input(s): VITAMINB12, FOLATE, FERRITIN, TIBC, IRON, RETICCTPCT in the last 72 hours.  Coagulation profile Recent Labs  Lab 02/03/19 0519  INR 1.4*    No results for input(s): DDIMER in the last 72 hours.  Cardiac  Enzymes No results for input(s): CKMB, TROPONINI, MYOGLOBIN in the last 168 hours.  Invalid input(s): CK  ------------------------------------------------------------------------------------------------------------------ No results found for: BNP   Roxan Hockey M.D on 02/04/2019 at 4:17 PM  Go to [email or web address] - for contact info  [facility] - Office  [phone]

## 2019-02-04 NOTE — Progress Notes (Signed)
Patient CBG 67. Patient asymptomatic and denies any discomfort or distress. Patient offered 4 ozs juice. MD has been notified. Will continue to monitor patient.

## 2019-02-04 NOTE — Progress Notes (Signed)
Patient's CBG 93.

## 2019-02-04 NOTE — Plan of Care (Signed)

## 2019-02-05 DIAGNOSIS — R509 Fever, unspecified: Secondary | ICD-10-CM | POA: Diagnosis not present

## 2019-02-05 LAB — GLUCOSE, CAPILLARY
Glucose-Capillary: 72 mg/dL (ref 70–99)
Glucose-Capillary: 98 mg/dL (ref 70–99)
Glucose-Capillary: 95 mg/dL (ref 70–99)
Glucose-Capillary: 165 mg/dL — ABNORMAL HIGH (ref 70–99)

## 2019-02-05 MED ORDER — TRAZODONE HCL 50 MG PO TABS
50.0000 mg | ORAL_TABLET | Freq: Every day | ORAL | Status: DC
  Administered 2019-02-05: 50 mg via ORAL
  Filled 2019-02-05: qty 1

## 2019-02-05 NOTE — Progress Notes (Signed)
Patient Demographics:    Grace Stokes, is a 63 y.o. female, DOB - 07-30-1955, [id number]  Admit date - 02/03/2019   Admitting Physician Flonnie Wierman [place] Brick, MD  Outpatient Primary MD for the patient is Claretta Fraise, MD  LOS - 1   Chief Complaint  Patient presents with  . Fever  . Back Pain        Subjective:    Grace Stokes today has  no emesis,  No chest pain,    -afebrile, no chills, significant other at bedside, questions answered  Assessment  & Plan :    Principal Problem:   Fever Active Problems:   Cervical spondylosis with myelopathy   Opiate addiction (HCC)   Heroin abuse (HCC)   HTN (hypertension)   Type 2 diabetes mellitus without complication, with long-term current use of insulin (HCC)   Chronic active viral hepatitis B ([place])   Transaminitis   Hepatitis B  Brief Summary 63 y.o. female with PMHx of chronic back pain, polysubstance abuse including IV heroin  last used 4 days PTA, (recently on Suboxone), h/o  diabetes, hepatitis B admitted on 02/03/2019 with back pain and fever.  A/p 1)Fever with concerns for possible bacteremia  In a patient with history of IVDU---  last IV heroin use according the patient was 4 days PTA,  -CT abdomen with stone protocol without acute findings except for cholelithiasis, MRI of thoracic and lumbar spine without acute infectious type findings --In ED COVID-19 was negative -UA was not suggestive of UTI -WBCs 5.8 -In ED BP was low, improved with IV fluids -Lactic acid initially elevated at 2.0 down to 0.9 after IV fluids  chest x-ray not suggestive of pneumonia -Continue IV cefepime and vancomycin pending blood cultures given history of IVDU-blood and urine cultures NGTD -Very low threshold for echocardiogram if fever persists or if positive blood cultures given history of IVDU  2)HTN-BP stable,  continue to hold amlodipine, hold lisinopril,  hold clonidine,   give Toprol-XL 50 mg daily with hold parameters  3) DM2--- A1c was 9.7 on 01/10/2019, reflecting uncontrolled DM ,  -Oral intake is not great so sugars have been a bit erratic reduced NPH insulin to 10 units twice daily with meals along with sliding scale coverage  4) chronic pain syndrome/opiate dependence----previously on Subutex -be very judicious with opiates -Continue methocarbamol 750 mg 3 times daily,gabapentin to 300 mg 3 times daily, as well as hydroxyzine  5)Elevated LFTs--- patient with known hep B,--  AST of 65 and ALT of 47 and total bili of 1.5 (all of  which are around patient's usual baseline)  Disposition/Need for in-Hospital Stay- patient unable to be discharged at this time due to -requiring IV antibiotics pending culture results -Possible discharge home on 02/06/2019 if remains afebrile and if cultures remains negative  Code Status : Full  Family Communication:   (patient is alert, awake and coherent) -Discussed with significant other at bedside  Disposition Plan  : TBD  Consults  :  Na  DVT Prophylaxis  :    -  SCDs (low platelets)  Lab Results  Component Value Date   PLT 35 (L) 02/04/2019   Inpatient Medications  Scheduled Meds: . gabapentin  300 mg Oral TID  . hydrOXYzine  25 mg Oral TID WC  . hydrOXYzine  50 mg Oral QHS  . insulin aspart  0-5 Units Subcutaneous QHS  . insulin aspart  0-9 Units Subcutaneous TID WC  . insulin aspart  3 Units Subcutaneous TID WC  . insulin NPH Human  10 Units Subcutaneous BID AC & HS  . levETIRAcetam  500 mg Oral BID  . lisinopril  10 mg Oral Daily  . methocarbamol  750 mg Oral QID  . metoprolol succinate  50 mg Oral QHS  . sodium chloride flush  3 mL Intravenous Q12H  . traZODone  100 mg Oral QHS   Continuous Infusions: . sodium chloride 250 mL (02/04/19 0541)  . sodium chloride 100 mL/hr at 02/05/19 0640  . ceFEPime (MAXIPIME) IV 2 g (02/05/19 1507)  . vancomycin 1,000 mg (02/05/19 [id number])    PRN Meds:.sodium chloride, acetaminophen **OR** acetaminophen, albuterol, ondansetron **OR** ondansetron (ZOFRAN) IV, polyethylene glycol, sodium chloride flush, traZODone    Anti-infectives (From admission, onward)   Start     Dose/Rate Route Frequency Ordered Stop   02/04/19 2200  vancomycin (VANCOCIN) 1,250 mg in sodium chloride 0.9 % 250 mL IVPB  Status:  Discontinued     1,250 mg 166.7 mL/hr over 90 Minutes Intravenous Every 36 hours 02/03/19 0712 02/03/19 0715   02/04/19 1400  ceFEPIme (MAXIPIME) 2 g in sodium chloride 0.9 % 100 mL IVPB     2 g 200 mL/hr over 30 Minutes Intravenous Every 8 hours 02/04/19 0837     02/04/19 0600  vancomycin (VANCOCIN) IVPB 1000 mg/200 mL premix     1,000 mg 200 mL/hr over 60 Minutes Intravenous Every 24 hours 02/03/19 0715     02/03/19 1800  ceFEPIme (MAXIPIME) 2 g in sodium chloride 0.9 % 100 mL IVPB  Status:  Discontinued     2 g 200 mL/hr over 30 Minutes Intravenous Every 12 hours 02/03/19 0712 02/04/19 0837   02/03/19 0430  ceFEPIme (MAXIPIME) 2 g in sodium chloride 0.9 % 100 mL IVPB     2 g 200 mL/hr over 30 Minutes Intravenous  Once 02/03/19 0423 02/03/19 0530   02/03/19 0430  metroNIDAZOLE (FLAGYL) IVPB 500 mg     500 mg 100 mL/hr over 60 Minutes Intravenous  Once 02/03/19 0423 02/03/19 0704   02/03/19 0430  vancomycin (VANCOCIN) IVPB 1000 mg/200 mL premix     1,000 mg 200 mL/hr over 60 Minutes Intravenous  Once 02/03/19 0423 02/03/19 0703        Objective:   Vitals:   02/04/19 2131 02/04/19 2155 02/05/19 0556 02/05/19 1426  BP: 124/73  112/64 136/74  Pulse: 73  66 69  Resp: 18  18 18   Temp:  98.7 F (37.1 C) 98 F (36.7 C) 98.7 F (37.1 C)  TempSrc:  Oral Oral   SpO2: 97%  94% 98%  Weight:      Height:        Wt Readings from Last 3 Encounters:  02/03/19 67.4 kg  01/10/19 64 kg  12/23/18 68 kg     Intake/Output Summary (Last 24 hours) at 02/05/2019 1610 Last data filed at 02/05/2019 1100 Gross per 24 hour  Intake  60 ml  Output 900 ml  Net -840 ml     Physical Exam  Gen:- Awake Alert,  In no apparent distress  HEENT:- [place].AT, No sclera icterus Neck-Supple Neck,No JVD,.  Lungs-  CTAB , fair symmetrical air movement CV- S1, S2 normal, regular  Abd-  +ve B.Sounds, Abd  Soft, No tenderness,    Extremity/Skin:- No  edema, pedal pulses present  Psych-affect is appropriate, oriented x3 Neuro-no new focal deficits, no tremors   Data Review:   Micro Results Recent Results (from the past 240 hour(s))  Urine culture     Status: None   Collection Time: 02/03/19  4:23 AM   Specimen: In/Out Cath Urine  Result Value Ref Range Status   Specimen Description   Final    IN/OUT CATH URINE Performed at [facility], [address] [place], [place] [address]    Special Requests   Final    NONE Performed at [facility] [address], [place], [place] [address]    Culture   Final    NO GROWTH Performed at [facility], [place] [address], [place], [place] [address]    Report Status 02/04/2019 FINAL  Final  SARS CORONAVIRUS 2 (TAT 6-24 HRS) Nasopharyngeal Nasopharyngeal Swab     Status: None   Collection Time: 02/03/19  4:31 AM   Specimen: Nasopharyngeal Swab  Result Value Ref Range Status   SARS Coronavirus 2 NEGATIVE NEGATIVE Final    Comment: (NOTE) SARS-CoV-2 target nucleic acids are NOT DETECTED. The SARS-CoV-2 RNA is generally detectable in upper and lower respiratory specimens during the acute phase of infection. Negative results do not preclude SARS-CoV-2 infection, do not rule out co-infections with other pathogens, and should not be used as the sole basis for treatment or other patient management decisions. Negative results must be combined with clinical observations, patient history, and epidemiological information. The expected result is Negative. Fact Sheet for Patients: SugarRoll.be Fact Sheet for Healthcare Providers:  https://www.woods-mathews.com/ This test is not yet approved or cleared by the [place] FDA and  has been authorized for detection and/or diagnosis of SARS-CoV-2 by FDA under an Emergency Use Authorization (EUA). This EUA will remain  in effect (meaning this test can be used) for the duration of the COVID-19 declaration under Section 56 4(b)(1) of the Act, 21 U.S.C. section 360bbb-3(b)(1), unless the authorization is terminated or revoked sooner. Performed at [facility], [place] [address], [place], [place] [address]   Blood Culture (routine x 2)     Status: None (Preliminary result)   Collection Time: 02/03/19  5:19 AM   Specimen: BLOOD  Result Value Ref Range Status   Specimen Description BLOOD DRAWN BY IV THERAPY LEFT WRIST  Final   Special Requests   Final    BOTTLES DRAWN AEROBIC AND ANAEROBIC Blood Culture adequate volume   Culture   Final    NO GROWTH 2 DAYS Performed at [facility], [address], [place], [place] [address]    Report Status PENDING  Incomplete  Blood Culture (routine x 2)     Status: None (Preliminary result)   Collection Time: 02/03/19  5:36 AM   Specimen: BLOOD  Result Value Ref Range Status   Specimen Description BLOOD RIGHT HAND  Final   Special Requests   Final    BOTTLES DRAWN AEROBIC AND ANAEROBIC Blood Culture adequate volume   Culture   Final    NO GROWTH 2 DAYS Performed at [facility], [address], [place], [place] [address]    Report Status PENDING  Incomplete    Radiology Reports Mr Thoracic Spine W Wo Contrast  Result Date: 02/03/2019 CLINICAL DATA:  Back pain and fever. Intravenous drug use. EXAM: MRI THORACIC WITHOUT AND WITH CONTRAST TECHNIQUE: Multiplanar and multiecho pulse sequences of the thoracic spine were  obtained without and with intravenous contrast. CONTRAST:  85mL GADAVIST GADOBUTROL 1 MMOL/ML IV SOLN COMPARISON:  CT scan of the thoracic spine dated 12/22/2018 FINDINGS: MRI THORACIC SPINE FINDINGS  Alignment:  Physiologic. Vertebrae: No fracture, evidence of discitis, or bone lesion. Cord:  Normal signal and morphology. Paraspinal and other soft tissues: Negative. Disc levels: Previous anterior fusion at C5-6 and C6-7. C7-T1 through T6-7: No disc bulging or protrusion. No spinal or foraminal stenosis. T7-8: Small disc protrusion to the left of midline. Small disc bulge to the right of midline. No impingement upon the spinal cord. T8-9: Small disc bulges to the right and left of midline without neural impingement. T9-10: Negative. T10-11: Slight hypertrophy of the left ligamentum flavum. No neural impingement. T11-12: Negative. T12-L1: Slight degenerative changes of the facet joints. Normal disc. After contrast administration there is no pathologic enhancement in the thoracic spine or in the adjacent soft tissues. IMPRESSION: 1. No significant abnormality of the thoracic spine. Specifically, no evidence of infection. 2. Slight degenerative changes of the facet joints at T12-L1. 3. Small disc protrusion to the left of midline at T7-8 without neural impingement. Electronically Signed   By: Lorriane Shire M.D.   On: 02/03/2019 10:30   Mr Lumbar Spine W Wo Contrast  Addendum Date: 02/03/2019   ADDENDUM REPORT: 02/03/2019 10:51 ADDENDUM: Impression #5 should read: Mild to moderate spinal and bilateral lateral recess stenosis at L3-4, right greater than left. Electronically Signed   By: Marijo Sanes M.D.   On: 02/03/2019 10:51   Result Date: 02/03/2019 CLINICAL DATA:  Sepsis, fever and back pain. Evaluate for discitis/osteomyelitis or epidural abscess. EXAM: MRI THORACIC AND LUMBAR SPINE WITHOUT AND WITH CONTRAST TECHNIQUE: Multiplanar and multiecho pulse sequences of the thoracic and lumbar spine were obtained without and with intravenous contrast. CONTRAST:  63mL GADAVIST GADOBUTROL 1 MMOL/ML IV SOLN COMPARISON:  CT scan 12/22/2018 FINDINGS: MRI THORACIC SPINE FINDINGS Alignment:  Normal Vertebrae: Normal  marrow signal. No bone lesions or fractures. No findings suspicious for septic arthritis or osteomyelitis. T8 and L1 hemangiomas are noted. Cord: Normal appearance of the thoracic spinal cord. No cord lesions or syrinx. Paraspinal and other soft tissues: No significant paraspinal or mediastinal abnormalities. The lungs demonstrate atelectasis or scarring changes but no obvious lesions or pleural effusions. Disc levels: Left paracentral disc protrusion at T7-8 with mass effect on the left side of the thecal sac. Shallow disc protrusion and spurring at T8-9 with mass effect on both sides of the thecal sac. The other intervertebral disc spaces are unremarkable. No findings for discitis, osteomyelitis or epidural abscess. MRI LUMBAR SPINE FINDINGS Segmentation: There are five lumbar type vertebral bodies. The last full intervertebral disc space is labeled L5-S1. Alignment: Straightening of the normal lumbar lordosis unchanged since prior CT scan. Vertebrae: L1 and L4 hemangiomas are noted. No bone lesions or fractures. No findings for discitis or osteomyelitis. L4-5 fusion changes are noted. Conus medullaris: Extends to the L1 level and appears normal. Paraspinal and other soft tissues: No significant paraspinal or retroperitoneal findings. Disc levels: L1-2: No significant findings.  Moderate facet disease. L2-3: Diffuse bulging annulus, osteophytic ridging and facet disease with a large left-sided facet spur on the left. These findings contribute to severe left lateral recess stenosis with significant impingement on the left L3 nerve root. There is also moderately severe spinal stenosis. No significant right lateral recess stenosis and no foraminal stenosis. L3-4: Diffuse bulging annulus, osteophytic ridging and advanced facet disease contributing to mild to moderate spinal  and bilateral lateral recess stenosis, left greater than right. No significant foraminal stenosis. L4-5: Interbody and posterior fusion changes.  No significant spinal or foraminal stenosis. L5-S1: No significant findings. IMPRESSION: 1. No findings for discitis or osteomyelitis involving the thoracic or lumbar spine. 2. Thoracic disc protrusions at T7-8 and T8-9. 3. Degenerative lumbar spondylosis with multilevel disc disease and facet disease. 4. The most significant findings in the lumbar spine are at L2-3 with there is moderately severe spinal stenosis and severe left lateral recess stenosis. 5. Mild to moderate spinal and bilateral lateral recesses abscess at L3-4, left greater than right. Electronically Signed: By: Marijo Sanes M.D. On: 02/03/2019 09:47   Dg Chest Port 1 View  Result Date: 02/03/2019 CLINICAL DATA:  Code sepsis with fever and back pain EXAM: PORTABLE CHEST 1 VIEW COMPARISON:  Chest CT 12/22/2018 FINDINGS: Normal heart size and mediastinal contours. No acute infiltrate or edema. No effusion or pneumothorax. No acute osseous findings. IMPRESSION: Negative chest. Electronically Signed   By: Monte Fantasia M.D.   On: 02/03/2019 05:06   Ct Renal Stone Study  Result Date: 02/03/2019 CLINICAL DATA:  Sepsis and back pain.  IV drug abuse EXAM: CT ABDOMEN AND PELVIS WITHOUT CONTRAST TECHNIQUE: Multidetector CT imaging of the abdomen and pelvis was performed following the standard protocol without IV contrast. COMPARISON:  12/22/2018 FINDINGS: Lower chest: Lobulation along the lower esophagus from varices. Coronary calcification. Hepatobiliary: Cirrhosis. No acute or focal hepatic finding.Full gallbladder with 2 tiny calcified stones. No pericholecystic inflammatory changes. Pancreas: Generalized atrophy Spleen: Enlarged (18 cm craniocaudal), homogeneous spleen attributed to portal hypertension. Adrenals/Urinary Tract: Negative adrenals. No hydronephrosis or stone. Unremarkable bladder. Stomach/Bowel:  No obstruction. No evidence of bowel inflammation Vascular/Lymphatic: No acute vascular abnormality. Minimal fat haziness along the  proximal IMA is stable and presumably scarring. Scattered atherosclerotic calcifications. No mass or adenopathy. Reproductive:Negative Other: No ascites or pneumoperitoneum. Musculoskeletal: Degenerative straightening of the back with L4-5 and L5-S1 fusion. L2-3 and L3-4 disc and facet degeneration with prominent left-sided spur at L2-3 causing implied impingement. IMPRESSION: 1. No acute finding. 2. Cirrhosis with portal hypertension and varices. 3. Cholelithiasis. 4.  Aortic Atherosclerosis (ICD10-I70.0). Electronically Signed   By: Monte Fantasia M.D.   On: 02/03/2019 06:45     CBC Recent Labs  Lab 02/03/19 0519 02/04/19 0716  WBC 5.8 2.3*  HGB 10.7* 9.4*  HCT 32.1* 28.6*  PLT 46* 35*  MCV 94.1 97.3  MCH 31.4 32.0  MCHC 33.3 32.9  RDW 15.1 15.7*  LYMPHSABS 0.3*  --   MONOABS 0.4  --   EOSABS 0.0  --   BASOSABS 0.0  --     Chemistries  Recent Labs  Lab 02/03/19 0519 02/04/19 0716  NA 133* 139  K 3.1* 3.2*  CL 104 110  CO2 23 22  GLUCOSE 147* 188*  BUN 14 17  CREATININE 0.42* 0.46  CALCIUM 8.4* 7.9*  AST 65*  --   ALT 47*  --   ALKPHOS 109  --   BILITOT 1.5*  --    ------------------------------------------------------------------------------------------------------------------ No results for input(s): CHOL, HDL, LDLCALC, TRIG, CHOLHDL, LDLDIRECT in the last 72 hours.  Lab Results  Component Value Date   HGBA1C 9.7 (H) 01/10/2019   ------------------------------------------------------------------------------------------------------------------ No results for input(s): TSH, T4TOTAL, T3FREE, THYROIDAB in the last 72 hours.  Invalid input(s): FREET3 ------------------------------------------------------------------------------------------------------------------ No results for input(s): VITAMINB12, FOLATE, FERRITIN, TIBC, IRON, RETICCTPCT in the last 72 hours.  Coagulation profile Recent Labs  Lab 02/03/19 0519  INR 1.4*    No results for input(s): DDIMER  in the last 72 hours.  Cardiac Enzymes No results for input(s): CKMB, TROPONINI, MYOGLOBIN in the last 168 hours.  Invalid input(s): CK ------------------------------------------------------------------------------------------------------------------ No results found for: BNP   Roxan Hockey M.D on 02/05/2019 at 4:10 PM  Go to [email or web address] - for contact info  [facility] - Office  [phone]

## 2019-02-06 LAB — GLUCOSE, CAPILLARY
Glucose-Capillary: 107 mg/dL — ABNORMAL HIGH (ref 70–99)
Glucose-Capillary: 109 mg/dL — ABNORMAL HIGH (ref 70–99)

## 2019-02-06 LAB — COMPREHENSIVE METABOLIC PANEL
ALT: 40 U/L (ref 0–44)
AST: 62 U/L — ABNORMAL HIGH (ref 15–41)
Albumin: 2 g/dL — ABNORMAL LOW (ref 3.5–5.0)
Alkaline Phosphatase: 55 U/L (ref 38–126)
Anion gap: 6 (ref 5–15)
BUN: 10 mg/dL (ref 8–23)
CO2: 22 mmol/L (ref 22–32)
Calcium: 8.3 mg/dL — ABNORMAL LOW (ref 8.9–10.3)
Chloride: 114 mmol/L — ABNORMAL HIGH (ref 98–111)
Creatinine, Ser: 0.49 mg/dL (ref 0.44–1.00)
GFR calc Af Amer: >60 mL/min (ref >60)
GFR calc non Af Amer: >60 mL/min (ref >60)
Glucose, Bld: 89 mg/dL (ref 70–99)
Potassium: 3.6 mmol/L (ref 3.5–5.1)
Sodium: 142 mmol/L (ref 135–145)
Total Bilirubin: 1.2 mg/dL (ref 0.3–1.2)
Total Protein: 6.5 g/dL (ref 6.5–8.1)

## 2019-02-06 LAB — CBC
HCT: 32.4 % — ABNORMAL LOW (ref 36.0–46.0)
Hemoglobin: 10.2 g/dL — ABNORMAL LOW (ref 12.0–15.0)
MCH: 31 pg (ref 26.0–34.0)
MCHC: 31.5 g/dL (ref 30.0–36.0)
MCV: 98.5 fL (ref 80.0–100.0)
Platelets: 37 10*3/uL — ABNORMAL LOW (ref 150–400)
RBC: 3.29 MIL/uL — ABNORMAL LOW (ref 3.87–5.11)
RDW: 15.2 % (ref 11.5–15.5)
WBC: 2 10*3/uL — ABNORMAL LOW (ref 4.0–10.5)
nRBC: 0 % (ref 0.0–0.2)

## 2019-02-06 MED ORDER — CEPHALEXIN 500 MG PO CAPS: 500.0000 mg | ORAL_CAPSULE | Freq: Three times a day (TID) | ORAL | 0 refills | Status: AC

## 2019-02-06 MED ORDER — TRAZODONE HCL 100 MG PO TABS: 100.0000 mg | ORAL_TABLET | Freq: Every day | ORAL | 5 refills | Status: DC

## 2019-02-06 MED ORDER — METOPROLOL SUCCINATE ER 50 MG PO TB24: 50.0000 mg | ORAL_TABLET | Freq: Every day | ORAL | 3 refills | Status: DC

## 2019-02-06 MED ORDER — GABAPENTIN 300 MG PO CAPS: 300.0000 mg | ORAL_CAPSULE | Freq: Three times a day (TID) | ORAL | 5 refills | Status: DC

## 2019-02-06 MED ORDER — LISINOPRIL 10 MG PO TABS: 10.0000 mg | ORAL_TABLET | Freq: Every day | ORAL | 3 refills | Status: DC

## 2019-02-06 MED ORDER — AMLODIPINE BESYLATE 10 MG PO TABS: 10.0000 mg | ORAL_TABLET | Freq: Every day | ORAL | 5 refills | Status: DC

## 2019-02-06 MED ORDER — ACETAMINOPHEN 325 MG PO TABS: 650.0000 mg | ORAL_TABLET | Freq: Four times a day (QID) | ORAL | 0 refills | Status: DC | PRN

## 2019-02-06 MED ORDER — DOXYCYCLINE HYCLATE 100 MG PO TABS: 100.0000 mg | ORAL_TABLET | Freq: Two times a day (BID) | ORAL | 0 refills | Status: AC

## 2019-02-06 NOTE — Discharge Instructions (Signed)
1) complete abstinence from street drugs including heroin advised 2) take medications as prescribed 3) call or return if fevers, chills, night sweats, or other concerns 4) follow-up with your regular doctor in 1 to 2 weeks for recheck and reevaluation

## 2019-02-06 NOTE — Care Management Important Message (Signed)
Important Message  Patient Details  Name: Grace Stokes MRN: [id number] Date of Birth: [birth year]   Medicare Important Message Given:  Yes     Tommy Medal 02/06/2019, 1:27 PM
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
## 2019-02-06 NOTE — Progress Notes (Signed)
Nsg Discharge Note  Admit Date:  02/03/2019 Discharge date: 02/06/2019   Grace Stokes to be D/C'd Home per MD order.  AVS completed.  Patient/caregiver able to verbalize understanding.  Discharge Medication: Allergies as of 02/06/2019   No Known Allergies     Medication List    STOP taking these medications   cloNIDine 0.1 MG tablet Commonly known as: Catapres     TAKE these medications   acetaminophen 325 MG tablet Commonly known as: TYLENOL Take 2 tablets (650 mg total) by mouth every 6 (six) hours as needed for mild pain (or Fever >/= 101).   amLODipine 10 MG tablet Commonly known as: NORVASC Take 1 tablet (10 mg total) by mouth daily. For blood pressure   blood glucose meter kit and supplies Kit Dispense based on patient and insurance preference. Use up to four times daily as directed.   cephALEXin 500 MG capsule Commonly known as: Keflex Take 1 capsule (500 mg total) by mouth 3 (three) times daily for 5 days.   cyclobenzaprine 10 MG tablet Commonly known as: FLEXERIL Take 1 tablet (10 mg total) by mouth 2 (two) times daily as needed for muscle spasms.   doxycycline 100 MG tablet Commonly known as: VIBRA-TABS Take 1 tablet (100 mg total) by mouth 2 (two) times daily for 5 days.   gabapentin 300 MG capsule Commonly known as: NEURONTIN Take 1 capsule (300 mg total) by mouth 3 (three) times daily. What changed:   medication strength  how much to take  when to take this   insulin NPH Human 100 UNIT/ML injection Commonly known as: NovoLIN N ReliOn Inject 0.18 mLs (18 Units total) into the skin daily before breakfast AND 0.32 mLs (32 Units total) daily with supper.   Insulin Syringe-Needle U-100 29G X 1/2" 0.3 ML Misc Commonly known as: SAFETY-GLIDE 0.3CC SYR 29GX1/2 Use as directed, 2 times daily with meals.   levETIRAcetam 500 MG tablet Commonly known as: Keppra Take 1 tablet (500 mg total) by mouth 2 (two) times daily for 6 days.   lisinopril 10  MG tablet Commonly known as: ZESTRIL Take 1 tablet (10 mg total) by mouth daily. For kidney protection   metoprolol succinate 50 MG 24 hr tablet Commonly known as: TOPROL-XL Take 1 tablet (50 mg total) by mouth daily. For heart and blood pressure   traZODone 100 MG tablet Commonly known as: DESYREL Take 1 tablet (100 mg total) by mouth at bedtime.   TRUEplus Lancets 30G Misc Check blood sugar up to 4 times a day       Discharge Assessment: Vitals:   02/06/19 0513 02/06/19 0940  BP: (!) 118/59 (!) 146/80  Pulse: (!) 57   Resp: 17   Temp: 98.8 F (37.1 C)   SpO2: 93%    Skin clean, dry and intact without evidence of skin break down, no evidence of skin tears noted. IV catheter discontinued intact. Site without signs and symptoms of complications - no redness or edema noted at insertion site, patient denies c/o pain - only slight tenderness at site.  Dressing with slight pressure applied.  D/c Instructions-Education: Discharge instructions given to patient/family with verbalized understanding. D/c education completed with patient/family including follow up instructions, medication list, d/c activities limitations if indicated, with other d/c instructions as indicated by MD - patient able to verbalize understanding, all questions fully answered. Patient instructed to return to ED, call 911, or call MD for any changes in condition.  Patient escorted via [place], and  D/C home via private auto.  Berton Bon, RN 02/06/2019 2:47 PM

## 2019-02-06 NOTE — Discharge Summary (Signed)
Grace Stokes, is a 63 y.o. female  DOB [birth year]  MRN [id number].  Admission date:  02/03/2019  Admitting Physician  Roxan Hockey, MD  Discharge Date:  02/06/2019   Primary MD  Claretta Fraise, MD  Recommendations for primary care physician for things to follow:   - 1) complete abstinence from street drugs including heroin advised 2) take medications as prescribed 3) call or return if fevers, chills, night sweats, or other concerns 4) follow-up with your regular doctor in 1 to 2 weeks for recheck and reevaluation  Admission Diagnosis  Hypokalemia [E87.6] Sepsis with encephalopathy without septic shock, due to unspecified organism ([place]) [A41.9, R65.20, G93.40]   Discharge Diagnosis  Hypokalemia [E87.6] Sepsis with encephalopathy without septic shock, due to unspecified organism ([place]) [A41.9, R65.20, G93.40]    Principal Problem:   Fever Active Problems:   Cervical spondylosis with myelopathy   Opiate addiction ([place])   Heroin abuse ([place])   HTN (hypertension)   Type 2 diabetes mellitus without complication, with long-term current use of insulin ([place])   Chronic active viral hepatitis B ([place])   Transaminitis   Hepatitis B      Past Medical History:  Diagnosis Date   Arthritis    Bell's palsy    Cataract    Chronic pain    Renal disorder    Scoliosis    Toxemia in pregnancy     Past Surgical History:  Procedure Laterality Date   ANTERIOR CERVICAL DECOMP/DISCECTOMY FUSION N/A 07/09/2013   Procedure: ANTERIOR CERVICAL DECOMPRESSION/DISCECTOMY FUSION 2 LEVELS;  Surgeon: Consuella Lose, MD;  Location: [facility];  Service: Neurosurgery;  Laterality: N/A;  Anterior Cervical Fusion Cervical five-six, six-seven.   BACK SURGERY     CARPAL TUNNEL RELEASE     CESAREAN SECTION     I&D EXTREMITY Left 12/17/2016   Procedure: IRRIGATION AND DEBRIDEMENT LEFT HAND;  Surgeon:  Iran Planas, MD;  Location: [facility];  Service: Orthopedics;  Laterality: Left;   INCISION / DRAINAGE HAND / FINGER Left 12/17/2016   ROTATOR CUFF REPAIR     SHOULDER SURGERY     TUBAL LIGATION         HPI  from the history and physical done on the day of admission:    Grace Stokes  is a 63 y.o. female with PMHx of chronic back pain, polysubstance abuse including IV heroin (recently on Suboxone), h/o  diabetes, hepatitis B presents with back pain and fever.  - -Currently patient has fevers with temp up to 103, some urinary concerns and persistent worsening back pain - -In ED COVID-19 was negative -UA was not suggestive of UTI -WBCs 5.8 -In ED BP was low, improved with IV fluids -Lactic acid initially elevated at 2.0 down to 0.9 after IV fluids  -Elevated LFTs noted with AST of 65 and ALT of 47 and total bili of 1.5 (all of  which are around patient's usual baseline) - -MRI of the thoracic and lumbar spine without evidence of infection -CT abdomen/renal stone protocol without  acute intra-abdominal abnormality -Chest x-ray without pneumonia or other acute findings  -EDP obtained blood cultures and give antibiotics given patient's history of IVDU now with high fevers     Hospital Course:     -Brief Summary 63 y.o.femalewith PMHx ofchronic back pain, polysubstance abuse including IV heroin last used 4 days PTA, (recently on Suboxone), h/odiabetes, hepatitis B admitted on 02/03/2019 with back pain and fever.  A/p 1)Feverwith concerns for possible bacteremia  In a patient with history of IVDU--- last IV heroin use according the patient was 4 days PTA,  -CT abdomen with stone protocol without acute findings except for cholelithiasis, MRI of thoracic and lumbar spine without acute infectious type findings --In ED COVID-19 was negative -UA was not suggestive of UTI -WBCs 5.8 -In ED BP was low, improved with IV fluids -Lactic acid initially elevated at 2.0 down to  0.9 after IV fluids chest x-ray not suggestive of pneumonia -Continue IV cefepime and vancomycin pending blood cultures given history of IVDU-blood and urine cultures NGTD -Discussed with on-call infectious disease specialist Dr. Megan Salon, patient remains afebrile and cultures NGTD -Okay to discharge home on oral antibiotics-Doxy and Keflex  2)HTN-BP stable,   continue amlodipine, lisinopril and metoprolol  3)DM2---A1c was 9.7 on 01/10/2019, reflecting uncontrolled DM , -Insulin regimen as ordered -Compliance with diet, lifestyle and insulin strongly advised  4)chronic pain syndrome/opiate dependence----previously on Subutex -Continue methocarbamol 750 mg 3 times daily,gabapentin to 300 mg 3 times daily, as well as hydroxyzine  5)Elevated LFTs---patient with known hep B,--AST of 65 and ALT of 47 and total bili of 1.5 (all ofwhich are around patient's usual baseline)  Disposition/Need for in-Hospital Stay- patient unable to be discharged at this time due to -requiring IV antibiotics pending culture results -Possible discharge home on 02/06/2019 if remains afebrile and if cultures remains negative  Code Status : Full  Family Communication:   (patient is alert, awake and coherent) -Discussed with significant other at bedside  Disposition Plan  : TBD  Discharge Condition: stable  Follow UP  Follow-up Information    [facility] Follow up on 02/16/2019.   Why: Please follow up with Dr. Livia Snellen on Thursday, November 5th at 3:10pm. Contact information: [address] [place] [address] [phone]         Diet and Activity recommendation:  As advised  Discharge Instructions    Discharge Instructions    Call MD for:  difficulty breathing, headache or visual disturbances   Complete by: As directed    Call MD for:  extreme fatigue   Complete by: As directed    Call MD for:  persistant dizziness or light-headedness    Complete by: As directed    Call MD for:  persistant nausea and vomiting   Complete by: As directed    Call MD for:  severe uncontrolled pain   Complete by: As directed    Call MD for:  temperature >100.4   Complete by: As directed    Diet - low sodium heart healthy   Complete by: As directed    Discharge instructions   Complete by: As directed    1) complete abstinence from street drugs including heroin advised 2) take medications as prescribed 3) call or return if fevers, chills, night sweats, or other concerns 4) follow-up with your regular doctor in 1 to 2 weeks for recheck and reevaluation   Increase activity slowly   Complete by: As directed  Discharge Medications     Allergies as of 02/06/2019   No Known Allergies     Medication List    STOP taking these medications   cloNIDine 0.1 MG tablet Commonly known as: Catapres     TAKE these medications   acetaminophen 325 MG tablet Commonly known as: TYLENOL Take 2 tablets (650 mg total) by mouth every 6 (six) hours as needed for mild pain (or Fever >/= 101).   amLODipine 10 MG tablet Commonly known as: NORVASC Take 1 tablet (10 mg total) by mouth daily. For blood pressure   blood glucose meter kit and supplies Kit Dispense based on patient and insurance preference. Use up to four times daily as directed.   cephALEXin 500 MG capsule Commonly known as: Keflex Take 1 capsule (500 mg total) by mouth 3 (three) times daily for 5 days.   cyclobenzaprine 10 MG tablet Commonly known as: FLEXERIL Take 1 tablet (10 mg total) by mouth 2 (two) times daily as needed for muscle spasms.   doxycycline 100 MG tablet Commonly known as: VIBRA-TABS Take 1 tablet (100 mg total) by mouth 2 (two) times daily for 5 days.   gabapentin 300 MG capsule Commonly known as: NEURONTIN Take 1 capsule (300 mg total) by mouth 3 (three) times daily. What changed:   medication strength  how much to take  when to take this     insulin NPH Human 100 UNIT/ML injection Commonly known as: NovoLIN N ReliOn Inject 0.18 mLs (18 Units total) into the skin daily before breakfast AND 0.32 mLs (32 Units total) daily with supper.   Insulin Syringe-Needle U-100 29G X 1/2" 0.3 ML Misc Commonly known as: SAFETY-GLIDE 0.3CC SYR 29GX1/2 Use as directed, 2 times daily with meals.   levETIRAcetam 500 MG tablet Commonly known as: Keppra Take 1 tablet (500 mg total) by mouth 2 (two) times daily for 6 days.   lisinopril 10 MG tablet Commonly known as: ZESTRIL Take 1 tablet (10 mg total) by mouth daily. For kidney protection   metoprolol succinate 50 MG 24 hr tablet Commonly known as: TOPROL-XL Take 1 tablet (50 mg total) by mouth daily. For heart and blood pressure   traZODone 100 MG tablet Commonly known as: DESYREL Take 1 tablet (100 mg total) by mouth at bedtime.   TRUEplus Lancets 30G Misc Check blood sugar up to 4 times a day      Major procedures and Radiology Reports - PLEASE review detailed and final reports for all details, in brief -   Mr Thoracic Spine W Wo Contrast  Result Date: 02/03/2019 CLINICAL DATA:  Back pain and fever. Intravenous drug use. EXAM: MRI THORACIC WITHOUT AND WITH CONTRAST TECHNIQUE: Multiplanar and multiecho pulse sequences of the thoracic spine were obtained without and with intravenous contrast. CONTRAST:  46m GADAVIST GADOBUTROL 1 MMOL/ML IV SOLN COMPARISON:  CT scan of the thoracic spine dated 12/22/2018 FINDINGS: MRI THORACIC SPINE FINDINGS Alignment:  Physiologic. Vertebrae: No fracture, evidence of discitis, or bone lesion. Cord:  Normal signal and morphology. Paraspinal and other soft tissues: Negative. Disc levels: Previous anterior fusion at C5-6 and C6-7. C7-T1 through T6-7: No disc bulging or protrusion. No spinal or foraminal stenosis. T7-8: Small disc protrusion to the left of midline. Small disc bulge to the right of midline. No impingement upon the spinal cord. T8-9: Small disc  bulges to the right and left of midline without neural impingement. T9-10: Negative. T10-11: Slight hypertrophy of the left ligamentum flavum. No neural impingement. T11-12:  Negative. T12-L1: Slight degenerative changes of the facet joints. Normal disc. After contrast administration there is no pathologic enhancement in the thoracic spine or in the adjacent soft tissues. IMPRESSION: 1. No significant abnormality of the thoracic spine. Specifically, no evidence of infection. 2. Slight degenerative changes of the facet joints at T12-L1. 3. Small disc protrusion to the left of midline at T7-8 without neural impingement. Electronically Signed   By: Lorriane Shire M.D.   On: 02/03/2019 10:30   Mr Lumbar Spine W Wo Contrast  Addendum Date: 02/03/2019   ADDENDUM REPORT: 02/03/2019 10:51 ADDENDUM: Impression #5 should read: Mild to moderate spinal and bilateral lateral recess stenosis at L3-4, right greater than left. Electronically Signed   By: Marijo Sanes M.D.   On: 02/03/2019 10:51   Result Date: 02/03/2019 CLINICAL DATA:  Sepsis, fever and back pain. Evaluate for discitis/osteomyelitis or epidural abscess. EXAM: MRI THORACIC AND LUMBAR SPINE WITHOUT AND WITH CONTRAST TECHNIQUE: Multiplanar and multiecho pulse sequences of the thoracic and lumbar spine were obtained without and with intravenous contrast. CONTRAST:  30m GADAVIST GADOBUTROL 1 MMOL/ML IV SOLN COMPARISON:  CT scan 12/22/2018 FINDINGS: MRI THORACIC SPINE FINDINGS Alignment:  Normal Vertebrae: Normal marrow signal. No bone lesions or fractures. No findings suspicious for septic arthritis or osteomyelitis. T8 and L1 hemangiomas are noted. Cord: Normal appearance of the thoracic spinal cord. No cord lesions or syrinx. Paraspinal and other soft tissues: No significant paraspinal or mediastinal abnormalities. The lungs demonstrate atelectasis or scarring changes but no obvious lesions or pleural effusions. Disc levels: Left paracentral disc protrusion at  T7-8 with mass effect on the left side of the thecal sac. Shallow disc protrusion and spurring at T8-9 with mass effect on both sides of the thecal sac. The other intervertebral disc spaces are unremarkable. No findings for discitis, osteomyelitis or epidural abscess. MRI LUMBAR SPINE FINDINGS Segmentation: There are five lumbar type vertebral bodies. The last full intervertebral disc space is labeled L5-S1. Alignment: Straightening of the normal lumbar lordosis unchanged since prior CT scan. Vertebrae: L1 and L4 hemangiomas are noted. No bone lesions or fractures. No findings for discitis or osteomyelitis. L4-5 fusion changes are noted. Conus medullaris: Extends to the L1 level and appears normal. Paraspinal and other soft tissues: No significant paraspinal or retroperitoneal findings. Disc levels: L1-2: No significant findings.  Moderate facet disease. L2-3: Diffuse bulging annulus, osteophytic ridging and facet disease with a large left-sided facet spur on the left. These findings contribute to severe left lateral recess stenosis with significant impingement on the left L3 nerve root. There is also moderately severe spinal stenosis. No significant right lateral recess stenosis and no foraminal stenosis. L3-4: Diffuse bulging annulus, osteophytic ridging and advanced facet disease contributing to mild to moderate spinal and bilateral lateral recess stenosis, left greater than right. No significant foraminal stenosis. L4-5: Interbody and posterior fusion changes. No significant spinal or foraminal stenosis. L5-S1: No significant findings. IMPRESSION: 1. No findings for discitis or osteomyelitis involving the thoracic or lumbar spine. 2. Thoracic disc protrusions at T7-8 and T8-9. 3. Degenerative lumbar spondylosis with multilevel disc disease and facet disease. 4. The most significant findings in the lumbar spine are at L2-3 with there is moderately severe spinal stenosis and severe left lateral recess stenosis. 5.  Mild to moderate spinal and bilateral lateral recesses abscess at L3-4, left greater than right. Electronically Signed: By: PMarijo SanesM.D. On: 02/03/2019 09:47   Dg Chest Port 1 View  Result Date: 02/03/2019 CLINICAL DATA:  Code sepsis with fever and back pain EXAM: PORTABLE CHEST 1 VIEW COMPARISON:  Chest CT 12/22/2018 FINDINGS: Normal heart size and mediastinal contours. No acute infiltrate or edema. No effusion or pneumothorax. No acute osseous findings. IMPRESSION: Negative chest. Electronically Signed   By: Monte Fantasia M.D.   On: 02/03/2019 05:06   Ct Renal Stone Study  Result Date: 02/03/2019 CLINICAL DATA:  Sepsis and back pain.  IV drug abuse EXAM: CT ABDOMEN AND PELVIS WITHOUT CONTRAST TECHNIQUE: Multidetector CT imaging of the abdomen and pelvis was performed following the standard protocol without IV contrast. COMPARISON:  12/22/2018 FINDINGS: Lower chest: Lobulation along the lower esophagus from varices. Coronary calcification. Hepatobiliary: Cirrhosis. No acute or focal hepatic finding.Full gallbladder with 2 tiny calcified stones. No pericholecystic inflammatory changes. Pancreas: Generalized atrophy Spleen: Enlarged (18 cm craniocaudal), homogeneous spleen attributed to portal hypertension. Adrenals/Urinary Tract: Negative adrenals. No hydronephrosis or stone. Unremarkable bladder. Stomach/Bowel:  No obstruction. No evidence of bowel inflammation Vascular/Lymphatic: No acute vascular abnormality. Minimal fat haziness along the proximal IMA is stable and presumably scarring. Scattered atherosclerotic calcifications. No mass or adenopathy. Reproductive:Negative Other: No ascites or pneumoperitoneum. Musculoskeletal: Degenerative straightening of the back with L4-5 and L5-S1 fusion. L2-3 and L3-4 disc and facet degeneration with prominent left-sided spur at L2-3 causing implied impingement. IMPRESSION: 1. No acute finding. 2. Cirrhosis with portal hypertension and varices. 3.  Cholelithiasis. 4.  Aortic Atherosclerosis (ICD10-I70.0). Electronically Signed   By: Monte Fantasia M.D.   On: 02/03/2019 06:45    Micro Results    Recent Results (from the past 240 hour(s))  Urine culture     Status: None   Collection Time: 02/03/19  4:23 AM   Specimen: In/Out Cath Urine  Result Value Ref Range Status   Specimen Description   Final    IN/OUT CATH URINE Performed at [facility], [address], [place], [place] [address]    Special Requests   Final    NONE Performed at [facility], [address], [place], [place] [address]    Culture   Final    NO GROWTH Performed at [facility], [place] [address], [place], [place] [address]    Report Status 02/04/2019 FINAL  Final  SARS CORONAVIRUS 2 (TAT 6-24 HRS) Nasopharyngeal Nasopharyngeal Swab     Status: None   Collection Time: 02/03/19  4:31 AM   Specimen: Nasopharyngeal Swab  Result Value Ref Range Status   SARS Coronavirus 2 NEGATIVE NEGATIVE Final    Comment: (NOTE) SARS-CoV-2 target nucleic acids are NOT DETECTED. The SARS-CoV-2 RNA is generally detectable in upper and lower respiratory specimens during the acute phase of infection. Negative results do not preclude SARS-CoV-2 infection, do not rule out co-infections with other pathogens, and should not be used as the sole basis for treatment or other patient management decisions. Negative results must be combined with clinical observations, patient history, and epidemiological information. The expected result is Negative. Fact Sheet for Patients: SugarRoll.be Fact Sheet for Healthcare Providers: https://www.woods-mathews.com/ This test is not yet approved or cleared by the [place] FDA and  has been authorized for detection and/or diagnosis of SARS-CoV-2 by FDA under an Emergency Use Authorization (EUA). This EUA will remain  in effect (meaning this test can be used) for the duration of the COVID-19  declaration under Section 56 4(b)(1) of the Act, 21 U.S.C. section 360bbb-3(b)(1), unless the authorization is terminated or revoked sooner. Performed at [facility], [place] [address] [place], [place] [address]  Blood Culture (routine x 2)     Status: None (Preliminary result)   Collection Time: 02/03/19  5:19 AM   Specimen: BLOOD  Result Value Ref Range Status   Specimen Description BLOOD DRAWN BY IV THERAPY LEFT WRIST  Final   Special Requests   Final    BOTTLES DRAWN AEROBIC AND ANAEROBIC Blood Culture adequate volume   Culture   Final    NO GROWTH 3 DAYS Performed at [facility], [address] [place], [place] [address]    Report Status PENDING  Incomplete  Blood Culture (routine x 2)     Status: None (Preliminary result)   Collection Time: 02/03/19  5:36 AM   Specimen: BLOOD  Result Value Ref Range Status   Specimen Description BLOOD RIGHT HAND  Final   Special Requests   Final    BOTTLES DRAWN AEROBIC AND ANAEROBIC Blood Culture adequate volume   Culture   Final    NO GROWTH 3 DAYS Performed at [facility], [address] [place], [place] [address]    Report Status PENDING  Incomplete       Today   Subjective    Grace Stokes today has no New complaints         No fever  Or chills   No Nausea, Vomiting or Diarrhea -Resting comfortably, eager to go home  Patient has been seen and examined prior to discharge   Objective   Blood pressure (!) 146/80, pulse (!) 57, temperature 98.8 F (37.1 C), temperature source Oral, resp. rate 17, height 5' (1.524 m), weight 67.4 kg, last menstrual period 04/06/2011, SpO2 93 %.   Intake/Output Summary (Last 24 hours) at 02/06/2019 1332 Last data filed at 02/05/2019 1700 Gross per 24 hour  Intake 60 ml  Output --  Net 60 ml    Exam Gen:- Awake Alert, no acute distress  HEENT:- [place].AT, No sclera icterus Neck-Supple Neck,No JVD,.  Lungs-  CTAB , good air movement bilaterally  CV- S1, S2 normal,  regular Abd-  +ve B.Sounds, Abd Soft, No tenderness,    Extremity/Skin:- No  edema,   good pulses Psych-affect is appropriate, oriented x3 Neuro-no new focal deficits, no tremors    Data Review   CBC w Diff:  Lab Results  Component Value Date   WBC 2.0 (L) 02/06/2019   HGB 10.2 (L) 02/06/2019   HGB 12.4 06/21/2018   HCT 32.4 (L) 02/06/2019   HCT 36.0 06/21/2018   PLT 37 (L) 02/06/2019   PLT 61 (LL) 06/21/2018   LYMPHOPCT 5 02/03/2019   MONOPCT 6 02/03/2019   EOSPCT 0 02/03/2019   BASOPCT 0 02/03/2019    CMP:  Lab Results  Component Value Date   NA 142 02/06/2019   NA 133 (L) 01/10/2019   K 3.6 02/06/2019   CL 114 (H) 02/06/2019   CO2 22 02/06/2019   BUN 10 02/06/2019   BUN 8 01/10/2019   CREATININE 0.49 02/06/2019   PROT 6.5 02/06/2019   PROT 8.3 01/10/2019   ALBUMIN 2.0 (L) 02/06/2019   ALBUMIN 3.3 (L) 01/10/2019   BILITOT 1.2 02/06/2019   BILITOT 1.4 (H) 01/10/2019   ALKPHOS 55 02/06/2019   AST 62 (H) 02/06/2019   ALT 40 02/06/2019  .   Total Discharge time is about 33 minutes  Roxan Hockey M.D on 02/06/2019 at 1:32 PM  Go to [email or web address] -  for contact info  [facility] - Office  [phone]

## 2019-02-08 LAB — CULTURE, BLOOD (ROUTINE X 2)
Culture: NO GROWTH
Culture: NO GROWTH
Special Requests: ADEQUATE
Special Requests: ADEQUATE

## 2019-02-10 ENCOUNTER — Ambulatory Visit: Payer: Medicare Other | Admitting: Licensed Clinical Social Worker

## 2019-02-10 DIAGNOSIS — F1110 Opioid abuse, uncomplicated: Secondary | ICD-10-CM

## 2019-02-10 DIAGNOSIS — E119 Type 2 diabetes mellitus without complications: Secondary | ICD-10-CM

## 2019-02-10 DIAGNOSIS — I10 Essential (primary) hypertension: Secondary | ICD-10-CM

## 2019-02-10 DIAGNOSIS — E782 Mixed hyperlipidemia: Secondary | ICD-10-CM

## 2019-02-10 DIAGNOSIS — Z794 Long term (current) use of insulin: Secondary | ICD-10-CM

## 2019-02-10 NOTE — Patient Instructions (Addendum)
Licensed Clinical Social Worker Visit Information  Materials Provided: No  I reached out to [employer], spouse, by phone today. LCSW was not able to speak via phone today with Hulda Humphrey or [name]. LCSW did leave phone message for Virjean Brecker and for Xandrea Polce asking for return call to LCSW at [phone] to discuss CCM program services available for Hulda Humphrey.   Follow Up Plan:  LCSW to call Hulda Humphrey or [name] in next 3 weeks to talk with Mariann Laster or Christia Reading about CCM program support services available for [employer].  LCSW was not able to speak via phone today with Hulda Humphrey or [name]. Thus, Adelayde Bestor or [name] were not able to verbalize understanding of instructions provided today and were not able to accept or decline a print copy of patient instruction materials.   Norva Riffle.Dawnita Molner MSW, LCSW Licensed Clinical Social Worker [facility] [phone]

## 2019-02-10 NOTE — Chronic Care Management (AMB) (Signed)
  Care Management Note   Grace Stokes is a 63 y.o. year old female who is a primary care patient of Stacks, Cletus Gash, MD. The CM team was consulted for assistance with chronic disease management and care coordination.   I reached out to [place], spouse, by phone today. LCSW was not able to speak via phone today with Hulda Humphrey or [name]. LCSW did leave phone message for Emireth Cockerham and for Lissy Deuser asking for return call to LCSW at [phone] to discuss CCM program services available for Hulda Humphrey.   Review of patient status, including review of consultants reports, relevant laboratory and other test results, and collaboration with appropriate care team members and the patient's provider was performed as part of comprehensive patient evaluation and provision of chronic care management services.   Medications    acetaminophen (TYLENOL) 325 MG tablet    amLODipine (NORVASC) 10 MG tablet    blood glucose meter kit and supplies KIT    cephALEXin (KEFLEX) 500 MG capsule    cyclobenzaprine (FLEXERIL) 10 MG tablet    doxycycline (VIBRA-TABS) 100 MG tablet    gabapentin (NEURONTIN) 300 MG capsule    insulin NPH Human (NOVOLIN N RELION) 100 UNIT/ML injection    Insulin Syringe-Needle U-100 (SAFETY-GLIDE 0.3CC SYR 29GX1/2) 29G X 1/2" 0.3 ML MISC    levETIRAcetam (KEPPRA) 500 MG tablet(Expired)    lisinopril (ZESTRIL) 10 MG tablet    metoprolol succinate (TOPROL-XL) 50 MG 24 hr tablet    traZODone (DESYREL) 100 MG tablet    TRUEPLUS LANCETS 30G MISC    Follow Up Plan:  LCSW to call Hulda Humphrey or [name] in next 3 weeks to talk with Mariann Laster or Christia Reading about CCM program support services available for [employer].  Norva Riffle.Eulia Hatcher MSW, LCSW Licensed Clinical Social Worker [facility] [phone]

## 2019-02-16 ENCOUNTER — Ambulatory Visit: Payer: Medicare Other | Admitting: Family Medicine

## 2019-03-01 ENCOUNTER — Other Ambulatory Visit: Payer: Self-pay

## 2019-03-02 ENCOUNTER — Ambulatory Visit (INDEPENDENT_AMBULATORY_CARE_PROVIDER_SITE_OTHER): Payer: Medicare Other | Admitting: Family Medicine

## 2019-03-02 ENCOUNTER — Encounter: Payer: Self-pay | Admitting: Family Medicine

## 2019-03-02 VITALS — BP 163/82 | HR 122 | Temp 96.9°F | Ht 60.0 in | Wt 141.0 lb

## 2019-03-02 DIAGNOSIS — G959 Disease of spinal cord, unspecified: Secondary | ICD-10-CM

## 2019-03-02 DIAGNOSIS — I10 Essential (primary) hypertension: Secondary | ICD-10-CM

## 2019-03-02 DIAGNOSIS — D61818 Other pancytopenia: Secondary | ICD-10-CM | POA: Diagnosis not present

## 2019-03-02 DIAGNOSIS — E119 Type 2 diabetes mellitus without complications: Secondary | ICD-10-CM

## 2019-03-02 DIAGNOSIS — Z794 Long term (current) use of insulin: Secondary | ICD-10-CM | POA: Diagnosis not present

## 2019-03-02 MED ORDER — INSULIN NPH (HUMAN) (ISOPHANE) 100 UNIT/ML ~~LOC~~ SUSP: SUBCUTANEOUS | 11 refills | Status: DC

## 2019-03-02 MED ORDER — METOPROLOL SUCCINATE ER 100 MG PO TB24: 100.0000 mg | ORAL_TABLET | Freq: Every day | ORAL | 1 refills | Status: DC

## 2019-03-02 MED ORDER — LISINOPRIL 20 MG PO TABS: 20.0000 mg | ORAL_TABLET | Freq: Every day | ORAL | 1 refills | Status: DC

## 2019-03-02 NOTE — Progress Notes (Signed)
Subjective:  Patient ID: Grace Stokes, female    DOB: [birth year]  Age: 63 y.o. MRN: [id number]  CC: Hospitalization Follow-up   HPI Grace Stokes presents forFollow-up of diabetes. Patient checks blood sugar at home. Brings in log.  140-160 fasting and 140-200 postprandial Patient denies symptoms such as polyuria, polydipsia, excessive hunger, nausea No significant hypoglycemic spells noted. Medications reviewed. Pt reports taking them regularly without complication/adverse reaction being reported today.  Checking feet daily. Last eye appt was remote  History Grace Stokes has a past medical history of Arthritis, Bell's palsy, Cataract, Chronic pain, Renal disorder, Scoliosis, and Toxemia in pregnancy.   She has a past surgical history that includes Back surgery; Cesarean section; Tubal ligation; Shoulder surgery; Rotator cuff repair; Carpal tunnel release; Anterior cervical decomp/discectomy fusion (N/A, 07/09/2013); Incision / drainage hand / finger (Left, 12/17/2016); and I&D extremity (Left, 12/17/2016).   Her family history includes COPD in her mother.She reports that she has been smoking cigarettes. She has been smoking about 0.25 packs per day. She has never used smokeless tobacco. She reports current drug use. She reports that she does not drink alcohol.  Current Outpatient Medications on File Prior to Visit  Medication Sig Dispense Refill  . acetaminophen (TYLENOL) 325 MG tablet Take 2 tablets (650 mg total) by mouth every 6 (six) hours as needed for mild pain (or Fever >/= 101). 12 tablet 0  . amLODipine (NORVASC) 10 MG tablet Take 1 tablet (10 mg total) by mouth daily. For blood pressure 30 tablet 5  . blood glucose meter kit and supplies KIT Dispense based on patient and insurance preference. Use up to four times daily as directed. 1 each 0  . cyclobenzaprine (FLEXERIL) 10 MG tablet Take 1 tablet (10 mg total) by mouth 2 (two) times daily as needed for muscle spasms. 60 tablet 5  .  gabapentin (NEURONTIN) 300 MG capsule Take 1 capsule (300 mg total) by mouth 3 (three) times daily. 90 capsule 5  . Insulin Syringe-Needle U-100 (SAFETY-GLIDE 0.3CC SYR 29GX1/2) 29G X 1/2" 0.3 ML MISC Use as directed, 2 times daily with meals. 100 each 0  . traZODone (DESYREL) 100 MG tablet Take 1 tablet (100 mg total) by mouth at bedtime. 30 tablet 5  . TRUEPLUS LANCETS 30G MISC Check blood sugar up to 4 times a day 200 each 1  . levETIRAcetam (KEPPRA) 500 MG tablet Take 1 tablet (500 mg total) by mouth 2 (two) times daily for 6 days. 12 tablet 0   No current facility-administered medications on file prior to visit.     ROS Review of Systems  Constitutional: Negative.   HENT: Negative.   Eyes: Negative for visual disturbance.  Respiratory: Negative for shortness of breath.   Cardiovascular: Negative for chest pain.  Gastrointestinal: Negative for abdominal pain.  Musculoskeletal: Positive for neck pain (chronic). Negative for arthralgias.    Objective:  BP (!) 163/82   Pulse (!) 122   Temp (!) 96.9 F (36.1 C) (Temporal)   Ht 5' (1.524 m)   Wt 141 lb (64 kg)   LMP 04/06/2011   SpO2 98%   BMI 27.54 kg/m   BP Readings from Last 3 Encounters:  03/02/19 (!) 163/82  02/06/19 (!) 146/80  01/10/19 138/77    Wt Readings from Last 3 Encounters:  03/02/19 141 lb (64 kg)  02/03/19 148 lb 9.4 oz (67.4 kg)  01/10/19 141 lb (64 kg)     Physical Exam Constitutional:  General: She is not in acute distress.    Appearance: She is well-developed.  Cardiovascular:     Rate and Rhythm: Normal rate and regular rhythm.  Pulmonary:     Breath sounds: Normal breath sounds.  Skin:    General: Skin is warm and dry.  Neurological:     Mental Status: She is alert and oriented to person, place, and time.       Assessment & Plan:   Grace Stokes was seen today for hospitalization follow-up.  Diagnoses and all orders for this visit:  Essential hypertension -     CBC -     CMP -      Lipid -     lisinopril (ZESTRIL) 20 MG tablet; Take 1 tablet (20 mg total) by mouth daily. For kidney protection -     metoprolol succinate (TOPROL-XL) 100 MG 24 hr tablet; Take 1 tablet (100 mg total) by mouth daily. For heart and blood pressure  Type 2 diabetes mellitus without complication, with long-term current use of insulin (HCC)  Other pancytopenia (HCC) -     CBC  Cervical myelopathy (HCC) -     CMP  Other orders -     insulin NPH Human (NOVOLIN N RELION) 100 UNIT/ML injection; Inject 0.22 mLs (22 Units total) into the skin daily before breakfast AND 0.36 mLs (36 Units total) daily with supper.      I have changed Grace Stokes. Grace Stokes's lisinopril, metoprolol succinate, and insulin NPH Human. I am also having her maintain her TRUEplus Lancets 30G, Insulin Syringe-Needle U-100, cyclobenzaprine, levETIRAcetam, blood glucose meter kit and supplies, amLODipine, traZODone, gabapentin, and acetaminophen.  Meds ordered this encounter  Medications  . lisinopril (ZESTRIL) 20 MG tablet    Sig: Take 1 tablet (20 mg total) by mouth daily. For kidney protection    Dispense:  90 tablet    Refill:  1  . metoprolol succinate (TOPROL-XL) 100 MG 24 hr tablet    Sig: Take 1 tablet (100 mg total) by mouth daily. For heart and blood pressure    Dispense:  90 tablet    Refill:  1  . insulin NPH Human (NOVOLIN N RELION) 100 UNIT/ML injection    Sig: Inject 0.22 mLs (22 Units total) into the skin daily before breakfast AND 0.36 mLs (36 Units total) daily with supper.    Dispense:  20 mL    Refill:  11     Follow-up: Return in about 6 weeks (around 04/13/2019).  Claretta Fraise, M.D.

## 2019-03-06 ENCOUNTER — Other Ambulatory Visit: Payer: Self-pay | Admitting: Family Medicine

## 2019-03-06 DIAGNOSIS — B181 Chronic viral hepatitis B without delta-agent: Secondary | ICD-10-CM

## 2019-03-07 ENCOUNTER — Telehealth: Payer: Self-pay | Admitting: Family Medicine

## 2019-03-07 ENCOUNTER — Ambulatory Visit: Payer: Medicare Other | Admitting: Licensed Clinical Social Worker

## 2019-03-07 DIAGNOSIS — E782 Mixed hyperlipidemia: Secondary | ICD-10-CM

## 2019-03-07 DIAGNOSIS — F1110 Opioid abuse, uncomplicated: Secondary | ICD-10-CM

## 2019-03-07 DIAGNOSIS — Z794 Long term (current) use of insulin: Secondary | ICD-10-CM

## 2019-03-07 DIAGNOSIS — E119 Type 2 diabetes mellitus without complications: Secondary | ICD-10-CM

## 2019-03-07 DIAGNOSIS — I10 Essential (primary) hypertension: Secondary | ICD-10-CM

## 2019-03-07 NOTE — Patient Instructions (Addendum)
Licensed Clinical Social Worker Visit Information  Grace Stokes is a 63 y.o. year old female who is a primary care patient of Stacks, Cletus Gash, MD. The CM team was consulted for assistance with chronic disease management and care coordination.   I reached out to [employer], spouse of client, by phone today. I spoke with [name] and he gave me the mobile cell number for client. I called client mobile cell number several times today. I was not able to speak with Grace Stokes via phone today. I did leave a message for [name], asking her to please call LCSW at [phone] to discuss CCM program services available  Materials Provided: No  Follow Up Plan: LCSW to call client or [name] in next 3 weeks to talk with client about CCM program services available  LCSW was not able to speak with client via phone today; thus the client was not able to verbalize understanding of instructions provided today and was not able to accept or  decline a print copy of patient instruction materials.   Norva Riffle.Tara Wich MSW, LCSW Licensed Clinical Social Worker [facility] [phone]

## 2019-03-07 NOTE — Telephone Encounter (Signed)
Attempted to contact patient - NA °

## 2019-03-07 NOTE — Chronic Care Management (AMB) (Signed)
  Care Management Note   Grace Stokes is a 63 y.o. year old female who is a primary care patient of Stacks, Cletus Gash, MD. The CM team was consulted for assistance with chronic disease management and care coordination.   I reached out to [employer], spouse of client, by phone today. I spoke with Grace Stokes and he gave me the mobile cell number for client. I called client mobile cell number several times today. I was not able to speak with Grace Stokes via phone today. I did leave a message for Grace Stokes, asking her to please call LCSW at [phone] to discuss CCM program services available  Review of patient status, including review of consultants reports, relevant laboratory and other test results, and collaboration with appropriate care team members and the patient's provider was performed as part of comprehensive patient evaluation and provision of chronic care management services.   Medications    acetaminophen (TYLENOL) 325 MG tablet    amLODipine (NORVASC) 10 MG tablet    blood glucose meter kit and supplies KIT    cyclobenzaprine (FLEXERIL) 10 MG tablet    gabapentin (NEURONTIN) 300 MG capsule    insulin NPH Human (NOVOLIN N RELION) 100 UNIT/ML injection    Insulin Syringe-Needle U-100 (SAFETY-GLIDE 0.3CC SYR 29GX1/2) 29G X 1/2" 0.3 ML MISC    levETIRAcetam (KEPPRA) 500 MG tablet(Expired)    lisinopril (ZESTRIL) 20 MG tablet    metoprolol succinate (TOPROL-XL) 100 MG 24 hr tablet    traZODone (DESYREL) 100 MG tablet    TRUEPLUS LANCETS 30G MISC     Follow Up Plan: LCSW to call client or Grace Stokes, spouse of client, in next 3 weeks to talk with client about CCM program services available.  Grace Stokes MSW, LCSW Licensed Clinical Social Worker [facility] [phone]

## 2019-03-08 ENCOUNTER — Telehealth: Payer: Self-pay | Admitting: Family Medicine

## 2019-03-08 LAB — CMP14+EGFR
ALT: 59 IU/L — ABNORMAL HIGH (ref 0–32)
AST: 80 IU/L — ABNORMAL HIGH (ref 0–40)
Albumin/Globulin Ratio: 0.7 — ABNORMAL LOW (ref 1.2–2.2)
Albumin: 3.4 g/dL — ABNORMAL LOW (ref 3.8–4.8)
Alkaline Phosphatase: 227 IU/L — ABNORMAL HIGH (ref 39–117)
BUN/Creatinine Ratio: 9 — ABNORMAL LOW (ref 12–28)
BUN: 5 mg/dL — ABNORMAL LOW (ref 8–27)
Bilirubin Total: 1.1 mg/dL (ref 0.0–1.2)
CO2: 26 mmol/L (ref 20–29)
Calcium: 8.7 mg/dL (ref 8.7–10.3)
Chloride: 100 mmol/L (ref 96–106)
Creatinine, Ser: 0.53 mg/dL — ABNORMAL LOW (ref 0.57–1.00)
GFR calc Af Amer: 117 mL/min/{1.73_m2} (ref >59)
GFR calc non Af Amer: 101 mL/min/{1.73_m2} (ref >59)
Globulin, Total: 4.8 g/dL — ABNORMAL HIGH (ref 1.5–4.5)
Glucose: 433 mg/dL (ref 65–99)
Potassium: 3.8 mmol/L (ref 3.5–5.2)
Sodium: 135 mmol/L (ref 134–144)
Total Protein: 8.2 g/dL (ref 6.0–8.5)

## 2019-03-08 LAB — LIPID PANEL
Chol/HDL Ratio: 2.5 ratio (ref 0.0–4.4)
Cholesterol, Total: 178 mg/dL (ref 100–199)
HDL: 72 mg/dL (ref >39)
LDL Chol Calc (NIH): 93 mg/dL (ref 0–99)
Triglycerides: 66 mg/dL (ref 0–149)
VLDL Cholesterol Cal: 13 mg/dL (ref 5–40)

## 2019-03-08 LAB — CBC WITH DIFFERENTIAL/PLATELET
Hematocrit: 35.3 % (ref 34.0–46.6)
Hemoglobin: 12.1 g/dL (ref 11.1–15.9)
MCH: 31.3 pg (ref 26.6–33.0)
MCHC: 34.3 g/dL (ref 31.5–35.7)
MCV: 91 fL (ref 79–97)
Platelets: 68 10*3/uL — CL (ref 150–450)
RBC: 3.87 x10E6/uL (ref 3.77–5.28)
RDW: 12.9 % (ref 11.7–15.4)
WBC: 2.2 10*3/uL — CL (ref 3.4–10.8)

## 2019-03-08 NOTE — Telephone Encounter (Signed)
Information and referral has been faxed. Awaiting response from specialist office so we can contact with appt.

## 2019-03-13 ENCOUNTER — Other Ambulatory Visit: Payer: Self-pay | Admitting: *Deleted

## 2019-03-30 ENCOUNTER — Ambulatory Visit: Payer: Medicare Other | Admitting: Licensed Clinical Social Worker

## 2019-03-30 DIAGNOSIS — F1110 Opioid abuse, uncomplicated: Secondary | ICD-10-CM

## 2019-03-30 DIAGNOSIS — I10 Essential (primary) hypertension: Secondary | ICD-10-CM

## 2019-03-30 DIAGNOSIS — E782 Mixed hyperlipidemia: Secondary | ICD-10-CM

## 2019-03-30 DIAGNOSIS — E119 Type 2 diabetes mellitus without complications: Secondary | ICD-10-CM

## 2019-03-30 NOTE — Chronic Care Management (AMB) (Signed)
  Care Management Note   Grace Stokes is a 63 y.o. year old female who is a primary care patient of Stacks, Cletus Gash, MD. The CM team was consulted for assistance with chronic disease management and care coordination.   I reached out to Murrell Converse by phone today. I called the home phone number of client and received no answer. I called the cell number of client and received no answer. LCSW left phone message on 03/30/2019 requesting Monette Omara please return call to LCSW to talk more about CCM program services  Review of patient status, including review of consultants reports, relevant laboratory and other test results, and collaboration with appropriate care team members and the patient's provider was performed as part of comprehensive patient evaluation and provision of chronic care management services.   Medications   acetaminophen (TYLENOL) 325 MG tablet amLODipine (NORVASC) 10 MG tablet blood glucose meter kit and supplies KIT cyclobenzaprine (FLEXERIL) 10 MG tablet gabapentin (NEURONTIN) 300 MG capsule insulin NPH Human (NOVOLIN N RELION) 100 UNIT/ML injection Insulin Syringe-Needle U-100 (SAFETY-GLIDE 0.3CC SYR 29GX1/2) 29G X 1/2" 0.3 ML MISC levETIRAcetam (KEPPRA) 500 MG tablet(Expired) lisinopril (ZESTRIL) 20 MG tablet metoprolol succinate (TOPROL-XL) 100 MG 24 hr tablet traZODone (DESYREL) 100 MG tablet TRUEPLUS LANCETS 30G MISC  Follow Up Plan: LCSW to call Hulda Humphrey in next 4 weeks to attempt to speak with her about CCM program services  Norva Riffle.Caresse Sedivy MSW, LCSW Licensed Clinical Social Worker [facility] [phone]

## 2019-03-30 NOTE — Patient Instructions (Addendum)
Licensed Clinical Social Worker Visit Information  Materials Provided: No  Grace Stokes is a 63 y.o. year old female who is a primary care patient of Stacks, Cletus Gash, MD. The CM team was consulted for assistance with chronic disease management and care coordination.   I reached out to Murrell Converse by phone today. I called the home phone number of client and received no answer. I called the cell number of client and received no answer. LCSW left phone message on 03/30/2019 requesting Grace Stokes please return call to LCSW to talk more about CCM program services  Follow Up Plan: LCSW to call Grace Stokes in next 4 weeks to attempt to speak with Grace Stokes about CCM program services  LCSW was not able to speak via phone today with patient; thus, the patient was not able to  verbalize understanding of instructions provided today and was not able to accept or decline a print copy of patient instruction materials.   Grace Stokes.Grace Stokes MSW, LCSW Licensed Clinical Social Worker [facility] [phone]

## 2019-04-18 ENCOUNTER — Ambulatory Visit (INDEPENDENT_AMBULATORY_CARE_PROVIDER_SITE_OTHER): Payer: Medicare Other | Admitting: Family Medicine

## 2019-04-18 ENCOUNTER — Encounter: Payer: Self-pay | Admitting: Family Medicine

## 2019-04-18 ENCOUNTER — Encounter: Payer: Self-pay | Admitting: *Deleted

## 2019-04-18 ENCOUNTER — Other Ambulatory Visit: Payer: Self-pay | Admitting: *Deleted

## 2019-04-18 ENCOUNTER — Other Ambulatory Visit: Payer: Self-pay

## 2019-04-18 VITALS — BP 166/85 | HR 115 | Temp 97.5°F | Ht 60.0 in | Wt 144.6 lb

## 2019-04-18 DIAGNOSIS — G4701 Insomnia due to medical condition: Secondary | ICD-10-CM

## 2019-04-18 DIAGNOSIS — M4712 Other spondylosis with myelopathy, cervical region: Secondary | ICD-10-CM | POA: Diagnosis not present

## 2019-04-18 DIAGNOSIS — Z794 Long term (current) use of insulin: Secondary | ICD-10-CM

## 2019-04-18 DIAGNOSIS — F1120 Opioid dependence, uncomplicated: Secondary | ICD-10-CM

## 2019-04-18 DIAGNOSIS — E1165 Type 2 diabetes mellitus with hyperglycemia: Secondary | ICD-10-CM

## 2019-04-18 DIAGNOSIS — E782 Mixed hyperlipidemia: Secondary | ICD-10-CM | POA: Diagnosis not present

## 2019-04-18 DIAGNOSIS — IMO0002 Reserved for concepts with insufficient information to code with codable children: Secondary | ICD-10-CM

## 2019-04-18 DIAGNOSIS — I10 Essential (primary) hypertension: Secondary | ICD-10-CM | POA: Diagnosis not present

## 2019-04-18 DIAGNOSIS — E119 Type 2 diabetes mellitus without complications: Secondary | ICD-10-CM

## 2019-04-18 DIAGNOSIS — E118 Type 2 diabetes mellitus with unspecified complications: Secondary | ICD-10-CM

## 2019-04-18 LAB — BAYER DCA HB A1C WAIVED: HB A1C (BAYER DCA - WAIVED): 9 % — ABNORMAL HIGH (ref <7.0)

## 2019-04-18 MED ORDER — INSULIN NPH (HUMAN) (ISOPHANE) 100 UNIT/ML ~~LOC~~ SUSP: SUBCUTANEOUS | 11 refills | Status: DC

## 2019-04-18 NOTE — Progress Notes (Signed)
Subjective:  Patient ID: Grace Stokes, female    DOB: [birth year]  Age: 64 y.o. MRN: [id number]  CC: Medical Management of Chronic Issues   HPI Grace Stokes presents for presents forFollow-up of diabetes. Patient checks blood sugar at home.   150 fasting and 250 postprandial Patient denies symptoms such as polyuria, polydipsia, excessive hunger, nausea No significant hypoglycemic spells noted. Medications reviewed. Pt reports taking them regularly without complication/adverse reaction being reported today.  Checking feet daily.   Depression screen [facility] 2/9 04/18/2019 03/02/2019 01/10/2019  Decreased Interest 0 0 0  Down, Depressed, Hopeless - 0 0  PHQ - 2 Score 0 0 0  Altered sleeping - 0 -  Tired, decreased energy - 0 -  Change in appetite - 0 -  Feeling bad or failure about yourself  - 0 -  Trouble concentrating - 0 -  Moving slowly or fidgety/restless - 0 -  Suicidal thoughts - 0 -  PHQ-9 Score - 0 -    History Grace Stokes has a past medical history of Arthritis, Bell's palsy, Cataract, Chronic pain, Renal disorder, Scoliosis, and Toxemia in pregnancy.   Grace Stokes has a past surgical history that includes Back surgery; Cesarean section; Tubal ligation; Shoulder surgery; Rotator cuff repair; Carpal tunnel release; Anterior cervical decomp/discectomy fusion (N/A, 07/09/2013); Incision / drainage hand / finger (Left, 12/17/2016); and I & D extremity (Left, 12/17/2016).   Grace Stokes family history includes COPD in Grace Stokes mother.Grace Stokes reports that Grace Stokes has been smoking cigarettes. Grace Stokes has been smoking about 0.25 packs per day. Grace Stokes has never used smokeless tobacco. Grace Stokes reports current drug use. Grace Stokes reports that Grace Stokes does not drink alcohol.    ROS Review of Systems  Constitutional: Negative.   HENT: Negative for congestion.   Eyes: Negative for visual disturbance.  Respiratory: Negative for shortness of breath.   Cardiovascular: Negative for chest pain.  Gastrointestinal: Negative for abdominal pain,  constipation, diarrhea, nausea and vomiting.  Genitourinary: Negative for difficulty urinating.  Musculoskeletal: Positive for arthralgias, myalgias and neck pain.  Neurological: Negative for headaches.  Psychiatric/Behavioral: Negative for sleep disturbance.    Objective:  BP (!) 166/85   Pulse (!) 115   Temp (!) 97.5 F (36.4 C) (Temporal)   Ht 5' (1.524 m)   Wt 144 lb 9.6 oz (65.6 kg)   LMP 04/06/2011   SpO2 97%   BMI 28.24 kg/m   BP Readings from Last 3 Encounters:  04/18/19 (!) 166/85  03/02/19 (!) 163/82  02/06/19 (!) 146/80    Wt Readings from Last 3 Encounters:  04/18/19 144 lb 9.6 oz (65.6 kg)  03/02/19 141 lb (64 kg)  02/03/19 148 lb 9.4 oz (67.4 kg)     Physical Exam Constitutional:      General: Grace Stokes is not in acute distress.    Appearance: Grace Stokes is well-developed.  HENT:     Head: Normocephalic and atraumatic.     Right Ear: External ear normal.     Left Ear: External ear normal.     Nose: Nose normal.  Eyes:     Conjunctiva/sclera: Conjunctivae normal.     Pupils: Pupils are equal, round, and reactive to light.  Neck:     Thyroid: No thyromegaly.  Cardiovascular:     Rate and Rhythm: Normal rate and regular rhythm.     Heart sounds: Normal heart sounds. No murmur.  Pulmonary:     Effort: Pulmonary effort is normal. No respiratory distress.     Breath sounds: Normal breath  sounds. No wheezing or rales.  Abdominal:     General: Bowel sounds are normal. There is no distension.     Palpations: Abdomen is soft.     Tenderness: There is no abdominal tenderness.  Musculoskeletal:        General: Deformity (severe kyphosis) present.     Cervical back: Normal range of motion and neck supple.  Lymphadenopathy:     Cervical: No cervical adenopathy.  Skin:    General: Skin is warm and dry.  Neurological:     Mental Status: Grace Stokes is alert and oriented to person, place, and time.     Deep Tendon Reflexes: Reflexes are normal and symmetric.  Psychiatric:         Behavior: Behavior normal.        Thought Content: Thought content normal.        Judgment: Judgment normal.       Assessment & Plan:   Grace Stokes was seen today for medical management of chronic issues.  Diagnoses and all orders for this visit:  Cervical spondylosis with myelopathy -     Sedimentation rate -     CBC -     CMP -     Lipid -     cyclobenzaprine (FLEXERIL) 10 MG tablet; Take 1 tablet (10 mg total) by mouth 2 (two) times daily as needed for muscle spasms.  Uncontrolled type 2 diabetes mellitus with complication, with long-term current use of insulin (HCC) -     CBC -     CMP -     Lipid -     insulin NPH Human (NOVOLIN N RELION) 100 UNIT/ML injection; Inject 0.22 mLs (22 Units total) into the skin daily before breakfast AND 0.4 mLs (40 Units total) daily with supper.  Essential hypertension -     CBC -     CMP -     Lipid -     amLODipine (NORVASC) 10 MG tablet; Take 1 tablet (10 mg total) by mouth daily. For blood pressure -     lisinopril (ZESTRIL) 20 MG tablet; Take 1 tablet (20 mg total) by mouth daily. For kidney protection -     metoprolol succinate (TOPROL-XL) 100 MG 24 hr tablet; Take 1 tablet (100 mg total) by mouth daily. For heart and blood pressure  Mixed hyperlipidemia -     CBC -     CMP -     Lipid  Type 2 diabetes mellitus without complication, with long-term current use of insulin (HCC) -     insulin NPH Human (NOVOLIN N RELION) 100 UNIT/ML injection; Inject 0.22 mLs (22 Units total) into the skin daily before breakfast AND 0.4 mLs (40 Units total) daily with supper. -     [id number]  Uncomplicated opioid dependence ([place])  Insomnia due to medical condition -     traZODone (DESYREL) 100 MG tablet; Take 1 tablet (100 mg total) by mouth at bedtime.       I have discontinued [name]. Frie's gabapentin. I have also changed Grace Stokes insulin NPH Human. Additionally, I am having Grace Stokes maintain Grace Stokes TRUEplus Lancets 30G, Insulin Syringe-Needle U-100,  levETIRAcetam, blood glucose meter kit and supplies, acetaminophen, amLODipine, cyclobenzaprine, lisinopril, metoprolol succinate, and traZODone.  Allergies as of 04/18/2019   No Known Allergies     Medication List       Accurate as of April 18, 2019 11:59 PM. If you have any questions, ask your nurse or doctor.  STOP taking these medications   gabapentin 300 MG capsule Commonly known as: NEURONTIN Stopped by: Claretta Fraise, MD     TAKE these medications   acetaminophen 325 MG tablet Commonly known as: TYLENOL Take 2 tablets (650 mg total) by mouth every 6 (six) hours as needed for mild pain (or Fever >/= 101).   amLODipine 10 MG tablet Commonly known as: NORVASC Take 1 tablet (10 mg total) by mouth daily. For blood pressure   blood glucose meter kit and supplies Kit Dispense based on patient and insurance preference. Use up to four times daily as directed.   cyclobenzaprine 10 MG tablet Commonly known as: FLEXERIL Take 1 tablet (10 mg total) by mouth 2 (two) times daily as needed for muscle spasms.   insulin NPH Human 100 UNIT/ML injection Commonly known as: NovoLIN N ReliOn Inject 0.22 mLs (22 Units total) into the skin daily before breakfast AND 0.4 mLs (40 Units total) daily with supper. What changed: See the new instructions. Changed by: Claretta Fraise, MD   Insulin Syringe-Needle U-100 29G X 1/2" 0.3 ML Misc Commonly known as: SAFETY-GLIDE 0.3CC SYR 29GX1/2 Use as directed, 2 times daily with meals.   levETIRAcetam 500 MG tablet Commonly known as: Keppra Take 1 tablet (500 mg total) by mouth 2 (two) times daily for 6 days.   lisinopril 20 MG tablet Commonly known as: ZESTRIL Take 1 tablet (20 mg total) by mouth daily. For kidney protection   metoprolol succinate 100 MG 24 hr tablet Commonly known as: TOPROL-XL Take 1 tablet (100 mg total) by mouth daily. For heart and blood pressure   traZODone 100 MG tablet Commonly known as: DESYREL Take 1 tablet  (100 mg total) by mouth at bedtime.   TRUEplus Lancets 30G Misc Check blood sugar up to 4 times a day        Follow-up: Return in about 3 months (around 07/17/2019).  Claretta Fraise, M.D.

## 2019-04-19 LAB — CMP14+EGFR
ALT: 77 IU/L — ABNORMAL HIGH (ref 0–32)
AST: 116 IU/L — ABNORMAL HIGH (ref 0–40)
Albumin/Globulin Ratio: 0.7 — ABNORMAL LOW (ref 1.2–2.2)
Albumin: 3 g/dL — ABNORMAL LOW (ref 3.8–4.8)
Alkaline Phosphatase: 144 IU/L — ABNORMAL HIGH (ref 39–117)
BUN/Creatinine Ratio: 18 (ref 12–28)
BUN: 9 mg/dL (ref 8–27)
Bilirubin Total: 1.2 mg/dL (ref 0.0–1.2)
CO2: 24 mmol/L (ref 20–29)
Calcium: 8.9 mg/dL (ref 8.7–10.3)
Chloride: 98 mmol/L (ref 96–106)
Creatinine, Ser: 0.49 mg/dL — ABNORMAL LOW (ref 0.57–1.00)
GFR calc Af Amer: 120 mL/min/{1.73_m2} (ref >59)
GFR calc non Af Amer: 104 mL/min/{1.73_m2} (ref >59)
Globulin, Total: 4.6 g/dL — ABNORMAL HIGH (ref 1.5–4.5)
Glucose: 349 mg/dL — ABNORMAL HIGH (ref 65–99)
Potassium: 3.9 mmol/L (ref 3.5–5.2)
Sodium: 131 mmol/L — ABNORMAL LOW (ref 134–144)
Total Protein: 7.6 g/dL (ref 6.0–8.5)

## 2019-04-19 LAB — CBC WITH DIFFERENTIAL/PLATELET
Basophils Absolute: 0 10*3/uL (ref 0.0–0.2)
Basos: 0 %
EOS (ABSOLUTE): 0 10*3/uL (ref 0.0–0.4)
Eos: 1 %
Hematocrit: 34 % (ref 34.0–46.6)
Hemoglobin: 11.6 g/dL (ref 11.1–15.9)
Immature Grans (Abs): 0 10*3/uL (ref 0.0–0.1)
Immature Granulocytes: 0 %
Lymphocytes Absolute: 0.6 10*3/uL — ABNORMAL LOW (ref 0.7–3.1)
Lymphs: 26 %
MCH: 30.5 pg (ref 26.6–33.0)
MCHC: 34.1 g/dL (ref 31.5–35.7)
MCV: 90 fL (ref 79–97)
Monocytes Absolute: 0.3 10*3/uL (ref 0.1–0.9)
Monocytes: 13 %
Neutrophils Absolute: 1.5 10*3/uL (ref 1.4–7.0)
Neutrophils: 60 %
Platelets: 63 10*3/uL — CL (ref 150–450)
RBC: 3.8 x10E6/uL (ref 3.77–5.28)
RDW: 12.7 % (ref 11.7–15.4)
WBC: 2.4 10*3/uL — CL (ref 3.4–10.8)

## 2019-04-19 LAB — LIPID PANEL
Chol/HDL Ratio: 2.5 ratio (ref 0.0–4.4)
Cholesterol, Total: 160 mg/dL (ref 100–199)
HDL: 64 mg/dL (ref >39)
LDL Chol Calc (NIH): 80 mg/dL (ref 0–99)
Triglycerides: 89 mg/dL (ref 0–149)
VLDL Cholesterol Cal: 16 mg/dL (ref 5–40)

## 2019-04-19 LAB — SEDIMENTATION RATE: Sed Rate: 75 mm/hr — ABNORMAL HIGH (ref 0–40)

## 2019-04-25 MED ORDER — AMLODIPINE BESYLATE 10 MG PO TABS: 10.0000 mg | ORAL_TABLET | Freq: Every day | ORAL | 5 refills | Status: DC

## 2019-04-25 MED ORDER — TRAZODONE HCL 100 MG PO TABS: 100.0000 mg | ORAL_TABLET | Freq: Every day | ORAL | 5 refills | Status: DC

## 2019-04-25 MED ORDER — LISINOPRIL 20 MG PO TABS: 20.0000 mg | ORAL_TABLET | Freq: Every day | ORAL | 1 refills | Status: DC

## 2019-04-25 MED ORDER — CYCLOBENZAPRINE HCL 10 MG PO TABS: 10.0000 mg | ORAL_TABLET | Freq: Two times a day (BID) | ORAL | 5 refills | Status: DC | PRN

## 2019-04-25 MED ORDER — METOPROLOL SUCCINATE ER 100 MG PO TB24: 100.0000 mg | ORAL_TABLET | Freq: Every day | ORAL | 1 refills | Status: DC

## 2019-04-26 ENCOUNTER — Ambulatory Visit: Payer: Medicare Other | Admitting: Licensed Clinical Social Worker

## 2019-04-26 DIAGNOSIS — I10 Essential (primary) hypertension: Secondary | ICD-10-CM

## 2019-04-26 DIAGNOSIS — E782 Mixed hyperlipidemia: Secondary | ICD-10-CM

## 2019-04-26 DIAGNOSIS — E119 Type 2 diabetes mellitus without complications: Secondary | ICD-10-CM

## 2019-04-26 NOTE — Chronic Care Management (AMB) (Signed)
  Care Management Note   Grace Stokes is a 64 y.o. year old female who is a primary care patient of Stacks, Cletus Gash, MD. The CM team was consulted for assistance with chronic disease management and care coordination.   I reached out to Grace Stokes , spouse of client, by phone today.   [name] were given information about Chronic Care Management services today including:  1. CCM service includes personalized support from designated clinical staff supervised by her physician, including individualized plan of care and coordination with other care providers 2. 24/7 contact phone numbers for assistance for urgent and routine care needs. 3. Service will only be billed when office clinical staff spend 20 minutes or more in a month to coordinate care. 4. Only one practitioner may furnish and bill the service in a calendar month. 5. The patient may stop CCM services at any time (effective at the end of the month) by phone call to the office staff. 6. The patient will be responsible for cost sharing (co-pay) of up to 20% of the service fee (after annual deductible is met). Patient did not agree to enrollment in care management services and does not wish to consider at this time.   Review of patient status, including review of consultants reports, relevant laboratory and other test results, and collaboration with appropriate care team members and the patient's provider was performed as part of comprehensive patient evaluation and provision of chronic care management services.   Medications   acetaminophen (TYLENOL) 325 MG tablet amLODipine (NORVASC) 10 MG tablet blood glucose meter kit and supplies KIT cyclobenzaprine (FLEXERIL) 10 MG tablet insulin NPH Human (NOVOLIN N RELION) 100 UNIT/ML injection Insulin Syringe-Needle U-100 (SAFETY-GLIDE 0.3CC SYR 29GX1/2) 29G X 1/2" 0.3 ML MISC levETIRAcetam (KEPPRA) 500 MG tablet(Expired) lisinopril (ZESTRIL) 20 MG  tablet metoprolol succinate (TOPROL-XL) 100 MG 24 hr tablet traZODone (DESYREL) 100 MG tablet TRUEPLUS LANCETS 30G MISC  LCSW has contacted client/spouse of client 3 times to discuss CCM program services. CCM program information has been provided to client. Client has not indicated interest in CCM program participation  Grace Stokes.Grace Stokes MSW, LCSW Licensed Clinical Social Worker [facility] [phone]

## 2019-04-26 NOTE — Patient Instructions (Addendum)
Licensed Clinical Social Worker Visit Information  Materials Provided: No  I reached out to [name] , spouse of client, by phone today.   [name] were given information about Chronic Care Management services today including:  1. CCM service includes personalized support from designated clinical staff supervised by her physician, including individualized plan of care and coordination with other care providers 2. 24/7 contact phone numbers for assistance for urgent and routine care needs. 3. Service will only be billed when office clinical staff spend 20 minutes or more in a month to coordinate care. 4. Only one practitioner may furnish and bill the service in a calendar month. 5. The patient may stop CCM services at any time (effective at the end of the month) by phone call to the office staff. 6. The patient will be responsible for cost sharing (co-pay) of up to 20% of the service fee (after annual deductible is met). Patient did not agree to enrollment in care management services and does not wish to consider at this time.   LCSW has contacted client/spouse of client 3 times to discuss CCM program services. CCM program information has been provided to client. Client has not indicated interest in CCM program participation  [name], spouse,  verbalized understanding of instructions provided today and declined a print copy of patient instruction materials.   Norva Riffle.Kham Zuckerman MSW, LCSW Licensed Clinical Social Worker [facility] [phone]

## 2019-05-01 DIAGNOSIS — E119 Type 2 diabetes mellitus without complications: Secondary | ICD-10-CM | POA: Diagnosis not present

## 2019-05-01 DIAGNOSIS — H40033 Anatomical narrow angle, bilateral: Secondary | ICD-10-CM | POA: Diagnosis not present

## 2019-07-06 ENCOUNTER — Other Ambulatory Visit: Payer: Self-pay | Admitting: Family Medicine

## 2019-07-06 DIAGNOSIS — Z1231 Encounter for screening mammogram for malignant neoplasm of breast: Secondary | ICD-10-CM

## 2019-07-13 ENCOUNTER — Ambulatory Visit
Admission: RE | Admit: 2019-07-13 | Discharge: 2019-07-13 | Disposition: A | Discharge status: Home / Self Care | Payer: Medicare Other | Source: Ambulatory Visit | Attending: Family Medicine | Admitting: Family Medicine

## 2019-07-13 ENCOUNTER — Other Ambulatory Visit: Payer: Self-pay

## 2019-07-13 ENCOUNTER — Telehealth: Payer: Self-pay | Admitting: Family Medicine

## 2019-07-13 DIAGNOSIS — Z1231 Encounter for screening mammogram for malignant neoplasm of breast: Secondary | ICD-10-CM

## 2019-07-13 NOTE — Telephone Encounter (Signed)
  REFERRAL REQUEST Telephone Note 07/13/2019  What type of referral do you need? [facility]   Have you been seen at our office for this problem? Yes - patient is a current Patient but for insurance purposes needs a new ref sent to office - old one is expired.  (Advise that they may need an appointment with their PCP before a referral can be done)  Is there a particular doctor or location that you prefer? [place].  Patient notified that referrals can take up to a week or longer to process. If they haven't heard anything within a week they should call back and speak with the referral department.

## 2019-07-13 NOTE — Telephone Encounter (Signed)
Office visit in January. Advise on referral updates.

## 2019-07-17 ENCOUNTER — Ambulatory Visit: Payer: Medicare Other | Admitting: Family Medicine

## 2019-07-19 ENCOUNTER — Other Ambulatory Visit: Payer: Self-pay | Admitting: Family Medicine

## 2019-07-19 DIAGNOSIS — B181 Chronic viral hepatitis B without delta-agent: Secondary | ICD-10-CM

## 2019-07-19 NOTE — Telephone Encounter (Signed)
Left message , referral done.

## 2019-07-19 NOTE — Telephone Encounter (Signed)
Done. Thanks, WS 

## 2019-07-26 ENCOUNTER — Ambulatory Visit (INDEPENDENT_AMBULATORY_CARE_PROVIDER_SITE_OTHER): Payer: Medicare Other | Admitting: Family Medicine

## 2019-07-26 ENCOUNTER — Encounter: Payer: Self-pay | Admitting: Family Medicine

## 2019-07-26 ENCOUNTER — Other Ambulatory Visit: Payer: Self-pay

## 2019-07-26 VITALS — BP 133/72 | HR 69 | Temp 98.1°F | Resp 20 | Ht 60.0 in | Wt 153.2 lb

## 2019-07-26 DIAGNOSIS — E782 Mixed hyperlipidemia: Secondary | ICD-10-CM | POA: Diagnosis not present

## 2019-07-26 DIAGNOSIS — IMO0002 Reserved for concepts with insufficient information to code with codable children: Secondary | ICD-10-CM

## 2019-07-26 DIAGNOSIS — E1165 Type 2 diabetes mellitus with hyperglycemia: Secondary | ICD-10-CM

## 2019-07-26 DIAGNOSIS — E118 Type 2 diabetes mellitus with unspecified complications: Secondary | ICD-10-CM

## 2019-07-26 DIAGNOSIS — Z794 Long term (current) use of insulin: Secondary | ICD-10-CM | POA: Diagnosis not present

## 2019-07-26 DIAGNOSIS — E119 Type 2 diabetes mellitus without complications: Secondary | ICD-10-CM | POA: Diagnosis not present

## 2019-07-26 DIAGNOSIS — I10 Essential (primary) hypertension: Secondary | ICD-10-CM | POA: Diagnosis not present

## 2019-07-26 LAB — BAYER DCA HB A1C WAIVED: HB A1C (BAYER DCA - WAIVED): 11.3 % — ABNORMAL HIGH (ref <7.0)

## 2019-07-26 MED ORDER — INSULIN NPH (HUMAN) (ISOPHANE) 100 UNIT/ML ~~LOC~~ SUSP: SUBCUTANEOUS | 11 refills | Status: DC
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
## 2019-07-27 DIAGNOSIS — B181 Chronic viral hepatitis B without delta-agent: Secondary | ICD-10-CM | POA: Diagnosis not present

## 2019-07-27 LAB — CBC WITH DIFFERENTIAL/PLATELET
Basophils Absolute: 0 10*3/uL (ref 0.0–0.2)
Basos: 1 %
EOS (ABSOLUTE): 0.1 10*3/uL (ref 0.0–0.4)
Eos: 2 %
Hematocrit: 37.3 % (ref 34.0–46.6)
Hemoglobin: 12.8 g/dL (ref 11.1–15.9)
Immature Grans (Abs): 0 10*3/uL (ref 0.0–0.1)
Immature Granulocytes: 0 %
Lymphocytes Absolute: 0.9 10*3/uL (ref 0.7–3.1)
Lymphs: 41 %
MCH: 30.5 pg (ref 26.6–33.0)
MCHC: 34.3 g/dL (ref 31.5–35.7)
MCV: 89 fL (ref 79–97)
Monocytes Absolute: 0.2 10*3/uL (ref 0.1–0.9)
Monocytes: 9 %
Neutrophils Absolute: 1.1 10*3/uL — ABNORMAL LOW (ref 1.4–7.0)
Neutrophils: 47 %
Platelets: 45 10*3/uL — CL (ref 150–450)
RBC: 4.19 x10E6/uL (ref 3.77–5.28)
RDW: 13.3 % (ref 11.7–15.4)
WBC: 2.3 10*3/uL — CL (ref 3.4–10.8)

## 2019-07-27 LAB — LIPID PANEL
Chol/HDL Ratio: 3.7 ratio (ref 0.0–4.4)
Cholesterol, Total: 172 mg/dL (ref 100–199)
HDL: 47 mg/dL (ref >39)
LDL Chol Calc (NIH): 98 mg/dL (ref 0–99)
Triglycerides: 155 mg/dL — ABNORMAL HIGH (ref 0–149)
VLDL Cholesterol Cal: 27 mg/dL (ref 5–40)

## 2019-07-27 LAB — CMP14+EGFR
ALT: 40 IU/L — ABNORMAL HIGH (ref 0–32)
AST: 55 IU/L — ABNORMAL HIGH (ref 0–40)
Albumin/Globulin Ratio: 0.6 — ABNORMAL LOW (ref 1.2–2.2)
Albumin: 3 g/dL — ABNORMAL LOW (ref 3.8–4.8)
Alkaline Phosphatase: 150 IU/L — ABNORMAL HIGH (ref 39–117)
BUN/Creatinine Ratio: 14 (ref 12–28)
BUN: 9 mg/dL (ref 8–27)
Bilirubin Total: 0.8 mg/dL (ref 0.0–1.2)
CO2: 27 mmol/L (ref 20–29)
Calcium: 8.6 mg/dL — ABNORMAL LOW (ref 8.7–10.3)
Chloride: 103 mmol/L (ref 96–106)
Creatinine, Ser: 0.64 mg/dL (ref 0.57–1.00)
GFR calc Af Amer: 110 mL/min/{1.73_m2} (ref >59)
GFR calc non Af Amer: 95 mL/min/{1.73_m2} (ref >59)
Globulin, Total: 4.9 g/dL — ABNORMAL HIGH (ref 1.5–4.5)
Glucose: 315 mg/dL — ABNORMAL HIGH (ref 65–99)
Potassium: 4.1 mmol/L (ref 3.5–5.2)
Sodium: 138 mmol/L (ref 134–144)
Total Protein: 7.9 g/dL (ref 6.0–8.5)

## 2019-07-28 ENCOUNTER — Other Ambulatory Visit: Payer: Self-pay | Admitting: Nurse Practitioner

## 2019-07-28 DIAGNOSIS — B181 Chronic viral hepatitis B without delta-agent: Secondary | ICD-10-CM

## 2019-07-29 ENCOUNTER — Encounter: Payer: Self-pay | Admitting: Family Medicine

## 2019-07-31 ENCOUNTER — Ambulatory Visit (INDEPENDENT_AMBULATORY_CARE_PROVIDER_SITE_OTHER): Payer: Medicare Other | Admitting: *Deleted

## 2019-07-31 VITALS — BP 133/72 | Ht 60.0 in | Wt 153.0 lb

## 2019-07-31 DIAGNOSIS — Z0000 Encounter for general adult medical examination without abnormal findings: Secondary | ICD-10-CM | POA: Diagnosis not present

## 2019-07-31 NOTE — Patient Instructions (Signed)
  Grace Stokes , Thank you for taking time to come for your Medicare Wellness Visit. I appreciate your ongoing commitment to your health goals. Please review the following plan we discussed and let me know if I can assist you in the future.   These are the goals we discussed: Goals    . Prevent falls       This is a list of the screening recommended for you and due dates:  Health Maintenance  Topic Date Due  . COVID-19 Vaccine (1) Never done  . Pap Smear  Never done  . Complete foot exam   06/09/2018  . Eye exam for diabetics  07/14/2018  . Tetanus Vaccine  07/24/2020*  . Pneumococcal vaccine  07/25/2020*  . Flu Shot  11/12/2019  . Hemoglobin A1C  01/25/2020  . Mammogram  07/12/2021  . Colon Cancer Screening  03/18/2026  .  Hepatitis C: One time screening is recommended by Center for Disease Control  (CDC) for  adults born from [birth year] through 1965.   Completed  . HIV Screening  Completed  *Topic was postponed. The date shown is not the original due date.   Keep follow up with DR Livia Snellen

## 2019-07-31 NOTE — Progress Notes (Signed)
MEDICARE ANNUAL WELLNESS VISIT  07/31/2019  Telephone Visit Disclaimer This Medicare AWV was conducted by telephone due to national recommendations for restrictions regarding the COVID-19 Pandemic (e.g. social distancing).  I verified, using two identifiers, that I am speaking with Grace Stokes or their authorized healthcare agent. I discussed the limitations, risks, security, and privacy concerns of performing an evaluation and management service by telephone and the potential availability of an in-person appointment in the future. The patient expressed understanding and agreed to proceed.   Subjective:  Grace Stokes is a 64 y.o. female patient of Stacks, Cletus Gash, MD who had a Medicare Annual Wellness Visit today via telephone. Mitzi is Disabled and lives with their spouse. she has 1 child. she reports that she is socially active and does interact with friends/family regularly. she is not physically active and enjoys music.  Patient Care Team: Claretta Fraise, MD as PCP - General (Family Medicine) Shea Evans, Norva Riffle, LCSW as Social Worker (Licensed Clinical Social Worker)  Advanced Directives 07/31/2019 02/04/2019 02/03/2019 12/22/2018 12/17/2016 12/17/2016 12/16/2016  Does Patient Have a Medical Advance Directive? Yes No No No - No No  Type of Advance Directive Living will;Healthcare Power of Attorney - - - - - -  Does patient want to make changes to medical advance directive? No - Patient declined - - - No - Patient declined No - Patient declined -  Copy of [place] in Chart? No - copy requested - - - - - -  Would patient like information on creating a medical advance directive? No - Patient declined No - Patient declined - No - Patient declined No - Patient declined - -    Hospital Utilization Over the Past 12 Months: # of hospitalizations or ER visits: 2 # of surgeries: 0  Review of Systems    Patient reports that her overall health is worse compared  to last year.  General ROS: negative  Patient Reported Readings (BP, Pulse, CBG, Weight, etc) BP 133/72   Ht 5' (1.524 m)   Wt 153 lb (69.4 kg)   LMP 04/06/2011   BMI 29.88 kg/m    Pain Assessment       Current Medications & Allergies (verified) Allergies as of 07/31/2019      Reactions   Gabapentin    Uncontrolled shakes / cold intolerance       Medication List       Accurate as of July 31, 2019  2:38 PM. If you have any questions, ask your nurse or doctor.        acetaminophen 325 MG tablet Commonly known as: TYLENOL Take 2 tablets (650 mg total) by mouth every 6 (six) hours as needed for mild pain (or Fever >/= 101).   amLODipine 10 MG tablet Commonly known as: NORVASC Take 1 tablet (10 mg total) by mouth daily. For blood pressure   blood glucose meter kit and supplies Kit Dispense based on patient and insurance preference. Use up to four times daily as directed.   cyclobenzaprine 10 MG tablet Commonly known as: FLEXERIL Take 1 tablet (10 mg total) by mouth 2 (two) times daily as needed for muscle spasms.   insulin NPH Human 100 UNIT/ML injection Commonly known as: NovoLIN N ReliOn Inject 0.3 mLs (30 Units total) into the skin daily before breakfast AND 0.45 mLs (45 Units total) daily with supper.   Insulin Syringe-Needle U-100 29G X 1/2" 0.3 ML Misc Commonly known as: SAFETY-GLIDE 0.3CC SYR 29GX1/2  Use as directed, 2 times daily with meals.   levETIRAcetam 500 MG tablet Commonly known as: Keppra Take 1 tablet (500 mg total) by mouth 2 (two) times daily for 6 days.   lisinopril 20 MG tablet Commonly known as: ZESTRIL Take 1 tablet (20 mg total) by mouth daily. For kidney protection   metoprolol succinate 100 MG 24 hr tablet Commonly known as: TOPROL-XL Take 1 tablet (100 mg total) by mouth daily. For heart and blood pressure   traZODone 100 MG tablet Commonly known as: DESYREL Take 1 tablet (100 mg total) by mouth at bedtime.   TRUEplus  Lancets 30G Misc Check blood sugar up to 4 times a day       History (reviewed): Past Medical History:  Diagnosis Date  . Arthritis   . Bell's palsy    neuropathy   . Cataract   . Chronic pain   . Renal disorder   . Scoliosis   . Toxemia in pregnancy    Past Surgical History:  Procedure Laterality Date  . ANTERIOR CERVICAL DECOMP/DISCECTOMY FUSION N/A 07/09/2013   Procedure: ANTERIOR CERVICAL DECOMPRESSION/DISCECTOMY FUSION 2 LEVELS;  Surgeon: Consuella Lose, MD;  Location: [facility];  Service: Neurosurgery;  Laterality: N/A;  Anterior Cervical Fusion Cervical five-six, six-seven.  Marland Kitchen BACK SURGERY    . CARPAL TUNNEL RELEASE    . CESAREAN SECTION    . ELBOW SURGERY Left   . I & D EXTREMITY Left 12/17/2016   Procedure: IRRIGATION AND DEBRIDEMENT LEFT Stokes;  Surgeon: Iran Planas, MD;  Location: [facility];  Service: Orthopedics;  Laterality: Left;  . INCISION / DRAINAGE Stokes / FINGER Left 12/17/2016  . ROTATOR CUFF REPAIR    . SHOULDER SURGERY    . TUBAL LIGATION     Family History  Problem Relation Age of Onset  . COPD Mother   . Stroke Mother   . Arthritis Brother        back issues  . Cancer Brother        skin / back   . Obesity Son    Social History   Socioeconomic History  . Marital status: Married    Spouse name: [name]  . Number of children: 1  . Years of education: Not on file  . Highest education level: Not on file  Occupational History  . Occupation: disability   Tobacco Use  . Smoking status: Current Some Day Smoker    Packs/day: 0.50    Types: Cigarettes  . Smokeless tobacco: Never Used  Substance and Sexual Activity  . Alcohol use: No  . Drug use: Not Currently    Comment: in past   . Sexual activity: Not on file  Other Topics Concern  . Not on file  Social History Narrative  . Not on file   Social Determinants of Health   Financial Resource Strain: Low Risk   . Difficulty of Paying Living Expenses: Not hard at all  Food Insecurity: No  Food Insecurity  . Worried About Charity fundraiser in the Last Year: Never true  . Ran Out of Food in the Last Year: Never true  Transportation Needs: No Transportation Needs  . Lack of Transportation (Medical): No  . Lack of Transportation (Non-Medical): No  Physical Activity: Unknown  . Days of Exercise per Week: Patient refused  . Minutes of Exercise per Session: Patient refused  Stress: Unknown  . Feeling of Stress : Patient refused  Social Connections: Unknown  . Frequency of Communication  with Friends and Family: Patient refused  . Frequency of Social Gatherings with Friends and Family: Patient refused  . Attends Religious Services: Patient refused  . Active Member of Clubs or Organizations: Patient refused  . Attends Archivist Meetings: Patient refused  . Marital Status: Patient refused    Activities of Daily Living In your present state of health, do you have any difficulty performing the following activities: 07/31/2019 02/03/2019  Hearing? N N  Vision? Y N  Comment rx glasses -  Difficulty concentrating or making decisions? N N  Walking or climbing stairs? Y N  Comment due to pain -  Dressing or bathing? N Y  Doing errands, shopping? N N  Preparing Food and eating ? N -  Using the Toilet? N -  In the past six months, have you accidently leaked urine? N -  Do you have problems with loss of bowel control? N -  Managing your Medications? N -  Managing your Finances? N -  Housekeeping or managing your Housekeeping? N -  Some recent data might be hidden    Patient Education/ Literacy    Exercise Current Exercise Habits: The patient does not participate in regular exercise at present, Exercise limited by: orthopedic condition(s)  Diet Patient reports consuming 2 meals a day and 1 snack(s) a day Patient reports that her primary diet is: Regular Patient reports that she does have regular access to food.   Depression Screen PHQ 2/9 Scores 07/31/2019  07/26/2019 04/18/2019 03/02/2019 01/10/2019 06/21/2018 03/15/2018  PHQ - 2 Score 0 1 0 0 0 0 0  PHQ- 9 Score - - - 0 - - -     Fall Risk Fall Risk  07/31/2019 07/26/2019 04/18/2019 01/10/2019 06/21/2018  Falls in the past year? 0 0 0 0 0     Objective:  Grace Stokes seemed alert and oriented and she participated appropriately during our telephone visit.  Blood Pressure Weight BMI  BP Readings from Last 3 Encounters:  07/31/19 133/72  07/26/19 133/72  04/18/19 (!) 166/85   Wt Readings from Last 3 Encounters:  07/31/19 153 lb (69.4 kg)  07/26/19 153 lb 4 oz (69.5 kg)  04/18/19 144 lb 9.6 oz (65.6 kg)   BMI Readings from Last 1 Encounters:  07/31/19 29.88 kg/m    *Unable to obtain current vital signs, weight, and BMI due to telephone visit type  Hearing/Vision  . Selicia did not seem to have difficulty with hearing/understanding during the telephone conversation . Reports that she has had a formal eye exam by an eye care professional within the past year . Reports that she has not had a formal hearing evaluation within the past year *Unable to fully assess hearing and vision during telephone visit type  Cognitive Function: 6CIT Screen 07/31/2019  What time? 0 points  Count back from 20 0 points  Months in reverse 0 points  Repeat phrase 0 points   (Normal:0-7, Significant for Dysfunction: >8)  Normal Cognitive Function Screening: Yes   Immunization & Health Maintenance Record Immunization History  Administered Date(s) Administered  . Influenza,inj,Quad PF,6+ Mos 01/13/2017, 03/15/2018, 12/23/2018    Health Maintenance  Topic Date Due  . COVID-19 Vaccine (1) Never done  . PAP SMEAR-Modifier  Never done  . FOOT EXAM  06/09/2018  . OPHTHALMOLOGY EXAM  07/14/2018  . TETANUS/TDAP  07/24/2020 (Originally 11/12/1974)  . PNEUMOCOCCAL POLYSACCHARIDE VACCINE AGE 36-64 HIGH RISK  07/25/2020 (Originally 11/11/1957)  . INFLUENZA VACCINE  11/12/2019  . HEMOGLOBIN  A1C  01/25/2020  .  MAMMOGRAM  07/12/2021  . COLONOSCOPY  03/18/2026  . Hepatitis C Screening  Completed  . HIV Screening  Completed       Assessment  This is a routine wellness examination for Grace Stokes.  Health Maintenance: Due or Overdue Health Maintenance Due  Topic Date Due  . COVID-19 Vaccine (1) Never done  . PAP SMEAR-Modifier  Never done  . FOOT EXAM  06/09/2018  . OPHTHALMOLOGY EXAM  07/14/2018    Grace Stokes does not need a referral for Community Assistance: Care Management:   no Social Work:    no Prescription Assistance:  no Nutrition/Diabetes Education:  no   Plan:  Personalized Goals Goals Addressed            This Visit's Progress   . Prevent falls        Personalized Health Maintenance & Screening Recommendations  Pneumococcal vaccine  Td vaccine  Lung Cancer Screening Recommended: no (Low Dose CT Chest recommended if Age 56-80 years, 30 pack-year currently smoking OR have quit w/in past 15 years) Hepatitis C Screening recommended: no HIV Screening recommended: no  Advanced Directives: Written information was not prepared per patient's request.  Referrals & Orders No orders of the defined types were placed in this encounter.   Follow-up Plan . Follow-up with Claretta Fraise, MD as planned    I have personally reviewed and noted the following in the patient's chart:   . Medical and social history . Use of alcohol, tobacco or illicit drugs  . Current medications and supplements . Functional ability and status . Nutritional status . Physical activity . Advanced directives . List of other physicians . Hospitalizations, surgeries, and ER visits in previous 12 months . Vitals . Screenings to include cognitive, depression, and falls . Referrals and appointments  In addition, I have reviewed and discussed with Grace Stokes certain preventive protocols, quality metrics, and best practice recommendations. A written personalized care  plan for preventive services as well as general preventive health recommendations is available and can be mailed to the patient at her request.      [place] Dec  07/31/2019

## 2019-08-10 ENCOUNTER — Ambulatory Visit
Admission: RE | Admit: 2019-08-10 | Discharge: 2019-08-10 | Disposition: A | Discharge status: Home / Self Care | Payer: Medicare Other | Source: Ambulatory Visit | Attending: Nurse Practitioner | Admitting: Nurse Practitioner

## 2019-08-10 DIAGNOSIS — B181 Chronic viral hepatitis B without delta-agent: Secondary | ICD-10-CM

## 2019-08-10 DIAGNOSIS — B1910 Unspecified viral hepatitis B without hepatic coma: Secondary | ICD-10-CM | POA: Diagnosis not present

## 2019-08-10 DIAGNOSIS — K7689 Other specified diseases of liver: Secondary | ICD-10-CM | POA: Diagnosis not present

## 2019-09-07 ENCOUNTER — Encounter: Payer: Self-pay | Admitting: Family Medicine

## 2019-09-07 ENCOUNTER — Ambulatory Visit (INDEPENDENT_AMBULATORY_CARE_PROVIDER_SITE_OTHER): Payer: Medicare Other | Admitting: Family Medicine

## 2019-09-07 VITALS — BP 139/84 | HR 92 | Temp 97.9°F | Resp 20 | Ht 60.0 in | Wt 153.0 lb

## 2019-09-07 DIAGNOSIS — E118 Type 2 diabetes mellitus with unspecified complications: Secondary | ICD-10-CM | POA: Diagnosis not present

## 2019-09-07 DIAGNOSIS — E1165 Type 2 diabetes mellitus with hyperglycemia: Secondary | ICD-10-CM | POA: Diagnosis not present

## 2019-09-07 DIAGNOSIS — Z794 Long term (current) use of insulin: Secondary | ICD-10-CM | POA: Diagnosis not present

## 2019-09-07 DIAGNOSIS — IMO0002 Reserved for concepts with insufficient information to code with codable children: Secondary | ICD-10-CM

## 2019-09-12 ENCOUNTER — Encounter: Payer: Self-pay | Admitting: Family Medicine

## 2019-09-12 MED ORDER — INSULIN NPH (HUMAN) (ISOPHANE) 100 UNIT/ML ~~LOC~~ SUSP: SUBCUTANEOUS | 11 refills | Status: DC

## 2019-09-12 NOTE — Progress Notes (Signed)
 Subjective:  Patient ID: Phylliss Martin Pata, female    DOB: 08/01/1955  Age: 63 y.o. MRN: [id number]  CC: No chief complaint on file.   HPI Grace Stokes presents forFollow-up of diabetes. Patient checks blood sugar at home.  Log attached and reviewed.  Fasting sugars ranging from 200-400 and postprandials running from mid 200s to 340. Patient denies symptoms such as polyuria, polydipsia, excessive hunger, nausea No significant hypoglycemic spells noted. Medications reviewed. Pt reports taking them regularly without complication/adverse reaction being reported today.   History Toria has a past medical history of Arthritis, Bell's palsy, Cataract, Chronic pain, Renal disorder, Scoliosis, and Toxemia in pregnancy.   She has a past surgical history that includes Back surgery; Cesarean section; Tubal ligation; Shoulder surgery; Rotator cuff repair; Carpal tunnel release; Anterior cervical decomp/discectomy fusion (N/A, 07/09/2013); Incision / drainage hand / finger (Left, 12/17/2016); I & D extremity (Left, 12/17/2016); and Elbow surgery (Left).   Her family history includes Arthritis in her brother; COPD in her mother; Cancer in her brother; Obesity in her son; Stroke in her mother.She reports that she has been smoking cigarettes. She has been smoking about 0.50 packs per day. She has never used smokeless tobacco. She reports previous drug use. She reports that she does not drink alcohol.  Current Outpatient Medications on File Prior to Visit  Medication Sig Dispense Refill  . acetaminophen (TYLENOL) 325 MG tablet Take 2 tablets (650 mg total) by mouth every 6 (six) hours as needed for mild pain (or Fever >/= 101). 12 tablet 0  . amLODipine (NORVASC) 10 MG tablet Take 1 tablet (10 mg total) by mouth daily. For blood pressure 30 tablet 5  . blood glucose meter kit and supplies KIT Dispense based on patient and insurance preference. Use up to four times daily as directed. 1 each 0  .  cyclobenzaprine (FLEXERIL) 10 MG tablet Take 1 tablet (10 mg total) by mouth 2 (two) times daily as needed for muscle spasms. 60 tablet 5  . Insulin Syringe-Needle U-100 (SAFETY-GLIDE 0.3CC SYR 29GX1/2) 29G X 1/2" 0.3 ML MISC Use as directed, 2 times daily with meals. 100 each 0  . lisinopril (ZESTRIL) 20 MG tablet Take 1 tablet (20 mg total) by mouth daily. For kidney protection 90 tablet 1  . metoprolol succinate (TOPROL-XL) 100 MG 24 hr tablet Take 1 tablet (100 mg total) by mouth daily. For heart and blood pressure 90 tablet 1  . traZODone (DESYREL) 100 MG tablet Take 1 tablet (100 mg total) by mouth at bedtime. 30 tablet 5  . TRUEPLUS LANCETS 30G MISC Check blood sugar up to 4 times a day 200 each 1  . levETIRAcetam (KEPPRA) 500 MG tablet Take 1 tablet (500 mg total) by mouth 2 (two) times daily for 6 days. 12 tablet 0   No current facility-administered medications on file prior to visit.    ROS Review of Systems  Constitutional: Negative.   HENT: Negative.   Eyes: Negative for visual disturbance.  Respiratory: Negative for shortness of breath.   Cardiovascular: Negative for chest pain.  Gastrointestinal: Negative for abdominal pain.  Musculoskeletal: Negative for arthralgias.    Objective:  BP 139/84   Pulse 92   Temp 97.9 F (36.6 C) (Temporal)   Resp 20   Ht 5' (1.524 m)   Wt 153 lb (69.4 kg)   LMP 04/06/2011   SpO2 99%   BMI 29.88 kg/m   BP Readings from Last 3 Encounters:  09/07/19 139/84    07/31/19 133/72  07/26/19 133/72    Wt Readings from Last 3 Encounters:  09/07/19 153 lb (69.4 kg)  07/31/19 153 lb (69.4 kg)  07/26/19 153 lb 4 oz (69.5 kg)     Physical Exam Constitutional:      General: She is not in acute distress.    Appearance: She is well-developed.  Cardiovascular:     Rate and Rhythm: Normal rate and regular rhythm.  Pulmonary:     Breath sounds: Normal breath sounds.  Skin:    General: Skin is warm and dry.  Neurological:     Mental  Status: She is alert and oriented to person, place, and time.       Assessment & Plan:   Diagnoses and all orders for this visit:  Uncontrolled type 2 diabetes mellitus with complication, with long-term current use of insulin ([place]) -     Ambulatory referral to Podiatry -     insulin NPH Human (NOVOLIN N RELION) 100 UNIT/ML injection; Inject 0.4 mLs (40 Units total) into the skin daily before breakfast AND 0.55 mLs (55 Units total) daily with supper.      I have changed [name]. Pennino's insulin NPH Human. I am also having her maintain her TRUEplus Lancets 30G, Insulin Syringe-Needle U-100, levETIRAcetam, blood glucose meter kit and supplies, acetaminophen, amLODipine, cyclobenzaprine, lisinopril, metoprolol succinate, and traZODone.  Meds ordered this encounter  Medications  . insulin NPH Human (NOVOLIN N RELION) 100 UNIT/ML injection    Sig: Inject 0.4 mLs (40 Units total) into the skin daily before breakfast AND 0.55 mLs (55 Units total) daily with supper.    Dispense:  20 mL    Refill:  11     Follow-up: Return in about 1 month (around 10/08/2019).  Claretta Fraise, M.D.

## 2019-09-13 ENCOUNTER — Telehealth: Payer: Self-pay | Admitting: Family Medicine

## 2019-10-14 DIAGNOSIS — Z23 Encounter for immunization: Secondary | ICD-10-CM | POA: Diagnosis not present

## 2019-11-02 ENCOUNTER — Other Ambulatory Visit: Payer: Self-pay

## 2019-11-02 ENCOUNTER — Ambulatory Visit: Payer: Medicare Other | Admitting: Family Medicine

## 2019-11-06 ENCOUNTER — Other Ambulatory Visit: Payer: Self-pay | Admitting: *Deleted

## 2019-11-06 DIAGNOSIS — M4712 Other spondylosis with myelopathy, cervical region: Secondary | ICD-10-CM

## 2019-11-06 DIAGNOSIS — B181 Chronic viral hepatitis B without delta-agent: Secondary | ICD-10-CM | POA: Diagnosis not present

## 2019-11-06 MED ORDER — CYCLOBENZAPRINE HCL 10 MG PO TABS: 10.0000 mg | ORAL_TABLET | Freq: Two times a day (BID) | ORAL | 5 refills | Status: DC | PRN

## 2019-11-09 ENCOUNTER — Ambulatory Visit (INDEPENDENT_AMBULATORY_CARE_PROVIDER_SITE_OTHER): Payer: Medicare Other | Admitting: Family Medicine

## 2019-11-09 ENCOUNTER — Encounter: Payer: Self-pay | Admitting: Family Medicine

## 2019-11-09 ENCOUNTER — Other Ambulatory Visit: Payer: Self-pay

## 2019-11-09 VITALS — BP 138/70 | HR 74 | Temp 98.4°F | Ht 60.0 in | Wt 150.4 lb

## 2019-11-09 DIAGNOSIS — E118 Type 2 diabetes mellitus with unspecified complications: Secondary | ICD-10-CM | POA: Diagnosis not present

## 2019-11-09 DIAGNOSIS — K2100 Gastro-esophageal reflux disease with esophagitis, without bleeding: Secondary | ICD-10-CM

## 2019-11-09 DIAGNOSIS — M40294 Other kyphosis, thoracic region: Secondary | ICD-10-CM

## 2019-11-09 DIAGNOSIS — IMO0002 Reserved for concepts with insufficient information to code with codable children: Secondary | ICD-10-CM

## 2019-11-09 DIAGNOSIS — E1165 Type 2 diabetes mellitus with hyperglycemia: Secondary | ICD-10-CM | POA: Diagnosis not present

## 2019-11-09 DIAGNOSIS — I10 Essential (primary) hypertension: Secondary | ICD-10-CM

## 2019-11-09 DIAGNOSIS — Z794 Long term (current) use of insulin: Secondary | ICD-10-CM | POA: Diagnosis not present

## 2019-11-09 DIAGNOSIS — E782 Mixed hyperlipidemia: Secondary | ICD-10-CM | POA: Diagnosis not present

## 2019-11-09 LAB — BAYER DCA HB A1C WAIVED: HB A1C (BAYER DCA - WAIVED): 12.9 % — ABNORMAL HIGH (ref <7.0)

## 2019-11-09 MED ORDER — PANTOPRAZOLE SODIUM 40 MG PO TBEC: 40.0000 mg | DELAYED_RELEASE_TABLET | Freq: Every day | ORAL | 11 refills | Status: DC

## 2019-11-09 NOTE — Progress Notes (Signed)
Subjective:  Patient ID: Grace Stokes, female    DOB: 1955-08-15  Age: 64 y.o. MRN: [id number]  CC: Follow-up (Uncontrolled Diabetes )   HPI Grace Stokes presents forFollow-up of diabetes. Patient checks blood sugar at home.  Log provided showing that Grace Stokes fasting glucose is running in the 200-250 range on average and the postprandial and is running from high 200s to mid 300s. Patient denies symptoms such as polyuria, polydipsia, excessive hunger, nausea No significant hypoglycemic spells noted. Medications reviewed. Pt reports taking them regularly without complication/adverse reaction being reported today.   History Grace Stokes has a past medical history of Arthritis, Bell's palsy, Cataract, Chronic pain, Renal disorder, Scoliosis, and Toxemia in pregnancy.   Grace Stokes has a past surgical history that includes Back surgery; Cesarean section; Tubal ligation; Shoulder surgery; Rotator cuff repair; Carpal tunnel release; Anterior cervical decomp/discectomy fusion (N/A, 07/09/2013); Incision / drainage hand / finger (Left, 12/17/2016); I & D extremity (Left, 12/17/2016); and Elbow surgery (Left).   Grace Stokes family history includes Arthritis in Grace Stokes brother; COPD in Grace Stokes mother; Cancer in Grace Stokes brother; Obesity in Grace Stokes son; Stroke in Grace Stokes mother.Grace Stokes reports that Grace Stokes has been smoking cigarettes. Grace Stokes has been smoking about 0.50 packs per day. Grace Stokes has never used smokeless tobacco. Grace Stokes reports previous drug use. Grace Stokes reports that Grace Stokes does not drink alcohol.  Current Outpatient Medications on File Prior to Visit  Medication Sig Dispense Refill  . acetaminophen (TYLENOL) 325 MG tablet Take 2 tablets (650 mg total) by mouth every 6 (six) hours as needed for mild pain (or Fever >/= 101). 12 tablet 0  . amLODipine (NORVASC) 10 MG tablet Take 1 tablet (10 mg total) by mouth daily. For blood pressure 30 tablet 5  . blood glucose meter kit and supplies KIT Dispense based on patient and insurance preference. Use up to  four times daily as directed. 1 each 0  . cyclobenzaprine (FLEXERIL) 10 MG tablet Take 1 tablet (10 mg total) by mouth 2 (two) times daily as needed for muscle spasms. 60 tablet 5  . insulin NPH Human (NOVOLIN N RELION) 100 UNIT/ML injection Inject 0.4 mLs (40 Units total) into the skin daily before breakfast AND 0.55 mLs (55 Units total) daily with supper. 20 mL 11  . Insulin Syringe-Needle U-100 (SAFETY-GLIDE 0.3CC SYR 29GX1/2) 29G X 1/2" 0.3 ML MISC Use as directed, 2 times daily with meals. 100 each 0  . lisinopril (ZESTRIL) 20 MG tablet Take 1 tablet (20 mg total) by mouth daily. For kidney protection 90 tablet 1  . metoprolol succinate (TOPROL-XL) 100 MG 24 hr tablet Take 1 tablet (100 mg total) by mouth daily. For heart and blood pressure 90 tablet 1  . traZODone (DESYREL) 100 MG tablet Take 1 tablet (100 mg total) by mouth at bedtime. 30 tablet 5  . TRUEPLUS LANCETS 30G MISC Check blood sugar up to 4 times a day 200 each 1  . levETIRAcetam (KEPPRA) 500 MG tablet Take 1 tablet (500 mg total) by mouth 2 (two) times daily for 6 days. 12 tablet 0   No current facility-administered medications on file prior to visit.    ROS Review of Systems  Constitutional: Negative.   HENT: Negative for congestion.   Eyes: Negative for visual disturbance.  Respiratory: Negative for shortness of breath.   Cardiovascular: Negative for chest pain.  Gastrointestinal: Positive for abdominal pain (Occasional heartburn which responds nicely to Tums taking 1 mL every other day.). Negative for constipation, diarrhea, nausea and vomiting.  Genitourinary: Negative for difficulty urinating.  Musculoskeletal: Negative for arthralgias and myalgias.  Neurological: Negative for headaches.  Psychiatric/Behavioral: Negative for sleep disturbance.    Objective:  BP (!) 138/70   Pulse 74   Temp 98.4 F (36.9 C) (Temporal)   Ht 5' (1.524 m)   Wt 150 lb 6.4 oz (68.2 kg)   LMP 04/06/2011   BMI 29.37 kg/m   BP  Readings from Last 3 Encounters:  11/09/19 (!) 138/70  09/07/19 139/84  07/31/19 133/72    Wt Readings from Last 3 Encounters:  11/09/19 150 lb 6.4 oz (68.2 kg)  09/07/19 153 lb (69.4 kg)  07/31/19 153 lb (69.4 kg)     Physical Exam Constitutional:      General: Grace Stokes is not in acute distress.    Appearance: Grace Stokes is well-developed.  Cardiovascular:     Rate and Rhythm: Normal rate and regular rhythm.  Pulmonary:     Breath sounds: Normal breath sounds.  Musculoskeletal:        General: Deformity (Severe kyphosis) present.  Skin:    General: Skin is warm and dry.  Neurological:     Mental Status: Grace Stokes is alert and oriented to person, place, and time.       Assessment & Plan:   Grace Stokes was seen today for follow-up.  Diagnoses and all orders for this visit:  Uncontrolled type 2 diabetes mellitus with complication, with long-term current use of insulin ([facility]) -     CBC with Differential/Platelet -     CMP14+EGFR -     Microalbumin / creatinine urine ratio -     Bayer DCA Hb A1c Waived  Essential hypertension -     CBC with Differential/Platelet -     CMP14+EGFR -     Microalbumin / creatinine urine ratio -     Bayer DCA Hb A1c Waived  Mixed hyperlipidemia -     CBC with Differential/Platelet -     CMP14+EGFR -     Microalbumin / creatinine urine ratio -     Bayer DCA Hb A1c Waived  Gastroesophageal reflux disease with esophagitis without hemorrhage -     pantoprazole (PROTONIX) 40 MG tablet; Take 1 tablet (40 mg total) by mouth daily. For stomach  Other kyphosis of thoracic region      I am having Grace Stokes. Grace Stokes start on pantoprazole. I am also having Grace Stokes maintain Grace Stokes TRUEplus Lancets 30G, Insulin Syringe-Needle U-100, levETIRAcetam, blood glucose meter kit and supplies, acetaminophen, amLODipine, lisinopril, metoprolol succinate, traZODone, insulin NPH Human, and cyclobenzaprine.  Meds ordered this encounter  Medications  . pantoprazole (PROTONIX) 40 MG  tablet    Sig: Take 1 tablet (40 mg total) by mouth daily. For stomach    Dispense:  30 tablet    Refill:  11    I arrange for Grace Stokes to meet with Lottie Dawson briefly today.  They will need for a formal consultation in just a few days.  Changes in Grace Stokes diabetic regimen will be evaluated at that time. Follow-up: Return in about 1 month (around 12/10/2019).  Claretta Fraise, M.D.

## 2019-11-10 LAB — CMP14+EGFR
ALT: 42 IU/L — ABNORMAL HIGH (ref 0–32)
AST: 53 IU/L — ABNORMAL HIGH (ref 0–40)
Albumin/Globulin Ratio: 0.7 — ABNORMAL LOW (ref 1.2–2.2)
Albumin: 3.4 g/dL — ABNORMAL LOW (ref 3.8–4.8)
Alkaline Phosphatase: 194 IU/L — ABNORMAL HIGH (ref 48–121)
BUN/Creatinine Ratio: 15 (ref 12–28)
BUN: 8 mg/dL (ref 8–27)
Bilirubin Total: 0.7 mg/dL (ref 0.0–1.2)
CO2: 26 mmol/L (ref 20–29)
Calcium: 8.8 mg/dL (ref 8.7–10.3)
Chloride: 100 mmol/L (ref 96–106)
Creatinine, Ser: 0.54 mg/dL — ABNORMAL LOW (ref 0.57–1.00)
GFR calc Af Amer: 116 mL/min/{1.73_m2} (ref >59)
GFR calc non Af Amer: 101 mL/min/{1.73_m2} (ref >59)
Globulin, Total: 5 g/dL — ABNORMAL HIGH (ref 1.5–4.5)
Glucose: 373 mg/dL — ABNORMAL HIGH (ref 65–99)
Potassium: 3.8 mmol/L (ref 3.5–5.2)
Sodium: 134 mmol/L (ref 134–144)
Total Protein: 8.4 g/dL (ref 6.0–8.5)

## 2019-11-10 LAB — CBC WITH DIFFERENTIAL/PLATELET
Basophils Absolute: 0 10*3/uL (ref 0.0–0.2)
Basos: 1 %
EOS (ABSOLUTE): 0.1 10*3/uL (ref 0.0–0.4)
Eos: 2 %
Hematocrit: 38.3 % (ref 34.0–46.6)
Hemoglobin: 13.2 g/dL (ref 11.1–15.9)
Immature Grans (Abs): 0 10*3/uL (ref 0.0–0.1)
Immature Granulocytes: 0 %
Lymphocytes Absolute: 0.9 10*3/uL (ref 0.7–3.1)
Lymphs: 40 %
MCH: 31.3 pg (ref 26.6–33.0)
MCHC: 34.5 g/dL (ref 31.5–35.7)
MCV: 91 fL (ref 79–97)
Monocytes Absolute: 0.3 10*3/uL (ref 0.1–0.9)
Monocytes: 10 %
Neutrophils Absolute: 1.3 10*3/uL — ABNORMAL LOW (ref 1.4–7.0)
Neutrophils: 47 %
Platelets: 52 10*3/uL — CL (ref 150–450)
RBC: 4.22 x10E6/uL (ref 3.77–5.28)
RDW: 12.2 % (ref 11.7–15.4)
WBC: 2.5 10*3/uL — CL (ref 3.4–10.8)

## 2019-11-11 DIAGNOSIS — Z23 Encounter for immunization: Secondary | ICD-10-CM | POA: Diagnosis not present

## 2019-11-14 ENCOUNTER — Ambulatory Visit (INDEPENDENT_AMBULATORY_CARE_PROVIDER_SITE_OTHER): Payer: Medicare Other | Admitting: Pharmacist

## 2019-11-14 ENCOUNTER — Other Ambulatory Visit: Payer: Self-pay

## 2019-11-14 DIAGNOSIS — N183 Chronic kidney disease, stage 3 unspecified: Secondary | ICD-10-CM

## 2019-11-14 DIAGNOSIS — E1122 Type 2 diabetes mellitus with diabetic chronic kidney disease: Secondary | ICD-10-CM

## 2019-11-14 NOTE — Progress Notes (Signed)
    11/14/2019 Name: Grace Stokes MRN: [id number] DOB: Jan 15, 1956   S:  57 yoF presents for diabetes evaluation, education, and management Patient was referred and last seen by Primary Care Provider on 11/09/19.  Patient in extreme pain and needs to reschedule.  Encouraged patient to call back to reschedule appt    Regina Eck, PharmD, BCPS Clinical Pharmacist, [place]  II Phone [phone]

## 2019-12-07 ENCOUNTER — Ambulatory Visit: Payer: Medicare Other | Admitting: Family Medicine

## 2019-12-11 ENCOUNTER — Encounter: Payer: Self-pay | Admitting: Family Medicine

## 2020-01-10 DIAGNOSIS — Z9114 Patient's other noncompliance with medication regimen: Secondary | ICD-10-CM | POA: Diagnosis not present

## 2020-01-10 DIAGNOSIS — B181 Chronic viral hepatitis B without delta-agent: Secondary | ICD-10-CM | POA: Diagnosis not present

## 2020-01-10 DIAGNOSIS — K7400 Hepatic fibrosis, unspecified: Secondary | ICD-10-CM | POA: Diagnosis not present

## 2020-04-04 DIAGNOSIS — Z029 Encounter for administrative examinations, unspecified: Secondary | ICD-10-CM

## 2020-04-25 DIAGNOSIS — B181 Chronic viral hepatitis B without delta-agent: Secondary | ICD-10-CM | POA: Diagnosis not present

## 2020-04-25 DIAGNOSIS — Z9114 Patient's other noncompliance with medication regimen: Secondary | ICD-10-CM | POA: Diagnosis not present

## 2020-04-25 DIAGNOSIS — F1910 Other psychoactive substance abuse, uncomplicated: Secondary | ICD-10-CM | POA: Diagnosis not present

## 2020-04-26 ENCOUNTER — Other Ambulatory Visit: Payer: Self-pay | Admitting: Nurse Practitioner

## 2020-04-26 DIAGNOSIS — B181 Chronic viral hepatitis B without delta-agent: Secondary | ICD-10-CM

## 2020-07-03 ENCOUNTER — Ambulatory Visit
Admission: RE | Admit: 2020-07-03 | Discharge: 2020-07-03 | Disposition: A | Discharge status: Home / Self Care | Payer: Medicare Other | Source: Ambulatory Visit | Attending: Nurse Practitioner | Admitting: Nurse Practitioner

## 2020-07-03 DIAGNOSIS — K828 Other specified diseases of gallbladder: Secondary | ICD-10-CM | POA: Diagnosis not present

## 2020-07-03 DIAGNOSIS — B181 Chronic viral hepatitis B without delta-agent: Secondary | ICD-10-CM

## 2020-07-03 DIAGNOSIS — K7689 Other specified diseases of liver: Secondary | ICD-10-CM | POA: Diagnosis not present

## 2020-07-04 ENCOUNTER — Other Ambulatory Visit: Payer: Self-pay | Admitting: Family Medicine

## 2020-07-04 DIAGNOSIS — I10 Essential (primary) hypertension: Secondary | ICD-10-CM

## 2020-08-14 ENCOUNTER — Other Ambulatory Visit: Payer: Self-pay | Admitting: Family Medicine

## 2020-08-14 DIAGNOSIS — I10 Essential (primary) hypertension: Secondary | ICD-10-CM

## 2020-08-14 NOTE — Telephone Encounter (Signed)
Stacks. NTBS 30 days given 07/04/20

## 2020-08-14 NOTE — Telephone Encounter (Signed)
Called patient, no answer 

## 2020-08-26 ENCOUNTER — Other Ambulatory Visit: Payer: Self-pay

## 2020-08-26 DIAGNOSIS — Z1231 Encounter for screening mammogram for malignant neoplasm of breast: Secondary | ICD-10-CM

## 2020-10-22 ENCOUNTER — Other Ambulatory Visit: Payer: Self-pay

## 2020-10-22 ENCOUNTER — Ambulatory Visit
Admission: RE | Admit: 2020-10-22 | Discharge: 2020-10-22 | Disposition: A | Discharge status: Home / Self Care | Payer: Medicare HMO | Source: Ambulatory Visit | Attending: *Deleted | Admitting: *Deleted

## 2020-10-22 DIAGNOSIS — Z1231 Encounter for screening mammogram for malignant neoplasm of breast: Secondary | ICD-10-CM

## 2020-10-23 DIAGNOSIS — B181 Chronic viral hepatitis B without delta-agent: Secondary | ICD-10-CM | POA: Diagnosis not present

## 2020-10-23 DIAGNOSIS — F1911 Other psychoactive substance abuse, in remission: Secondary | ICD-10-CM | POA: Insufficient documentation

## 2020-10-23 DIAGNOSIS — K7460 Unspecified cirrhosis of liver: Secondary | ICD-10-CM | POA: Insufficient documentation

## 2020-10-23 DIAGNOSIS — K7469 Other cirrhosis of liver: Secondary | ICD-10-CM | POA: Insufficient documentation

## 2020-10-23 DIAGNOSIS — F1910 Other psychoactive substance abuse, uncomplicated: Secondary | ICD-10-CM | POA: Diagnosis not present

## 2020-10-23 DIAGNOSIS — K921 Melena: Secondary | ICD-10-CM | POA: Diagnosis not present

## 2020-10-24 DIAGNOSIS — B181 Chronic viral hepatitis B without delta-agent: Secondary | ICD-10-CM | POA: Diagnosis not present

## 2020-10-24 DIAGNOSIS — F1110 Opioid abuse, uncomplicated: Secondary | ICD-10-CM | POA: Diagnosis not present

## 2020-12-11 ENCOUNTER — Encounter: Payer: Self-pay | Admitting: Family Medicine

## 2020-12-11 ENCOUNTER — Ambulatory Visit (INDEPENDENT_AMBULATORY_CARE_PROVIDER_SITE_OTHER): Payer: Medicare HMO | Admitting: Family Medicine

## 2020-12-11 ENCOUNTER — Other Ambulatory Visit: Payer: Self-pay

## 2020-12-11 VITALS — BP 152/92 | HR 100 | Temp 97.1°F | Ht 61.0 in | Wt 131.0 lb

## 2020-12-11 DIAGNOSIS — R300 Dysuria: Secondary | ICD-10-CM | POA: Diagnosis not present

## 2020-12-11 DIAGNOSIS — F331 Major depressive disorder, recurrent, moderate: Secondary | ICD-10-CM | POA: Diagnosis not present

## 2020-12-11 DIAGNOSIS — N3001 Acute cystitis with hematuria: Secondary | ICD-10-CM

## 2020-12-11 LAB — URINALYSIS, ROUTINE W REFLEX MICROSCOPIC
Bilirubin, UA: NEGATIVE
Nitrite, UA: POSITIVE — AB
Specific Gravity, UA: 1.015 (ref 1.005–1.030)
Urobilinogen, Ur: >8 mg/dL — ABNORMAL HIGH (ref 0.2–1.0)
pH, UA: 6 (ref 5.0–7.5)

## 2020-12-11 LAB — MICROSCOPIC EXAMINATION

## 2020-12-11 MED ORDER — CEFDINIR 300 MG PO CAPS: 300.0000 mg | ORAL_CAPSULE | Freq: Two times a day (BID) | ORAL | 0 refills | Status: AC

## 2020-12-11 NOTE — Progress Notes (Signed)
Assessment & Plan:  1. Acute cystitis with hematuria - encouraged proper hygiene - Education provided on UTIs - cefdinir (OMNICEF) 300 MG capsule; Take 1 capsule (300 mg total) by mouth 2 (two) times daily for 10 days.  Dispense: 20 capsule; Refill: 0  2. Dysuria - cefdinir (OMNICEF) 300 MG capsule; Take 1 capsule (300 mg total) by mouth 2 (two) times daily for 10 days.  Dispense: 20 capsule; Refill: 0 - Urinalysis, Routine w reflex microscopic, Urine dipstick shows positive for WBC's, positive for RBC's, positive for protein, positive for glucose, positive for nitrates, positive for leukocytes, positive for urobilinogen, and positive for ketones.  Micro exam: >30 WBC's per HPF and moderate bacteria.  3. Moderate episode of recurrent major depressive disorder ([place]) - Advised to follow-up with PCP for treatment.   Follow up plan: Return ASAP, for follow-up of chronic medication conditions.  Lucile Crater, NP Student  I personally was present during the history, physical exam, and medical decision-making activities of this service and have verified that the service and findings are accurately documented in the nurse practitioner student's note.  Hendricks Limes, MSN, APRN, FNP-C [facility]   Subjective:   Patient ID: Grace Stokes, female    DOB: December 13, 1955, 65 y.o.   MRN: [id number]  HPI: Grace Stokes is a 65 y.o. female presenting on 12/11/2020 for Dysuria (X 2 days/)  Urinary Tract Infection: Patient complains of dysuria, bilateral flank pain, frequency, incontinence, and urgency She has had symptoms for 2 days.  Patient does have a history of recurrent UTI, she states she had ureteral stents placed when she was 65 years old and has recurrent UTIs all her life.  Patient does have a history of pyelonephritis. She has been taking phenazopyridine hydrochloride OTC for dysuria pain relief.   Depression: She states that she has a poor quality of life  and can't do the things the used to do and enjoy. She is worried about her health and her husband's health. She states she is very tired. She states she has thoughts that she would be better off dead but denies a plan and denies thoughts of hurting herself because of her beliefs.  Depression screen [facility] 2/9 12/11/2020 11/09/2019 09/07/2019  Decreased Interest 2 0 0  Down, Depressed, Hopeless 2 0 0  PHQ - 2 Score 4 0 0  Altered sleeping 3 - -  Tired, decreased energy 0 - -  Change in appetite 1 - -  Feeling bad or failure about yourself  3 - -  Trouble concentrating 2 - -  Moving slowly or fidgety/restless 1 - -  Suicidal thoughts 1 - -  PHQ-9 Score 15 - -    ROS: Negative unless specifically indicated above in HPI.   Relevant past medical history reviewed and updated as indicated.   Allergies and medications reviewed and updated.   Current Outpatient Medications:    acetaminophen (TYLENOL) 325 MG tablet, Take 2 tablets (650 mg total) by mouth every 6 (six) hours as needed for mild pain (or Fever >/= 101)., Disp: 12 tablet, Rfl: 0   amLODipine (NORVASC) 10 MG tablet, Take 1 tablet (10 mg total) by mouth daily. for blood pressure (NEEDS TO BE SEEN BEFORE NEXT REFILL), Disp: 30 tablet, Rfl: 0   blood glucose meter kit and supplies KIT, Dispense based on patient and insurance preference. Use up to four times daily as directed., Disp: 1 each, Rfl: 0   cyclobenzaprine (FLEXERIL) 10 MG tablet, Take  1 tablet (10 mg total) by mouth 2 (two) times daily as needed for muscle spasms., Disp: 60 tablet, Rfl: 5   insulin NPH Human (NOVOLIN N RELION) 100 UNIT/ML injection, Inject 0.4 mLs (40 Units total) into the skin daily before breakfast AND 0.55 mLs (55 Units total) daily with supper., Disp: 20 mL, Rfl: 11   Insulin Syringe-Needle U-100 (SAFETY-GLIDE 0.3CC SYR 29GX1/2) 29G X 1/2" 0.3 ML MISC, Use as directed, 2 times daily with meals., Disp: 100 each, Rfl: 0   lisinopril (ZESTRIL) 20 MG tablet, Take 1  tablet (20 mg total) by mouth daily. (NEEDS TO BE SEEN BEFORE NEXT REFILL), Disp: 30 tablet, Rfl: 0   metoprolol succinate (TOPROL-XL) 100 MG 24 hr tablet, Take 1 tablet (100 mg total) by mouth daily. (NEEDS TO BE SEEN BEFORE NEXT REFILL), Disp: 90 tablet, Rfl: 0   pantoprazole (PROTONIX) 40 MG tablet, Take 1 tablet (40 mg total) by mouth daily. For stomach, Disp: 30 tablet, Rfl: 11   traZODone (DESYREL) 100 MG tablet, Take 1 tablet (100 mg total) by mouth at bedtime., Disp: 30 tablet, Rfl: 5   TRUEPLUS LANCETS 30G MISC, Check blood sugar up to 4 times a day, Disp: 200 each, Rfl: 1   levETIRAcetam (KEPPRA) 500 MG tablet, Take 1 tablet (500 mg total) by mouth 2 (two) times daily for 6 days., Disp: 12 tablet, Rfl: 0  Allergies  Allergen Reactions   Gabapentin     Uncontrolled shakes / cold intolerance     Objective:   BP (!) 156/90   Pulse 100   Temp (!) 97.1 F (36.2 C) (Temporal)   Ht _0  (1.549 m)   Wt 59.4 kg   LMP 04/06/2011   BMI 24.75 kg/m    Physical Exam Constitutional:      General: She is not in acute distress.    Appearance: She is obese. She is ill-appearing. She is not toxic-appearing or diaphoretic.  HENT:     Head: Normocephalic.  Pulmonary:     Effort: Pulmonary effort is normal.  Abdominal:     Tenderness: There is right CVA tenderness and left CVA tenderness.  Musculoskeletal:     Thoracic back: Scoliosis present.     Comments: Walks with a cane  Skin:    General: Skin is warm and dry.  Neurological:     Mental Status: She is alert and oriented to person, place, and time. Mental status is at baseline.  Psychiatric:        Behavior: Behavior normal.        Thought Content: Thought content normal.        Judgment: Judgment normal.

## 2020-12-11 NOTE — Addendum Note (Signed)
Addended by: Karle Plumber on: 12/11/2020 02:38 PM   Modules accepted: Orders

## 2020-12-14 LAB — URINE CULTURE

## 2020-12-24 ENCOUNTER — Ambulatory Visit: Payer: Medicare HMO | Admitting: Family Medicine

## 2020-12-24 ENCOUNTER — Other Ambulatory Visit: Payer: Self-pay

## 2020-12-25 ENCOUNTER — Ambulatory Visit (INDEPENDENT_AMBULATORY_CARE_PROVIDER_SITE_OTHER): Payer: Medicare HMO

## 2020-12-25 VITALS — Ht 61.0 in | Wt 131.0 lb

## 2020-12-25 DIAGNOSIS — Z599 Problem related to housing and economic circumstances, unspecified: Secondary | ICD-10-CM | POA: Diagnosis not present

## 2020-12-25 DIAGNOSIS — Z5941 Food insecurity: Secondary | ICD-10-CM | POA: Diagnosis not present

## 2020-12-25 DIAGNOSIS — Z9114 Patient's other noncompliance with medication regimen: Secondary | ICD-10-CM

## 2020-12-25 DIAGNOSIS — Z0001 Encounter for general adult medical examination with abnormal findings: Secondary | ICD-10-CM

## 2020-12-25 DIAGNOSIS — Z0000 Encounter for general adult medical examination without abnormal findings: Secondary | ICD-10-CM

## 2020-12-25 NOTE — Progress Notes (Signed)
Subjective:   Grace Stokes is a 65 y.o. female who presents for Medicare Annual (Subsequent) preventive examination.  Virtual Visit via Telephone Note  I connected with  Grace Stokes on 12/25/20 at  1:15 PM EDT by telephone and verified that I am speaking with the correct person using two identifiers.  Location: Patient: Home Provider: [facility] Persons participating in the virtual visit: patient/Nurse Health Advisor   I discussed the limitations, risks, security and privacy concerns of performing an evaluation and management service by telephone and the availability of in person appointments. The patient expressed understanding and agreed to proceed.  Interactive audio and video telecommunications were attempted between this nurse and patient, however failed, due to patient having technical difficulties OR patient did not have access to video capability.  We continued and completed visit with audio only.  Some vital signs may be absent or patient reported.   Grace Stokes E Skii Cleland, LPN   Review of Systems     Cardiac Risk Factors include: advanced age (>11mn, >>15women);diabetes mellitus;dyslipidemia;hypertension;obesity (BMI >30kg/m2);sedentary lifestyle     Objective:    Today's Vitals   12/25/20 1324 12/25/20 1325  Weight: 131 lb (59.4 kg)   Height: '5\' 1"'  (1.549 m)   PainSc:  8    Body mass index is 24.75 kg/m.  Advanced Directives 12/25/2020 07/31/2019 02/04/2019 02/03/2019 12/22/2018 12/17/2016 12/17/2016  Does Patient Have a Medical Advance Directive? Yes Yes No No No - No  Type of Advance Directive Living will;Healthcare Power of Attorney Living will;Healthcare Power of Attorney - - - - -  Does patient want to make changes to medical advance directive? - No - Patient declined - - - No - Patient declined No - Patient declined  Copy of [place]? No - copy requested No - copy requested - - - - -  Would patient like information on creating a  medical advance directive? - No - Patient declined No - Patient declined - No - Patient declined No - Patient declined -    Current Medications (verified) Outpatient Encounter Medications as of 12/25/2020  Medication Sig   acetaminophen (TYLENOL) 325 MG tablet Take 2 tablets (650 mg total) by mouth every 6 (six) hours as needed for mild pain (or Fever >/= 101).   amLODipine (NORVASC) 10 MG tablet Take 1 tablet (10 mg total) by mouth daily. for blood pressure (NEEDS TO BE SEEN BEFORE NEXT REFILL)   blood glucose meter kit and supplies KIT Dispense based on patient and insurance preference. Use up to four times daily as directed.   cyclobenzaprine (FLEXERIL) 10 MG tablet Take 1 tablet (10 mg total) by mouth 2 (two) times daily as needed for muscle spasms.   insulin NPH Human (NOVOLIN N RELION) 100 UNIT/ML injection Inject 0.4 mLs (40 Units total) into the skin daily before breakfast AND 0.55 mLs (55 Units total) daily with supper.   Insulin Syringe-Needle U-100 (SAFETY-GLIDE 0.3CC SYR 29GX1/2) 29G X 1/2" 0.3 ML MISC Use as directed, 2 times daily with meals.   levETIRAcetam (KEPPRA) 500 MG tablet Take 1 tablet (500 mg total) by mouth 2 (two) times daily for 6 days.   lisinopril (ZESTRIL) 20 MG tablet Take 1 tablet (20 mg total) by mouth daily. (NEEDS TO BE SEEN BEFORE NEXT REFILL)   metoprolol succinate (TOPROL-XL) 100 MG 24 hr tablet Take 1 tablet (100 mg total) by mouth daily. (NEEDS TO BE SEEN BEFORE NEXT REFILL)   pantoprazole (PROTONIX) 40 MG  tablet Take 1 tablet (40 mg total) by mouth daily. For stomach   traZODone (DESYREL) 100 MG tablet Take 1 tablet (100 mg total) by mouth at bedtime.   TRUEPLUS LANCETS 30G MISC Check blood sugar up to 4 times a day   No facility-administered encounter medications on file as of 12/25/2020.    Allergies (verified) Gabapentin   History: Past Medical History:  Diagnosis Date   Arthritis    Bell's palsy    neuropathy    Cataract    Chronic pain     Renal disorder    Scoliosis    Toxemia in pregnancy    Past Surgical History:  Procedure Laterality Date   ANTERIOR CERVICAL DECOMP/DISCECTOMY FUSION N/A 07/09/2013   Procedure: ANTERIOR CERVICAL DECOMPRESSION/DISCECTOMY FUSION 2 LEVELS;  Surgeon: Consuella Lose, MD;  Location: [facility];  Service: Neurosurgery;  Laterality: N/A;  Anterior Cervical Fusion Cervical five-six, six-seven.   BACK SURGERY     CARPAL TUNNEL RELEASE     CESAREAN SECTION     ELBOW SURGERY Left    I & D EXTREMITY Left 12/17/2016   Procedure: IRRIGATION AND DEBRIDEMENT LEFT HAND;  Surgeon: Iran Planas, MD;  Location: [facility];  Service: Orthopedics;  Laterality: Left;   INCISION / DRAINAGE HAND / FINGER Left 12/17/2016   ROTATOR CUFF REPAIR     SHOULDER SURGERY     TUBAL LIGATION     Family History  Problem Relation Age of Onset   COPD Mother    Stroke Mother    Arthritis Brother        back issues   Cancer Brother        skin / back    Obesity Son    Social History   Socioeconomic History   Marital status: Married    Spouse name: Tim   Number of children: 1   Years of education: Not on file   Highest education level: Not on file  Occupational History   Occupation: disability   Tobacco Use   Smoking status: Some Days    Packs/day: 0.50    Types: Cigarettes   Smokeless tobacco: Never  Vaping Use   Vaping Use: Never used  Substance and Sexual Activity   Alcohol use: No   Drug use: Not Currently    Comment: in past    Sexual activity: Not on file  Other Topics Concern   Not on file  Social History Narrative   Son lives 15 minutes away and hasn't visited in over a year.   Social Determinants of Health   Financial Resource Strain: High Risk   Difficulty of Paying Living Expenses: Hard  Food Insecurity: Food Insecurity Present   Worried About Running Out of Food in the Last Year: Sometimes true   Ran Out of Food in the Last Year: Sometimes true  Transportation Needs: No Transportation  Needs   Lack of Transportation (Medical): No   Lack of Transportation (Non-Medical): No  Physical Activity: Inactive   Days of Exercise per Week: 0 days   Minutes of Exercise per Session: 0 min  Stress: Stress Concern Present   Feeling of Stress : To some extent  Social Connections: Moderately Isolated   Frequency of Communication with Friends and Family: More than three times a week   Frequency of Social Gatherings with Friends and Family: Twice a week   Attends Religious Services: Never   Marine scientist or Organizations: No   Attends Archivist Meetings: Never  Marital Status: Married    Tobacco Counseling Ready to quit: Not Answered Counseling given: Not Answered   Clinical Intake:  Pre-visit preparation completed: Yes  Pain : 0-10 Pain Score: 8  Pain Type: Chronic pain Pain Location: Back Pain Descriptors / Indicators: Aching, Discomfort, Nagging Pain Onset: More than a month ago Pain Frequency: Intermittent     BMI - recorded: 24.75 Nutritional Status: BMI of 19-24  Normal Nutritional Risks: None Diabetes: Yes CBG done?: No Did pt. bring in CBG monitor from home?: No  How often do you need to have someone help you when you read instructions, pamphlets, or other written materials from your doctor or pharmacy?: 1 - Never  Diabetic? Yes  Nutrition Risk Assessment:  Has the patient had any N/V/D within the last 2 months?  No  Does the patient have any non-healing wounds?  No  Has the patient had any unintentional weight loss or weight gain?  No   Diabetes:  Is the patient diabetic?  Yes  If diabetic, was a CBG obtained today?  No  Did the patient bring in their glucometer from home?  No  How often do you monitor your CBG's? She hasn't been checking it - doesn't have the money to get a new one and hers broke.   Financial Strains and Diabetes Management:  Are you having any financial strains with the device, your supplies or your  medication? Yes .  Does the patient want to be seen by Chronic Care Management for management of their diabetes?  Yes  Would the patient like to be referred to a Nutritionist or for Diabetic Management?  No   Diabetic Exams:  Diabetic Eye Exam: Completed 2021. Overdue for diabetic eye exam. Pt has been advised about the importance in completing this exam. She plans to make appt soon  Diabetic Foot Exam: Completed 06/09/2017. Pt has been advised about the importance in completing this exam. Pt is scheduled for diabetic foot exam on 01/16/2021.    Interpreter Needed?: No  Information entered by :: Honestie Kulik, LPN   Activities of Daily Living In your present state of health, do you have any difficulty performing the following activities: 12/25/2020  Hearing? N  Vision? N  Difficulty concentrating or making decisions? Y  Walking or climbing stairs? Y  Dressing or bathing? N  Doing errands, shopping? N  Preparing Food and eating ? N  Using the Toilet? N  In the past six months, have you accidently leaked urine? Y  Comment wears overnight depends plus pads  Do you have problems with loss of bowel control? Y  Comment is f/u with specialist at [place] your Medications? N  Managing your Finances? N  Housekeeping or managing your Housekeeping? Y  Some recent data might be hidden    Patient Care Team: Loman Brooklyn, FNP as PCP - General (Family Medicine) Roosevelt Locks, CRNP as Nurse Practitioner (Nephrology)  Indicate any recent Medical Services you may have received from other than [facility] providers in the past year (date may be approximate).     Assessment:   This is a routine wellness examination for Grace Stokes.  Hearing/Vision screen Hearing Screening - Comments:: Denies hearing difficulties Vision Screening - Comments:: Wears eyeglasses - up to date with annual eye exams with [facility]  Dietary issues and exercise activities discussed: Current Exercise  Habits: The patient does not participate in regular exercise at present, Exercise limited by: orthopedic condition(s)   Goals Addressed  This Visit's Progress    Prevent falls   On track      Depression Screen PHQ 2/9 Scores 12/25/2020 12/11/2020 11/09/2019 09/07/2019 07/31/2019 07/26/2019 04/18/2019  PHQ - 2 Score 4 4 0 0 0 1 0  PHQ- 9 Score 15 15 - - - - -    Fall Risk Fall Risk  12/25/2020 12/11/2020 11/09/2019 07/31/2019 07/26/2019  Falls in the past year? 0 0 0 0 0  Number falls in past yr: 0 - 0 - -  Injury with Fall? 0 - 0 - -  Risk for fall due to : Impaired balance/gait;Impaired vision;Medication side effect;Orthopedic patient - Impaired balance/gait;Impaired mobility;Medication side effect - -  Follow up Education provided;Falls prevention discussed - Falls evaluation completed - -    FALL RISK PREVENTION PERTAINING TO THE HOME:  Any stairs in or around the home? No  If so, are there any without handrails? No  Home free of loose throw rugs in walkways, pet beds, electrical cords, etc? Yes  Adequate lighting in your home to reduce risk of falls? Yes   ASSISTIVE DEVICES UTILIZED TO PREVENT FALLS:  Life alert? No  Use of a cane, walker or w/c? Yes  Grab bars in the bathroom? Yes  Shower chair or bench in shower? Yes  Elevated toilet seat or a handicapped toilet? Yes   TIMED UP AND GO:  Was the test performed? No . Telephonic visit  Cognitive Function:     6CIT Screen 12/25/2020 07/31/2019  What Year? 0 points -  What month? 0 points -  What time? 0 points 0 points  Count back from 20 0 points 0 points  Months in reverse 0 points 0 points  Repeat phrase 0 points 0 points  Total Score 0 -    Immunizations Immunization History  Administered Date(s) Administered   Influenza,inj,Quad PF,6+ Mos 01/13/2017, 03/15/2018, 12/23/2018    TDAP status: Due, Education has been provided regarding the importance of this vaccine. Advised may receive this vaccine at  local pharmacy or Health Dept. Aware to provide a copy of the vaccination record if obtained from local pharmacy or Health Dept. Verbalized acceptance and understanding.  Flu Vaccine status: Due, Education has been provided regarding the importance of this vaccine. Advised may receive this vaccine at local pharmacy or Health Dept. Aware to provide a copy of the vaccination record if obtained from local pharmacy or Health Dept. Verbalized acceptance and understanding.  Pneumococcal vaccine status: Due, Education has been provided regarding the importance of this vaccine. Advised may receive this vaccine at local pharmacy or Health Dept. Aware to provide a copy of the vaccination record if obtained from local pharmacy or Health Dept. Verbalized acceptance and understanding.  Covid-19 vaccine status: Completed vaccines  Qualifies for Shingles Vaccine? Yes   Zostavax completed No   Shingrix Completed?: No.    Education has been provided regarding the importance of this vaccine. Patient has been advised to call insurance company to determine out of pocket expense if they have not yet received this vaccine. Advised may also receive vaccine at local pharmacy or Health Dept. Verbalized acceptance and understanding.  Screening Tests Health Maintenance  Topic Date Due   COVID-19 Vaccine (1) Never done   TETANUS/TDAP  Never done   PAP SMEAR-Modifier  Never done   Zoster Vaccines- Shingrix (1 of 2) Never done   FOOT EXAM  06/09/2018   OPHTHALMOLOGY EXAM  07/14/2018   HEMOGLOBIN A1C  05/11/2020   INFLUENZA VACCINE  11/11/2020  DEXA SCAN  Never done   PNA vac Low Risk Adult (1 of 2 - PCV13) Never done   MAMMOGRAM  10/23/2022   COLONOSCOPY (Pts 45-12yr Insurance coverage will need to be confirmed)  03/18/2026   Hepatitis C Screening  Completed   HIV Screening  Completed   HPV VACCINES  Aged Out    Health Maintenance  Health Maintenance Due  Topic Date Due   COVID-19 Vaccine (1) Never done    TETANUS/TDAP  Never done   PAP SMEAR-Modifier  Never done   Zoster Vaccines- Shingrix (1 of 2) Never done   FOOT EXAM  06/09/2018   OPHTHALMOLOGY EXAM  07/14/2018   HEMOGLOBIN A1C  05/11/2020   INFLUENZA VACCINE  11/11/2020   DEXA SCAN  Never done   PNA vac Low Risk Adult (1 of 2 - PCV13) Never done    Colorectal cancer screening: Type of screening: Colonoscopy. Completed 03/18/2016. Repeat every 10 years  Mammogram status: Completed 10/22/2020. Repeat every year  Bone Density status: Ordered 12/25/20. Pt provided with contact info and advised to call to schedule appt.  Lung Cancer Screening: (Low Dose CT Chest recommended if Age 65-80years, 30 pack-year currently smoking OR have quit w/in 15years.) does not qualify.   Additional Screening:  Hepatitis C Screening: does qualify; Completed 12/23/2018  Vision Screening: Recommended annual ophthalmology exams for early detection of glaucoma and other disorders of the eye. Is the patient up to date with their annual eye exam?  No  Who is the provider or what is the name of the office in which the patient attends annual eye exams[place] [facility] [facility] is not established with a provider, would they like to be referred to a provider to establish care? No .   Dental Screening: Recommended annual dental exams for proper oral hygiene  Community Resource Referral / Chronic Care Management: CRR required this visit?  No   CCM required this visit?  No      Plan:     I have personally reviewed and noted the following in the patient's chart:   Medical and social history Use of alcohol, tobacco or illicit drugs  Current medications and supplements including opioid prescriptions.  Functional ability and status Nutritional status Physical activity Advanced directives List of other physicians Hospitalizations, surgeries, and ER visits in previous 12 months Vitals Screenings to include cognitive, depression, and falls Referrals and  appointments  In addition, I have reviewed and discussed with patient certain preventive protocols, quality metrics, and best practice recommendations. A written personalized care plan for preventive services as well as general preventive health recommendations were provided to patient.     ASandrea Hammond LPN   97/09/2374  Nurse Notes: CRR and CCM referral - patient severely depressed to the point that she has not been taking medications or checking her sugar - can barely afford food, losing weight, high fall risk, cannot stand long enough to cook food. Needs assistance is several areas.

## 2020-12-25 NOTE — Patient Instructions (Signed)
[name] , Thank you for taking time to come for your Medicare Wellness Visit. I appreciate your ongoing commitment to your health goals. Please review the following plan we discussed and let me know if I can assist you in the future.   Screening recommendations/referrals: Colonoscopy: Done 03/18/2016 - Repeat in 10 years Mammogram: Done 10/22/2020 - Repeat annually  Bone Density: Due.  Recommended yearly ophthalmology/optometry visit for glaucoma screening and checkup Recommended yearly dental visit for hygiene and checkup  Vaccinations: Influenza vaccine: Due every fall Pneumococcal vaccine: Due. 2 doses one year apart Tdap vaccine: Due. Every 10 years Shingles vaccine: Due. Shingrix discussed. Please contact your pharmacy for coverage information.     Covid-19: Done - we need dates please  Advanced directives: Please bring a copy of your health care power of attorney and living will to the office to be added to your chart at your convenience.   Conditions/risks identified: Always use your walker, change positions slowly, aim for 3 meals per day, 6-8 glasses of water daily and try seated exercises to help your strength.  Next appointment: Follow up in one year for your annual wellness visit    Preventive Care 65 Years and Older, Female Preventive care refers to lifestyle choices and visits with your health care provider that can promote health and wellness. What does preventive care include? A yearly physical exam. This is also called an annual well check. Dental exams once or twice a year. Routine eye exams. Ask your health care provider how often you should have your eyes checked. Personal lifestyle choices, including: Daily care of your teeth and gums. Regular physical activity. Eating a healthy diet. Avoiding tobacco and drug use. Limiting alcohol use. Practicing safe sex. Taking low-dose aspirin every day. Taking vitamin and mineral supplements as recommended by your  health care provider. What happens during an annual well check? The services and screenings done by your health care provider during your annual well check will depend on your age, overall health, lifestyle risk factors, and family history of disease. Counseling  Your health care provider may ask you questions about your: Alcohol use. Tobacco use. Drug use. Emotional well-being. Home and relationship well-being. Sexual activity. Eating habits. History of falls. Memory and ability to understand (cognition). Work and work Statistician. Reproductive health. Screening  You may have the following tests or measurements: Height, weight, and BMI. Blood pressure. Lipid and cholesterol levels. These may be checked every 5 years, or more frequently if you are over 29 years old. Skin check. Lung cancer screening. You may have this screening every year starting at age 53 if you have a 30-pack-year history of smoking and currently smoke or have quit within the past 15 years. Fecal occult blood test (FOBT) of the stool. You may have this test every year starting at age 54. Flexible sigmoidoscopy or colonoscopy. You may have a sigmoidoscopy every 5 years or a colonoscopy every 10 years starting at age 26. Hepatitis C blood test. Hepatitis B blood test. Sexually transmitted disease (STD) testing. Diabetes screening. This is done by checking your blood sugar (glucose) after you have not eaten for a while (fasting). You may have this done every 1-3 years. Bone density scan. This is done to screen for osteoporosis. You may have this done starting at age 71. Mammogram. This may be done every 1-2 years. Talk to your health care provider about how often you should have regular mammograms. Talk with your health care provider about your test results,  treatment options, and if necessary, the need for more tests. Vaccines  Your health care provider may recommend certain vaccines, such as: Influenza vaccine.  This is recommended every year. Tetanus, diphtheria, and acellular pertussis (Tdap, Td) vaccine. You may need a Td booster every 10 years. Zoster vaccine. You may need this after age 92. Pneumococcal 13-valent conjugate (PCV13) vaccine. One dose is recommended after age 37. Pneumococcal polysaccharide (PPSV23) vaccine. One dose is recommended after age 37. Talk to your health care provider about which screenings and vaccines you need and how often you need them. This information is not intended to replace advice given to you by your health care provider. Make sure you discuss any questions you have with your health care provider. Document Released: 04/26/2015 Document Revised: 12/18/2015 Document Reviewed: 01/29/2015 Elsevier Interactive Patient Education  2017 [place] Prevention in the Home Falls can cause injuries. They can happen to people of all ages. There are many things you can do to make your home safe and to help prevent falls. What can I do on the outside of my home? Regularly fix the edges of walkways and driveways and fix any cracks. Remove anything that might make you trip as you walk through a door, such as a raised step or threshold. Trim any bushes or trees on the path to your home. Use bright outdoor lighting. Clear any walking paths of anything that might make someone trip, such as rocks or tools. Regularly check to see if handrails are loose or broken. Make sure that both sides of any steps have handrails. Any raised decks and porches should have guardrails on the edges. Have any leaves, snow, or ice cleared regularly. Use sand or salt on walking paths during winter. Clean up any spills in your garage right away. This includes oil or grease spills. What can I do in the bathroom? Use night lights. Install grab bars by the toilet and in the tub and shower. Do not use towel bars as grab bars. Use non-skid mats or decals in the tub or shower. If you need to sit  down in the shower, use a plastic, non-slip stool. Keep the floor dry. Clean up any water that spills on the floor as soon as it happens. Remove soap buildup in the tub or shower regularly. Attach bath mats securely with double-sided non-slip rug tape. Do not have throw rugs and other things on the floor that can make you trip. What can I do in the bedroom? Use night lights. Make sure that you have a light by your bed that is easy to reach. Do not use any sheets or blankets that are too big for your bed. They should not hang down onto the floor. Have a firm chair that has side arms. You can use this for support while you get dressed. Do not have throw rugs and other things on the floor that can make you trip. What can I do in the kitchen? Clean up any spills right away. Avoid walking on wet floors. Keep items that you use a lot in easy-to-reach places. If you need to reach something above you, use a strong step stool that has a grab bar. Keep electrical cords out of the way. Do not use floor polish or wax that makes floors slippery. If you must use wax, use non-skid floor wax. Do not have throw rugs and other things on the floor that can make you trip. What can I do with my stairs? Do  not leave any items on the stairs. Make sure that there are handrails on both sides of the stairs and use them. Fix handrails that are broken or loose. Make sure that handrails are as long as the stairways. Check any carpeting to make sure that it is firmly attached to the stairs. Fix any carpet that is loose or worn. Avoid having throw rugs at the top or bottom of the stairs. If you do have throw rugs, attach them to the floor with carpet tape. Make sure that you have a light switch at the top of the stairs and the bottom of the stairs. If you do not have them, ask someone to add them for you. What else can I do to help prevent falls? Wear shoes that: Do not have high heels. Have rubber bottoms. Are  comfortable and fit you well. Are closed at the toe. Do not wear sandals. If you use a stepladder: Make sure that it is fully opened. Do not climb a closed stepladder. Make sure that both sides of the stepladder are locked into place. Ask someone to hold it for you, if possible. Clearly mark and make sure that you can see: Any grab bars or handrails. First and last steps. Where the edge of each step is. Use tools that help you move around (mobility aids) if they are needed. These include: Canes. Walkers. Scooters. Crutches. Turn on the lights when you go into a dark area. Replace any light bulbs as soon as they burn out. Set up your furniture so you have a clear path. Avoid moving your furniture around. If any of your floors are uneven, fix them. If there are any pets around you, be aware of where they are. Review your medicines with your doctor. Some medicines can make you feel dizzy. This can increase your chance of falling. Ask your doctor what other things that you can do to help prevent falls. This information is not intended to replace advice given to you by your health care provider. Make sure you discuss any questions you have with your health care provider. Document Released: 01/24/2009 Document Revised: 09/05/2015 Document Reviewed: 05/04/2014 Elsevier Interactive Patient Education  2017 [employer].

## 2020-12-26 ENCOUNTER — Telehealth: Payer: Self-pay

## 2020-12-26 NOTE — Chronic Care Management (AMB) (Signed)
  Chronic Care Management   Outreach Note  12/26/2020 Name: Grace Stokes MRN: [id number] DOB: 18-Dec-1955  Grace Stokes is a 65 y.o. year old female who is a primary care patient of Loman Brooklyn, FNP. I reached out to [name] by phone today in response to a referral sent by [name] [name] patient's AWV (annual wellness visit) nurse, Hopkins, Amy E, LPN .      An unsuccessful telephone outreach was attempted today. The patient was referred to the case management team for assistance with care management and care coordination.   Follow Up Plan: The care management team will reach out to the patient again over the next 5 days.  If patient returns call to provider office, please advise to call [facility]  [facility] [employer], [name], [place],  [address] Direct Dial: [phone] [email or web address] Website: [email or web address]

## 2021-01-01 DIAGNOSIS — Z029 Encounter for administrative examinations, unspecified: Secondary | ICD-10-CM

## 2021-01-02 NOTE — Chronic Care Management (AMB) (Signed)
  Chronic Care Management   Note  01/02/2021 Name: Grace Stokes MRN: [id number] DOB: 1956/03/21  Grace Stokes is a 65 y.o. year old female who is a primary care patient of Loman Brooklyn, FNP. I reached out to [name] by phone today in response to a referral sent by Grace Stokes PCP.  Grace Stokes was given information about Chronic Care Management services today including:  CCM service includes personalized support from designated clinical staff supervised by her physician, including individualized plan of care and coordination with other care providers 24/7 contact phone numbers for assistance for urgent and routine care needs. Service will only be billed when office clinical staff spend 20 minutes or more in a month to coordinate care. Only one practitioner may furnish and bill the service in a calendar month. The patient may stop CCM services at any time (effective at the end of the month) by phone call to the office staff. The patient is responsible for co-pay (up to 20% after annual deductible is met) if co-pay is required by the individual health plan.   Patient agreed to services and verbal consent obtained.   Follow up plan: Telephone appointment with care management team member scheduled for: RN CM 01/03/2021 LCSW 01/08/2021  Grace Stokes, Grace Stokes, Grace Stokes, Grace Stokes [id number] Direct Dial: [phone] [email or web address] Website: [email or web address]

## 2021-01-03 ENCOUNTER — Telehealth: Payer: Self-pay | Admitting: *Deleted

## 2021-01-03 ENCOUNTER — Telehealth: Payer: Medicare HMO | Admitting: *Deleted

## 2021-01-06 ENCOUNTER — Telehealth: Payer: Medicare HMO | Admitting: *Deleted

## 2021-01-06 ENCOUNTER — Other Ambulatory Visit: Payer: Self-pay | Admitting: Nurse Practitioner

## 2021-01-06 DIAGNOSIS — B181 Chronic viral hepatitis B without delta-agent: Secondary | ICD-10-CM

## 2021-01-06 NOTE — Telephone Encounter (Signed)
  Care Management   Follow Up Note   01/06/2021 Name: Grace Stokes MRN: [id number] DOB: 13-May-1955   Referred by: Loman Brooklyn, FNP Reason for referral : No chief complaint on file.   An unsuccessful telephone outreach was attempted today. The patient was referred to the case management team for assistance with care management and care coordination.   I attempted to reach patient on 9/23 and again today.   Follow Up Plan: A HIPPA compliant phone message was left for the patient providing contact information and requesting a return call. Forwarding to [facility] Care Guides for outreach and rescheduling.   Chong Sicilian, BSN, RN-BC Embedded Chronic Care Manager [facility] / [place] [facility] Direct Dial: [phone]

## 2021-01-08 ENCOUNTER — Ambulatory Visit (INDEPENDENT_AMBULATORY_CARE_PROVIDER_SITE_OTHER): Payer: Medicare HMO | Admitting: Licensed Clinical Social Worker

## 2021-01-08 DIAGNOSIS — Z5941 Food insecurity: Secondary | ICD-10-CM

## 2021-01-08 DIAGNOSIS — IMO0002 Reserved for concepts with insufficient information to code with codable children: Secondary | ICD-10-CM

## 2021-01-08 DIAGNOSIS — K7469 Other cirrhosis of liver: Secondary | ICD-10-CM

## 2021-01-08 DIAGNOSIS — E782 Mixed hyperlipidemia: Secondary | ICD-10-CM

## 2021-01-08 DIAGNOSIS — D61818 Other pancytopenia: Secondary | ICD-10-CM

## 2021-01-08 DIAGNOSIS — I10 Essential (primary) hypertension: Secondary | ICD-10-CM

## 2021-01-08 DIAGNOSIS — G4701 Insomnia due to medical condition: Secondary | ICD-10-CM

## 2021-01-08 DIAGNOSIS — E1165 Type 2 diabetes mellitus with hyperglycemia: Secondary | ICD-10-CM

## 2021-01-08 NOTE — Patient Instructions (Signed)
Visit Information  PATIENT GOALS:  Goals Addressed             This Visit's Progress    Manage My Emotions; Manage depression issues ; manage stress issues       Timeframe:  Short-Term Goal Priority:  Medium Progress: On Track Start Date:             01/08/21                Expected End Date:           04/01/21            Follow Up Date 02/24/21 at 3:00 PM   Manage emotions; manage depression issues; manage stress issues    Why is this important?   When you are stressed, down or upset, your body reacts too.  For example, your blood pressure may get higher; you may have a headache or stomachache.  When your emotions get the best of you, your body's ability to fight off cold and flu gets weak.  These steps will help you manage your emotions.     Patient Self Care Activities:  Self administers medications as prescribed Attends all scheduled provider appointments Performs ADL's independently Performs IADL's independently  Patient Coping Strengths:  Family Friends  Patient Self Care Deficits:  Mobility issues Pain issues Food procurement challenges Transport challenges  Patient Goals:  - spend time or talk with others at least 2 to 3 times per week - practice relaxation or meditation daily - keep a calendar with appointment dates  Follow Up Plan:  LCSW to call client on 02/24/21 at 3:00 PM to assess client needs    Norva Riffle.Alayzha An MSW, LCSW Licensed Clinical Social Worker [facility] Care Management [phone]

## 2021-01-08 NOTE — Chronic Care Management (AMB) (Signed)
Chronic Care Management    Clinical Social Work Note  01/08/2021 Name: Grace Stokes MRN: [id number] DOB: 1955/10/01  Grace Stokes is a 65 y.o. year old female who is a primary care patient of Loman Brooklyn, FNP. The CCM team was consulted to assist the patient with chronic disease management and/or care coordination needs related to: [employer] .   Engaged with patient by telephone for initial visit in response to provider referral for social work chronic care management and care coordination services.   Consent to Services:  The patient was given the following information about Chronic Care Management services today, agreed to services, and gave verbal consent: 1. CCM service includes personalized support from designated clinical staff supervised by the primary care provider, including individualized plan of care and coordination with other care providers 2. 24/7 contact phone numbers for assistance for urgent and routine care needs. 3. Service will only be billed when office clinical staff spend 20 minutes or more in a month to coordinate care. 4. Only one practitioner may furnish and bill the service in a calendar month. 5.The patient may stop CCM services at any time (effective at the end of the month) by phone call to the office staff. 6. The patient will be responsible for cost sharing (co-pay) of up to 20% of the service fee (after annual deductible is met). Patient agreed to services and consent obtained.  Patient agreed to services and consent obtained.   Assessment: Review of patient past medical history, allergies, medications, and health status, including review of relevant consultants reports was performed today as part of a comprehensive evaluation and provision of chronic care management and care coordination services.     SDOH (Social Determinants of Health) assessments and interventions performed:  SDOH Interventions    Flowsheet Row Most Recent Value   SDOH Interventions   Food Insecurity Interventions Other (Comment)  [food insecurity,  has difficulty standing to cook]  Physical Activity Interventions Other (Comments)  [walking challenges]  Stress Interventions Provide Counseling  [client has stress related to financial issues,  some difficulty paying hospital bills]  Depression Interventions/Treatment  Patient refuses Treatment        Advanced Directives Status: See Vynca application for related entries.  CCM Care Plan  Allergies  Allergen Reactions   Gabapentin     Uncontrolled shakes / cold intolerance     Outpatient Encounter Medications as of 01/08/2021  Medication Sig   acetaminophen (TYLENOL) 325 MG tablet Take 2 tablets (650 mg total) by mouth every 6 (six) hours as needed for mild pain (or Fever >/= 101).   amLODipine (NORVASC) 10 MG tablet Take 1 tablet (10 mg total) by mouth daily. for blood pressure (NEEDS TO BE SEEN BEFORE NEXT REFILL)   blood glucose meter kit and supplies KIT Dispense based on patient and insurance preference. Use up to four times daily as directed.   cyclobenzaprine (FLEXERIL) 10 MG tablet Take 1 tablet (10 mg total) by mouth 2 (two) times daily as needed for muscle spasms.   insulin NPH Human (NOVOLIN N RELION) 100 UNIT/ML injection Inject 0.4 mLs (40 Units total) into the skin daily before breakfast AND 0.55 mLs (55 Units total) daily with supper.   Insulin Syringe-Needle U-100 (SAFETY-GLIDE 0.3CC SYR 29GX1/2) 29G X 1/2" 0.3 ML MISC Use as directed, 2 times daily with meals.   levETIRAcetam (KEPPRA) 500 MG tablet Take 1 tablet (500 mg total) by mouth 2 (two) times daily for 6 days.  lisinopril (ZESTRIL) 20 MG tablet Take 1 tablet (20 mg total) by mouth daily. (NEEDS TO BE SEEN BEFORE NEXT REFILL)   metoprolol succinate (TOPROL-XL) 100 MG 24 hr tablet Take 1 tablet (100 mg total) by mouth daily. (NEEDS TO BE SEEN BEFORE NEXT REFILL)   pantoprazole (PROTONIX) 40 MG tablet Take 1 tablet (40 mg total)  by mouth daily. For stomach   traZODone (DESYREL) 100 MG tablet Take 1 tablet (100 mg total) by mouth at bedtime.   TRUEPLUS LANCETS 30G MISC Check blood sugar up to 4 times a day   No facility-administered encounter medications on file as of 01/08/2021.    Patient Active Problem List   Diagnosis Date Noted   Other cirrhosis of liver ([place]) 10/23/2020   Polysubstance abuse ([place]) 10/23/2020   Mixed hyperlipidemia 04/18/2019   Other pancytopenia ([place]) 03/02/2019   Hepatitis B 12/25/2018   Thrombocytopenia ([place])    Transaminitis    Hyperbilirubinemia    Insomnia due to medical condition 09/22/2018   Chronic active viral hepatitis B ([place]) 01/26/2017   Uncontrolled type 2 diabetes mellitus with complication, with long-term current use of insulin ([place]) 01/13/2017   Essential hypertension 12/17/2016   Heart murmur, systolic 40/01/2724   Heroin abuse ([place]) 12/01/2016   Smoker 08/26/2016   Idiopathic scoliosis 01/28/2016   Opiate addiction ([place]) 01/28/2016   Cervical myelopathy ([place]) 07/08/2013   Cervical spondylosis with myelopathy 07/08/2013    Conditions to be addressed/monitored: monitor client management of depression issues; monitor client management of stress issues faced  Care Plan : [place]  Updates made by Katha Cabal, LCSW since 01/08/2021 12:00 AM     Problem: Emotional Distress      Goal: Emotional Health Supported;manage stress issues; manage depression issues   Start Date: 01/08/2021  Expected End Date: 04/01/2021  This Visit's Progress: On track  Priority: High  Note:   Current Barriers:  Chronic Mental Health needs related to depression and stress issues Mobility issues Pain issues Suicidal Ideation/Homicidal Ideation: No  Clinical Social Work Goal(s):  patient will work with SW monthly by telephone or in person to reduce or manage symptoms related to depression and stress issues LCSW will communicate with client in next 30 days to discuss  anxiety and stress issues faced by client LCSW will talk with client in next 30 days to discuss financial challenges of client Client to call RNCM as needed in next 30 days to discuss nursing needs of client  Interventions: Patient interviewed and appropriate assessments performed: PHQ-9 and GAD-7 1:1 collaboration with Loman Brooklyn, FNP regarding development and update of comprehensive plan of care as evidenced by provider attestation and co-signature Talked with Freja about CCM program support Talked with Katricia about walking challenges of client Talked with Deleah about standing challenges of client Talked with Jazmaine about health of her spouse, [name] Talked with Delayza about food procurement for client Talked with Norine about financial demands. She said it is hard financially to pay all of their bills. She said they make monthly payments on medical bills but it is hard to pay for all of the couple's medical costs Provided counseling support for client Talked with Delorus about walking challenges. She spoke of scoliosis and fact that she uses a cane or a walker to help her ambulate Talked with Lanissa about pain issues faced Client talked with LCSW about her history of medical surgeries. She spoke of affects of [place] [place].   Spoke with Kalicia about transport needs.  She said that at present she nor her spouse can drive. Talked with Mariann Laster about medication procurement. She said she tries to take medications as prescribed Encouraged Keiona to call LCSW at [phone] as needed for social work support with CCM program   Patient Self Care Activities:  Self administers medications as prescribed Attends all scheduled provider appointments Performs ADL's independently Performs IADL's independently  Patient Coping Strengths:  Family Friends  Patient Self Care Deficits:  Mobility issues Pain issues Academic librarian  Patient Goals:  - spend time or talk with  others at least 2 to 3 times per week - practice relaxation or meditation daily - keep a calendar with appointment dates  Follow Up Plan:  LCSW to call client on 02/24/21 at 3:00 PM to assess client needs    Norva Riffle.Kimm Ungaro MSW, LCSW Licensed Clinical Social Worker [facility] Care Management [phone]

## 2021-01-09 ENCOUNTER — Telehealth: Payer: Self-pay | Admitting: *Deleted

## 2021-01-09 ENCOUNTER — Telehealth: Payer: Self-pay

## 2021-01-09 NOTE — Telephone Encounter (Signed)
  Care Management   Follow Up Note   01/06/2021 Name: Grace Stokes MRN: [id number] DOB: [birth year]   Referred by: Loman Brooklyn, FNP Reason for referral : No chief complaint on file.   An unsuccessful telephone outreach was attempted today. The patient was referred to the case management team for assistance with care management and care coordination.   Follow Up Plan: A HIPPA compliant phone message was left for the patient providing contact information and requesting a return call. Forwarding to [facility] Care Guide for outreach and rescheduling.   Chong Sicilian, BSN, RN-BC Embedded Chronic Care Manager [facility] / [place] [facility] Direct Dial: [phone]

## 2021-01-09 NOTE — Telephone Encounter (Signed)
   Telephone encounter was:  Unsuccessful.  01/09/2021 Name: Grace Stokes MRN: [id number] DOB: 1956/04/01  Unsuccessful outbound call made today to assist with:   food pantries and med alert.  Outreach Attempt:  1st Attempt  A HIPAA compliant voice message was left requesting a return call.  Instructed patient to call back at [phone].  [name], AAS Paralegal, [place] [facility]  [address], [place] [address] [email or web address]  ?? [phone]   [email or web address]

## 2021-01-10 DIAGNOSIS — E782 Mixed hyperlipidemia: Secondary | ICD-10-CM

## 2021-01-10 DIAGNOSIS — I10 Essential (primary) hypertension: Secondary | ICD-10-CM

## 2021-01-10 DIAGNOSIS — E1165 Type 2 diabetes mellitus with hyperglycemia: Secondary | ICD-10-CM

## 2021-01-10 DIAGNOSIS — Z794 Long term (current) use of insulin: Secondary | ICD-10-CM | POA: Diagnosis not present

## 2021-01-10 DIAGNOSIS — E118 Type 2 diabetes mellitus with unspecified complications: Secondary | ICD-10-CM | POA: Diagnosis not present

## 2021-01-10 DIAGNOSIS — D61818 Other pancytopenia: Secondary | ICD-10-CM

## 2021-01-13 ENCOUNTER — Telehealth: Payer: Self-pay

## 2021-01-13 NOTE — Telephone Encounter (Signed)
   Telephone encounter was:  Unsuccessful.  01/13/2021 Name: Grace Stokes MRN: [id number] DOB: [birth year]  Unsuccessful outbound call made today to assist with:   food pantries and medical alert system.  Outreach Attempt:  2nd Attempt  A HIPAA compliant voice message was left requesting a return call.  Instructed patient to call back at [phone].  Arnella Pralle, AAS Paralegal, [place] [facility]  [address], [place] [address] ??[email or web address]  ?? [phone]   [email or web address]

## 2021-01-14 ENCOUNTER — Ambulatory Visit: Payer: Medicare HMO | Admitting: Pharmacist

## 2021-01-15 ENCOUNTER — Telehealth: Payer: Self-pay

## 2021-01-15 NOTE — Chronic Care Management (AMB) (Signed)
  Care Management   Note  01/15/2021 Name: Grace Stokes MRN: [id number] DOB: 1955/07/06  [name] is a 65 y.o. year old female who is a primary care patient of Loman Brooklyn, FNP and is actively engaged with the care management team. I reached out to [name] by phone today to assist with re-scheduling an initial visit with the RN Case Manager  Follow up plan: Unsuccessful telephone outreach attempt made. A HIPAA compliant phone message was left for the patient providing contact information and requesting a return call.  The care management team will reach out to the patient again over the next 7 days.  If patient returns call to provider office, please advise to call [facility] at [place], [place], [place], [place] [address] Direct Dial: [phone] [email or web address]

## 2021-01-16 ENCOUNTER — Other Ambulatory Visit: Payer: Self-pay

## 2021-01-16 ENCOUNTER — Telehealth: Payer: Self-pay

## 2021-01-16 ENCOUNTER — Ambulatory Visit (INDEPENDENT_AMBULATORY_CARE_PROVIDER_SITE_OTHER): Payer: Medicare HMO | Admitting: Family Medicine

## 2021-01-16 ENCOUNTER — Encounter: Payer: Self-pay | Admitting: Family Medicine

## 2021-01-16 VITALS — BP 160/86 | HR 111 | Temp 97.6°F | Ht 61.0 in | Wt 130.4 lb

## 2021-01-16 DIAGNOSIS — E1122 Type 2 diabetes mellitus with diabetic chronic kidney disease: Secondary | ICD-10-CM | POA: Diagnosis not present

## 2021-01-16 DIAGNOSIS — E782 Mixed hyperlipidemia: Secondary | ICD-10-CM

## 2021-01-16 DIAGNOSIS — N183 Chronic kidney disease, stage 3 unspecified: Secondary | ICD-10-CM | POA: Diagnosis not present

## 2021-01-16 DIAGNOSIS — K7469 Other cirrhosis of liver: Secondary | ICD-10-CM

## 2021-01-16 DIAGNOSIS — E1165 Type 2 diabetes mellitus with hyperglycemia: Secondary | ICD-10-CM

## 2021-01-16 DIAGNOSIS — B181 Chronic viral hepatitis B without delta-agent: Secondary | ICD-10-CM | POA: Diagnosis not present

## 2021-01-16 DIAGNOSIS — Z5309 Procedure and treatment not carried out because of other contraindication: Secondary | ICD-10-CM | POA: Insufficient documentation

## 2021-01-16 DIAGNOSIS — D696 Thrombocytopenia, unspecified: Secondary | ICD-10-CM | POA: Diagnosis not present

## 2021-01-16 DIAGNOSIS — Z23 Encounter for immunization: Secondary | ICD-10-CM

## 2021-01-16 DIAGNOSIS — D61818 Other pancytopenia: Secondary | ICD-10-CM

## 2021-01-16 DIAGNOSIS — I10 Essential (primary) hypertension: Secondary | ICD-10-CM | POA: Diagnosis not present

## 2021-01-16 LAB — BAYER DCA HB A1C WAIVED: HB A1C (BAYER DCA - WAIVED): 13 % — ABNORMAL HIGH (ref 4.8–5.6)

## 2021-01-16 MED ORDER — TRULICITY 0.75 MG/0.5ML ~~LOC~~ SOAJ
0.7500 mg | SUBCUTANEOUS | 0 refills | Status: DC
Start: 2021-01-16 — End: 2021-02-18

## 2021-01-16 MED ORDER — BLOOD GLUCOSE MONITOR KIT: PACK | 0 refills | Status: DC

## 2021-01-16 MED ORDER — METOPROLOL SUCCINATE ER 100 MG PO TB24: 100.0000 mg | ORAL_TABLET | Freq: Every day | ORAL | 1 refills | Status: DC

## 2021-01-16 NOTE — Telephone Encounter (Signed)
   Telephone encounter was:  Successful.  01/16/2021 Name: Treyana Sturgell MRN: [id number] DOB: February 16, 1956  Augusta Mirkin is a 65 y.o. year old female who is a primary care patient of Loman Brooklyn, FNP . The community resource team was consulted for assistance with Food Insecurity and med alert  Care guide performed the following interventions: Spoke with patient she prefers that I mail the resources for food pantries and medalert.   IT consultant and [employer] application to [place] bldg to be printed and mailed to patient.  Patient has my name and contact number to call when she receives letter.  I will check with patient next week and update chart notes.  Follow Up Plan:  No further follow up planned at this time. The patient has been provided with needed resources.  Demetria Lightsey, AAS Paralegal, [facility]  [address], [place] [address] [email or web address]  ?? [phone]   [email or web address]

## 2021-01-16 NOTE — Progress Notes (Signed)
Assessment & Plan:  1. Essential hypertension - uncontrolled and has been off her 3 previously prescribed medications (amlodipine, lisinopril, and metoprolol) - will restart with just metoprolol 100 mg QD and reassess at next visit. - Lipid panel - CBC with Differential/Platelet - CMP14+EGFR - metoprolol succinate (TOPROL-XL) 100 MG 24 hr tablet; Take 1 tablet (100 mg total) by mouth daily.  Dispense: 90 tablet; Refill: 1  2. Mixed hyperlipidemia - previously well controlled without medication - Lipid panel  3. Uncontrolled type 2 diabetes mellitus with hyperglycemia (HCC) - uncontrolled, currently using no medications, will initiate trulicity - 2 sample boxes provided (LOT [id number]; EXP 2023-AUG-24) - encouraged a more balanced diet, difficult to obtain due to food insecurity and inability to cook - education provided on nutrition in diabetes - Lipid panel - CBC with Differential/Platelet - CMP14+EGFR - Bayer DCA Hb A1c Waived, 13 today - Dulaglutide (TRULICITY) 4.69 GE/9.5MW SOPN; Inject 0.75 mg into the skin once a week.  Dispense: 2 mL; Refill: 0 - blood glucose meter kit and supplies KIT; Dispense based on patient and insurance preference. Use up to four times daily as directed.  Dispense: 1 each; Refill: 0 - AMB Referral to [facility], appreciate assistance obtaining Trulicity - Microalbumin / creatinine urine ratio  4. Chronic active viral hepatitis B ([place] [facility]) - followed by liver specialist  5. Other cirrhosis of liver ([facility]) - followed by liver specialist  6. Thrombocytopenia (HCC) - CBC with Differential/Platelet  7. Other pancytopenia ([facility]) - CBC with Differential/Platelet  8. Statins contraindicated - encouraged low fat diet   Follow up plan: Return in about 4 weeks (around 02/13/2021) for DM, HTN.  Lucile Crater, NP Student  I personally was present during the history, physical exam, and medical decision-making activities of this service and  have verified that the service and findings are accurately documented in the nurse practitioner student's note.  Hendricks Limes, MSN, APRN, FNP-C [facility]   Subjective:   Patient ID: Grace Stokes, female    DOB: 09-Mar-1956, 65 y.o.   MRN: [id number]  HPI: Grace Stokes is a 65 y.o. female presenting on 01/16/2021 for Establish Care ([facility] patient ) and Diabetes (Check up of chronic medical conditions )  She was previously managed by Dr. Livia Snellen but was last seen over a year ago for chronic medical conditions but has run out of many medications. She states she has not been taking any of her medications due to her depression and not caring if she lived or died.   She was referred to care management for food insecurity and inability to cook food for herself and her husband due to difficulties with mobility. Care management facilitated some food to be delivered from a local food pantry. She reports that her daughter in law recently brought them groceries as well.   Hypertension: She was previously on Metoprolol, amlodipine, and lisinopril for her blood pressure, but has not been taking any of them. She does not check her blood pressure at home. She denies foot/ankle swelling, chest pain, palpitations, and heart rhythm irregularities.  Tachycardia: She reports a history of tachycardia. This was helped by metoprolol, which she is not taking.   Diabetes:  Her A1C was 12.9 in July 2021. She did not return for a follow up. She was previously prescribed NPH 40 units at breakfast and 55 units with dinner. She was poorly controlled on this regimen over a year ago, with blood sugars ranging from 200s-400s. She was  not on oral hyperglycemic medications.   Current symptoms include: hyperglycemia. Known diabetic complications: nephropathy and cardiovascular disease. Medication compliance: not taking any medications for diabetes. Current diet:  limited range of foods due  to food insecurity, tendency towards high carb . Current exercise: none. Home blood sugar records:  high all the time, ranging 200s-400s . Is she  on ACE inhibitor or angiotensin II receptor blocker? No, was prescribed lisinopril but has not been taking. Is she on a statin? Not Indicated, contraindicated due to active liver disease.   She does not have a podiatrist. Her last eye exam was at a [facility] in [place] last year. She states she will schedule an eye exam for this year at [facility] in [place].  Hyperlipidemia: Her triglycerides were mildly elevated April 2021, panel otherwise normal. She was not on a statin.   Chronic Hepatitis/Cirrhosis: She is followed at [facility] for her hepatitis B and cirrhosis. She was last seen in July 2022 and they ordered a liver ultrasound to rule out liver cancer and referral to GI for EGD/ colonoscopy to evaluate for varices and GI bleed.  Depression: She reports difficulty managing her and her husband's chronic medical problems. They are having trouble sleeping well. She feels her symptoms will improve as her husband's health conditions improve. She does not want a medication for this.  Depression screen [facility] 2/9 01/16/2021 01/08/2021 12/25/2020  Decreased Interest '1 1 2  ' Down, Depressed, Hopeless '1 2 2  ' PHQ - 2 Score '2 3 4  ' Altered sleeping '2 1 3  ' Tired, decreased energy '2 1 1  ' Change in appetite '1 1 1  ' Feeling bad or failure about yourself  '1 1 3  ' Trouble concentrating 0 1 2  Moving slowly or fidgety/restless 0 2 1  Suicidal thoughts 0 0 0  PHQ-9 Score '8 10 15  ' Difficult doing work/chores Somewhat difficult Somewhat difficult Somewhat difficult  Some recent data might be hidden   GAD 7 : Generalized Anxiety Score 01/16/2021 01/08/2021  Nervous, Anxious, on Edge 2 1  Control/stop worrying 1 1  Worry too much - different things 2 1  Trouble relaxing 1 1  Restless 0 1  Easily annoyed or irritable 1 1  Afraid -  awful might happen 1 1  Total GAD 7 Score 8 7  Anxiety Difficulty Somewhat difficult Somewhat difficult    Health maintenance:  She is up to date on her COVID vaccines. She is getting her influenza vaccine today.   ROS: Negative unless specifically indicated above in HPI.   Relevant past medical history reviewed and updated as indicated.   Allergies and medications reviewed and updated.   Current Outpatient Medications:    blood glucose meter kit and supplies KIT, Dispense based on patient and insurance preference. Use up to four times daily as directed., Disp: 1 each, Rfl: 0   Dulaglutide (TRULICITY) 3.88 IL/5.7VJ SOPN, Inject 0.75 mg into the skin once a week., Disp: 2 mL, Rfl: 0   metoprolol succinate (TOPROL-XL) 100 MG 24 hr tablet, Take 1 tablet (100 mg total) by mouth daily., Disp: 90 tablet, Rfl: 1  Allergies  Allergen Reactions   Gabapentin     Uncontrolled shakes / cold intolerance     Objective:   BP (!) 160/86   Pulse (!) 111   Temp 97.6 F (36.4 C) (Temporal)   Ht '5\' 1"'  (1.549 m)   Wt 59.1 kg   LMP 04/06/2011   SpO2 93%  BMI 24.64 kg/m    Physical Exam Vitals reviewed.  Constitutional:      General: She is not in acute distress.    Appearance: Normal appearance. She is normal weight. She is not ill-appearing, toxic-appearing or diaphoretic.  HENT:     Head: Normocephalic and atraumatic.     Right Ear: Tympanic membrane, ear canal and external ear normal.     Left Ear: Tympanic membrane, ear canal and external ear normal.     Nose: Nose normal. No congestion.     Mouth/Throat:     Mouth: Mucous membranes are moist.     Pharynx: Oropharynx is clear. No oropharyngeal exudate or posterior oropharyngeal erythema.  Eyes:     Comments: Right eye closed  Cardiovascular:     Rate and Rhythm: Normal rate and regular rhythm.     Pulses: Normal pulses.     Heart sounds: Normal heart sounds.  Pulmonary:     Effort: Pulmonary effort is normal. No respiratory  distress.     Breath sounds: Normal breath sounds. No wheezing or rhonchi.  Abdominal:     General: Bowel sounds are normal. There is no distension.     Palpations: Abdomen is soft. There is no mass.  Musculoskeletal:     Cervical back: Normal range of motion.     Thoracic back: Deformity and tenderness present. Decreased range of motion. Scoliosis present.     Lumbar back: Deformity and tenderness present. Decreased range of motion. Scoliosis present.  Skin:    General: Skin is warm and dry.  Neurological:     General: No focal deficit present.     Mental Status: She is alert and oriented to person, place, and time.     Motor: No weakness.     Gait: Gait abnormal (hunched over and uses a walker).  Psychiatric:        Mood and Affect: Mood normal.        Behavior: Behavior normal.        Thought Content: Thought content normal.        Judgment: Judgment normal.

## 2021-01-17 LAB — CBC WITH DIFFERENTIAL/PLATELET
Basophils Absolute: 0 10*3/uL (ref 0.0–0.2)
Basos: 0 %
EOS (ABSOLUTE): 0 10*3/uL (ref 0.0–0.4)
Eos: 1 %
Hematocrit: 38 % (ref 34.0–46.6)
Hemoglobin: 12.7 g/dL (ref 11.1–15.9)
Immature Grans (Abs): 0 10*3/uL (ref 0.0–0.1)
Immature Granulocytes: 0 %
Lymphocytes Absolute: 0.7 10*3/uL (ref 0.7–3.1)
Lymphs: 26 %
MCH: 29.9 pg (ref 26.6–33.0)
MCHC: 33.4 g/dL (ref 31.5–35.7)
MCV: 89 fL (ref 79–97)
Monocytes Absolute: 0.2 10*3/uL (ref 0.1–0.9)
Monocytes: 9 %
Neutrophils Absolute: 1.7 10*3/uL (ref 1.4–7.0)
Neutrophils: 64 %
Platelets: 57 10*3/uL — CL (ref 150–450)
RBC: 4.25 x10E6/uL (ref 3.77–5.28)
RDW: 13 % (ref 11.7–15.4)
WBC: 2.7 10*3/uL — ABNORMAL LOW (ref 3.4–10.8)

## 2021-01-17 LAB — CMP14+EGFR
ALT: 21 IU/L (ref 0–32)
AST: 31 IU/L (ref 0–40)
Albumin/Globulin Ratio: 0.8 — ABNORMAL LOW (ref 1.2–2.2)
Albumin: 3.6 g/dL — ABNORMAL LOW (ref 3.8–4.8)
Alkaline Phosphatase: 198 IU/L — ABNORMAL HIGH (ref 44–121)
BUN/Creatinine Ratio: 9 — ABNORMAL LOW (ref 12–28)
BUN: 6 mg/dL — ABNORMAL LOW (ref 8–27)
Bilirubin Total: 1 mg/dL (ref 0.0–1.2)
CO2: 24 mmol/L (ref 20–29)
Calcium: 9.1 mg/dL (ref 8.7–10.3)
Chloride: 95 mmol/L — ABNORMAL LOW (ref 96–106)
Creatinine, Ser: 0.66 mg/dL (ref 0.57–1.00)
Globulin, Total: 4.5 g/dL (ref 1.5–4.5)
Glucose: 521 mg/dL (ref 70–99)
Potassium: 4.1 mmol/L (ref 3.5–5.2)
Sodium: 133 mmol/L — ABNORMAL LOW (ref 134–144)
Total Protein: 8.1 g/dL (ref 6.0–8.5)
eGFR: 97 mL/min/{1.73_m2} (ref >59)

## 2021-01-17 LAB — MICROALBUMIN / CREATININE URINE RATIO
Creatinine, Urine: 21.9 mg/dL
Microalb/Creat Ratio: 21 mg/g creat (ref 0–29)
Microalbumin, Urine: 4.6 ug/mL

## 2021-01-17 LAB — LIPID PANEL
Chol/HDL Ratio: 4.3 ratio (ref 0.0–4.4)
Cholesterol, Total: 194 mg/dL (ref 100–199)
HDL: 45 mg/dL (ref >39)
LDL Chol Calc (NIH): 114 mg/dL — ABNORMAL HIGH (ref 0–99)
Triglycerides: 199 mg/dL — ABNORMAL HIGH (ref 0–149)
VLDL Cholesterol Cal: 35 mg/dL (ref 5–40)

## 2021-01-20 ENCOUNTER — Other Ambulatory Visit: Payer: Medicare HMO

## 2021-01-22 NOTE — Chronic Care Management (AMB) (Signed)
  Care Management   Note  01/22/2021 Name: [name] MRN: [id number] DOB: August 07, 1955  Grace Stokes is a 65 y.o. year old female who is a primary care patient of Loman Brooklyn, FNP and is actively engaged with the care management team. I reached out to [name] by phone today to assist with scheduling a follow up visit with the RN Case Manager and Pharmacist  Follow up plan: Unsuccessful telephone outreach attempt made. A HIPAA compliant phone message was left for the patient providing contact information and requesting a return call.  The care management team will reach out to the patient again over the next 7 days.  If patient returns call to provider office, please advise to call [facility] at [place], [place], [place], [place] [address] Direct Dial: [phone] [email or web address]: [email or web address]

## 2021-01-24 ENCOUNTER — Ambulatory Visit
Admission: RE | Admit: 2021-01-24 | Discharge: 2021-01-24 | Disposition: A | Discharge status: Home / Self Care | Payer: Medicare HMO | Source: Ambulatory Visit | Attending: Nurse Practitioner | Admitting: Nurse Practitioner

## 2021-01-24 DIAGNOSIS — B1910 Unspecified viral hepatitis B without hepatic coma: Secondary | ICD-10-CM | POA: Diagnosis not present

## 2021-01-24 DIAGNOSIS — B181 Chronic viral hepatitis B without delta-agent: Secondary | ICD-10-CM

## 2021-01-30 ENCOUNTER — Telehealth: Payer: Self-pay | Admitting: Pharmacist

## 2021-01-30 ENCOUNTER — Ambulatory Visit: Payer: Medicare HMO | Admitting: Pharmacist

## 2021-01-30 NOTE — Telephone Encounter (Signed)
  Care Management   Follow Up Note   01/30/2021 Name: Grace Stokes MRN: [id number] DOB: Dec 05, 1955   Referred by: Loman Brooklyn, FNP Reason for referral : Chronic Care Management (UNSUCCESSFUL OUTREACH)  DIABETES   A second unsuccessful telephone outreach was attempted today. The patient was referred to the case management team for assistance with care management and care coordination.   Follow Up Plan: The care management team will reach out to the patient again over the next 10 days.   SIGNATURE Regina Eck, PharmD, BCPS Clinical Pharmacist, [facility]  II Phone [phone]

## 2021-02-03 ENCOUNTER — Telehealth: Payer: Self-pay | Admitting: Family Medicine

## 2021-02-04 NOTE — Telephone Encounter (Signed)
Patient was confused about appt times May be some baseline memory issues? I'm not sure  She has no-showed twice We can try one last time to schedule with me  CCM (in person or tele) whichever patient prefers  Thanks!

## 2021-02-14 ENCOUNTER — Telehealth: Payer: Self-pay | Admitting: Pharmacist

## 2021-02-14 NOTE — Chronic Care Management (AMB) (Signed)
  Care Management   Note  02/14/2021 Name: Grace Stokes MRN: [id number] DOB: July 10, 1955  Grace Stokes is a 65 y.o. year old female who is a primary care patient of Loman Brooklyn, FNP and is actively engaged with the care management team. I reached out to [name] by phone today to assist with re-scheduling an initial visit with the RN Case Manager and Pharmacist  Follow up plan: Telephone appointment with care management team member scheduled for: RN CM 02/17/2021 Pharm D 03/21/2021  Noreene Larsson, [place], [place], [place] [address] Direct Dial: [phone] [email or web address]

## 2021-02-14 NOTE — Telephone Encounter (Signed)
Patient has "no-showed" twice We have her rescheduled We do not have samples of trulicity Do you want me to substitute with anything? I don't want to confuse her--we have ozempic, onglyza (like [place]) that would be good until patient sees me Let me know!

## 2021-02-14 NOTE — Telephone Encounter (Signed)
Grace Stokes,  I have scheduled Patient in first available, but she advised me that she will need more Samples of Trulicity. Pt states that she took her last one last Sunday.  Thank you  Noreene Larsson, [place], [place], [place] [address] Direct Dial: [phone] [email or web address] Website: [email or web address]

## 2021-02-14 NOTE — Telephone Encounter (Signed)
I can send an oral medication that her insurance will cover until she comes to see you if you think that would be better. I don't want her to keep no-showing you and getting samples. What do you think?

## 2021-02-17 ENCOUNTER — Ambulatory Visit (INDEPENDENT_AMBULATORY_CARE_PROVIDER_SITE_OTHER): Payer: Medicare HMO | Admitting: *Deleted

## 2021-02-17 DIAGNOSIS — I10 Essential (primary) hypertension: Secondary | ICD-10-CM

## 2021-02-17 DIAGNOSIS — K7469 Other cirrhosis of liver: Secondary | ICD-10-CM

## 2021-02-17 DIAGNOSIS — E1165 Type 2 diabetes mellitus with hyperglycemia: Secondary | ICD-10-CM

## 2021-02-18 ENCOUNTER — Encounter: Payer: Self-pay | Admitting: Family Medicine

## 2021-02-18 ENCOUNTER — Other Ambulatory Visit: Payer: Self-pay

## 2021-02-18 ENCOUNTER — Ambulatory Visit (INDEPENDENT_AMBULATORY_CARE_PROVIDER_SITE_OTHER): Payer: Medicare HMO | Admitting: Family Medicine

## 2021-02-18 VITALS — BP 142/83 | HR 70 | Temp 97.7°F | Ht 61.0 in | Wt 125.4 lb

## 2021-02-18 DIAGNOSIS — I700 Atherosclerosis of aorta: Secondary | ICD-10-CM | POA: Diagnosis not present

## 2021-02-18 DIAGNOSIS — E1165 Type 2 diabetes mellitus with hyperglycemia: Secondary | ICD-10-CM

## 2021-02-18 DIAGNOSIS — I10 Essential (primary) hypertension: Secondary | ICD-10-CM

## 2021-02-18 DIAGNOSIS — R000 Tachycardia, unspecified: Secondary | ICD-10-CM | POA: Insufficient documentation

## 2021-02-18 DIAGNOSIS — F331 Major depressive disorder, recurrent, moderate: Secondary | ICD-10-CM | POA: Insufficient documentation

## 2021-02-18 MED ORDER — TRULICITY 1.5 MG/0.5ML ~~LOC~~ SOAJ: 1.5000 mg | SUBCUTANEOUS | 2 refills | Status: DC

## 2021-02-18 MED ORDER — ASPIRIN 81 MG PO TBEC: 81.0000 mg | DELAYED_RELEASE_TABLET | Freq: Every day | ORAL | 12 refills | Status: DC

## 2021-02-18 MED ORDER — LISINOPRIL 10 MG PO TABS
10.0000 mg | ORAL_TABLET | Freq: Every day | ORAL | 2 refills | Status: DC
Start: 2021-02-18 — End: 2021-05-08

## 2021-02-18 NOTE — Progress Notes (Signed)
Assessment & Plan:  1. Uncontrolled type 2 diabetes mellitus with hyperglycemia ([place]) - tolerating Trulicity well, will increase to 1.5 mg weekly - Dulaglutide (TRULICITY) 1.5 TD/4.2AJ SOPN; Inject 1.5 mg into the skin once a week.  Dispense: 2 mL; Refill: 2 Lab Results  Component Value Date   HGBA1C 13.0 (H) 01/16/2021   HGBA1C 12.9 (H) 11/09/2019   HGBA1C 11.3 (H) 07/26/2019    - Diabetes is not at goal of A1c < 7. - Medications:  increase Trulicity to 1.5 mg weekly - Has appointment scheduled with CCM for patient assistance with Trulicity - Home glucose monitoring: she is to pick up her meter today and start monitoring at home - Patient is not currently taking a statin (unable d/t liver cirrhosis). Patient is taking an ACE-inhibitor/ARB.  - Instruction/counseling given: reminded to get eye exam, reminded to bring blood glucose meter & log to each visit, discussed foot care, discussed diet, and provided printed educational material  Diabetes Health Maintenance Due  Topic Date Due   OPHTHALMOLOGY EXAM  07/14/2018   HEMOGLOBIN A1C  07/17/2021   FOOT EXAM  01/16/2022    Lab Results  Component Value Date   LABMICR 4.6 01/16/2021   LABMICR See below: 12/11/2020   2. Essential hypertension - improving, adding lisinopril to regimen - continue metoprolol - lisinopril (ZESTRIL) 10 MG tablet; Take 1 tablet (10 mg total) by mouth daily.  Dispense: 30 tablet; Refill: 2  3. Tachycardia - Well controlled on current regimen of metoprolol  4. Aortic atherosclerosis ([place]) - unable to take statin due to active liver disease, add aspirin daily - aspirin 81 MG EC tablet; Take 1 tablet (81 mg total) by mouth daily. Swallow whole.  Dispense: 30 tablet; Refill: 12  5. Moderate episode of recurrent major depressive disorder ([place]) - improved from previous visits, will continue to evaluate at each encounter   Follow up plan: Return in about 3 months (around 05/21/2021) for follow-up of chronic  medication conditions.  Lucile Crater, NP Student  I personally was present during the history, physical exam, and medical decision-making activities of this service and have verified that the service and findings are accurately documented in the nurse practitioner student's note.  Hendricks Limes, MSN, APRN, FNP-C [facility]  Subjective:   Patient ID: Grace Stokes, female    DOB: May 30, 1955, 65 y.o.   MRN: [id number]  HPI: Grace Stokes is a 65 y.o. female presenting on 02/18/2021 for Diabetes and Hypertension (4 week follow up )  Hypertension: She was previously on metoprolol, amlodipine, and lisinopril for her blood pressure, but had not been taking any of them prior to her last appointment. She was restarted on her metoprolol at her last appointment. She does not check her blood pressure at home. She denies foot/ankle swelling, chest pain, palpitations, and heart rhythm irregularities. Her BP is improved today but still not at goal. She is agreeable to adding another medicine to help improve control.   Atherosclerosis: she is not on medication for this. She cannot take statins due to active liver disease.   The 10-year ASCVD risk score (Arnett DK, et al., 2019) is: 31%   Values used to calculate the score:     Age: 65 years     Sex: Female     Is Non-Hispanic African American: No     Diabetic: Yes     Tobacco smoker: Yes     Systolic Blood Pressure: 035 mmHg  Is BP treated: Yes     HDL Cholesterol: 45 mg/dL     Total Cholesterol: 194 mg/dL  Tachycardia: controlled with metoprolol.  Diabetes:  Her A1C was 13 on 01/16/21. She was previously prescribed NPH 40 units at breakfast and 55 units with dinner, which she had stopped taking. She was poorly controlled on this regimen over a year ago, with blood sugars ranging from 200s-400s. She was not on oral hyperglycemic medications. At her last visit, she was initiated on Tulicity and given samples to  bridge her to an appointment with CCM to receive patient assistance. She mixed up her appointment dates and thus far has not been able to get in to see her to get assistance. She states she was tolerating this well prior to running out this week.   Current symptoms include: hyperglycemia. Known diabetic complications: nephropathy and cardiovascular disease. Medication compliance: yes, Trulicity 3.97 mg weekly. Current diet:  limited range of foods due to food insecurity, tendency towards high carb . Current exercise: none. Home blood sugar records:  she does not check her blood sugars as she has not yet picked up her new meter . Is she  on ACE inhibitor or angiotensin II receptor blocker? No. Is she on a statin? No, contraindicated due to active liver disease.   She states she will schedule her eye exam with Dr. Marin Comment soon.  Depression: She reports difficulty managing her and her husband's chronic health conditions and has very limited mobility that makes ADLs difficult. At a previous visit, she stated she stopped taking all of her medications because she did not care if she lived or died. She has not been on a medication for depression. She states this is getting better lately. She states her husband is very irritable and often fusses at her for cleaning up or other tasks, and she is tired of arguing with him.   Depression screen [facility] 2/9 02/18/2021 01/16/2021 01/08/2021  Decreased Interest _0 Down, Depressed, Hopeless 0 1 2  PHQ - 2 Score _1 Altered sleeping _2 Tired, decreased energy _3 Change in appetite 0 1 1  Feeling bad or failure about yourself  _4 Trouble concentrating 0 0 1  Moving slowly or fidgety/restless 0 0 2  Suicidal thoughts 0 0 0  PHQ-9 Score _5 Difficult doing work/chores Somewhat difficult Somewhat difficult Somewhat difficult  Some recent data might be hidden   GAD 7 : Generalized Anxiety Score 02/18/2021 01/16/2021 01/08/2021  Nervous, Anxious, on Edge _6 Control/stop worrying _7 Worry too much - different things _8 Trouble relaxing _9 Restless 0 0 1  Easily annoyed or irritable 0 1 1  Afraid - awful might happen _10 Total GAD 7 Score _11 Anxiety Difficulty Somewhat difficult Somewhat difficult Somewhat difficult    ROS: Negative unless specifically indicated above in HPI.   Relevant past medical history reviewed and updated as indicated.   Allergies and medications reviewed and updated.   Current Outpatient Medications:    blood glucose meter kit and supplies KIT, Dispense based on patient and insurance preference. Use up to four times daily as directed., Disp: 1 each, Rfl: 0   entecavir (BARACLUDE) 1 MG tablet, Take 1 mg by mouth daily., Disp: , Rfl:    metoprolol succinate (TOPROL-XL) 100 MG 24 hr  tablet, Take 1 tablet (100 mg total) by mouth daily., Disp: 90 tablet, Rfl: 1   Dulaglutide (TRULICITY) 7.62 GB/1.5VV SOPN, Inject 0.75 mg into the skin once a week. (Patient not taking: Reported on 02/18/2021), Disp: 2 mL, Rfl: 0  Allergies  Allergen Reactions   Gabapentin     Uncontrolled shakes / cold intolerance     Objective:   BP (!) 142/83   Pulse 70   Temp 97.7 F (36.5 C) (Temporal)   Ht _0  (1.549 m)   Wt 56.9 kg   LMP 04/06/2011   BMI 23.69 kg/m    Physical Exam Vitals reviewed.  Constitutional:      General: She is not in acute distress.    Appearance: Normal appearance. She is normal weight. She is not ill-appearing, toxic-appearing or diaphoretic.  HENT:     Head: Normocephalic and atraumatic.     Right Ear: Tympanic membrane, ear canal and external ear normal.     Left Ear: Tympanic membrane, ear canal and external ear normal.     Nose: Nose normal. No congestion.     Mouth/Throat:     Mouth: Mucous membranes are moist.     Pharynx: Oropharynx is clear. No oropharyngeal exudate or posterior oropharyngeal erythema.  Eyes:     Comments: Right eye closed  Cardiovascular:      Rate and Rhythm: Normal rate and regular rhythm.     Pulses: Normal pulses.     Heart sounds: Normal heart sounds.  Pulmonary:     Effort: Pulmonary effort is normal. No respiratory distress.     Breath sounds: Normal breath sounds. No wheezing or rhonchi.  Abdominal:     General: Bowel sounds are normal. There is no distension.     Palpations: Abdomen is soft. There is no mass.  Musculoskeletal:     Cervical back: Normal range of motion.     Thoracic back: Deformity and tenderness present. Decreased range of motion. Scoliosis present.     Lumbar back: Deformity and tenderness present. Decreased range of motion. Scoliosis present.  Skin:    General: Skin is warm and dry.  Neurological:     General: No focal deficit present.     Mental Status: She is alert and oriented to person, place, and time.     Motor: No weakness.     Gait: Gait abnormal (hunched over and uses a walker).  Psychiatric:        Mood and Affect: Mood normal.        Behavior: Behavior normal.        Thought Content: Thought content normal.        Judgment: Judgment normal.

## 2021-02-20 NOTE — Patient Instructions (Signed)
Visit Information   PATIENT GOALS/PLAN OF CARE:  Care Plan : [facility] Care Plan  Updates made by Ilean China, RN since 02/20/2021 12:00 AM     Problem: Chronic Disease Management Needs   Priority: High  Onset Date: 02/17/2021     Long-Range Goal: Work with RN Care Manager Regarding Care Management and [place] with Cirrhosis, Hypertension and Diabetes   Start Date: 02/17/2021  Expected End Date: 02/17/2022  This Visit's Progress: On track  Note:   Current Barriers:  Care Coordination needs related to Financial constraints related to medication cost  Chronic Disease Management support and education needs related to HTN, DMII, and cirrhosis  RNCM Clinical Goal(s):  Patient will continue to work with RN Care Manager and/or Social Worker to address care management and care coordination needs related to HTN, DMII, and cirrhosis as evidenced by adherence to CM Team Scheduled appointments     through collaboration with Consulting civil engineer, provider, and care team.   Interventions: 1:1 collaboration with primary care provider regarding development and update of comprehensive plan of care as evidenced by provider attestation and co-signature Inter-disciplinary care team collaboration (see longitudinal plan of care) Evaluation of current treatment plan related to  self management and patient's adherence to plan as established by provider Provided with RN Care Manager contact information and encouraged to reach out as needed Assessed family/social support Discussed mobility and ability to perform ADLs   Diabetes:  (Status: New goal.) Long Term Goal   Lab Results  Component Value Date   HGBA1C 13.0 (H) 01/16/2021  Assessed patient's understanding of A1c goal: <7% Provided education to patient about basic DM disease process; Reviewed medications with patient and discussed importance of medication adherence;        Counseled on importance of regular laboratory monitoring as  prescribed;        Review of patient status, including review of consultants reports, relevant laboratory and other test results, and medications completed;       Assessed social determinant of health barriers;        Discussed home blood sugar testing. Patient is not testing at this time and does not have a meter. Advised patient that a prescription for a glucometer was sent to [facility] and we received electronic confirmation of receipt Reviewed upcoming appointments Discussed diet  Hypertension: (Status: New goal.) Last practice recorded BP readings:  BP Readings from Last 3 Encounters:  02/18/21 (!) 142/83  01/16/21 (!) 160/86  12/11/20 (!) 152/92  Most recent eGFR/CrCl:  Lab Results  Component Value Date   EGFR 97 01/16/2021    No components found for: CRCL  Reviewed medications with patient and discussed importance of compliance;  Counseled on the importance of exercise goals with target of 150 minutes per week Advised patient, providing education and rationale, to monitor blood pressure daily and record, calling PCP for findings outside established parameters;  Discussed complications of poorly controlled blood pressure such as heart disease, stroke, circulatory complications, vision complications, kidney impairment, sexual dysfunction;  Assessed social determinant of health barriers;    SDOH Barriers (Status: New goal.) Long Term Goal  Patient interviewed and SDOH assessment performed       Advised patient to call [employer] and as about an OTC Pharmacist, hospital. May be able to order supplies and OTC products from them. Assisted patient/caregiver with obtaining information about health plan benefits Verified that patient had received food resource and medical alert system information from care guides Provided information  on Medicaid and LIS Medicare Extra Help Appointment scheduled to assist with online application for LIS    Patient Goals/Self-Care Activities: Patient  will self administer medications as prescribed as evidenced by self report/primary caregiver report  Patient will attend all scheduled provider appointments as evidenced by clinician review of documented attendance to scheduled appointments and patient/caregiver report Patient will continue to perform ADL's independently as evidenced by patient/caregiver report Patient will call provider office for new concerns or questions as evidenced by review of documented incoming telephone call notes and patient report check blood sugar at prescribed times: once daily check feet daily for cuts, sores or redness read food labels for fat, fiber, carbohydrates and portion size - check blood pressure 3 times per week - write blood pressure results in a log or diary - take blood pressure log to all doctor appointments - eat more whole grains, fruits and vegetables, lean meats and healthy fats       Consent to CCM Services: Ms. Siverson was given information about Chronic Care Management services including:  CCM service includes personalized support from designated clinical staff supervised by her physician, including individualized plan of care and coordination with other care providers 24/7 contact phone numbers for assistance for urgent and routine care needs. Service will only be billed when office clinical staff spend 20 minutes or more in a month to coordinate care. Only one practitioner may furnish and bill the service in a calendar month. The patient may stop CCM services at any time (effective at the end of the month) by phone call to the office staff. The patient will be responsible for cost sharing (co-pay) of up to 20% of the service fee (after annual deductible is met).  Patient agreed to services and verbal consent obtained.   Patient verbalizes understanding of instructions provided today and agrees to view in [place].   Plan:Telephone follow up appointment with care management team member  scheduled for:  02/24/21 with RNCM The patient has been provided with contact information for the care management team and has been advised to call with any health related questions or concerns.   Chong Sicilian, BSN, RN-BC Embedded Chronic Care Manager [facility] / [place] Management Direct Dial: [phone]

## 2021-02-20 NOTE — Chronic Care Management (AMB) (Signed)
Chronic Care Management   CCM RN Visit Note  02/17/2021 Name: Grace Stokes MRN: [id number] DOB: 03-12-1956  Subjective: Grace Stokes is a 65 y.o. year old female who is a primary care patient of Loman Brooklyn, FNP. The care management team was consulted for assistance with disease management and care coordination needs.    Engaged with patient by telephone for initial visit in response to provider referral for case management and/or care coordination services.   Consent to Services:  The patient was given the following information about Chronic Care Management services today, agreed to services, and gave verbal consent: 1. CCM service includes personalized support from designated clinical staff supervised by the primary care provider, including individualized plan of care and coordination with other care providers 2. 24/7 contact phone numbers for assistance for urgent and routine care needs. 3. Service will only be billed when office clinical staff spend 20 minutes or more in a month to coordinate care. 4. Only one practitioner may furnish and bill the service in a calendar month. 5.The patient may stop CCM services at any time (effective at the end of the month) by phone call to the office staff. 6. The patient will be responsible for cost sharing (co-pay) of up to 20% of the service fee (after annual deductible is met). Patient agreed to services and consent obtained.  Patient agreed to services and verbal consent obtained.   Assessment: Review of patient past medical history, allergies, medications, health status, including review of consultants reports, laboratory and other test data, was performed as part of comprehensive evaluation and provision of chronic care management services.   SDOH (Social Determinants of Health) assessments and interventions performed:    CCM Care Plan  Allergies  Allergen Reactions   Gabapentin     Uncontrolled shakes / cold intolerance      Outpatient Encounter Medications as of 02/17/2021  Medication Sig   entecavir (BARACLUDE) 1 MG tablet Take 1 mg by mouth daily.   blood glucose meter kit and supplies KIT Dispense based on patient and insurance preference. Use up to four times daily as directed.   metoprolol succinate (TOPROL-XL) 100 MG 24 hr tablet Take 1 tablet (100 mg total) by mouth daily.   [DISCONTINUED] Dulaglutide (TRULICITY) 9.92 EQ/6.8TM SOPN Inject 0.75 mg into the skin once a week. (Patient not taking: Reported on 02/18/2021)   No facility-administered encounter medications on file as of 02/17/2021.    Patient Active Problem List   Diagnosis Date Noted   Aortic atherosclerosis ([place]) 02/18/2021   Moderate episode of recurrent major depressive disorder ([place]) 02/18/2021   Tachycardia 02/18/2021   Statins contraindicated 01/16/2021   Other cirrhosis of liver ([place]) 10/23/2020   History of drug abuse ([place]) 10/23/2020   Mixed hyperlipidemia 04/18/2019   Other pancytopenia ([place]) 03/02/2019   Thrombocytopenia ([place])    Insomnia due to medical condition 09/22/2018   Chronic active viral hepatitis B ([place]) 01/26/2017   Uncontrolled type 2 diabetes mellitus with hyperglycemia ([place]) 01/13/2017   Essential hypertension 12/17/2016   Heart murmur, systolic 19/62/2297   Smoker 08/26/2016   Idiopathic scoliosis 01/28/2016   Cervical spondylosis with myelopathy 07/08/2013    Conditions to be addressed/monitored:HTN, DMII, and cirrhosis  Care Plan : [facility] Care Plan     Problem: Chronic Disease Management Needs   Priority: High  Onset Date: 02/17/2021     Long-Range Goal: Work with RN Care Manager Regarding Care Management and [place] with Cirrhosis, Hypertension and Diabetes  Start Date: 02/17/2021  Expected End Date: 02/17/2022  This Visit's Progress: On track  Note:   Current Barriers:  Care Coordination needs related to Financial constraints related to medication cost  Chronic Disease  Management support and education needs related to HTN, DMII, and cirrhosis  RNCM Clinical Goal(s):  Patient will continue to work with RN Care Manager and/or Social Worker to address care management and care coordination needs related to HTN, DMII, and cirrhosis as evidenced by adherence to CM Team Scheduled appointments     through collaboration with RN Care manager, provider, and care team.   Interventions: 1:1 collaboration with primary care provider regarding development and update of comprehensive plan of care as evidenced by provider attestation and co-signature Inter-disciplinary care team collaboration (see longitudinal plan of care) Evaluation of current treatment plan related to  self management and patient's adherence to plan as established by provider Provided with RN Care Manager contact information and encouraged to reach out as needed Assessed family/social support Discussed mobility and ability to perform ADLs   Diabetes:  (Status: New goal.) Long Term Goal   Lab Results  Component Value Date   HGBA1C 13.0 (H) 01/16/2021  Assessed patient's understanding of A1c goal: <7% Provided education to patient about basic DM disease process; Reviewed medications with patient and discussed importance of medication adherence;        Counseled on importance of regular laboratory monitoring as prescribed;        Review of patient status, including review of consultants reports, relevant laboratory and other test results, and medications completed;       Assessed social determinant of health barriers;        Discussed home blood sugar testing. Patient is not testing at this time and does not have a meter. Advised patient that a prescription for a glucometer was sent to [facility] and we received electronic confirmation of receipt Reviewed upcoming appointments Discussed diet  Hypertension: (Status: New goal.) Last practice recorded BP readings:  BP Readings from Last 3  Encounters:  02/18/21 (!) 142/83  01/16/21 (!) 160/86  12/11/20 (!) 152/92  Most recent eGFR/CrCl:  Lab Results  Component Value Date   EGFR 97 01/16/2021    No components found for: CRCL  Reviewed medications with patient and discussed importance of compliance;  Counseled on the importance of exercise goals with target of 150 minutes per week Advised patient, providing education and rationale, to monitor blood pressure daily and record, calling PCP for findings outside established parameters;  Discussed complications of poorly controlled blood pressure such as heart disease, stroke, circulatory complications, vision complications, kidney impairment, sexual dysfunction;  Assessed social determinant of health barriers;    SDOH Barriers (Status: New goal.) Long Term Goal  Patient interviewed and SDOH assessment performed       Advised patient to call [employer] and as about an OTC Benefits Catalog. May be able to order supplies and OTC products from them. Assisted patient/caregiver with obtaining information about health plan benefits Verified that patient had received food resource and medical alert system information from care guides Provided information on Medicaid and LIS Medicare Extra Help Appointment scheduled to assist with online application for LIS    Patient Goals/Self-Care Activities: Patient will self administer medications as prescribed as evidenced by self report/primary caregiver report  Patient will attend all scheduled provider appointments as evidenced by clinician review of documented attendance to scheduled appointments and patient/caregiver report Patient will continue to perform ADL's independently as evidenced by   patient/caregiver report Patient will call provider office for new concerns or questions as evidenced by review of documented incoming telephone call notes and patient report check blood sugar at prescribed times: once daily check feet daily for cuts, sores  or redness read food labels for fat, fiber, carbohydrates and portion size check blood pressure 3 times per week write blood pressure results in a log or diary take blood pressure log to all doctor appointments eat more whole grains, fruits and vegetables, lean meats and healthy fats   Plan:Telephone follow up appointment with care management team member scheduled for:  02/24/21 with RNCM The patient has been provided with contact information for the care management team and has been advised to call with any health related questions or concerns.   Chong Sicilian, BSN, RN-BC Embedded Chronic Care Manager [facility] / [place] Management Direct Dial: [phone]

## 2021-02-21 ENCOUNTER — Telehealth: Payer: Self-pay | Admitting: Family Medicine

## 2021-02-21 DIAGNOSIS — E1165 Type 2 diabetes mellitus with hyperglycemia: Secondary | ICD-10-CM

## 2021-02-21 MED ORDER — BLOOD GLUCOSE MONITOR KIT: PACK | 0 refills | Status: DC

## 2021-02-21 NOTE — Telephone Encounter (Signed)
Pt said that [facility] is telling her they have not received the blood glucose meter kit and supplies KIT for her to check for diabetes. Please resend.

## 2021-02-21 NOTE — Telephone Encounter (Signed)
Re sent  Attempted to contact patient - Na

## 2021-02-24 ENCOUNTER — Telehealth: Payer: Medicare HMO

## 2021-02-27 ENCOUNTER — Ambulatory Visit: Payer: Medicare HMO | Admitting: Licensed Clinical Social Worker

## 2021-02-27 DIAGNOSIS — I10 Essential (primary) hypertension: Secondary | ICD-10-CM

## 2021-02-27 DIAGNOSIS — E782 Mixed hyperlipidemia: Secondary | ICD-10-CM

## 2021-02-27 DIAGNOSIS — D61818 Other pancytopenia: Secondary | ICD-10-CM

## 2021-02-27 DIAGNOSIS — K7469 Other cirrhosis of liver: Secondary | ICD-10-CM

## 2021-02-27 DIAGNOSIS — E1165 Type 2 diabetes mellitus with hyperglycemia: Secondary | ICD-10-CM

## 2021-02-27 DIAGNOSIS — G4701 Insomnia due to medical condition: Secondary | ICD-10-CM

## 2021-02-27 DIAGNOSIS — Z5941 Food insecurity: Secondary | ICD-10-CM

## 2021-02-27 NOTE — Chronic Care Management (AMB) (Signed)
Chronic Care Management    Clinical Social Work Note  02/27/2021 Name: Grace Stokes MRN: [id number] DOB: 10-Feb-1956  Grace Stokes is a 65 y.o. year old female who is a primary care patient of Grace Brooklyn, Grace Stokes. The CCM team was consulted to assist the patient with chronic disease management and/or care coordination needs related to: Grace Stokes .   Engaged with patient by telephone for follow up visit in response to provider referral for social work chronic care management and care coordination services.   Consent to Services:  The patient was given information about Chronic Care Management services, agreed to services, and gave verbal consent prior to initiation of services.  Please see initial visit note for detailed documentation.   Patient agreed to services and consent obtained.   Assessment: Review of patient past medical history, allergies, medications, and health status, including review of relevant consultants reports was performed today as part of a comprehensive evaluation and provision of chronic care management and care coordination services.     SDOH (Social Determinants of Health) assessments and interventions performed:  SDOH Interventions    Flowsheet Row Most Recent Value  SDOH Interventions   Physical Activity Interventions Other (Comments)  [walking challenges,  uses a cane to help her walk]  Stress Interventions Provide Counseling  [client has stress related to managing her health needs,  client has stress related to managing finances]  Depression Interventions/Treatment  Currently on Treatment        Advanced Directives Status: See [employer] application for related entries.  CCM Care Plan  Allergies  Allergen Reactions   Gabapentin     Uncontrolled shakes / cold intolerance     Outpatient Encounter Medications as of 02/27/2021  Medication Sig   aspirin 81 MG EC tablet Take 1 tablet (81 mg total) by mouth daily. Swallow whole.    blood glucose meter kit and supplies KIT Dispense based on patient and insurance preference. Use up to four times daily as directed.   Dulaglutide (TRULICITY) 1.5 YW/7.3XT SOPN Inject 1.5 mg into the skin once a week.   entecavir (BARACLUDE) 1 MG tablet Take 1 mg by mouth daily.   lisinopril (ZESTRIL) 10 MG tablet Take 1 tablet (10 mg total) by mouth daily.   metoprolol succinate (TOPROL-XL) 100 MG 24 hr tablet Take 1 tablet (100 mg total) by mouth daily.   No facility-administered encounter medications on file as of 02/27/2021.    Patient Active Problem List   Diagnosis Date Noted   Aortic atherosclerosis ([place]) 02/18/2021   Moderate episode of recurrent major depressive disorder ([place]) 02/18/2021   Tachycardia 02/18/2021   Statins contraindicated 01/16/2021   Other cirrhosis of liver ([place]) 10/23/2020   History of drug abuse ([place]) 10/23/2020   Mixed hyperlipidemia 04/18/2019   Other pancytopenia ([place]) 03/02/2019   Thrombocytopenia ([place])    Insomnia due to medical condition 09/22/2018   Chronic active viral hepatitis B ([place]) 01/26/2017   Uncontrolled type 2 diabetes mellitus with hyperglycemia ([place]) 01/13/2017   Essential hypertension 12/17/2016   Heart murmur, systolic 10/07/9483   Smoker 08/26/2016   Idiopathic scoliosis 01/28/2016   Cervical spondylosis with myelopathy 07/08/2013    Conditions to be addressed/monitored: monitor client management of depression issues and of pain issues  Care Plan : [place]  Updates made by Katha Cabal, Grace Stokes since 02/27/2021 12:00 AM     Problem: Emotional Distress      Goal: Emotional Health Supported;manage stress issues; manage depression issues  Start Date: 02/27/2021  Expected End Date: 05/28/2021  This Visit's Progress: On track  Recent Progress: On track  Priority: High  Note:   Current Barriers:  Chronic Mental Health needs related to depression and stress issues Mobility issues Pain issues Challenges in managing  Diabetes Suicidal Ideation/Homicidal Ideation: No  Clinical Social Work Goal(s):  patient will work with SW monthly by telephone or in person to reduce or manage symptoms related to depression issues Grace Stokes will communicate with client in next 30 days to discuss anxiety and stress issues faced by client Grace Stokes will talk with client in next 30 days to discuss financial challenges of client Client to call RNCM as needed in next 30 days to discuss nursing needs of client  Interventions: 1:1 collaboration with Grace Brooklyn, Grace Stokes regarding development and update of comprehensive plan of care as evidenced by provider attestation and co-signature Discussed walking challenges of client. Grace Stokes said she has some difficulty in standing. She uses a cane to help her walk Reviewed health needs of Grace Stokes, Grace Stokes spouse.  She said her spouse was doing better with his health needs.  He is able to help Grace Stokes with some daily activities. Reviewed food procurement for client Provided counseling support for client Discussed pain issues of client. She said she has ongoing pain issues. She is trying to manage her daily pain issues. Grace Stokes stated she went to Grace Stokes in [place] because Grace Limes, Grace Stokes had sent script to pharmacy for meter for her to measure blood sugar levels. Meter was not available. Grace Stokes consulted with Grace Sicilian RN today about this issue. She recommended that SW talk with Grace Stokes, nurse for Grace Stokes.  Grace Stokes communicated today with Grace Stokes, nurse for Grace Stokes. Grace Stokes will see if Grace Limes, Grace Stokes wants to resend script for meter for client to Grace Stokes in [facility]. Client updated on this plan Client reported no recent colonoscopy. She spoke of pain area on her left butt cheek area. She said it hurt when she sat on this area.  Client encouraged to call office to set up face to face visit with Grace Stokes about this issue Discussed medication procurement with client  Patient Self Care  Activities:  Self administers medications as prescribed Attends all scheduled provider appointments Performs ADL's independently Performs IADL's independently  Patient Coping Strengths:  Family Friends  Patient Self Care Deficits:  Mobility issues Pain issues Food procurement challenges Transport challenges  Patient Goals:  - spend time or talk with others at least 2 to 3 times per week - practice relaxation or meditation daily - keep a calendar with appointment dates  Follow Up Plan:  Grace Stokes to call client on 04/29/21 at 2:00 PM to assess client needs.      Norva Riffle.Ruth Tully MSW, Grace Stokes Licensed Clinical Social Worker [facility] Care Management [phone]

## 2021-02-27 NOTE — Patient Instructions (Addendum)
Visit Information  Patient Goals:  Manage emotions; Manage Depression issues; Manage stress issues  Timeframe:  Short-Term Goal Priority:  Medium Progress: On Track Start Date:             02/27/21             Expected End Date:           05/29/21            Follow Up Date 04/29/21 at 2:00 PM   Manage emotions; manage depression issues; manage stress issues    Why is this important?   When you are stressed, down or upset, your body reacts too.  For example, your blood pressure may get higher; you may have a headache or stomachache.  When your emotions get the best of you, your body's ability to fight off cold and flu gets weak.  These steps will help you manage your emotions.     Patient Self Care Activities:  Self administers medications as prescribed Attends all scheduled provider appointments Performs ADL's independently Performs IADL's independently  Patient Coping Strengths:  Family Friends  Patient Self Care Deficits:  Mobility issues Pain issues Food procurement challenges Transport challenges  Patient Goals:  - spend time or talk with others at least 2 to 3 times per week - practice relaxation or meditation daily - keep a calendar with appointment dates  Follow Up Plan:  LCSW to call client on 04/29/21 at 2:00 PM to assess client needs  Norva Riffle.Normal Recinos MSW, LCSW Licensed Clinical Social Worker [facility] Care Management [phone]

## 2021-02-28 ENCOUNTER — Encounter: Payer: Self-pay | Admitting: Family Medicine

## 2021-02-28 ENCOUNTER — Other Ambulatory Visit: Payer: Self-pay

## 2021-02-28 ENCOUNTER — Ambulatory Visit (INDEPENDENT_AMBULATORY_CARE_PROVIDER_SITE_OTHER): Payer: Medicare HMO | Admitting: Family Medicine

## 2021-02-28 ENCOUNTER — Encounter: Payer: Medicare HMO | Admitting: *Deleted

## 2021-02-28 VITALS — BP 142/79 | HR 85 | Temp 97.8°F | Ht 61.0 in | Wt 128.0 lb

## 2021-02-28 DIAGNOSIS — L89321 Pressure ulcer of left buttock, stage 1: Secondary | ICD-10-CM | POA: Diagnosis not present

## 2021-02-28 DIAGNOSIS — F331 Major depressive disorder, recurrent, moderate: Secondary | ICD-10-CM | POA: Diagnosis not present

## 2021-02-28 DIAGNOSIS — E1165 Type 2 diabetes mellitus with hyperglycemia: Secondary | ICD-10-CM

## 2021-02-28 DIAGNOSIS — I10 Essential (primary) hypertension: Secondary | ICD-10-CM

## 2021-02-28 DIAGNOSIS — G479 Sleep disorder, unspecified: Secondary | ICD-10-CM

## 2021-02-28 MED ORDER — MIRTAZAPINE 7.5 MG PO TABS: 7.5000 mg | ORAL_TABLET | Freq: Every day | ORAL | 2 refills | Status: DC

## 2021-02-28 NOTE — Progress Notes (Signed)
Assessment & Plan:  1. Pressure injury of left buttock, stage 1 Discussed changing positions every 2 hours to redistribute her weight. Mepilex foam dressings provided to offer cushion and protection. Education provided on pressure injuries.  - For home use only DME Gel overlay  2. Moderate episode of recurrent major depressive disorder (HCC) Uncontrolled. Started Remeron.  - mirtazapine (REMERON) 7.5 MG tablet; Take 1 tablet (7.5 mg total) by mouth at bedtime.  Dispense: 30 tablet; Refill: 2  3. Difficulty sleeping Uncontrolled. Started Remeron.  - mirtazapine (REMERON) 7.5 MG tablet; Take 1 tablet (7.5 mg total) by mouth at bedtime.  Dispense: 30 tablet; Refill: 2   Follow up plan: Return in about 6 weeks (around 04/11/2021) for Depression & sleep.  Hendricks Limes, MSN, APRN, FNP-C [facility]  Subjective:   Patient ID: Grace Stokes, female    DOB: 1956-02-03, 65 y.o.   MRN: [id number]  HPI: Grace Stokes is a 65 y.o. female presenting on 02/28/2021 for Sore on bottom  Patient reports a sore spot on the left buttock. States the skin is not broken that she knows of, but that it feels deeper under the skin. She does sit a lot in the bed. Her bed is in the living room as that is her room. They have no other living room furniture. She only has hard Field seismologist in the kitchen which she cannot sit in for very long due to scoliosis and back pain. While sitting in the bed, sometimes she pain gets bad and she just has to roll over.   She also reports she is still having some trouble with depression due to her husbands health. She does not sleep well at night. She use to have a prescription for a sleeping pill and states when she is able to sleep through the night, she does much better the next day in general.   Depression screen [facility] 2/9 02/28/2021 02/27/2021 02/18/2021  Decreased Interest _0 Down, Depressed, Hopeless 1 1 0  PHQ - 2 Score _1 Altered sleeping _2 Tired, decreased energy _3 Change in appetite 0 0 0  Feeling bad or failure about yourself  _4 Trouble concentrating 0 0 0  Moving slowly or fidgety/restless 0 1 0  Suicidal thoughts 1 0 0  PHQ-9 Score _5 Difficult doing work/chores Somewhat difficult Somewhat difficult Somewhat difficult  Some recent data might be hidden   GAD 7 : Generalized Anxiety Score 02/28/2021 02/18/2021 01/16/2021 01/08/2021  Nervous, Anxious, on Edge _6 Control/stop worrying _7 Worry too much - different things _8 Trouble relaxing 0 _9 Restless 0 0 0 1  Easily annoyed or irritable 1 0 1 1  Afraid - awful might happen 0 _10 Total GAD 7 Score _11 Anxiety Difficulty Somewhat difficult Somewhat difficult Somewhat difficult Somewhat difficult    ROS: Negative unless specifically indicated above in HPI.   Relevant past medical history reviewed and updated as indicated.   Allergies and medications reviewed and updated.   Current Outpatient Medications:    aspirin 81 MG EC tablet, Take 1 tablet (81 mg total) by mouth daily. Swallow whole., Disp: 30 tablet, Rfl: 12   blood glucose meter kit and supplies KIT, Dispense based on patient and insurance preference. Use up to  four times daily as directed., Disp: 1 each, Rfl: 0   Dulaglutide (TRULICITY) 1.5 ZE/0.9QZ SOPN, Inject 1.5 mg into the skin once a week., Disp: 2 mL, Rfl: 2   entecavir (BARACLUDE) 1 MG tablet, Take 1 mg by mouth daily., Disp: , Rfl:    lisinopril (ZESTRIL) 10 MG tablet, Take 1 tablet (10 mg total) by mouth daily., Disp: 30 tablet, Rfl: 2   metoprolol succinate (TOPROL-XL) 100 MG 24 hr tablet, Take 1 tablet (100 mg total) by mouth daily., Disp: 90 tablet, Rfl: 1  Allergies  Allergen Reactions   Gabapentin     Uncontrolled shakes / cold intolerance     Objective:   BP (!) 142/79   Pulse 85   Temp 97.8 F (36.6 C)   Ht _0  (1.549 m)   Wt 128 lb (58.1 kg)   LMP  04/06/2011   SpO2 97%   BMI 24.19 kg/m    Physical Exam Vitals reviewed.  Constitutional:      General: She is not in acute distress.    Appearance: Normal appearance. She is not ill-appearing, toxic-appearing or diaphoretic.  HENT:     Head: Normocephalic and atraumatic.  Eyes:     General: No scleral icterus.       Right eye: No discharge.        Left eye: No discharge.     Conjunctiva/sclera: Conjunctivae normal.  Cardiovascular:     Rate and Rhythm: Normal rate.  Pulmonary:     Effort: Pulmonary effort is normal. No respiratory distress.  Musculoskeletal:        General: Normal range of motion.     Cervical back: Normal range of motion.  Skin:    General: Skin is warm and dry.     Capillary Refill: Capillary refill takes less than 2 seconds.     Comments: Stage 1 pressure injury to left buttock. Blanchable.   Neurological:     General: No focal deficit present.     Mental Status: She is alert and oriented to person, place, and time. Mental status is at baseline.  Psychiatric:        Mood and Affect: Mood normal.        Behavior: Behavior normal.        Thought Content: Thought content normal.        Judgment: Judgment normal.

## 2021-03-12 DIAGNOSIS — E782 Mixed hyperlipidemia: Secondary | ICD-10-CM

## 2021-03-12 DIAGNOSIS — F17210 Nicotine dependence, cigarettes, uncomplicated: Secondary | ICD-10-CM

## 2021-03-12 DIAGNOSIS — E1165 Type 2 diabetes mellitus with hyperglycemia: Secondary | ICD-10-CM

## 2021-03-12 DIAGNOSIS — D61818 Other pancytopenia: Secondary | ICD-10-CM

## 2021-03-12 DIAGNOSIS — I10 Essential (primary) hypertension: Secondary | ICD-10-CM

## 2021-03-19 ENCOUNTER — Encounter: Payer: Self-pay | Admitting: *Deleted

## 2021-03-21 ENCOUNTER — Ambulatory Visit: Payer: Medicare HMO | Admitting: Pharmacist

## 2021-03-21 DIAGNOSIS — E782 Mixed hyperlipidemia: Secondary | ICD-10-CM

## 2021-03-21 DIAGNOSIS — E1165 Type 2 diabetes mellitus with hyperglycemia: Secondary | ICD-10-CM

## 2021-03-21 DIAGNOSIS — K7469 Other cirrhosis of liver: Secondary | ICD-10-CM

## 2021-03-21 NOTE — Patient Instructions (Signed)
Visit Information  Thank you for taking time to visit with me today. Please don't hesitate to contact me if I can be of assistance to you before our next scheduled telephone appointment.  Following are the goals we discussed today:  Current Barriers:  Unable to independently afford treatment regimen Unable to achieve control of T2DM, HLD  Suboptimal therapeutic regimen for T2DM, HLD  Pharmacist Clinical Goal(s):  patient will verbalize ability to afford treatment regimen achieve control of T2DM, HLD as evidenced by IMPROVED GLYCEMIC CONTROL through collaboration with PharmD and provider.   Interventions: 1:1 collaboration with Loman Brooklyn, FNP regarding development and update of comprehensive plan of care as evidenced by provider attestation and co-signature Inter-disciplinary care team collaboration (see longitudinal plan of care) Comprehensive medication review performed; medication list updated in electronic medical record  Diabetes: New goal. Uncontrolled--A1C 13%; current treatment: TRULICITY 1.5MG ;  CAN'T AFFORD; WILL ENROLL IN PATIENT ASSISTANCE VIA [employer] Denies personal and family history of Medullary thyroid cancer (MTC) SET UP ACCUCHECK GUIDE WITH PATIENT  SHE WAS ABLE TO USE FBG TODAY WAS [place] 2 WEEKS RESTARTED TRULICITY WILL LIKELY HAVE TO ADD ANOTHER MEDICATION AT F/U Current glucose readings: fasting glucose: N/A, post prandial glucose: N/A Discussed meal planning options and Plate method for healthy eating Avoid sugary drinks and desserts Incorporate balanced protein, non starchy veggies, 1 serving of carbohydrate with each meal Increase water intake Increase physical activity as able Current exercise: N/A--WALKS WITH WALKER; UNABLE TO PARTICIPATE IN EXERCISE Counseled on TRULICITY, GLUCOMETER Recommended TRULICITY, WILL ADD ADDITIONAL MEDICATION AT F/U Assessed patient finances. ENROLLED IN THE [employer] PATIENT  ASSISTANCE PROGRAM Hyperlipidemia:  New goal. Uncontrolled; current treatment: N/A DUE TO LIVER;  CONSIDER NON-STATIN ALTERNATIVE WILL CHECK WITH GI Medications previously tried: N/A  Current dietary patterns: RECOMMENDED HEART HEALTHY Lipid Panel     Component Value Date/Time   CHOL 194 01/16/2021 1315   TRIG 199 (H) 01/16/2021 1315   HDL 45 01/16/2021 1315   CHOLHDL 4.3 01/16/2021 1315   LDLCALC 114 (H) 01/16/2021 1315   LABVLDL 35 01/16/2021 1315    Patient Goals/Self-Care Activities patient will:  - take medications as prescribed as evidenced by patient report and record review check glucose DAILY FASTING, document, and provide at future appointments collaborate with provider on medication access solutions engage in dietary modifications by [place]   Please call the care guide team at [phone] if you need to cancel or reschedule your appointment.   If you are experiencing a Mental Health or [place] or need someone to talk to, please call the Suicide and Crisis Lifeline: 988 call the [place] National Suicide Prevention Lifeline: (530) 507-2412 or TTY: 514-820-9760 TTY 662-233-6401) to talk to a trained counselor   The patient verbalized understanding of instructions, educational materials, and care plan provided today and declined offer to receive copy of patient instructions, educational materials, and care plan.     Regina Eck, PharmD, BCPS Clinical Pharmacist, [place]  II Phone [phone]

## 2021-03-21 NOTE — Progress Notes (Signed)
Chronic Care Management Pharmacy Note  03/21/2021 Name:  Grace Stokes MRN:  [id number] DOB:  1956-04-09  Summary: T2DM, HLD  Recommendations/Changes made from today's visit:  Diabetes: New goal. Uncontrolled--A1C 13%; current treatment: TRULICITY 4.5GY;  CAN'T AFFORD; WILL ENROLL IN PATIENT ASSISTANCE VIA [employer] Denies personal and family history of Medullary thyroid cancer (MTC) SET UP ACCUCHECK GUIDE WITH PATIENT  SHE WAS ABLE TO USE FBG TODAY WAS 419 SHE HAS BEEN WITHOUT TRULICITY FOR 2 WEEKS RESTARTED TRULICITY WILL LIKELY HAVE TO ADD ANOTHER MEDICATION AT F/U Current glucose readings: fasting glucose: N/A, post prandial glucose: N/A Discussed meal planning options and Plate method for healthy eating Avoid sugary drinks and desserts Incorporate balanced protein, non starchy veggies, 1 serving of carbohydrate with each meal Increase water intake Increase physical activity as able Current exercise: N/A--WALKS WITH WALKER; UNABLE TO PARTICIPATE IN EXERCISE Counseled on TRULICITY, GLUCOMETER Recommended TRULICITY, WILL ADD ADDITIONAL MEDICATION AT F/U Assessed patient finances. ENROLLED IN THE [employer] PATIENT ASSISTANCE PROGRAM Hyperlipidemia:  New goal. Uncontrolled; current treatment: N/A DUE TO LIVER;  CONSIDER NON-STATIN ALTERNATIVE WILL CHECK WITH GI Medications previously tried: N/A  Current dietary patterns: RECOMMENDED HEART HEALTHY Lipid Panel     Component Value Date/Time   CHOL 194 01/16/2021 1315   TRIG 199 (H) 01/16/2021 1315   HDL 45 01/16/2021 1315   CHOLHDL 4.3 01/16/2021 1315   LDLCALC 114 (H) 01/16/2021 1315   LABVLDL 35 01/16/2021 1315    Patient Goals/Self-Care Activities patient will:  - take medications as prescribed as evidenced by patient report and record review check glucose DAILY FASTING, document, and provide at future appointments collaborate with provider on medication access solutions engage in dietary modifications  by FOLLOWING HEART HEALTHY/HEALTHY PLATE METHOD   Plan: 1 MONTH  Subjective: Grace Stokes is an 65 y.o. year old female who is a primary patient of Loman Brooklyn, FNP.  The CCM team was consulted for assistance with disease management and care coordination needs.    Engaged with patient face to face for initial visit in response to provider referral for pharmacy case management and/or care coordination services.   Consent to Services:  The patient was given information about Chronic Care Management services, agreed to services, and gave verbal consent prior to initiation of services.  Please see initial visit note for detailed documentation.   Patient Care Team: Loman Brooklyn, FNP as PCP - General (Family Medicine) Roosevelt Locks, CRNP as Nurse Practitioner (Nephrology) Shea Evans Norva Riffle, LCSW as [place] Management (Licensed Clinical Social Worker) Ilean China, RN as Case Manager Harlen Labs, MD as Referring Physician (Optometry)  Objective:  Lab Results  Component Value Date   CREATININE 0.66 01/16/2021   CREATININE 0.54 (L) 11/09/2019   CREATININE 0.64 07/26/2019    Lab Results  Component Value Date   HGBA1C 13.0 (H) 01/16/2021   Last diabetic Eye exam:  Lab Results  Component Value Date/Time   HMDIABEYEEXA Retinopathy (A) 07/13/2017 12:00 AM    Last diabetic Foot exam: No results found for: HMDIABFOOTEX      Component Value Date/Time   CHOL 194 01/16/2021 1315   TRIG 199 (H) 01/16/2021 1315   HDL 45 01/16/2021 1315   CHOLHDL 4.3 01/16/2021 1315   LDLCALC 114 (H) 01/16/2021 1315    Hepatic Function Latest Ref Rng & Units 01/16/2021 11/09/2019 07/26/2019  Total Protein 6.0 - 8.5 g/dL 8.1 8.4 7.9  Albumin 3.8 - 4.8 g/dL  3.6(L) 3.4(L) 3.0(L)  AST 0 - 40 IU/L 31 53(H) 55(H)  ALT 0 - 32 IU/L 21 42(H) 40(H)  Alk Phosphatase 44 - 121 IU/L 198(H) 194(H) 150(H)  Total Bilirubin 0.0 - 1.2 mg/dL 1.0 0.7 0.8    Lab Results   Component Value Date/Time   TSH 0.442 (L) 01/28/2016 02:18 PM    CBC Latest Ref Rng & Units 01/16/2021 11/09/2019 07/26/2019  WBC 3.4 - 10.8 x10E3/uL 2.7(L) 2.5(LL) 2.3(LL)  Hemoglobin 11.1 - 15.9 g/dL 12.7 13.2 12.8  Hematocrit 34.0 - 46.6 % 38.0 38.3 37.3  Platelets 150 - 450 x10E3/uL 57(LL) 52(LL) 45(LL)    No results found for: VD25OH  Clinical ASCVD: No  The 10-year ASCVD risk score (Arnett DK, et al., 2019) is: 31%   Values used to calculate the score:     Age: 40 years     Sex: Female     Is Non-Hispanic African American: No     Diabetic: Yes     Tobacco smoker: Yes     Systolic Blood Pressure: 992 mmHg     Is BP treated: Yes     HDL Cholesterol: 45 mg/dL     Total Cholesterol: 194 mg/dL    Other: (CHADS2VASc if Afib, PHQ9 if depression, MMRC or CAT for COPD, ACT, DEXA)  Social History   Tobacco Use  Smoking Status Some Days   Packs/day: 0.50   Types: Cigarettes  Smokeless Tobacco Never   BP Readings from Last 3 Encounters:  02/28/21 (!) 142/79  02/18/21 (!) 142/83  01/16/21 (!) 160/86   Pulse Readings from Last 3 Encounters:  02/28/21 85  02/18/21 70  01/16/21 (!) 111   Wt Readings from Last 3 Encounters:  02/28/21 128 lb (58.1 kg)  02/18/21 125 lb 6.4 oz (56.9 kg)  01/16/21 130 lb 6.4 oz (59.1 kg)    Assessment: Review of patient past medical history, allergies, medications, health status, including review of consultants reports, laboratory and other test data, was performed as part of comprehensive evaluation and provision of chronic care management services.   SDOH:  (Social Determinants of Health) assessments and interventions performed:    CCM Care Plan  Allergies  Allergen Reactions   Gabapentin     Uncontrolled shakes / cold intolerance     Medications Reviewed Today     Reviewed by Lavera Guise, [facility] (Pharmacist) on 03/21/21 at 1014  Med List Status: <None>   Medication Order Taking? Sig Documenting Provider Last Dose Status  Informant  aspirin 81 MG EC tablet [id number] No Take 1 tablet (81 mg total) by mouth daily. Swallow whole. Loman Brooklyn, FNP Taking Active   blood glucose meter kit and supplies KIT [id number] No Dispense based on patient and insurance preference. Use up to four times daily as directed. Loman Brooklyn, FNP Taking Active   Dulaglutide (TRULICITY) 1.5 JJ/9.4RD SOPN [id number] No Inject 1.5 mg into the skin once a week. Loman Brooklyn, FNP Taking Active   entecavir (BARACLUDE) 1 MG tablet [id number] No Take 1 mg by mouth daily. [provider] Taking Active   lisinopril (ZESTRIL) 10 MG tablet [id number] No Take 1 tablet (10 mg total) by mouth daily. Loman Brooklyn, FNP Taking Active   metoprolol succinate (TOPROL-XL) 100 MG 24 hr tablet [id number] No Take 1 tablet (100 mg total) by mouth daily. Loman Brooklyn, FNP Taking Active   mirtazapine (REMERON) 7.5 MG tablet [id number]  Take 1 tablet (7.5 mg total) by mouth at  bedtime. Loman Brooklyn, FNP  Active             Patient Active Problem List   Diagnosis Date Noted   Aortic atherosclerosis ([place]) 02/18/2021   Moderate episode of recurrent major depressive disorder ([place]) 02/18/2021   Tachycardia 02/18/2021   Statins contraindicated 01/16/2021   Other cirrhosis of liver ([place]) 10/23/2020   History of drug abuse ([place]) 10/23/2020   Mixed hyperlipidemia 04/18/2019   Other pancytopenia ([place]) 03/02/2019   Thrombocytopenia ([place])    Insomnia due to medical condition 09/22/2018   Chronic active viral hepatitis B ([place]) 01/26/2017   Uncontrolled type 2 diabetes mellitus with hyperglycemia ([place]) 01/13/2017   Essential hypertension 12/17/2016   Heart murmur, systolic 86/76/7209   Smoker 08/26/2016   Idiopathic scoliosis 01/28/2016   Cervical spondylosis with myelopathy 07/08/2013    Immunization History  Administered Date(s) Administered   Fluad Quad(high Dose 65+) 01/16/2021   Influenza,inj,Quad PF,6+ Mos 01/13/2017, 03/15/2018,  12/23/2018   Moderna Sars-Covid-2 Vaccination 10/14/2019, 11/11/2019    Conditions to be addressed/monitored: HLD and DMII  Care Plan : PHARMD MEDICATION MANAGEMENT  Updates made by Lavera Guise, [place] since 03/21/2021 12:00 AM     Problem: DISEASE PROGRESSION PREVENTION      Long-Range Goal: T2DM, HLD   This Visit's Progress: Not on track  Priority: High  Note:   Current Barriers:  Unable to independently afford treatment regimen Unable to achieve control of T2DM, HLD  Suboptimal therapeutic regimen for T2DM, HLD  Pharmacist Clinical Goal(s):  patient will verbalize ability to afford treatment regimen achieve control of T2DM, HLD as evidenced by IMPROVED GLYCEMIC CONTROL  through collaboration with PharmD and provider.   Interventions: 1:1 collaboration with Loman Brooklyn, FNP regarding development and update of comprehensive plan of care as evidenced by provider attestation and co-signature Inter-disciplinary care team collaboration (see longitudinal plan of care) Comprehensive medication review performed; medication list updated in electronic medical record  Diabetes: New goal. Uncontrolled--A1C 13%; current treatment: TRULICITY 4.7SJ;  CAN'T AFFORD; WILL ENROLL IN PATIENT ASSISTANCE VIA [employer] Denies personal and family history of Medullary thyroid cancer (MTC) SET UP ACCUCHECK GUIDE WITH PATIENT  SHE WAS ABLE TO USE FBG TODAY WAS [place] 2 WEEKS RESTARTED TRULICITY WILL LIKELY HAVE TO ADD ANOTHER MEDICATION AT F/U Current glucose readings: fasting glucose: N/A, post prandial glucose: N/A Discussed meal planning options and Plate method for healthy eating Avoid sugary drinks and desserts Incorporate balanced protein, non starchy veggies, 1 serving of carbohydrate with each meal Increase water intake Increase physical activity as able Current exercise: N/A--WALKS WITH WALKER; UNABLE TO PARTICIPATE IN EXERCISE Counseled on  TRULICITY, GLUCOMETER Recommended TRULICITY, WILL ADD ADDITIONAL MEDICATION AT F/U Assessed patient finances. ENROLLED IN THE [employer] PATIENT ASSISTANCE PROGRAM Hyperlipidemia:  New goal. Uncontrolled; current treatment: N/A DUE TO LIVER;  CONSIDER NON-STATIN ALTERNATIVE WILL CHECK WITH GI Medications previously tried: N/A  Current dietary patterns: RECOMMENDED HEART HEALTHY Lipid Panel     Component Value Date/Time   CHOL 194 01/16/2021 1315   TRIG 199 (H) 01/16/2021 1315   HDL 45 01/16/2021 1315   CHOLHDL 4.3 01/16/2021 1315   LDLCALC 114 (H) 01/16/2021 1315   LABVLDL 35 01/16/2021 1315   Patient Goals/Self-Care Activities patient will:  - take medications as prescribed as evidenced by patient report and record review check glucose DAILY FASTING, document, and provide at future appointments collaborate with provider on medication access solutions engage in dietary modifications by  FOLLOWING HEART HEALTHY/HEALTHY PLATE METHOD      Medication Assistance: Application for TRULICITY-[employer]Y CARES  medication assistance program. in process.  Anticipated assistance start date TBD.  See plan of care for additional detail.  Patient's preferred pharmacy is:  [facility] [address] [place] [address] [place] [address] Phone: [phone] Fax: [phone]  Follow Up:  Patient agrees to Care Plan and Follow-up.  Plan: Telephone follow up appointment with care management team member scheduled for:  1 MONTH   Regina Eck, PharmD, BCPS Clinical Pharmacist, [place]  II Phone [phone]

## 2021-04-11 NOTE — Progress Notes (Signed)
Received notification from [employer] regarding approval for TRULICITY 1.5MG /0.5ML. Patient assistance approved from 04/10/21 to 04/12/22.  MEDICATION WILL SHIP TO PT'S HOME  Phone: [phone]

## 2021-04-16 ENCOUNTER — Encounter: Payer: Self-pay | Admitting: Family Medicine

## 2021-04-16 ENCOUNTER — Ambulatory Visit: Payer: Medicare HMO | Admitting: Family Medicine

## 2021-04-24 ENCOUNTER — Ambulatory Visit (INDEPENDENT_AMBULATORY_CARE_PROVIDER_SITE_OTHER): Payer: Medicare HMO | Admitting: Licensed Clinical Social Worker

## 2021-04-24 DIAGNOSIS — E1165 Type 2 diabetes mellitus with hyperglycemia: Secondary | ICD-10-CM

## 2021-04-24 DIAGNOSIS — K7469 Other cirrhosis of liver: Secondary | ICD-10-CM

## 2021-04-24 DIAGNOSIS — G4701 Insomnia due to medical condition: Secondary | ICD-10-CM

## 2021-04-24 DIAGNOSIS — Z5941 Food insecurity: Secondary | ICD-10-CM

## 2021-04-24 DIAGNOSIS — D61818 Other pancytopenia: Secondary | ICD-10-CM

## 2021-04-24 DIAGNOSIS — E782 Mixed hyperlipidemia: Secondary | ICD-10-CM

## 2021-04-24 DIAGNOSIS — I10 Essential (primary) hypertension: Secondary | ICD-10-CM

## 2021-04-24 DIAGNOSIS — F331 Major depressive disorder, recurrent, moderate: Secondary | ICD-10-CM

## 2021-04-24 NOTE — Chronic Care Management (AMB) (Signed)
Chronic Care Management    Clinical Social Work Note  04/24/2021 Name: Grace Stokes MRN: [id number] DOB: Jan 03, 1956  Grace Stokes is a 66 y.o. year old female who is a primary care patient of Loman Brooklyn, FNP. The CCM team was consulted to assist the patient with chronic disease management and/or care coordination needs related to: [employer] .   Engaged with patient / spouse of patient, [name], by telephone for follow up visit in response to provider referral for social work chronic care management and care coordination services.   Consent to Services:  The patient was given information about Chronic Care Management services, agreed to services, and gave verbal consent prior to initiation of services.  Please see initial visit note for detailed documentation.   Patient agreed to services and consent obtained.   Assessment: Review of patient past medical history, allergies, medications, and health status, including review of relevant consultants reports was performed today as part of a comprehensive evaluation and provision of chronic care management and care coordination services.     SDOH (Social Determinants of Health) assessments and interventions performed:  SDOH Interventions    Flowsheet Row Most Recent Value  SDOH Interventions   Physical Activity Interventions Other (Comments)  [walking challenges. she uses a walker to help her walk]  Stress Interventions Provide Counseling  [client has stress related to insomnia. client has stress related to managing medical needs]  Depression Interventions/Treatment  Currently on Treatment        Advanced Directives Status: See Vynca application for related entries.  CCM Care Plan  Allergies  Allergen Reactions   Gabapentin     Uncontrolled shakes / cold intolerance     Outpatient Encounter Medications as of 04/24/2021  Medication Sig Note   aspirin 81 MG EC tablet Take 1 tablet (81 mg total) by  mouth daily. Swallow whole.    blood glucose meter kit and supplies KIT Dispense based on patient and insurance preference. Use up to four times daily as directed.    Dulaglutide (TRULICITY) 1.5 YE/3.3ID SOPN Inject 1.5 mg into the skin once a week. 03/21/2021: [facility]IA [facility] PATIENT ASSISTANCE   entecavir (BARACLUDE) 1 MG tablet Take 1 mg by mouth daily.    lisinopril (ZESTRIL) 10 MG tablet Take 1 tablet (10 mg total) by mouth daily.    metoprolol succinate (TOPROL-XL) 100 MG 24 hr tablet Take 1 tablet (100 mg total) by mouth daily.    mirtazapine (REMERON) 7.5 MG tablet Take 1 tablet (7.5 mg total) by mouth at bedtime.    No facility-administered encounter medications on file as of 04/24/2021.    Patient Active Problem List   Diagnosis Date Noted   Aortic atherosclerosis ([place]) 02/18/2021   Moderate episode of recurrent major depressive disorder ([place]) 02/18/2021   Tachycardia 02/18/2021   Statins contraindicated 01/16/2021   Other cirrhosis of liver ([place]) 10/23/2020   History of drug abuse ([place]) 10/23/2020   Mixed hyperlipidemia 04/18/2019   Other pancytopenia ([place]) 03/02/2019   Thrombocytopenia ([place])    Insomnia due to medical condition 09/22/2018   Chronic active viral hepatitis B ([place]) 01/26/2017   Uncontrolled type 2 diabetes mellitus with hyperglycemia ([place]) 01/13/2017   Essential hypertension 12/17/2016   Heart murmur, systolic 56/86/1683   Smoker 08/26/2016   Idiopathic scoliosis 01/28/2016   Cervical spondylosis with myelopathy 07/08/2013    Conditions to be addressed/monitored: monitor client management of depression issues  Care Plan : LCSW Care Plan  Updates made by  Katha Cabal, LCSW since 04/24/2021 12:00 AM     Problem: Emotional Distress      Goal: Emotional Health Supported;manage stress issues; manage depression issues   Start Date: 04/24/2021  Expected End Date: 07/18/2021  This Visit's Progress: On track  Recent Progress: On track  Priority: Medium   Note:   Current Barriers:  Chronic Mental Health needs related to depression and stress issues Mobility issues Pain issues Challenges in managing Diabetes Suicidal Ideation/Homicidal Ideation: No  Clinical Social Work Goal(s):  patient will work with SW monthly by telephone or in person to reduce or manage symptoms related to depression issues LCSW will communicate with client in next 30 days to discuss anxiety and stress issues faced by client LCSW will talk with client in next 30 days to discuss financial challenges of client Client to call RNCM as needed in next 30 days to discuss nursing needs of client  Interventions: 1:1 collaboration with Loman Brooklyn, FNP regarding development and update of comprehensive plan of care as evidenced by provider attestation and co-signature Discussed client needs with [name], , spouse of client Discussed walking challenges of client. Tim said client has difficulty in standing. Tim said client has challenges in walking. Client uses a walker to help her walk. Discussed pain issues of client. Tim said client has pain in her back and in her legs. Reviewed food procurement for client. Tim said that client has adequate food supply. Tim said he does most of the cooking since client has difficulty standing Discussed medication procurement with client Reviewed client completion of ADLs. Tim said client can do her ADLs but that it is more difficult for her to complete ADLs at present Reviewed family support for client.  Client has one brother who resides in [place] but [name] said client does not talk very often with her brother. Reviewed financial challenges for client. Tim said that paying monthly bills is challenging and sometimes it is difficult to manage finances for client.  Willa has also spoken in the past with LCSW about financial strain issues for client Discussed transport needs. Tim said Jissel drives car as needed to appointments and to complete  errands Discussed mood of client. Tim said he thought client mood was stable  Patient Self Care Activities:  Self administers medications as prescribed Attends all scheduled provider appointments Performs ADL's independently Performs IADL's independently  Patient Coping Strengths:  Family Friends  Patient Self Care Deficits:  Mobility issues Pain issues Food procurement challenges Transport challenges  Patient Goals:  - spend time or talk with others at least 2 to 3 times per week - practice relaxation or meditation daily - keep a calendar with appointment dates  Follow Up Plan:  LCSW to call client on 04/29/21 at 2:00 PM to assess client needs.      Norva Riffle.Chaniqua Brisby MSW, LCSW Licensed Clinical Social Worker [facility] Care Management [phone]

## 2021-04-24 NOTE — Patient Instructions (Addendum)
Visit Information  Patient Goals:  Manage emotions. Manage Depression issues. Manage stress issues  Timeframe:  Short-Term Goal Priority:  Medium Progress: On Track Start Date:             04/24/21            Expected End Date:           07/18/21            Follow Up Date 04/29/21 at  2:00 PM   Manage emotions; manage depression issues; manage stress issues    Why is this important?   When you are stressed, down or upset, your body reacts too.  For example, your blood pressure may get higher; you may have a headache or stomachache.  When your emotions get the best of you, your body's ability to fight off cold and flu gets weak.  These steps will help you manage your emotions.     Patient Self Care Activities:  Self administers medications as prescribed Attends all scheduled provider appointments Performs ADL's independently Performs IADL's independently  Patient Coping Strengths:  Family Friends  Patient Self Care Deficits:  Mobility issues Pain issues Food procurement challenges Transport challenges  Patient Goals:  - spend time or talk with others at least 2 to 3 times per week - practice relaxation or meditation daily - keep a calendar with appointment dates  Follow Up Plan:  LCSW to call client on 04/29/21 at 2:00 PM to assess client needs  Norva Riffle.Yonis Carreon MSW, LCSW Licensed Clinical Social Worker [facility] Care Management [phone]

## 2021-04-25 ENCOUNTER — Telehealth: Payer: Self-pay | Admitting: Pharmacist

## 2021-04-25 ENCOUNTER — Telehealth: Payer: Medicare HMO

## 2021-04-25 NOTE — Telephone Encounter (Signed)
°  CCM PharmD  Follow Up Note   04/25/2021 Name: Grace Stokes MRN: [id number] DOB: 24-Jul-1955   Referred by: Loman Brooklyn, FNP Reason for referral : Appointment (Unsuccessful outreach)   An unsuccessful telephone outreach was attempted today. The patient was referred to the case management team for assistance with care management and care coordination.   Follow Up Plan: The care management team will reach out to the patient again over the next 7 days.     Regina Eck, PharmD, BCPS Clinical Pharmacist, [place]  [facility] Phone [phone]

## 2021-04-29 ENCOUNTER — Telehealth: Payer: Medicare HMO

## 2021-05-08 ENCOUNTER — Telehealth: Payer: Self-pay

## 2021-05-08 ENCOUNTER — Encounter: Payer: Self-pay | Admitting: Family Medicine

## 2021-05-08 ENCOUNTER — Ambulatory Visit (INDEPENDENT_AMBULATORY_CARE_PROVIDER_SITE_OTHER): Payer: Medicare HMO | Admitting: Family Medicine

## 2021-05-08 VITALS — BP 141/69 | HR 77 | Temp 97.5°F | Ht 61.0 in | Wt 129.4 lb

## 2021-05-08 DIAGNOSIS — D696 Thrombocytopenia, unspecified: Secondary | ICD-10-CM

## 2021-05-08 DIAGNOSIS — L89322 Pressure ulcer of left buttock, stage 2: Secondary | ICD-10-CM

## 2021-05-08 DIAGNOSIS — I10 Essential (primary) hypertension: Secondary | ICD-10-CM

## 2021-05-08 DIAGNOSIS — B181 Chronic viral hepatitis B without delta-agent: Secondary | ICD-10-CM | POA: Diagnosis not present

## 2021-05-08 DIAGNOSIS — E1165 Type 2 diabetes mellitus with hyperglycemia: Secondary | ICD-10-CM

## 2021-05-08 DIAGNOSIS — G479 Sleep disorder, unspecified: Secondary | ICD-10-CM

## 2021-05-08 DIAGNOSIS — R000 Tachycardia, unspecified: Secondary | ICD-10-CM | POA: Diagnosis not present

## 2021-05-08 DIAGNOSIS — I700 Atherosclerosis of aorta: Secondary | ICD-10-CM

## 2021-05-08 DIAGNOSIS — Z23 Encounter for immunization: Secondary | ICD-10-CM

## 2021-05-08 DIAGNOSIS — K7469 Other cirrhosis of liver: Secondary | ICD-10-CM

## 2021-05-08 DIAGNOSIS — F331 Major depressive disorder, recurrent, moderate: Secondary | ICD-10-CM | POA: Diagnosis not present

## 2021-05-08 DIAGNOSIS — G4701 Insomnia due to medical condition: Secondary | ICD-10-CM

## 2021-05-08 DIAGNOSIS — Z5309 Procedure and treatment not carried out because of other contraindication: Secondary | ICD-10-CM | POA: Diagnosis not present

## 2021-05-08 DIAGNOSIS — E782 Mixed hyperlipidemia: Secondary | ICD-10-CM

## 2021-05-08 DIAGNOSIS — D61818 Other pancytopenia: Secondary | ICD-10-CM

## 2021-05-08 LAB — BAYER DCA HB A1C WAIVED: HB A1C (BAYER DCA - WAIVED): 6.9 % — ABNORMAL HIGH (ref 4.8–5.6)

## 2021-05-08 MED ORDER — REPATHA SURECLICK 140 MG/ML ~~LOC~~ SOAJ: 140.0000 mg | SUBCUTANEOUS | 2 refills | Status: DC

## 2021-05-08 MED ORDER — MIRTAZAPINE 7.5 MG PO TABS: 7.5000 mg | ORAL_TABLET | Freq: Every day | ORAL | 1 refills | Status: DC

## 2021-05-08 MED ORDER — TEGADERM HYDROCOLLOID THIN EX MISC: 1.0000 | CUTANEOUS | 2 refills | Status: DC

## 2021-05-08 MED ORDER — LISINOPRIL 20 MG PO TABS: 20.0000 mg | ORAL_TABLET | Freq: Every day | ORAL | 1 refills | Status: DC

## 2021-05-08 NOTE — Telephone Encounter (Signed)
Spoke to [employer] about gel mattress overlay that was faxed in November. They state that they called our office in November and told someone that the insurance states they are not the preferred provider for that equipment.  Advised patient to call their insurance and ask what DME medical do they prefer that is close to them.

## 2021-05-08 NOTE — Progress Notes (Signed)
Assessment & Plan:  1. Type 2 diabetes mellitus with hyperglycemia, without long-term current use of insulin (HCC) Lab Results  Component Value Date   HGBA1C 6.9 (H) 05/08/2021   HGBA1C 13.0 (H) 01/16/2021   HGBA1C 12.9 (H) 11/09/2019    - Diabetes is at goal of A1c < 7. - Medications: continue current medications - Home glucose monitoring: continue monitoring - Patient is not currently taking a statin as they are contraindicated for her. Patient is taking an ACE-inhibitor/ARB.  - Instruction/counseling given: reminded to get eye exam and discussed diet  Diabetes Health Maintenance Due  Topic Date Due   OPHTHALMOLOGY EXAM  07/14/2018   HEMOGLOBIN A1C  11/05/2021   FOOT EXAM  01/16/2022    Lab Results  Component Value Date   LABMICR 4.6 01/16/2021   LABMICR See below: 12/11/2020   - Lipid panel - CBC with Differential/Platelet - CMP14+EGFR - Bayer DCA Hb A1c Waived - Evolocumab (REPATHA SURECLICK) 726 MG/ML SOAJ; Inject 140 mg into the skin every 14 (fourteen) days.  Dispense: 2 mL; Refill: 2 - Pneumococcal conjugate vaccine 20-valent (Prevnar 20)  2. Essential hypertension Well controlled on current regimen.  - Lipid panel - CBC with Differential/Platelet - CMP14+EGFR - lisinopril (ZESTRIL) 20 MG tablet; Take 1 tablet (20 mg total) by mouth daily.  Dispense: 90 tablet; Refill: 1  3. Tachycardia Well controlled on current regimen.  - CMP14+EGFR  4. Mixed hyperlipidemia/Aortic atherosclerosis (HCC)/Statins contraindicated Uncontrolled. Starting Repatha; patient is Child-Pugh class A. She is unable to take statins due to chronic active hepatitis/liver cirrhosis.  - Lipid panel - Evolocumab (REPATHA SURECLICK) 203 MG/ML SOAJ; Inject 140 mg into the skin every 14 (fourteen) days.  Dispense: 2 mL; Refill: 2  7-8. Chronic active viral hepatitis B (HCC)/Other cirrhosis of liver ([place]) Managed by GI.  9. Moderate episode of recurrent major depressive disorder (HCC) Well  controlled on current regimen.  - CMP14+EGFR - mirtazapine (REMERON) 7.5 MG tablet; Take 1 tablet (7.5 mg total) by mouth at bedtime.  Dispense: 90 tablet; Refill: 1  10. Insomnia due to medical condition Well controlled on current regimen.  - CMP14+EGFR - mirtazapine (REMERON) 7.5 MG tablet; Take 1 tablet (7.5 mg total) by mouth at bedtime.  Dispense: 90 tablet; Refill: 1  11. Pressure injury of left buttock, stage 2 ([place]) We will follow-up with [place] regarding gel mattress overlay.  - Hydroactive Dressings (TEGADERM HYDROCOLLOID THIN) MISC; Apply 1 each topically every other day.  Dispense: 15 each; Refill: 2  12. Other pancytopenia ([place]) - CBC with Differential/Platelet  13. Thrombocytopenia (HCC) - CBC with Differential/Platelet  14. Immunization due - Tdap vaccine greater than or equal to 7yo IM - Pneumococcal conjugate vaccine 20-valent (Prevnar 20)   Follow up plan: Return in about 3 months (around 08/06/2021) for annual physical.  Hendricks Limes, MSN, APRN, FNP-C [facility]  Subjective:   Patient ID: Grace Stokes, female    DOB: [birth year], 66 y.o.   MRN: [id number]  HPI: Grace Stokes is a 66 y.o. female presenting on 05/08/2021 for Medical Management of Chronic Issues and pressure injury (Left buttock )  Hypertension: She does not check her blood pressure at home. She denies foot/ankle swelling, chest pain, palpitations, and heart rhythm irregularities.   Aortic Atherosclerosis: she is taking aspirin 81 mg daily. She cannot take statins due to active liver disease.   The 10-year ASCVD risk score (Arnett DK, et al., 2019) is: 30.6%   Values used to  calculate the score:     Age: 66 years     Sex: Female     Is Non-Hispanic African American: No     Diabetic: Yes     Tobacco smoker: Yes     Systolic Blood Pressure: 161 mmHg     Is BP treated: Yes     HDL Cholesterol: 45 mg/dL     Total Cholesterol: 194  mg/dL  Tachycardia: controlled with metoprolol.  Diabetes: Current symptoms include: hyperglycemia. Known diabetic complications: nephropathy and cardiovascular disease. Medication compliance: yes, Trulicity 1.5 mg weekly. Current diet:  she is getting food supplied now . Current exercise: none. Home blood sugar records:  she does occasionally check her blood sugar and ranges 100-250 . Is she  on ACE inhibitor or angiotensin II receptor blocker? Yes (Lisinopril). Is she on a statin? No, contraindicated due to active liver disease.   Pressure injury:  patient has a pressure injury to her left buttock. At our last visit three months ago a gel mattress overlay was ordered; she has not received this has not heard anything from [place].   Depression: started on Remeron at our last visit to help with both depression and sleep. She reports she is sleeping really good now.  Depression screen [facility] 2/9 05/08/2021 04/24/2021 02/28/2021  Decreased Interest _0 Down, Depressed, Hopeless _1 PHQ - 2 Score _2 Altered sleeping 0 1 1  Tired, decreased energy _3 Change in appetite 0 1 0  Feeling bad or failure about yourself  _4 Trouble concentrating 0 1 0  Moving slowly or fidgety/restless 0 1 0  Suicidal thoughts 0 0 1  PHQ-9 Score _5 Difficult doing work/chores Somewhat difficult Somewhat difficult Somewhat difficult  Some recent data might be hidden   GAD 7 : Generalized Anxiety Score 05/08/2021 02/28/2021 02/18/2021 01/16/2021  Nervous, Anxious, on Edge _6 Control/stop worrying 0 _7 Worry too much - different things 0 _8 Trouble relaxing 0 0 1 1  Restless 0 0 0 0  Easily annoyed or irritable 0 1 0 1  Afraid - awful might happen 0 0 1 1  Total GAD 7 Score _9 Anxiety Difficulty Somewhat difficult Somewhat difficult Somewhat difficult Somewhat difficult     ROS: Negative unless specifically indicated above in HPI.   Relevant past medical history  reviewed and updated as indicated.   Allergies and medications reviewed and updated.   Current Outpatient Medications:    aspirin 81 MG EC tablet, Take 1 tablet (81 mg total) by mouth daily. Swallow whole., Disp: 30 tablet, Rfl: 12   blood glucose meter kit and supplies KIT, Dispense based on patient and insurance preference. Use up to four times daily as directed., Disp: 1 each, Rfl: 0   Dulaglutide (TRULICITY) 1.5 WR/6.0AV SOPN, Inject 1.5 mg into the skin once a week., Disp: 2 mL, Rfl: 2   entecavir (BARACLUDE) 1 MG tablet, Take 1 mg by mouth daily., Disp: , Rfl:    lisinopril (ZESTRIL) 10 MG tablet, Take 1 tablet (10 mg total) by mouth daily., Disp: 30 tablet, Rfl: 2   metoprolol succinate (TOPROL-XL) 100 MG 24 hr tablet, Take 1 tablet (100 mg total) by mouth daily., Disp: 90 tablet, Rfl: 1   mirtazapine (REMERON) 7.5 MG tablet, Take 1 tablet (7.5 mg total) by mouth at bedtime., Disp:  30 tablet, Rfl: 2  Allergies  Allergen Reactions   Gabapentin     Uncontrolled shakes / cold intolerance     Objective:   BP (!) 141/69    Pulse 77    Temp (!) 97.5 F (36.4 C) (Temporal)    Ht _0  (1.549 m)    Wt 129 lb 6.4 oz (58.7 kg)    LMP 04/06/2011    SpO2 98%    BMI 24.45 kg/m    Physical Exam Vitals reviewed.  Constitutional:      General: She is not in acute distress.    Appearance: Normal appearance. She is normal weight. She is not ill-appearing, toxic-appearing or diaphoretic.  HENT:     Head: Normocephalic and atraumatic.     Right Ear: Tympanic membrane, ear canal and external ear normal.     Left Ear: Tympanic membrane, ear canal and external ear normal.     Nose: Nose normal. No congestion.     Mouth/Throat:     Mouth: Mucous membranes are moist.     Pharynx: Oropharynx is clear. No oropharyngeal exudate or posterior oropharyngeal erythema.  Cardiovascular:     Rate and Rhythm: Normal rate and regular rhythm.     Pulses: Normal pulses.     Heart sounds: Normal heart  sounds.  Pulmonary:     Effort: Pulmonary effort is normal. No respiratory distress.     Breath sounds: Normal breath sounds. No wheezing or rhonchi.  Abdominal:     General: Bowel sounds are normal. There is no distension.     Palpations: Abdomen is soft. There is no mass.  Musculoskeletal:     Cervical back: Normal range of motion.     Thoracic back: Deformity and tenderness present. Decreased range of motion. Scoliosis present.     Lumbar back: Deformity and tenderness present. Decreased range of motion. Scoliosis present.  Skin:    General: Skin is warm and dry.     Findings: Signs of injury (stage 2 pressure injury to left buttock) present.  Neurological:     General: No focal deficit present.     Mental Status: She is alert and oriented to person, place, and time.     Motor: No weakness.     Gait: Gait abnormal (hunched over and uses a walker).  Psychiatric:        Mood and Affect: Mood normal.        Behavior: Behavior normal.        Thought Content: Thought content normal.        Judgment: Judgment normal.

## 2021-05-09 ENCOUNTER — Telehealth: Payer: Self-pay | Admitting: Family Medicine

## 2021-05-09 LAB — CMP14+EGFR
ALT: 13 IU/L (ref 0–32)
AST: 22 IU/L (ref 0–40)
Albumin/Globulin Ratio: 0.7 — ABNORMAL LOW (ref 1.2–2.2)
Albumin: 3 g/dL — ABNORMAL LOW (ref 3.8–4.8)
Alkaline Phosphatase: 125 IU/L — ABNORMAL HIGH (ref 44–121)
BUN/Creatinine Ratio: 12 (ref 12–28)
BUN: 7 mg/dL — ABNORMAL LOW (ref 8–27)
Bilirubin Total: 1.1 mg/dL (ref 0.0–1.2)
CO2: 26 mmol/L (ref 20–29)
Calcium: 8.3 mg/dL — ABNORMAL LOW (ref 8.7–10.3)
Chloride: 105 mmol/L (ref 96–106)
Creatinine, Ser: 0.59 mg/dL (ref 0.57–1.00)
Globulin, Total: 4.4 g/dL (ref 1.5–4.5)
Glucose: 270 mg/dL — ABNORMAL HIGH (ref 70–99)
Potassium: 4 mmol/L (ref 3.5–5.2)
Sodium: 139 mmol/L (ref 134–144)
Total Protein: 7.4 g/dL (ref 6.0–8.5)
eGFR: 100 mL/min/{1.73_m2} (ref >59)

## 2021-05-09 LAB — CBC WITH DIFFERENTIAL/PLATELET
Basophils Absolute: 0 10*3/uL (ref 0.0–0.2)
Basos: 1 %
EOS (ABSOLUTE): 0 10*3/uL (ref 0.0–0.4)
Eos: 1 %
Hematocrit: 28.4 % — ABNORMAL LOW (ref 34.0–46.6)
Hemoglobin: 8.7 g/dL — CL (ref 11.1–15.9)
Immature Grans (Abs): 0 10*3/uL (ref 0.0–0.1)
Immature Granulocytes: 0 %
Lymphocytes Absolute: 1 10*3/uL (ref 0.7–3.1)
Lymphs: 29 %
MCH: 23.2 pg — ABNORMAL LOW (ref 26.6–33.0)
MCHC: 30.6 g/dL — ABNORMAL LOW (ref 31.5–35.7)
MCV: 76 fL — ABNORMAL LOW (ref 79–97)
Monocytes Absolute: 0.4 10*3/uL (ref 0.1–0.9)
Monocytes: 13 %
Neutrophils Absolute: 1.8 10*3/uL (ref 1.4–7.0)
Neutrophils: 56 %
Platelets: 75 10*3/uL — CL (ref 150–450)
RBC: 3.75 x10E6/uL — ABNORMAL LOW (ref 3.77–5.28)
RDW: 14.5 % (ref 11.7–15.4)
WBC: 3.3 10*3/uL — ABNORMAL LOW (ref 3.4–10.8)

## 2021-05-09 LAB — LIPID PANEL
Chol/HDL Ratio: 4.7 ratio — ABNORMAL HIGH (ref 0.0–4.4)
Cholesterol, Total: 149 mg/dL (ref 100–199)
HDL: 32 mg/dL — ABNORMAL LOW (ref >39)
LDL Chol Calc (NIH): 94 mg/dL (ref 0–99)
Triglycerides: 126 mg/dL (ref 0–149)
VLDL Cholesterol Cal: 23 mg/dL (ref 5–40)

## 2021-05-09 NOTE — Telephone Encounter (Signed)
(  Key: [id number]) Rx #: [id number] Repatha SureClick 140MG /ML auto-injectors  Sent to plan

## 2021-05-09 NOTE — Telephone Encounter (Signed)
Message from Plan PA Case: [id number], Status: Approved, Coverage Starts on: 05/09/2021 12:00:00 AM, Coverage Ends on: 11/05/2021 12:00:00 AM. Questions? Contact [phone].  Pharmacy and patient aware.

## 2021-05-11 ENCOUNTER — Encounter: Payer: Self-pay | Admitting: Family Medicine

## 2021-05-11 DIAGNOSIS — L89322 Pressure ulcer of left buttock, stage 2: Secondary | ICD-10-CM | POA: Insufficient documentation

## 2021-05-12 ENCOUNTER — Other Ambulatory Visit: Payer: Self-pay | Admitting: Family Medicine

## 2021-05-12 DIAGNOSIS — D649 Anemia, unspecified: Secondary | ICD-10-CM

## 2021-05-13 ENCOUNTER — Other Ambulatory Visit: Payer: Medicare HMO

## 2021-05-13 DIAGNOSIS — D649 Anemia, unspecified: Secondary | ICD-10-CM

## 2021-05-13 DIAGNOSIS — E1165 Type 2 diabetes mellitus with hyperglycemia: Secondary | ICD-10-CM

## 2021-05-13 DIAGNOSIS — D61818 Other pancytopenia: Secondary | ICD-10-CM

## 2021-05-13 DIAGNOSIS — I10 Essential (primary) hypertension: Secondary | ICD-10-CM

## 2021-05-13 DIAGNOSIS — F331 Major depressive disorder, recurrent, moderate: Secondary | ICD-10-CM

## 2021-05-13 DIAGNOSIS — E782 Mixed hyperlipidemia: Secondary | ICD-10-CM

## 2021-05-14 LAB — ANEMIA PROFILE B
Basophils Absolute: 0 10*3/uL (ref 0.0–0.2)
Basos: 1 %
EOS (ABSOLUTE): 0 10*3/uL (ref 0.0–0.4)
Eos: 1 %
Ferritin: 16 ng/mL (ref 15–150)
Folate: 11.7 ng/mL (ref >3.0)
Hematocrit: 25.2 % — ABNORMAL LOW (ref 34.0–46.6)
Hemoglobin: 7.3 g/dL — CL (ref 11.1–15.9)
Immature Grans (Abs): 0 10*3/uL (ref 0.0–0.1)
Immature Granulocytes: 1 %
Iron Saturation: 4 % — CL (ref 15–55)
Iron: 15 ug/dL — ABNORMAL LOW (ref 27–139)
Lymphocytes Absolute: 0.4 10*3/uL — ABNORMAL LOW (ref 0.7–3.1)
Lymphs: 29 %
MCH: 22.3 pg — ABNORMAL LOW (ref 26.6–33.0)
MCHC: 29 g/dL — ABNORMAL LOW (ref 31.5–35.7)
MCV: 77 fL — ABNORMAL LOW (ref 79–97)
Monocytes Absolute: 0.2 10*3/uL (ref 0.1–0.9)
Monocytes: 10 %
Neutrophils Absolute: 0.9 10*3/uL — ABNORMAL LOW (ref 1.4–7.0)
Neutrophils: 58 %
Platelets: 57 10*3/uL — CL (ref 150–450)
RBC: 3.28 x10E6/uL — ABNORMAL LOW (ref 3.77–5.28)
RDW: 14.5 % (ref 11.7–15.4)
Retic Ct Pct: 1.7 % (ref 0.6–2.6)
Total Iron Binding Capacity: 364 ug/dL (ref 250–450)
UIBC: 349 ug/dL (ref 118–369)
Vitamin B-12: 431 pg/mL (ref 232–1245)
WBC: 1.5 10*3/uL — CL (ref 3.4–10.8)

## 2021-05-15 ENCOUNTER — Encounter (HOSPITAL_COMMUNITY): Payer: Self-pay | Admitting: Emergency Medicine

## 2021-05-15 ENCOUNTER — Inpatient Hospital Stay (HOSPITAL_COMMUNITY)
Admission: EM | Admit: 2021-05-15 | Discharge: 2021-05-20 | DRG: 300 | Disposition: A | Discharge status: Home / Self Care | Payer: Medicare HMO | Attending: Internal Medicine | Admitting: Internal Medicine

## 2021-05-15 ENCOUNTER — Inpatient Hospital Stay (HOSPITAL_COMMUNITY): Payer: Medicare HMO

## 2021-05-15 ENCOUNTER — Emergency Department (HOSPITAL_COMMUNITY): Payer: Medicare HMO

## 2021-05-15 ENCOUNTER — Other Ambulatory Visit: Payer: Self-pay

## 2021-05-15 DIAGNOSIS — F17210 Nicotine dependence, cigarettes, uncomplicated: Secondary | ICD-10-CM | POA: Diagnosis present

## 2021-05-15 DIAGNOSIS — K766 Portal hypertension: Secondary | ICD-10-CM | POA: Diagnosis present

## 2021-05-15 DIAGNOSIS — B182 Chronic viral hepatitis C: Secondary | ICD-10-CM | POA: Diagnosis not present

## 2021-05-15 DIAGNOSIS — D649 Anemia, unspecified: Secondary | ICD-10-CM

## 2021-05-15 DIAGNOSIS — F32A Depression, unspecified: Secondary | ICD-10-CM | POA: Diagnosis present

## 2021-05-15 DIAGNOSIS — F1910 Other psychoactive substance abuse, uncomplicated: Secondary | ICD-10-CM | POA: Diagnosis present

## 2021-05-15 DIAGNOSIS — D500 Iron deficiency anemia secondary to blood loss (chronic): Secondary | ICD-10-CM | POA: Diagnosis not present

## 2021-05-15 DIAGNOSIS — Z825 Family history of asthma and other chronic lower respiratory diseases: Secondary | ICD-10-CM

## 2021-05-15 DIAGNOSIS — G894 Chronic pain syndrome: Secondary | ICD-10-CM | POA: Diagnosis not present

## 2021-05-15 DIAGNOSIS — Z888 Allergy status to other drugs, medicaments and biological substances status: Secondary | ICD-10-CM

## 2021-05-15 DIAGNOSIS — K625 Hemorrhage of anus and rectum: Secondary | ICD-10-CM | POA: Diagnosis not present

## 2021-05-15 DIAGNOSIS — E440 Moderate protein-calorie malnutrition: Secondary | ICD-10-CM | POA: Diagnosis present

## 2021-05-15 DIAGNOSIS — K922 Gastrointestinal hemorrhage, unspecified: Secondary | ICD-10-CM | POA: Diagnosis not present

## 2021-05-15 DIAGNOSIS — E785 Hyperlipidemia, unspecified: Secondary | ICD-10-CM | POA: Diagnosis present

## 2021-05-15 DIAGNOSIS — K920 Hematemesis: Secondary | ICD-10-CM | POA: Diagnosis not present

## 2021-05-15 DIAGNOSIS — Z66 Do not resuscitate: Secondary | ICD-10-CM | POA: Diagnosis present

## 2021-05-15 DIAGNOSIS — R109 Unspecified abdominal pain: Secondary | ICD-10-CM | POA: Diagnosis not present

## 2021-05-15 DIAGNOSIS — I2510 Atherosclerotic heart disease of native coronary artery without angina pectoris: Secondary | ICD-10-CM | POA: Diagnosis present

## 2021-05-15 DIAGNOSIS — G061 Intraspinal abscess and granuloma: Secondary | ICD-10-CM | POA: Diagnosis not present

## 2021-05-15 DIAGNOSIS — Z8261 Family history of arthritis: Secondary | ICD-10-CM

## 2021-05-15 DIAGNOSIS — K7469 Other cirrhosis of liver: Secondary | ICD-10-CM | POA: Diagnosis present

## 2021-05-15 DIAGNOSIS — I10 Essential (primary) hypertension: Secondary | ICD-10-CM | POA: Diagnosis not present

## 2021-05-15 DIAGNOSIS — G8929 Other chronic pain: Secondary | ICD-10-CM | POA: Diagnosis present

## 2021-05-15 DIAGNOSIS — M419 Scoliosis, unspecified: Secondary | ICD-10-CM | POA: Diagnosis present

## 2021-05-15 DIAGNOSIS — M009 Pyogenic arthritis, unspecified: Secondary | ICD-10-CM | POA: Diagnosis not present

## 2021-05-15 DIAGNOSIS — E1169 Type 2 diabetes mellitus with other specified complication: Secondary | ICD-10-CM | POA: Diagnosis present

## 2021-05-15 DIAGNOSIS — E1142 Type 2 diabetes mellitus with diabetic polyneuropathy: Secondary | ICD-10-CM | POA: Diagnosis present

## 2021-05-15 DIAGNOSIS — M4624 Osteomyelitis of vertebra, thoracic region: Secondary | ICD-10-CM | POA: Diagnosis not present

## 2021-05-15 DIAGNOSIS — Z823 Family history of stroke: Secondary | ICD-10-CM

## 2021-05-15 DIAGNOSIS — Z981 Arthrodesis status: Secondary | ICD-10-CM

## 2021-05-15 DIAGNOSIS — M4804 Spinal stenosis, thoracic region: Secondary | ICD-10-CM | POA: Diagnosis not present

## 2021-05-15 DIAGNOSIS — D61818 Other pancytopenia: Secondary | ICD-10-CM | POA: Diagnosis present

## 2021-05-15 DIAGNOSIS — R932 Abnormal findings on diagnostic imaging of liver and biliary tract: Secondary | ICD-10-CM | POA: Diagnosis not present

## 2021-05-15 DIAGNOSIS — I700 Atherosclerosis of aorta: Secondary | ICD-10-CM | POA: Diagnosis not present

## 2021-05-15 DIAGNOSIS — D62 Acute posthemorrhagic anemia: Secondary | ICD-10-CM | POA: Diagnosis not present

## 2021-05-15 DIAGNOSIS — E119 Type 2 diabetes mellitus without complications: Secondary | ICD-10-CM

## 2021-05-15 DIAGNOSIS — Z6824 Body mass index (BMI) 24.0-24.9, adult: Secondary | ICD-10-CM | POA: Diagnosis not present

## 2021-05-15 DIAGNOSIS — K7460 Unspecified cirrhosis of liver: Secondary | ICD-10-CM | POA: Diagnosis present

## 2021-05-15 DIAGNOSIS — M47814 Spondylosis without myelopathy or radiculopathy, thoracic region: Secondary | ICD-10-CM | POA: Diagnosis not present

## 2021-05-15 DIAGNOSIS — Z20822 Contact with and (suspected) exposure to covid-19: Secondary | ICD-10-CM | POA: Diagnosis not present

## 2021-05-15 DIAGNOSIS — Z79899 Other long term (current) drug therapy: Secondary | ICD-10-CM

## 2021-05-15 DIAGNOSIS — M4644 Discitis, unspecified, thoracic region: Secondary | ICD-10-CM | POA: Diagnosis not present

## 2021-05-15 DIAGNOSIS — B181 Chronic viral hepatitis B without delta-agent: Secondary | ICD-10-CM | POA: Diagnosis not present

## 2021-05-15 DIAGNOSIS — I83899 Varicose veins of unspecified lower extremities with other complications: Principal | ICD-10-CM | POA: Diagnosis present

## 2021-05-15 DIAGNOSIS — R400 Somnolence: Secondary | ICD-10-CM

## 2021-05-15 DIAGNOSIS — F17200 Nicotine dependence, unspecified, uncomplicated: Secondary | ICD-10-CM | POA: Diagnosis present

## 2021-05-15 DIAGNOSIS — Z7982 Long term (current) use of aspirin: Secondary | ICD-10-CM

## 2021-05-15 HISTORY — DX: Other psychoactive substance abuse, uncomplicated: F19.10

## 2021-05-15 HISTORY — DX: Unspecified cirrhosis of liver: K74.60

## 2021-05-15 HISTORY — DX: Atherosclerotic heart disease of native coronary artery without angina pectoris: I25.10

## 2021-05-15 HISTORY — DX: Essential (primary) hypertension: I10

## 2021-05-15 HISTORY — DX: Type 2 diabetes mellitus without complications: E11.9

## 2021-05-15 HISTORY — DX: Depression, unspecified: F32.A

## 2021-05-15 LAB — COMPREHENSIVE METABOLIC PANEL
ALT: 14 U/L (ref 0–44)
AST: 23 U/L (ref 15–41)
Albumin: 2.4 g/dL — ABNORMAL LOW (ref 3.5–5.0)
Alkaline Phosphatase: 104 U/L (ref 38–126)
Anion gap: 7 (ref 5–15)
BUN: 7 mg/dL — ABNORMAL LOW (ref 8–23)
CO2: 23 mmol/L (ref 22–32)
Calcium: 7.9 mg/dL — ABNORMAL LOW (ref 8.9–10.3)
Chloride: 103 mmol/L (ref 98–111)
Creatinine, Ser: 0.46 mg/dL (ref 0.44–1.00)
GFR, Estimated: >60 mL/min (ref >60)
Glucose, Bld: 299 mg/dL — ABNORMAL HIGH (ref 70–99)
Potassium: 3.8 mmol/L (ref 3.5–5.1)
Sodium: 133 mmol/L — ABNORMAL LOW (ref 135–145)
Total Bilirubin: 0.5 mg/dL (ref 0.3–1.2)
Total Protein: 7 g/dL (ref 6.5–8.1)

## 2021-05-15 LAB — CBC WITH DIFFERENTIAL/PLATELET
Abs Immature Granulocytes: 0.01 10*3/uL (ref 0.00–0.07)
Basophils Absolute: 0 10*3/uL (ref 0.0–0.1)
Basophils Relative: 1 %
Eosinophils Absolute: 0 10*3/uL (ref 0.0–0.5)
Eosinophils Relative: 1 %
HCT: 24.8 % — ABNORMAL LOW (ref 36.0–46.0)
Hemoglobin: 7.2 g/dL — ABNORMAL LOW (ref 12.0–15.0)
Immature Granulocytes: 1 %
Lymphocytes Relative: 37 %
Lymphs Abs: 0.7 10*3/uL (ref 0.7–4.0)
MCH: 22.8 pg — ABNORMAL LOW (ref 26.0–34.0)
MCHC: 29 g/dL — ABNORMAL LOW (ref 30.0–36.0)
MCV: 78.5 fL — ABNORMAL LOW (ref 80.0–100.0)
Monocytes Absolute: 0.2 10*3/uL (ref 0.1–1.0)
Monocytes Relative: 10 %
Neutro Abs: 1 10*3/uL — ABNORMAL LOW (ref 1.7–7.7)
Neutrophils Relative %: 50 %
Platelets: 42 10*3/uL — ABNORMAL LOW (ref 150–400)
RBC: 3.16 MIL/uL — ABNORMAL LOW (ref 3.87–5.11)
RDW: 15.5 % (ref 11.5–15.5)
Smear Review: DECREASED
WBC: 1.9 10*3/uL — ABNORMAL LOW (ref 4.0–10.5)
nRBC: 0 % (ref 0.0–0.2)

## 2021-05-15 LAB — GLUCOSE, CAPILLARY: Glucose-Capillary: 221 mg/dL — ABNORMAL HIGH (ref 70–99)

## 2021-05-15 LAB — RESP PANEL BY RT-PCR (FLU A&B, COVID) ARPGX2
Influenza A by PCR: NEGATIVE
Influenza B by PCR: NEGATIVE
SARS Coronavirus 2 by RT PCR: NEGATIVE

## 2021-05-15 LAB — HIV ANTIBODY (ROUTINE TESTING W REFLEX): HIV Screen 4th Generation wRfx: NONREACTIVE

## 2021-05-15 LAB — LIPASE, BLOOD: Lipase: 30 U/L (ref 11–51)

## 2021-05-15 MED ORDER — ENTECAVIR 1 MG PO TABS: 1.0000 mg | ORAL_TABLET | Freq: Every day | ORAL | Status: DC

## 2021-05-15 MED ORDER — BUPRENORPHINE HCL-NALOXONE HCL 2-0.5 MG SL SUBL: 1.0000 | SUBLINGUAL_TABLET | SUBLINGUAL | Status: AC | PRN

## 2021-05-15 MED ORDER — DICYCLOMINE HCL 20 MG PO TABS
20.0000 mg | ORAL_TABLET | Freq: Four times a day (QID) | ORAL | Status: AC | PRN
  Administered 2021-05-16: 20 mg via ORAL
  Filled 2021-05-15 (×2): qty 1

## 2021-05-15 MED ORDER — FAMOTIDINE IN NACL 20-0.9 MG/50ML-% IV SOLN
20.0000 mg | Freq: Once | INTRAVENOUS | Status: AC
  Administered 2021-05-15: 20 mg via INTRAVENOUS
  Filled 2021-05-15: qty 50

## 2021-05-15 MED ORDER — ACETAMINOPHEN 325 MG PO TABS
650.0000 mg | ORAL_TABLET | Freq: Four times a day (QID) | ORAL | Status: DC | PRN
  Administered 2021-05-16 – 2021-05-18 (×4): 650 mg via ORAL
  Filled 2021-05-15 (×4): qty 2

## 2021-05-15 MED ORDER — LOPERAMIDE HCL 2 MG PO CAPS: 2.0000 mg | ORAL_CAPSULE | ORAL | Status: AC | PRN

## 2021-05-15 MED ORDER — LORAZEPAM 2 MG/ML IJ SOLN: 0.5000 mg | Freq: Once | INTRAMUSCULAR | Status: DC

## 2021-05-15 MED ORDER — HYDROXYZINE HCL 25 MG PO TABS
25.0000 mg | ORAL_TABLET | Freq: Four times a day (QID) | ORAL | Status: AC | PRN
  Administered 2021-05-16 – 2021-05-19 (×4): 25 mg via ORAL
  Filled 2021-05-15 (×4): qty 1

## 2021-05-15 MED ORDER — LACTATED RINGERS IV SOLN: INTRAVENOUS | Status: DC

## 2021-05-15 MED ORDER — CLONIDINE HCL 0.1 MG PO TABS
0.1000 mg | ORAL_TABLET | ORAL | Status: AC
  Administered 2021-05-17 – 2021-05-19 (×4): 0.1 mg via ORAL
  Filled 2021-05-15 (×4): qty 1

## 2021-05-15 MED ORDER — SODIUM CHLORIDE 0.9% FLUSH
3.0000 mL | Freq: Two times a day (BID) | INTRAVENOUS | Status: DC
  Administered 2021-05-15 – 2021-05-20 (×9): 3 mL via INTRAVENOUS

## 2021-05-15 MED ORDER — ONDANSETRON HCL 4 MG PO TABS: 4.0000 mg | ORAL_TABLET | Freq: Four times a day (QID) | ORAL | Status: DC | PRN

## 2021-05-15 MED ORDER — ASPIRIN EC 81 MG PO TBEC: 81.0000 mg | DELAYED_RELEASE_TABLET | Freq: Every day | ORAL | Status: DC

## 2021-05-15 MED ORDER — SODIUM CHLORIDE 0.9 % IV SOLN
1.0000 g | Freq: Once | INTRAVENOUS | Status: AC
  Administered 2021-05-15: 1 g via INTRAVENOUS
  Filled 2021-05-15: qty 10

## 2021-05-15 MED ORDER — MIRTAZAPINE 7.5 MG PO TABS
7.5000 mg | ORAL_TABLET | Freq: Every day | ORAL | Status: DC
  Administered 2021-05-15 – 2021-05-19 (×5): 7.5 mg via ORAL
  Filled 2021-05-15 (×6): qty 1

## 2021-05-15 MED ORDER — IOHEXOL 300 MG/ML  SOLN
75.0000 mL | Freq: Once | INTRAMUSCULAR | Status: AC | PRN
  Administered 2021-05-15: 75 mL via INTRAVENOUS

## 2021-05-15 MED ORDER — CLONIDINE HCL 0.1 MG PO TABS
0.1000 mg | ORAL_TABLET | Freq: Every day | ORAL | Status: DC
  Administered 2021-05-20: 0.1 mg via ORAL
  Filled 2021-05-15: qty 1

## 2021-05-15 MED ORDER — INSULIN ASPART 100 UNIT/ML IJ SOLN
0.0000 [IU] | Freq: Three times a day (TID) | INTRAMUSCULAR | Status: DC
  Administered 2021-05-16: 3 [IU] via SUBCUTANEOUS
  Administered 2021-05-16: 5 [IU] via SUBCUTANEOUS
  Administered 2021-05-16 – 2021-05-17 (×2): 3 [IU] via SUBCUTANEOUS
  Administered 2021-05-17: 2 [IU] via SUBCUTANEOUS
  Administered 2021-05-17: 5 [IU] via SUBCUTANEOUS
  Administered 2021-05-18 (×2): 3 [IU] via SUBCUTANEOUS
  Administered 2021-05-18: 5 [IU] via SUBCUTANEOUS
  Administered 2021-05-19: 2 [IU] via SUBCUTANEOUS
  Administered 2021-05-19: 3 [IU] via SUBCUTANEOUS
  Administered 2021-05-20 (×2): 2 [IU] via SUBCUTANEOUS
  Administered 2021-05-20: 3 [IU] via SUBCUTANEOUS

## 2021-05-15 MED ORDER — CLONIDINE HCL 0.1 MG PO TABS
0.1000 mg | ORAL_TABLET | Freq: Four times a day (QID) | ORAL | Status: AC
  Administered 2021-05-15 – 2021-05-17 (×7): 0.1 mg via ORAL
  Filled 2021-05-15 (×7): qty 1

## 2021-05-15 MED ORDER — HYDROCORTISONE ACETATE 25 MG RE SUPP
25.0000 mg | Freq: Two times a day (BID) | RECTAL | Status: DC
  Administered 2021-05-15 – 2021-05-19 (×8): 25 mg via RECTAL
  Filled 2021-05-15 (×12): qty 1

## 2021-05-15 MED ORDER — ONDANSETRON HCL 4 MG/2ML IJ SOLN: 4.0000 mg | Freq: Four times a day (QID) | INTRAMUSCULAR | Status: DC | PRN

## 2021-05-15 MED ORDER — BUPRENORPHINE HCL-NALOXONE HCL 8-2 MG SL SUBL
1.0000 | SUBLINGUAL_TABLET | Freq: Two times a day (BID) | SUBLINGUAL | Status: DC
  Administered 2021-05-16 – 2021-05-20 (×10): 1 via SUBLINGUAL
  Filled 2021-05-15 (×10): qty 1

## 2021-05-15 MED ORDER — ACETAMINOPHEN 650 MG RE SUPP: 650.0000 mg | Freq: Four times a day (QID) | RECTAL | Status: DC | PRN

## 2021-05-15 MED ORDER — METHOCARBAMOL 500 MG PO TABS
500.0000 mg | ORAL_TABLET | Freq: Three times a day (TID) | ORAL | Status: AC | PRN
  Administered 2021-05-16 – 2021-05-19 (×8): 500 mg via ORAL
  Filled 2021-05-15 (×8): qty 1

## 2021-05-15 MED ORDER — GADOBUTROL 1 MMOL/ML IV SOLN
6.0000 mL | Freq: Once | INTRAVENOUS | Status: AC | PRN
  Administered 2021-05-15: 6 mL via INTRAVENOUS

## 2021-05-15 MED ORDER — HYDRALAZINE HCL 20 MG/ML IJ SOLN: 5.0000 mg | INTRAMUSCULAR | Status: DC | PRN

## 2021-05-15 MED ORDER — MORPHINE SULFATE (PF) 2 MG/ML IV SOLN
2.0000 mg | INTRAVENOUS | Status: DC | PRN
  Administered 2021-05-16 – 2021-05-19 (×4): 2 mg via INTRAVENOUS
  Filled 2021-05-15 (×4): qty 1

## 2021-05-15 NOTE — Assessment & Plan Note (Signed)
-  Encourage cessation.   °-This was discussed with the patient and should be reviewed on an ongoing basis.   °-Patch ordered at patient request. °

## 2021-05-15 NOTE — Assessment & Plan Note (Addendum)
-  Patient presenting with 6 months of BRBPR; she does not have rectal bleeding when she is not having bowel movements -Imaging appears to be c/w rectal varices -She is afebrile at this time without tachycardia, and no leukocytosis; will not give antibiotics at this time.  -Continue to monitor for recurrent bleeding -GI following, she did not need endoscopic evaluation -Reports that currently stools appear to be nonbloody

## 2021-05-15 NOTE — Assessment & Plan Note (Signed)
-  Recent A1c was 6.9, indicating good control -hold Trulicity -Cover with moderate-scale SSI -Diabetes coordinator consulted

## 2021-05-15 NOTE — ED Provider Triage Note (Signed)
Emergency Medicine Provider Triage Evaluation Note  Grace Stokes , a 66 y.o. female  was evaluated in triage.  Pt complains of GI bleed.  Patient has been having bright red blood with every bowel movement for a few weeks, reports it looks like she is "peeing blood".  Had blood work done on 05/12/2020, was called yesterday by her PCP with a hemoglobin of 7.3 and told to go to the ED.  She is also having right-sided abdominal pain..  Review of Systems  Positive: AP, rectal bleeding Negative: Syncope   Physical Exam  BP (!) 146/75 (BP Location: Right Arm)    Pulse 97    Temp 98.9 F (37.2 C) (Oral)    Resp 18    LMP 04/06/2011    SpO2 98%  Gen:   Awake, no distress   Resp:  Normal effort  MSK:   Moves extremities without difficulty  Other:  Pale, RLQ TTP  Medical Decision Making  Medically screening exam initiated at 8:05 AM.  Appropriate orders placed.  Grace Stokes was informed that the remainder of the evaluation will be completed by another provider, this initial triage assessment does not replace that evaluation, and the importance of remaining in the ED until their evaluation is complete.  Labs, [place], [place] 05/15/21 [id number]

## 2021-05-15 NOTE — ED Notes (Signed)
Patient transported to MRI 

## 2021-05-15 NOTE — Consult Note (Signed)
Referring Provider: [facility] Primary Care Physician:  Loman Brooklyn, FNP Primary Gastroenterologist:  [facility] (Dr. Watt Climes)  Reason for Consultation:  Abdominal pain and hematochezia  HPI: Grace Stokes is a 66 y.o. 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 Presented to the ED for right-sided abdominal pain and bloody stools.  Patient states for the last 6 months she has had large volume bright red blood per rectum with most bowel movements.  Patient states about a cup of blood per bowel movement.  States "blood gushes" when she has bowel movements.  Patient states she has history of hemorrhoids.  She denies constipation, diarrhea, nausea, vomiting, fevers, and chills.  Denies melena.  States she talked to her primary care doctor and her anemia was checked.  Hemoglobin found to be 7.3 down from previous baseline of 12.7 on 01/16/2021.  Primary care doctor instructed patient to present to the ED.  Patient denies any lightheadedness fatigue dizziness.   Patient reports 80 pound weight loss over the last 3 years.  Patient states she is eating healthier and exercising more.  She also started Trulicity which helped regulate her blood glucose which is when she started noticing weight loss.  No blood thinners, taking baby aspirin  Patient smokes occasionally, denies alcohol use, injects fentanyl at home. Denies any new medications in the last 6 months.  Denies frequent NSAID use.  Denies family history of colon cancer.   Last colonoscopy 03/18/2016 Internal and external hemorrhoids 1 small polyp in the descending colon, polyp not retrieved Otherwise normal recall 10 years   Past Medical History:  Diagnosis Date   Arthritis    Bell's palsy    neuropathy    Cataract    Chronic pain    Renal disorder    Scoliosis    Toxemia in pregnancy     Past Surgical History:  Procedure Laterality Date    ANTERIOR CERVICAL DECOMP/DISCECTOMY FUSION N/A 07/09/2013   Procedure: ANTERIOR CERVICAL DECOMPRESSION/DISCECTOMY FUSION 2 LEVELS;  Surgeon: Consuella Lose, MD;  Location: [facility];  Service: Neurosurgery;  Laterality: N/A;  Anterior Cervical Fusion Cervical five-six, six-seven.   BACK SURGERY     CARPAL TUNNEL RELEASE     CESAREAN SECTION     ELBOW SURGERY Left    I & D EXTREMITY Left 12/17/2016   Procedure: IRRIGATION AND DEBRIDEMENT LEFT HAND;  Surgeon: Iran Planas, MD;  Location: [facility];  Service: Orthopedics;  Laterality: Left;   INCISION / DRAINAGE HAND / FINGER Left 12/17/2016   ROTATOR CUFF REPAIR     SHOULDER SURGERY     TUBAL LIGATION      Prior to Admission medications   Medication Sig Start Date End Date Taking? Authorizing Provider  aspirin 81 MG EC tablet Take 1 tablet (81 mg total) by mouth daily. Swallow whole. 02/18/21   Loman Brooklyn, FNP  blood glucose meter kit and supplies KIT Dispense based on patient and insurance preference. Use up to four times daily as directed. 02/21/21   Loman Brooklyn, FNP  Dulaglutide (TRULICITY) 1.5 EQ/6.8TM SOPN Inject 1.5 mg into the skin once a week. 02/18/21   Loman Brooklyn, FNP  entecavir (BARACLUDE) 1 MG tablet Take 1 mg by mouth daily. 02/10/21 02/05/22  [provider]  Evolocumab (REPATHA SURECLICK) 196 MG/ML SOAJ Inject 140 mg into the skin every 14 (fourteen) days. 05/08/21   Hendricks Limes  F, FNP  Hydroactive Dressings (TEGADERM HYDROCOLLOID THIN) MISC Apply 1 each topically every other day. 05/08/21   Loman Brooklyn, FNP  lisinopril (ZESTRIL) 20 MG tablet Take 1 tablet (20 mg total) by mouth daily. 05/08/21   Loman Brooklyn, FNP  metoprolol succinate (TOPROL-XL) 100 MG 24 hr tablet Take 1 tablet (100 mg total) by mouth daily. 01/16/21   Loman Brooklyn, FNP  mirtazapine (REMERON) 7.5 MG tablet Take 1 tablet (7.5 mg total) by mouth at bedtime. 05/08/21   Loman Brooklyn, FNP    Scheduled Meds:  LORazepam  0.5  mg Intravenous Once   Continuous Infusions: PRN Meds:.  Allergies as of 05/15/2021 - Review Complete 05/15/2021  Allergen Reaction Noted   Gabapentin  07/31/2019    Family History  Problem Relation Age of Onset   COPD Mother    Stroke Mother    Arthritis Brother        back issues   Cancer Brother        skin / back    Obesity Son     Social History   Socioeconomic History   Marital status: Married    Spouse name: Tim   Number of children: 1   Years of education: Not on file   Highest education level: Not on file  Occupational History   Occupation: disability   Tobacco Use   Smoking status: Some Days    Packs/day: 0.50    Types: Cigarettes   Smokeless tobacco: Never  Vaping Use   Vaping Use: Never used  Substance and Sexual Activity   Alcohol use: No   Drug use: Not Currently    Comment: takes Fentanyl   Sexual activity: Not on file  Other Topics Concern   Not on file  Social History Narrative   Son lives 15 minutes away and hasn't visited in over a year.   Social Determinants of Health   Financial Resource Strain: High Risk   Difficulty of Paying Living Expenses: Hard  Food Insecurity: Food Insecurity Present   Worried About Running Out of Food in the Last Year: Sometimes true   Ran Out of Food in the Last Year: Sometimes true  Transportation Needs: No Transportation Needs   Lack of Transportation (Medical): No   Lack of Transportation (Non-Medical): No  Physical Activity: Inactive   Days of Exercise per Week: 0 days   Minutes of Exercise per Session: 0 min  Stress: Stress Concern Present   Feeling of Stress : To some extent  Social Connections: Moderately Isolated   Frequency of Communication with Friends and Family: More than three times a week   Frequency of Social Gatherings with Friends and Family: Twice a week   Attends Religious Services: Never   Marine scientist or Organizations: No   Attends Music therapist: Never    Marital Status: Married  Human resources officer Violence: Not At Risk   Fear of Current or Ex-Partner: No   Emotionally Abused: No   Physically Abused: No   Sexually Abused: No    Review of Systems: Review of Systems  Constitutional:  Positive for weight loss. Negative for chills, diaphoresis, fever and malaise/fatigue.  HENT:  Negative for hearing loss and tinnitus.   Eyes:  Negative for blurred vision and double vision.  Respiratory:  Negative for cough and hemoptysis.   Cardiovascular:  Negative for chest pain and palpitations.  Gastrointestinal:  Positive for abdominal pain (right sided) and blood in stool. Negative for  constipation, diarrhea, heartburn, melena, nausea and vomiting.  Genitourinary:  Negative for dysuria and hematuria.  Musculoskeletal:  Negative for myalgias and neck pain.  Skin:  Negative for itching and rash.  Neurological:  Negative for dizziness, weakness and headaches.  Endo/Heme/Allergies:  Negative for environmental allergies. Does not bruise/bleed easily.  Psychiatric/Behavioral:  Negative for depression and substance abuse.     Physical Exam:Physical Exam Constitutional:      General: She is not in acute distress.    Appearance: She is normal weight.  HENT:     Head: Normocephalic and atraumatic.     Nose: Nose normal.     Mouth/Throat:     Mouth: Mucous membranes are moist.  Eyes:     Pupils: Pupils are equal, round, and reactive to light.     Comments: Pale conjunctiva  Cardiovascular:     Rate and Rhythm: Normal rate and regular rhythm.     Heart sounds: No murmur heard.   No friction rub. No gallop.  Pulmonary:     Effort: Pulmonary effort is normal.     Breath sounds: Normal breath sounds.  Abdominal:     General: Abdomen is flat. Bowel sounds are normal. There is no distension.     Palpations: Abdomen is soft. There is no mass.     Tenderness: There is abdominal tenderness (mild generalized right sided tenderness). There is no right CVA  tenderness, left CVA tenderness, guarding or rebound.     Hernia: No hernia is present.  Musculoskeletal:        General: Normal range of motion.     Cervical back: Normal range of motion and neck supple.  Skin:    General: Skin is warm and dry.     Coloration: Skin is pale.  Neurological:     General: No focal deficit present.     Mental Status: She is alert and oriented to person, place, and time.  Psychiatric:        Mood and Affect: Mood normal.        Behavior: Behavior normal.    Vital signs: Vitals:   05/15/21 1003 05/15/21 1030  BP:  (!) 114/56  Pulse: 94 95  Resp: 11 15  Temp:    SpO2: 100% 95%        GI:  Lab Results: Recent Labs    05/13/21 1506 05/15/21 0804  WBC 1.5* 1.9*  HGB 7.3* 7.2*  HCT 25.2* 24.8*  PLT 57* 42*   BMET Recent Labs    05/15/21 0804  NA 133*  K 3.8  CL 103  CO2 23  GLUCOSE 299*  BUN 7*  CREATININE 0.46  CALCIUM 7.9*   LFT Recent Labs    05/15/21 0804  PROT 7.0  ALBUMIN 2.4*  AST 23  ALT 14  ALKPHOS 104  BILITOT 0.5   PT/INR No results for input(s): LABPROT, INR in the last 72 hours.   Studies/Results: CT Abdomen Pelvis W Contrast  Result Date: 05/15/2021 CLINICAL DATA:  Right lower quadrant abdominal pain, bright red blood in stools. EXAM: CT ABDOMEN AND PELVIS WITH CONTRAST TECHNIQUE: Multidetector CT imaging of the abdomen and pelvis was performed using the standard protocol following bolus administration of intravenous contrast. RADIATION DOSE REDUCTION: This exam was performed according to the departmental dose-optimization program which includes automated exposure control, adjustment of the mA and/or kV according to patient size and/or use of iterative reconstruction technique. CONTRAST:  64m OMNIPAQUE IOHEXOL 300 MG/ML  SOLN COMPARISON:  CT abdomen  and pelvis dated February 03, 2019 FINDINGS: Lower chest: No acute abnormality. Hepatobiliary: Cirrhotic liver morphology. No suspicious focal liver lesions.  Cholelithiasis with no gallbladder wall thickening. No biliary ductal dilation. Pancreas: Atrophic pancreas with no evidence of main pancreatic duct dilation or surrounding inflammatory changes. Spleen: Mild splenomegaly, measuring up to 14.0 cm. Adrenals/Urinary Tract: Bilateral adrenal glands are unremarkable. Kidneys enhance symmetrically with no evidence of hydronephrosis or nephrolithiasis. Bladder is unremarkable. Stomach/Bowel: Stomach is within normal limits. Appendix is not visualized. No evidence of distention, or inflammatory changes. Mild wall thickening of the cecum and ascending colon. Vascular/Lymphatic: Aortic atherosclerosis. No enlarged abdominal or pelvic lymph nodes. Paraesophageal and splenic varices. Rectal and pararectal varices. Reproductive: Uterus and bilateral adnexa are unremarkable. Other: Trace abdominopelvic ascites.  No pneumoperitoneum. Musculoskeletal: New endplate irregularity at T11-T12 with surrounding paravertebral soft tissue. Multilevel degenerative disc disease which is similar to prior exam. IMPRESSION: 1. New endplate irregularity at T11-T12 with surrounding paravertebral soft tissue, concerning for osteomyelitis. Recommend contrast enhanced MRI of the thoracic spine for further evaluation. 2. Rectal and pararectal varices, which could be the cause of bright red blood in stools. 3. Cirrhotic liver morphology with sequela of portal hypertension including mild splenomegaly, upper abdominal varices and trace abdominal ascites. 4. Mild wall thickening of the cecum and ascending colon, likely due to portal colopathy. 5.  Aortic Atherosclerosis (ICD10-I70.0). Electronically Signed   By: Yetta Glassman M.D.   On: 05/15/2021 10:12    Impression: Liver cirrhosis Rectal, pararectal and upper abdominal varices GI bleed Abdominal pain  Pancytopenia Rectal bleeding likely due to rectal varices.  Patient with pancytopenia likely sequela of liver cirrhosis.    WBC 1.9 HGB 7.2  MCV 78.5 Platelets 42  CT abdomen pelvis 05/15/2021 Findings concerning for osteomyelitis Rectal and pararectal varices Cirrhotic liver with sequela of portal hypertension including mild splenomegaly upper abdominal varices and trace abdominal ascites Mild wall thickening of the cecum and ascending colon likely due to portal colopathy  Osteomyelitis Plan: Monitor hemoglobin we will transfuse to keep hemoglobin above 7.0 Rectal bleeding likely due to rectal varices.  Patient with pancytopenia, platelets under 50 not a candidate for colonoscopy at this time until platelets are improved.  Will continue to monitor for increased bleeding.  If bleeding persist may consider general surgery consult for further management of varices.  [facility] will follow   LOS: 0 days   Charlott Rakes  PA-C 05/15/2021, 12:33 PM  Contact #  [phone]

## 2021-05-15 NOTE — Assessment & Plan Note (Addendum)
-  CODE STATUS was discussed with the patient and she would not desire resuscitation and would prefer to die a natural death should that situation arise. -She will need a gold out of facility DNR form at the time of discharge

## 2021-05-15 NOTE — H&P (Signed)
History and Physical    Patient: Grace Stokes BUL:[id number] DOB: 01/06/1956 DOA: 05/15/2021 DOS: the patient was seen and examined on 05/15/2021 PCP: Loman Brooklyn, FNP  Patient coming from: Home - lives with husband; NOK: [place], [place], [phone]   Chief Complaint: GI bleeding  HPI: Grace Stokes is a 66 y.o. female with medical history significant of chronic pain disorder; DM; HTN; CAD; depression; and cirrhosis presenting with GI bleeding.  The patient reports that she has noticed bright red blood with bowel movements for about 6 months.  Her hgb has been slowly falling and she was seen by her PCP yesterday for a routine visit and she was called back and told to come to the ER due to anemia.  She reports chronic back pain that is debilitating; as a result, she injects fentanyl (B forearms are injection sites) to control the pain.  She knew that she was at risk for withdrawal if she came to the hospital and was reluctant; however, she wants to get better and is willing to try suboxone to try to get off the fentanyl.  She denies fevers.  She is not very mobile due to the back pain.  She has sacral TTP also.    ER Course:  Rectal bleeding.  Saw PCP recently, Hgb 7.3, down from 12.7.  Currently 7.2.  Appears to be slow bleed.  GI will consult, give clears for now.  CT with varices, has h/o cirrhosis.  Will give Rocephin.  Chronic back pain, not new/different, MRI ordered.     Review of Systems: As mentioned in the history of present illness. All other systems reviewed and are negative. Past Medical History:  Diagnosis Date   Arthritis    Bell's palsy    neuropathy    CAD (coronary artery disease)    Cataract    Chronic pain    Cirrhosis ([place])    Depression    Diabetes mellitus type 2 in nonobese ([place])    Hypertension    IV drug abuse ([place])    Scoliosis    Past Surgical History:  Procedure Laterality Date   ANTERIOR CERVICAL DECOMP/DISCECTOMY FUSION  N/A 07/09/2013   Procedure: ANTERIOR CERVICAL DECOMPRESSION/DISCECTOMY FUSION 2 LEVELS;  Surgeon: Consuella Lose, MD;  Location: [facility];  Service: Neurosurgery;  Laterality: N/A;  Anterior Cervical Fusion Cervical five-six, six-seven.   BACK SURGERY     CARPAL TUNNEL RELEASE     CESAREAN SECTION     ELBOW SURGERY Left    I & D EXTREMITY Left 12/17/2016   Procedure: IRRIGATION AND DEBRIDEMENT LEFT HAND;  Surgeon: Iran Planas, MD;  Location: [facility];  Service: Orthopedics;  Laterality: Left;   INCISION / DRAINAGE HAND / FINGER Left 12/17/2016   ROTATOR CUFF REPAIR     SHOULDER SURGERY     TUBAL LIGATION     Social History:  reports that she has been smoking cigarettes. She has been smoking an average of .5 packs per day. She has never used smokeless tobacco. She reports that she does not currently use alcohol. She reports current drug use.  Allergies  Allergen Reactions   Gabapentin Other (See Comments)    Uncontrolled shakes / cold intolerance     Family History  Problem Relation Age of Onset   COPD Mother    Stroke Mother    Arthritis Brother        back issues   Cancer Brother        skin /  back    Obesity Son     Prior to Admission medications   Medication Sig Start Date End Date Taking? Authorizing Provider  aspirin 81 MG EC tablet Take 1 tablet (81 mg total) by mouth daily. Swallow whole. 02/18/21   Loman Brooklyn, FNP  blood glucose meter kit and supplies KIT Dispense based on patient and insurance preference. Use up to four times daily as directed. 02/21/21   Loman Brooklyn, FNP  Dulaglutide (TRULICITY) 1.5 YE/2.3VK SOPN Inject 1.5 mg into the skin once a week. 02/18/21   Loman Brooklyn, FNP  entecavir (BARACLUDE) 1 MG tablet Take 1 mg by mouth daily. 02/10/21 02/05/22  [provider]  Evolocumab (REPATHA SURECLICK) 122 MG/ML SOAJ Inject 140 mg into the skin every 14 (fourteen) days. 05/08/21   Loman Brooklyn, FNP  Hydroactive Dressings (TEGADERM  HYDROCOLLOID THIN) MISC Apply 1 each topically every other day. 05/08/21   Loman Brooklyn, FNP  lisinopril (ZESTRIL) 20 MG tablet Take 1 tablet (20 mg total) by mouth daily. 05/08/21   Loman Brooklyn, FNP  metoprolol succinate (TOPROL-XL) 100 MG 24 hr tablet Take 1 tablet (100 mg total) by mouth daily. 01/16/21   Loman Brooklyn, FNP  mirtazapine (REMERON) 7.5 MG tablet Take 1 tablet (7.5 mg total) by mouth at bedtime. 05/08/21   Loman Brooklyn, FNP    Physical Exam: Vitals:   05/15/21 1030 05/15/21 1402 05/15/21 1632 05/15/21 1705  BP: (!) 114/56 (!) 104/59 137/80 124/73  Pulse: 95 90 96   Resp: '15 16 16 16  ' Temp:   98.4 F (36.9 C) 98.1 F (36.7 C)  TempSrc:   Oral Oral  SpO2: 95% 96% 99% 99%   General:  Appears frail and chronically ill Eyes:  EOMI, normal lids, iris ENT:  grossly normal hearing, lips & tongue, mmm; artificial dentition Neck:  no LAD, masses or thyromegaly Cardiovascular:  RRR, no m/r/g. No LE edema.  Respiratory:   CTA bilaterally with no wheezes/rales/rhonchi.  Normal respiratory effort. Abdomen:  soft, NT, ND Back:   normal alignment, TTP along lumber spine and also around coccyx Skin:  no rash or induration seen on limited exam Musculoskeletal:  grossly normal tone BUE/BLE, good ROM, no bony abnormality Psychiatric:  blunted mood and affect, speech fluent and appropriate, AOx3 Neurologic:  CN 2-12 grossly intact, moves all extremities in coordinated fashion   Radiological Exams on Admission: Independently reviewed - see discussion in A/P where applicable  MR THORACIC SPINE W WO CONTRAST  Result Date: 05/15/2021 CLINICAL DATA:  Thoracic spine osteomyelitis EXAM: MRI THORACIC WITHOUT AND WITH CONTRAST TECHNIQUE: Multiplanar and multiecho pulse sequences of the thoracic spine were obtained without and with intravenous contrast. CONTRAST:  66m GADAVIST GADOBUTROL 1 MMOL/ML IV SOLN COMPARISON:  CT abdomen/pelvis 05/15/2021, thoracic spine MRI 02/03/2019  FINDINGS: Alignment:  Normal. Vertebrae: Vertebral body heights are preserved. There is confluent T1 hypointensity and T2/STIR hyperintensity in the T11 and T12 vertebral bodies with marked endplate irregularity. There is associated enhancement of the vertebral bodies and heterogeneous enhancement of the disc space. Findings are consistent with discitis/osteomyelitis. There is additional T2/STIR hyperintensity along the inferior aspect of the T10 vertebral body without significant endplate irregularity. There is no significant postcontrast enhancement, but the T10 vertebral body was normal in appearance on the prior study from 2020. There is edema and enhancement extending into the right posterior elements at T11 and T12 with perifacetal enhancement raising suspicion for early septic arthritis (11-9). There  is no convincing evidence of septic arthritis on the left. There is thin epidural phlegmon both dorsally and ventrally measuring up to approximately 4 mm thick along the posterior T11 endplate and 2-3 mm thick dorsally. There is no central non enhancement to suggest formed abscess. The phlegmon results in mild narrowing of the spinal canal without mass effect on the cord. There is an intraosseous hemangioma in the L1 vertebral body, unchanged. Cord: Normal in signal and morphology. There is no abnormal cord enhancement. Paraspinal and other soft tissues: There is prevertebral/right paravertebraln edema and enhancement along the anterior endplates from O67-T24 through T12-L1 with extension to the right neural foramina at T10-T11 through T12-L1. There is no definite organized prevertebral abscess, though the sagittal and axial postcontrast images are motion degraded. Disc levels: There is mild disc desiccation and narrowing at the remaining levels in the thoracic spine. There is mild multilevel facet arthropathy. There are scattered small disc protrusions. There is moderate left neural foraminal stenosis at T10-T11  and moderate to severe bilateral neural foraminal stenosis at T11-T12. There is mild spinal canal stenosis at T11-T12 primarily due to the epidural phlegmon, as described above. There is no other significant spinal canal stenosis in the thoracic spine. A prominent left facet spur is seen at L2-L3, incompletely evaluated but appears grossly similar to the prior lumbar spine MRI from 2020.n IMPRESSION: 1. Findings consistent with discitis/osteomyelitis at T11-T12 and probable early infection at T10. Findings are also suspicious for right septic arthritis at T11-T12. 2. Thin dorsal and ventral epidural phlegmon centered at T11-T12 without evidence of organized epidural abscess. This results in mild spinal canal stenosis without cord compression. 3. Right prevertebral/paravertebral phlegmon extending to the right neural foramina at T10-T11 through T12-L1 without convincing evidence of abscess formation. Electronically Signed   By: Valetta Mole M.D.   On: 05/15/2021 12:51   CT Abdomen Pelvis W Contrast  Result Date: 05/15/2021 CLINICAL DATA:  Right lower quadrant abdominal pain, bright red blood in stools. EXAM: CT ABDOMEN AND PELVIS WITH CONTRAST TECHNIQUE: Multidetector CT imaging of the abdomen and pelvis was performed using the standard protocol following bolus administration of intravenous contrast. RADIATION DOSE REDUCTION: This exam was performed according to the departmental dose-optimization program which includes automated exposure control, adjustment of the mA and/or kV according to patient size and/or use of iterative reconstruction technique. CONTRAST:  51m OMNIPAQUE IOHEXOL 300 MG/ML  SOLN COMPARISON:  CT abdomen and pelvis dated February 03, 2019 FINDINGS: Lower chest: No acute abnormality. Hepatobiliary: Cirrhotic liver morphology. No suspicious focal liver lesions. Cholelithiasis with no gallbladder wall thickening. No biliary ductal dilation. Pancreas: Atrophic pancreas with no evidence of main  pancreatic duct dilation or surrounding inflammatory changes. Spleen: Mild splenomegaly, measuring up to 14.0 cm. Adrenals/Urinary Tract: Bilateral adrenal glands are unremarkable. Kidneys enhance symmetrically with no evidence of hydronephrosis or nephrolithiasis. Bladder is unremarkable. Stomach/Bowel: Stomach is within normal limits. Appendix is not visualized. No evidence of distention, or inflammatory changes. Mild wall thickening of the cecum and ascending colon. Vascular/Lymphatic: Aortic atherosclerosis. No enlarged abdominal or pelvic lymph nodes. Paraesophageal and splenic varices. Rectal and pararectal varices. Reproductive: Uterus and bilateral adnexa are unremarkable. Other: Trace abdominopelvic ascites.  No pneumoperitoneum. Musculoskeletal: New endplate irregularity at T11-T12 with surrounding paravertebral soft tissue. Multilevel degenerative disc disease which is similar to prior exam. IMPRESSION: 1. New endplate irregularity at T11-T12 with surrounding paravertebral soft tissue, concerning for osteomyelitis. Recommend contrast enhanced MRI of the thoracic spine for further evaluation. 2. Rectal and  pararectal varices, which could be the cause of bright red blood in stools. 3. Cirrhotic liver morphology with sequela of portal hypertension including mild splenomegaly, upper abdominal varices and trace abdominal ascites. 4. Mild wall thickening of the cecum and ascending colon, likely due to portal colopathy. 5.  Aortic Atherosclerosis (ICD10-I70.0). Electronically Signed   By: Yetta Glassman M.D.   On: 05/15/2021 10:12    EKG: Independently reviewed.  NSR with rate 96; nonspecific ST changes with no evidence of acute ischemia   Labs on Admission: I have personally reviewed the available labs and imaging studies at the time of the admission.  Pertinent labs:    Glucose 299 Albumin 2.4 WBC 1.9 - stable Hgb 7.2; 7.3 on 1/31; 8.7 on 1/26; 12.7 on 10/6 A1c 6.9 on 1/26 COVID/flu  negative    Assessment and Plan: * Acute GI bleeding- (present on admission) -Patient presenting with 6 months of BRBPR; she does not have rectal bleeding when she is not having bowel movements -Imaging appears to be c/w rectal varices -She is afebrile at this time without tachycardia, and no leukocytosis; will not give antibiotics at this time.  -Continue to monitor for recurrent bleeding -GI consult has been requested -May need colonoscopy, but currently with other equally significant medical problems.  ABLA -Her Hgb has been steadily decreasing but not rapidly so -Type and screen were done in ED.  -Monitor closely and follow cbc q12h, transfuse as necessary for Hbg <7  Discitis of thoracic region- (present on admission) -Patient with IVDA with h/o chronic pain presenting with back pain. -CT A/P was done for GI bleeding and showed concern for discitis -MRI showed paravertebral infection/inflammation at T10-12 with phlegmons but no clear abscess -IR evaluation has been ordered. -Will need antibiotics for 4-6 weeks - holding for now pending biopsy -ID consulted -With vertebral osteo, needs testing for TB; will add quantiferon gold in addition to HIV. -Further important considerations include nutrition (will order consult), diabetes control (will consult DM coordinator), tobacco cessation (see below).  IV drug abuse ([place])- (present on admission) -Patient acknowledges use of IV fentanyl -Concerned about withdrawal -She reports a desire to quit and recognizes that ongoing use is life-threatening -Will initiate suboxone therapy now; while this may precipitate early withdrawal, hopefully she can reach a level of withdrawal symptom relief that will enable her to remain hospitalized and complete treatment -Will use the Suboxone therapy order set -If patient experiences precipitated withdrawal, will give small, frequent doses of buprenorphine (2 mg q 1-2h with adjunctive Clonidine 0.1 mg  q8h, Zofran, and NSAIDs) until the precipitated withdrawal is overcome -Will monitor on COWS protocol (5-12 mild symptoms; 13-24 moderate symptoms; 25-36 moderately severe symptoms; >36 severe symptoms) -TOC team consult for substance abuse education/support -prn orders from the Clonidine withdrawal order set were also ordered.  DNR (do not resuscitate)- (present on admission) -I have discussed code status with the patient and she would not desire resuscitation and would prefer to die a natural death should that situation arise. -She will need a gold out of facility DNR form at the time of discharge  CAD (coronary artery disease)- (present on admission) -Continue ASA -Statin intolerant so on Repatha   Depression- (present on admission) -Continue Remeron  Chronic pain- (present on admission) -I have reviewed this patient in the [place] Controlled Substances Reporting System. She is not receiving controlled medications and rather is using only illicit fentanyl.  Diabetes mellitus type 2 in nonobese ([facility]) -Recent A1c was 6.9, indicating good  control -hold Trulicity -Cover with moderate-scale SSI -Diabetes coordinator consulted  Other cirrhosis of liver ([place])- (present on admission) -Thought to be due to Hep B although ETOH/drug use is also a consideration -Normal LFTs at the time of admission -No evidence of encephalopathy -GI is consulting  Other pancytopenia ([place])- (present on admission) -May need outpatient hematology evaluation -Platelets need to be higher for disk aspiration -May need transfusion if Hgb <7  Chronic active viral hepatitis B ([place])- (present on admission) -Continue entecavir  Essential hypertension- (present on admission) -Hold home metoprolol and lisinopril for now since patient is on the Clonidine protocol  Smoker- (present on admission) -Encourage cessation.   -This was discussed with the patient and should be reviewed on an ongoing basis.   -Patch ordered  at patient request.       Advance Care Planning:   Code Status: DNR   Consults: GI; ID; IR; DM coordinator; nutrition; TOC team  DVT Prophylaxis: SCDs  Family Communication: None present at the time of admission, although her husband was present later in the day  Severity of Illness: The appropriate patient status for this patient is INPATIENT. Inpatient status is judged to be reasonable and necessary in order to provide the required intensity of service to ensure the patient's safety. The patient's presenting symptoms, physical exam findings, and initial radiographic and laboratory data in the context of their chronic comorbidities is felt to place them at high risk for further clinical deterioration. Furthermore, it is not anticipated that the patient will be medically stable for discharge from the hospital within 2 midnights of admission.   * I certify that at the point of admission it is my clinical judgment that the patient will require inpatient hospital care spanning beyond 2 midnights from the point of admission due to high intensity of service, high risk for further deterioration and high frequency of surveillance required.*  Author: Karmen Bongo, MD 05/15/2021 6:38 PM  For on call review [email or web address].

## 2021-05-15 NOTE — ED Provider Notes (Signed)
[facility]Y DE[facility]MENT Provider Note   CSN: [id number] Arrival date & time: 05/15/21  7915     History  Chief Complaint  Patient presents with   Abdominal Pain   Rectal Bleeding   Abnormal Lab    [facility] is a 66 y.o. female.   Abdominal Pain Associated symptoms: hematochezia   Rectal Bleeding Associated symptoms: abdominal pain   Abnormal Lab  Patient with medical history of type 2 diabetes, chronic hepatitis B followed by [facility] [place] ([place], [place]) hypertension, hyperlipidemia presents due to right-sided abdominal pain and bloody stool.  Was seen by her primary doctor on 05/12/2021, at that time her CBC was notable for hemoglobin of 7.3 advised to go to the ED for further evaluation.  She states she has been having right lower quadrant abdominal pain intermittently for the last few weeks, has not been seen for it because she "thought it would go away".  States she has having bright red blood with bowel movements that "looks like she is pooping gushes of blood".  She takes a baby aspirin daily but not on any anticoagulation.  Social history: Patient is a daily cigarette smoker. History of polysubstance use   2021: chronic HepB  2017. [facility]. Can't see any colonoscopy history on chart review.   Home Medications Prior to Admission medications   Medication Sig Start Date End Date Taking? Authorizing Provider  aspirin 81 MG EC tablet Take 1 tablet (81 mg total) by mouth daily. Swallow whole. 02/18/21   Loman Brooklyn, FNP  blood glucose meter kit and supplies KIT Dispense based on patient and insurance preference. Use up to four times daily as directed. 02/21/21   Loman Brooklyn, FNP  Dulaglutide (TRULICITY) 1.5 AV/6.9VX SOPN Inject 1.5 mg into the skin once a week. 02/18/21   Loman Brooklyn, FNP  entecavir (BARACLUDE) 1 MG tablet Take 1 mg by mouth daily. 02/10/21 02/05/22  [provider]   Evolocumab (REPATHA SURECLICK) 480 MG/ML SOAJ Inject 140 mg into the skin every 14 (fourteen) days. 05/08/21   Loman Brooklyn, FNP  Hydroactive Dressings (TEGADERM HYDROCOLLOID THIN) MISC Apply 1 each topically every other day. 05/08/21   Loman Brooklyn, FNP  lisinopril (ZESTRIL) 20 MG tablet Take 1 tablet (20 mg total) by mouth daily. 05/08/21   Loman Brooklyn, FNP  metoprolol succinate (TOPROL-XL) 100 MG 24 hr tablet Take 1 tablet (100 mg total) by mouth daily. 01/16/21   Loman Brooklyn, FNP  mirtazapine (REMERON) 7.5 MG tablet Take 1 tablet (7.5 mg total) by mouth at bedtime. 05/08/21   Loman Brooklyn, FNP      Allergies    Gabapentin    Review of Systems   Review of Systems  Gastrointestinal:  Positive for abdominal pain and hematochezia.   Physical Exam Updated Vital Signs BP (!) 114/56    Pulse 95    Temp 98.9 F (37.2 C) (Oral)    Resp 15    LMP 04/06/2011    SpO2 95%  Physical Exam Vitals and nursing note reviewed. Exam conducted with a chaperone present.  Constitutional:      Appearance: Normal appearance.  HENT:     Head: Normocephalic and atraumatic.     Mouth/Throat:     Mouth: Mucous membranes are dry.  Eyes:     General: No scleral icterus.       Right eye: No discharge.  Left eye: No discharge.     Extraocular Movements: Extraocular movements intact.     Pupils: Pupils are equal, round, and reactive to light.  Cardiovascular:     Rate and Rhythm: Normal rate and regular rhythm.     Pulses: Normal pulses.     Heart sounds: Normal heart sounds. No murmur heard.   No friction rub. No gallop.  Pulmonary:     Effort: Pulmonary effort is normal. No respiratory distress.     Breath sounds: Normal breath sounds.  Abdominal:     General: Abdomen is flat. Bowel sounds are normal. There is no distension.     Palpations: Abdomen is soft.     Tenderness: There is abdominal tenderness.  Skin:    General: Skin is warm and dry.     Coloration: Skin is pale.  Skin is not jaundiced.  Neurological:     Mental Status: She is alert. Mental status is at baseline.     Coordination: Coordination normal.    ED Results / Procedures / Treatments   Labs (all labs ordered are listed, but only abnormal results are displayed) Labs Reviewed  CBC WITH DIFFERENTIAL/PLATELET - Abnormal; Notable for the following components:      Result Value   WBC 1.9 (*)    RBC 3.16 (*)    Hemoglobin 7.2 (*)    HCT 24.8 (*)    MCV 78.5 (*)    MCH 22.8 (*)    MCHC 29.0 (*)    Platelets 42 (*)    Neutro Abs 1.0 (*)    All other components within normal limits  COMPREHENSIVE METABOLIC PANEL - Abnormal; Notable for the following components:   Sodium 133 (*)    Glucose, Bld 299 (*)    BUN 7 (*)    Calcium 7.9 (*)    Albumin 2.4 (*)    All other components within normal limits  RESP PANEL BY RT-PCR (FLU A&B, COVID) ARPGX2  LIPASE, BLOOD  URINALYSIS, ROUTINE W REFLEX MICROSCOPIC  PATHOLOGIST SMEAR REVIEW  TYPE AND SCREEN    EKG None  Radiology CT Abdomen Pelvis W Contrast  Result Date: 05/15/2021 CLINICAL DATA:  Right lower quadrant abdominal pain, bright red blood in stools. EXAM: CT ABDOMEN AND PELVIS WITH CONTRAST TECHNIQUE: Multidetector CT imaging of the abdomen and pelvis was performed using the standard protocol following bolus administration of intravenous contrast. RADIATION DOSE REDUCTION: This exam was performed according to the departmental dose-optimization program which includes automated exposure control, adjustment of the mA and/or kV according to patient size and/or use of iterative reconstruction technique. CONTRAST:  78m OMNIPAQUE IOHEXOL 300 MG/ML  SOLN COMPARISON:  CT abdomen and pelvis dated February 03, 2019 FINDINGS: Lower chest: No acute abnormality. Hepatobiliary: Cirrhotic liver morphology. No suspicious focal liver lesions. Cholelithiasis with no gallbladder wall thickening. No biliary ductal dilation. Pancreas: Atrophic pancreas with no  evidence of main pancreatic duct dilation or surrounding inflammatory changes. Spleen: Mild splenomegaly, measuring up to 14.0 cm. Adrenals/Urinary Tract: Bilateral adrenal glands are unremarkable. Kidneys enhance symmetrically with no evidence of hydronephrosis or nephrolithiasis. Bladder is unremarkable. Stomach/Bowel: Stomach is within normal limits. Appendix is not visualized. No evidence of distention, or inflammatory changes. Mild wall thickening of the cecum and ascending colon. Vascular/Lymphatic: Aortic atherosclerosis. No enlarged abdominal or pelvic lymph nodes. Paraesophageal and splenic varices. Rectal and pararectal varices. Reproductive: Uterus and bilateral adnexa are unremarkable. Other: Trace abdominopelvic ascites.  No pneumoperitoneum. Musculoskeletal: New endplate irregularity at T11-T12 with surrounding paravertebral soft  tissue. Multilevel degenerative disc disease which is similar to prior exam. IMPRESSION: 1. New endplate irregularity at T11-T12 with surrounding paravertebral soft tissue, concerning for osteomyelitis. Recommend contrast enhanced MRI of the thoracic spine for further evaluation. 2. Rectal and pararectal varices, which could be the cause of bright red blood in stools. 3. Cirrhotic liver morphology with sequela of portal hypertension including mild splenomegaly, upper abdominal varices and trace abdominal ascites. 4. Mild wall thickening of the cecum and ascending colon, likely due to portal colopathy. 5.  Aortic Atherosclerosis (ICD10-I70.0). Electronically Signed   By: Yetta Glassman M.D.   On: 05/15/2021 10:12    Procedures Procedures    Medications Ordered in ED Medications  LORazepam (ATIVAN) injection 0.5 mg (has no administration in time range)  cefTRIAXone (ROCEPHIN) 1 g in sodium chloride 0.9 % 100 mL IVPB (has no administration in time range)  famotidine (PEPCID) IVPB 20 mg premix (20 mg Intravenous New Bag/Given 05/15/21 1011)  iohexol (OMNIPAQUE) 300  MG/ML solution 75 mL (75 mLs Intravenous Contrast Given 05/15/21 3790)    ED Course/ Medical Decision Making/ A&P                           Medical Decision Making Amount and/or Complexity of Data Reviewed Labs: ordered. Radiology: ordered.  Risk Prescription drug management. Decision regarding hospitalization.   This patient presents to the ED for concern of abdominal pain and hematochezia, this involves an extensive number of treatment options, and is a complaint that carries with it a high risk of complications and morbidity.  The differential diagnosis includes colitis, AVMs, appendicitis, other    Co morbidities that complicate the patient evaluation: polysubstance abuse, chronic Hep B, uncontrolled DMT2.    Additional history obtained: -Additional history obtained from spouse -External records from outside source obtained and reviewed including: Chart review including previous notes, labs, imaging, consultation notes -I reviewed patient visits with hepatologist from 2021 and 2022.  -Reviewed labs from 05/12/21, 04/18/21, 01/16/22, 12/11/20. Hbg downtrending from 12.7 to 8.6 to 7.2 in last 3 to 4 months.   Hemoglobin  Date Value Ref Range Status  05/15/2021 7.2 (L) 12.0 - 15.0 g/dL Final  05/13/2021 7.3 (LL) 11.1 - 15.9 g/dL Final    Comment:                      Client Requested Flag  05/08/2021 8.7 (LL) 11.1 - 15.9 g/dL Final    Comment:                      Client Requested Flag  01/16/2021 12.7 11.1 - 15.9 g/dL Final  11/09/2019 13.2 11.1 - 15.9 g/dL Final    .trend  Lab Tests: -I ordered, reviewed, and interpreted labs.  The pertinent results include:  anemia, hgb of 7.2.  Downtrending in last 3-4 months from 12.7. Thrombocytopenia at 42, also downtrending. Appears chronic secondary to liver disease. Hyperglycemic at 299, mild hypoalbum, mild hypocalcemia but K wnl. BUN wnl.     Imaging Studies ordered: -I ordered imaging studies including CT abdomen pelvis -I  independently visualized and interpreted imaging which showed endplate irregularity in the lower thoracic spine that could be consistent with osteomyelitis.  Patient does have history of chronic back pain, does not appear worse than normal.  We will order the thoracic MRI given planning to admit anyway.  There are rectal and perirectal varices which are likely the source of  the bright red blood in her stools.  Additionally there is cirrhotic liver with portal hypertension.  Thickening of the cecum and ascending colon likely due to portal colopathy.    Medicines ordered and prescription drug management: -I ordered medication including Pepcid for GIB  -Reevaluation of the patient after these medicines showed that the patient stayed the same -I have reviewed the patients home medicines and have made adjustments as needed   Consultations Obtained: I requested consultation with the [facility], hospitalist,  and discussed lab and imaging findings as well as pertinent plan - they recommend:  [facility]. HE will consult, advises clear liquid diet and medicine admit.  Hospitalist- Spoke with Dr. Lorin Mercy. Advices starting rocephin. Appreciate her consult.    ED Course: 66 year old female presenting with right lower quadrant pain and rectal bleed consistent with possible Stokes bleed.  She has been having downtrending hemoglobin for the last 3 to 4 months.  Given significant amount of bleeding, I do think patient will need to be admitted for more thorough Stokes work-up.  She is having abdominal pain, CT abdomen shows varices but no identifiable appendicitis or colitis.  Patient states she has relationship with lower [facility], I am unable to see any of this in the records.  Will consult with [facility] for their recommendations.   Cardiac Monitoring: The patient was maintained on a cardiac monitor.  I personally viewed and interpreted the cardiac monitored which showed an underlying rhythm of: NSR   Reevaluation: After the  interventions noted above, I reevaluated the patient and found that they have :stayed the same   Dispostion: Patient will need admission given worsening hemoglobin in setting of ongoing Stokes bleed likely secondary to due to varices.  Additionally, have a low suspicion for acute osteomyelitis given constant back pain, no fever, no acute leukocytosis.  However, given the abnormal finding on CT abdomen I have put in an order for MRI of thoracic spine.          Final Clinical Impression(s) / ED Diagnoses Final diagnoses:  Anemia, unspecified type    Rx / DC Orders ED Discharge Orders     None         Sherrill Raring, PA-C 05/15/21 [place], DO 05/15/21 1111

## 2021-05-15 NOTE — Assessment & Plan Note (Addendum)
-  Patient with IVDA with h/o chronic pain presenting with back pain. -CT A/P was done for GI bleeding and showed concern for discitis -MRI showed paravertebral infection/inflammation at T10-12 with phlegmons but no clear abscess -seen by IR, but cannot perform fluid aspiration due to chronic thrombocytopenia -ID following, appreciate assistance -With vertebral osteo, HIV(negative) and quatiferon gold(pending) also orderd on admission -Per ID, started on oral antibiotics and follow up scheduled

## 2021-05-15 NOTE — Assessment & Plan Note (Signed)
-  Continue ASA -Statin intolerant so on Repatha

## 2021-05-15 NOTE — Progress Notes (Signed)
Pt received from ED. VSS. Telemetry applied. Pt and family oriented to room and unit. Call light in reach.  Clyde Canterbury, RN

## 2021-05-15 NOTE — Assessment & Plan Note (Signed)
Continue Remeron 

## 2021-05-15 NOTE — Assessment & Plan Note (Addendum)
-  Thought to be due to Hep B although ETOH/drug use is also a consideration -Normal LFTs at the time of admission -INR 1.3 -GI is consulting

## 2021-05-15 NOTE — ED Triage Notes (Signed)
Pt. Stated, My Dr sent me here my hgb was around 7. Ive had bleeding in my stool for the last 6 months.

## 2021-05-15 NOTE — Assessment & Plan Note (Addendum)
-  longstanding history of thrombocytopenia/leukopenia -suspect this is related to underlying cirrhosis/portal hypertension and hypersplenism

## 2021-05-15 NOTE — Consult Note (Signed)
[place] for Infectious Disease       Reason for Consult: discitis, septic arthritis    Referring Physician: Dr. Lorin Mercy  Principal Problem:   Acute GI bleeding    aspirin EC  81 mg Oral QHS   [START ON 05/16/2021] buprenorphine-naloxone  1 tablet Sublingual BID   cloNIDine  0.1 mg Oral QID   Followed by   Derrill Memo ON 05/17/2021] cloNIDine  0.1 mg Oral BH-qamhs   Followed by   Derrill Memo ON 05/20/2021] cloNIDine  0.1 mg Oral QAC breakfast   entecavir  1 mg Oral QHS   mirtazapine  7.5 mg Oral QHS   sodium chloride flush  3 mL Intravenous Q12H    Recommendations: Hold antibiotics IR aspiration for culture   Assessment: She has MRI findings of discitis/osteomyelitis and septic arthritis.  She looks stable and will wait for IR evaluation to obtain cultures prior to starting antibiotics to greatly improve the yield.    Antibiotics: none  HPI: [facility] is a 66 y.o. female with a history of cirrhosis secondary to chronic hepatitis B, chronic back pain with worsening over the last several months and self treating with fentanyl injections comes in with symptomatic anemia.  CT of the abdomen done for the bleeding and concerning for osteomyelitis and MRI of the back c/w discitis/osteomyelitis at T11-12 and possibly on T10 and right sided septic arthritis at T11-12.  Also noted a phlegmon and canal stenosis.  She has not noted any fever or chills.  Has not been on antibiotics.  Has underlying cirrhosis with rectal varices and recently has been on entecavir by [facility].  Has chronic thrombocytopenia and leukopenia.     Review of Systems:  Constitutional: negative for fevers and chills Gastrointestinal: negative for nausea and diarrhea Integument/breast: negative for rash All other systems reviewed and are negative    Past Medical History:  Diagnosis Date   Arthritis    Bell's palsy    neuropathy    Cataract    Chronic pain    Renal disorder    Scoliosis     Toxemia in pregnancy     Social History   Tobacco Use   Smoking status: Some Days    Packs/day: 0.50    Types: Cigarettes   Smokeless tobacco: Never  Vaping Use   Vaping Use: Never used  Substance Use Topics   Alcohol use: No   Drug use: Not Currently    Comment: takes Fentanyl    Family History  Problem Relation Age of Onset   COPD Mother    Stroke Mother    Arthritis Brother        back issues   Cancer Brother        skin / back    Obesity Son     Allergies  Allergen Reactions   Gabapentin Other (See Comments)    Uncontrolled shakes / cold intolerance     Physical Exam: Constitutional: in no apparent distress  Vitals:   05/15/21 1030 05/15/21 1402  BP: (!) 114/56 (!) 104/59  Pulse: 95 90  Resp: 15 16  Temp:    SpO2: 95% 96%   EYES: anicteric ENMT: no thrush Cardiovascular: Cor RRR Respiratory: clear; GI: soft Musculoskeletal: no edema Skin: no rash Neuro: non-focal  Lab Results  Component Value Date   WBC 1.9 (L) 05/15/2021   HGB 7.2 (L) 05/15/2021   HCT 24.8 (L) 05/15/2021   MCV 78.5 (L) 05/15/2021   PLT 42 (  L) 05/15/2021    Lab Results  Component Value Date   CREATININE 0.46 05/15/2021   BUN 7 (L) 05/15/2021   NA 133 (L) 05/15/2021   K 3.8 05/15/2021   CL 103 05/15/2021   CO2 23 05/15/2021    Lab Results  Component Value Date   ALT 14 05/15/2021   AST 23 05/15/2021   ALKPHOS 104 05/15/2021     Microbiology: Recent Results (from the past 240 hour(s))  Resp Panel by RT-PCR (Flu A&B, Covid) Nasopharyngeal Swab     Status: None   Collection Time: 05/15/21 10:10 AM   Specimen: Nasopharyngeal Swab; Nasopharyngeal(NP) swabs in vial transport medium  Result Value Ref Range Status   SARS Coronavirus 2 by RT PCR NEGATIVE NEGATIVE Final    Comment: (NOTE) SARS-CoV-2 target nucleic acids are NOT DETECTED.  The SARS-CoV-2 RNA is generally detectable in upper respiratory specimens during the acute phase of infection. The  lowest concentration of SARS-CoV-2 viral copies this assay can detect is 138 copies/mL. A negative result does not preclude SARS-Cov-2 infection and should not be used as the sole basis for treatment or other patient management decisions. A negative result may occur with  improper specimen collection/handling, submission of specimen other than nasopharyngeal swab, presence of viral mutation(s) within the areas targeted by this assay, and inadequate number of viral copies(<138 copies/mL). A negative result must be combined with clinical observations, patient history, and epidemiological information. The expected result is Negative.  Fact Sheet for Patients:  EntrepreneurPulse.com.au  Fact Sheet for Healthcare Providers:  IncredibleEmployment.be  This test is no t yet approved or cleared by the [place] FDA and  has been authorized for detection and/or diagnosis of SARS-CoV-2 by FDA under an Emergency Use Authorization (EUA). This EUA will remain  in effect (meaning this test can be used) for the duration of the COVID-19 declaration under Section 564(b)(1) of the Act, 21 U.S.C.section 360bbb-3(b)(1), unless the authorization is terminated  or revoked sooner.       Influenza A by PCR NEGATIVE NEGATIVE Final   Influenza B by PCR NEGATIVE NEGATIVE Final    Comment: (NOTE) The Xpert Xpress SARS-CoV-2/FLU/RSV plus assay is intended as an aid in the diagnosis of influenza from Nasopharyngeal swab specimens and should not be used as a sole basis for treatment. Nasal washings and aspirates are unacceptable for Xpert Xpress SARS-CoV-2/FLU/RSV testing.  Fact Sheet for Patients: EntrepreneurPulse.com.au  Fact Sheet for Healthcare Providers: IncredibleEmployment.be  This test is not yet approved or cleared by the [place] FDA and has been authorized for detection and/or diagnosis of SARS-CoV-2 by FDA under  an Emergency Use Authorization (EUA). This EUA will remain in effect (meaning this test can be used) for the duration of the COVID-19 declaration under Section 564(b)(1) of the Act, 21 U.S.C. section 360bbb-3(b)(1), unless the authorization is terminated or revoked.  Performed at [facility], [place] [address] [place], [place] [address]     Addaline Peplinski W Everlynn Sagun, [place] for Infectious Disease [facility] [email or web address] 05/15/2021, 3:44 PM

## 2021-05-15 NOTE — Assessment & Plan Note (Addendum)
Resume home dose of metoprolol and lisinopril on discharge

## 2021-05-15 NOTE — Assessment & Plan Note (Addendum)
-  I have reviewed this patient in the [place] Controlled Substances Reporting System. She is not receiving controlled medications and rather is using only illicit fentanyl. -Feels that her pain is reasonably controlled with Suboxone -she has been given a short course of suboxone to take after discharge and has been referred to a pain management clinic for further treatment

## 2021-05-15 NOTE — Progress Notes (Signed)
IR received request for T11-T12 disc aspiration for osteomyelitis. Found on CT and subsequent MRI after pt presented with c/o abd pain, rectal bleeding and anemia.  Chart and imaging reviewed with Dr. Estanislado Pandy. PLTs 42k  Amenable to image guided disc aspiration but need PLTs >75K to proceed safely. Would make pt NPO p MN in case procedure can be done tomorrow. IR team will follow up in am.  Ascencion Dike PA-C Interventional Radiology 05/15/2021 3:54 PM

## 2021-05-15 NOTE — Assessment & Plan Note (Addendum)
-  Continue entecavir -she has previously followed with hepatology at [facility]

## 2021-05-15 NOTE — Assessment & Plan Note (Addendum)
-  Patient acknowledges IVDA with IV fentanyl. Review of prior records indicate that she was injecting heroin at one point -She is concerned about withdrawal -She reports a desire to quit and recognizes that ongoing use is life-threatening -She was initiated on suboxone therapy and has done very well. -She is requesting to continue the same after discharge -she has been referred to pain management clinic

## 2021-05-15 NOTE — ED Notes (Signed)
Patient transported to CT 

## 2021-05-16 DIAGNOSIS — B181 Chronic viral hepatitis B without delta-agent: Secondary | ICD-10-CM

## 2021-05-16 DIAGNOSIS — Z66 Do not resuscitate: Secondary | ICD-10-CM

## 2021-05-16 DIAGNOSIS — D62 Acute posthemorrhagic anemia: Secondary | ICD-10-CM | POA: Diagnosis present

## 2021-05-16 DIAGNOSIS — G894 Chronic pain syndrome: Secondary | ICD-10-CM

## 2021-05-16 DIAGNOSIS — R400 Somnolence: Secondary | ICD-10-CM | POA: Insufficient documentation

## 2021-05-16 LAB — GLUCOSE, CAPILLARY
Glucose-Capillary: 190 mg/dL — ABNORMAL HIGH (ref 70–99)
Glucose-Capillary: 232 mg/dL — ABNORMAL HIGH (ref 70–99)
Glucose-Capillary: 175 mg/dL — ABNORMAL HIGH (ref 70–99)
Glucose-Capillary: 166 mg/dL — ABNORMAL HIGH (ref 70–99)

## 2021-05-16 LAB — CBC
HCT: 20.8 % — ABNORMAL LOW (ref 36.0–46.0)
Hemoglobin: 6.1 g/dL — CL (ref 12.0–15.0)
MCH: 22 pg — ABNORMAL LOW (ref 26.0–34.0)
MCHC: 29.3 g/dL — ABNORMAL LOW (ref 30.0–36.0)
MCV: 75.1 fL — ABNORMAL LOW (ref 80.0–100.0)
Platelets: 36 10*3/uL — ABNORMAL LOW (ref 150–400)
RBC: 2.77 MIL/uL — ABNORMAL LOW (ref 3.87–5.11)
RDW: 15.3 % (ref 11.5–15.5)
WBC: 1.5 10*3/uL — ABNORMAL LOW (ref 4.0–10.5)
nRBC: 0 % (ref 0.0–0.2)

## 2021-05-16 LAB — BASIC METABOLIC PANEL
Anion gap: 6 (ref 5–15)
BUN: 7 mg/dL — ABNORMAL LOW (ref 8–23)
CO2: 24 mmol/L (ref 22–32)
Calcium: 8 mg/dL — ABNORMAL LOW (ref 8.9–10.3)
Chloride: 105 mmol/L (ref 98–111)
Creatinine, Ser: 0.5 mg/dL (ref 0.44–1.00)
GFR, Estimated: >60 mL/min (ref >60)
Glucose, Bld: 181 mg/dL — ABNORMAL HIGH (ref 70–99)
Potassium: 3.6 mmol/L (ref 3.5–5.1)
Sodium: 135 mmol/L (ref 135–145)

## 2021-05-16 LAB — FOLATE: Folate: 28 ng/mL (ref >5.9)

## 2021-05-16 LAB — AMMONIA: Ammonia: 61 umol/L — ABNORMAL HIGH (ref 9–35)

## 2021-05-16 LAB — PREPARE RBC (CROSSMATCH)

## 2021-05-16 LAB — HEMOGLOBIN AND HEMATOCRIT, BLOOD
HCT: 25.1 % — ABNORMAL LOW (ref 36.0–46.0)
Hemoglobin: 7.4 g/dL — ABNORMAL LOW (ref 12.0–15.0)

## 2021-05-16 LAB — VITAMIN D 25 HYDROXY (VIT D DEFICIENCY, FRACTURES): Vit D, 25-Hydroxy: 10.2 ng/mL — ABNORMAL LOW (ref 30–100)

## 2021-05-16 LAB — PROTIME-INR
INR: 1.3 — ABNORMAL HIGH (ref 0.8–1.2)
Prothrombin Time: 16 seconds — ABNORMAL HIGH (ref 11.4–15.2)

## 2021-05-16 MED ORDER — SODIUM CHLORIDE 0.9% IV SOLUTION: Freq: Once | INTRAVENOUS | Status: AC

## 2021-05-16 MED ORDER — ENSURE ENLIVE PO LIQD
237.0000 mL | Freq: Three times a day (TID) | ORAL | Status: DC
  Administered 2021-05-16 – 2021-05-19 (×4): 237 mL via ORAL

## 2021-05-16 MED ORDER — ADULT MULTIVITAMIN W/MINERALS CH
1.0000 | ORAL_TABLET | Freq: Every day | ORAL | Status: DC
  Administered 2021-05-17 – 2021-05-20 (×4): 1 via ORAL
  Filled 2021-05-16 (×4): qty 1

## 2021-05-16 NOTE — Progress Notes (Signed)
Visited with patient . Patient currently resting/Sleeping.  Will follow as needed.  Jaclynn Major, [place], [facility], Pager [phone]

## 2021-05-16 NOTE — Assessment & Plan Note (Deleted)
Noted to be somnolent today Possibly related to clonidine Check ammonia Blood sugar is not low

## 2021-05-16 NOTE — Hospital Course (Addendum)
66 y/o female with history of chronic Hep B infection, cirrhosis, DM, HTN, IVDA with fentanyl, admitted to the hospital with rectal bleeding. Found to have rectal varices on CT imaging. Also noted to have significant anemia and thrombocytopenia. She was transfused a total of 2 units PRBC. GI following. Fortunately, here bleeding was self limited and appears to have resolved. She is now having normal BMs and Hgb has beens table. She was also noted to have back pain and imaging indicating possible thoracic diskitis. ID and IR consulted. Disc aspiration was considered with IR, but unfortunately, her chronic thrombocytopenia was prohibitive. Despite platelet transfusions, she was not able to have significant increase in PLTs to >75K to safely perform disc aspiration. ID has started her on a course of antibiotics and she will follow up with them. Regarding opiate use/IVDA, patient expressed that she has been taking opiates to treat her chronic back pain. She was previously on high dose oxycodone until her previous providers stopped prescribing it. Since she could not find another provider to prescribe it, she was using illicit drugs to treat her pain. She was started on suboxone in the hospital with great pain control. She is motivated to stop using illicit drugs and was very pleased with her pain control with suboxone. She has been referred to a pain clinic for further chronic pain management. I have also discussed this plan with her PCP

## 2021-05-16 NOTE — Progress Notes (Signed)
Initial Nutrition Assessment  DOCUMENTATION CODES:   Non-severe (moderate) malnutrition in context of chronic illness  INTERVENTION:          Ensure Enlive po TID, each supplement provides 350 kcal and 20 grams of protein.  MVI with minerals daily.  Check vitamin D, zinc, vitamin B-6, thiamine, vitamin A, and folate levels as patient is at risk for deficiencies with chronic liver disease and malnutrition. Discussed with MD.   NUTRITION DIAGNOSIS:   Moderate Malnutrition related to chronic illness (cirrhosis, chronic hepatitis B) as evidenced by mild fat depletion, moderate fat depletion, mild muscle depletion, moderate muscle depletion.  GOAL:   Patient will meet greater than or equal to 90% of their needs  MONITOR:   PO intake, Supplement acceptance, Labs  REASON FOR ASSESSMENT:   Consult Assessment of nutrition requirement/status  ASSESSMENT:   66 yo female admitted with rectal bleeding d/t rectal varices, anemia, thrombocytopenia, back pain r/t discitis. PMH includes chronic Hep B infection, cirrhosis, DM, HTN, IVDA (fentanyl).  S/P transfusion (1 u PRBC) today for Hgb drop to 6.1 last night. Now up to 7.4.  Patient resting, but answered questions when asked. Husband reports that patient has been eating poorly on and off for a while. Some days she eats okay and some days she doesn't eat that much. He doesn't think she has lost any weight. Diet was held for possible procedure this morning, but procedure was cancelled d/t low platelets. Diet has been advanced to heart healthy.  Labs reviewed.  CBG: [phone]  Medications reviewed and include Novolog, Remeron. IVF: LR at 100 ml/h  Weight history reviewed. No significant weight changes noted.   Patient meets criteria for moderate malnutrition with mild-moderate depletion of muscle and subcutaneous fat mass.   NUTRITION - FOCUSED PHYSICAL EXAM:  Flowsheet Row Most Recent Value  Orbital Region Moderate depletion   Upper Arm Region Moderate depletion  Thoracic and Lumbar Region Mild depletion  Buccal Region Mild depletion  Temple Region Severe depletion  Clavicle Bone Region Moderate depletion  Clavicle and Acromion Bone Region Moderate depletion  Scapular Bone Region Mild depletion  Dorsal Hand Mild depletion  Patellar Region Mild depletion  Anterior Thigh Region Mild depletion  Posterior Calf Region Mild depletion  Edema (RD Assessment) None  Hair Reviewed  Eyes Reviewed  Mouth Reviewed  Skin Reviewed  Nails Reviewed       Diet Order:   Diet Order             Diet Heart Room service appropriate? Yes; Fluid consistency: Thin  Diet effective now                   EDUCATION NEEDS:   Not appropriate for education at this time  Skin:  Skin Assessment: Reviewed RN Assessment  Last BM:  no BM documented; per patient LBM this morning  Height:   Ht Readings from Last 1 Encounters:  05/16/21 5\' 1"  (1.549 m)    Weight:   Wt Readings from Last 1 Encounters:  05/16/21 58.7 kg     BMI:  Body mass index is 24.45 kg/m.  Estimated Nutritional Needs:   Kcal:  1900-2100  Protein:  85-95 gm  Fluid:  1.7-1.9 L    Lucas Mallow RD, LDN, CNSC Please refer to Amion for contact information.

## 2021-05-16 NOTE — Progress Notes (Signed)
Inpatient Diabetes Program Recommendations  AACE/ADA: New Consensus Statement on Inpatient Glycemic Control (2015)  Target Ranges:  Prepandial:   less than 140 mg/dL      Peak postprandial:   less than 180 mg/dL (1-2 hours)      Critically ill patients:  140 - 180 mg/dL   Lab Results  Component Value Date   GLUCAP 232 (H) 05/16/2021   HGBA1C 6.9 (H) 05/08/2021    Review of Glycemic Control  Latest Reference Range & Units 05/15/21 22:04 05/16/21 04:30 05/16/21 08:22  Glucose-Capillary 70 - 99 mg/dL 221 (H) 190 (H) 232 (H)  First dose of Novolog 5 units   Diabetes history: DM 2 Outpatient Diabetes medications: Trulicity 1.5 mg QSunday Current orders for Inpatient glycemic control:  Novolog 0-15 units tid  A1c 6.9% on 05/08/2021 Consult noted. Watch on Current regimen.  Thanks,  Tama Headings RN, MSN, BC-ADM Inpatient Diabetes Coordinator Team Pager [phone] (8a-5p)

## 2021-05-16 NOTE — Progress Notes (Addendum)
[facility] Progress Note  Georgeann Brinkman 66 y.o. 08/24/1955  CC:  Rectal varices, hematochezia, anemia   Subjective: Patient examined in bed No acute distress Received 1 unit packed red blood cells overnight. hemoglobin is 6.1 at 1:50 AM Hemoglobin at 7.4 s/p 1 unit pRBC Patient states she is uncomfortable. Bowel movements today, no rectal bleeding  ROS : Review of Systems  Constitutional:  Negative for chills and fever.  HENT:  Negative for ear pain and hearing loss.   Eyes:  Negative for blurred vision and double vision.  Respiratory:  Negative for cough and sputum production.   Cardiovascular:  Negative for chest pain and palpitations.  Gastrointestinal:  Negative for abdominal pain, blood in stool, constipation, diarrhea, heartburn, melena, nausea and vomiting.  Genitourinary:  Negative for dysuria and urgency.  Musculoskeletal:  Negative for myalgias and neck pain.  Skin:  Negative for itching and rash.  Neurological:  Negative for dizziness and headaches.  Endo/Heme/Allergies:  Negative for polydipsia. Does not bruise/bleed easily.  Psychiatric/Behavioral:  Negative for depression and suicidal ideas.      Objective: Vital signs in last 24 hours: Vitals:   05/16/21 0823 05/16/21 0844  BP: 136/81 136/81  Pulse: 85   Resp: (!) 22   Temp:    SpO2: 92%     Physical Exam:  General:  Alert, cooperative, no distress, appears stated age  Head:  Normocephalic, without obvious abnormality, atraumatic  Eyes:  Anicteric sclera, EOM's intact  Lungs:   Clear to auscultation bilaterally, respirations unlabored  Heart:  Regular rate and rhythm, S1, S2 normal  Abdomen:   Soft, non-tender, bowel sounds active all four quadrants,  no masses,   Extremities: Extremities normal, atraumatic, no  edema  Rectal: 2 moderate sized, bluish/purple hemorrhoids. No frank blood on exam.   Pulses: 2+ and symmetric    Lab Results: Recent Labs    05/15/21 0804  05/16/21 0151  NA 133* 135  K 3.8 3.6  CL 103 105  CO2 23 24  GLUCOSE 299* 181*  BUN 7* 7*  CREATININE 0.46 0.50  CALCIUM 7.9* 8.0*   Recent Labs    05/15/21 0804  AST 23  ALT 14  ALKPHOS 104  BILITOT 0.5  PROT 7.0  ALBUMIN 2.4*   Recent Labs    05/13/21 1506 05/15/21 0804 05/15/21 0804 05/16/21 0151 05/16/21 0919  WBC 1.5* 1.9*  --  1.5*  --   NEUTROABS 0.9* 1.0*  --   --   --   HGB 7.3* 7.2*   < > 6.1* 7.4*  HCT 25.2* 24.8*   < > 20.8* 25.1*  MCV 77* 78.5*  --  75.1*  --   PLT 57* 42*  --  36*  --    < > = values in this interval not displayed.   No results for input(s): LABPROT, INR in the last 72 hours.    Assessment Anemia Rectal bleeding Pancytopenia  WBC 1.5 HGB 7.4 MCV 75.1 Platelets 36 Rectal bleeding and anemia likely due to bleeding rectal varices.   Plan: 1.Monitor hemoglobin we will continue to transfuse to maintian hemoglobin greater than 7. 2.Continue hydrocortisone suppositories 3.Continue to treat constipation as needed 4.Continue to trend hemoglobin and observe how hemorrhoids respond to hydrocortisone therapy.  If rectal bleeding continues may consider colonoscopy. 5.If pancytopenia continues to persist may consider hematology consult.  [facility] will follow  Charlott Rakes PA-C 05/16/2021, 11:05 AM  Contact #  [phone]

## 2021-05-16 NOTE — Progress Notes (Signed)
Progress Note   Patient: Grace Stokes RAQ:[id number] DOB: 1955-09-17 DOA: 05/15/2021     1 DOS: the patient was seen and examined on 05/16/2021   Brief hospital course: 66 y/o female with history of chronic Hep B infection, cirrhosis, DM, HTN, IVDA with fentanyl, admitted to the hospital with rectal bleeding. Found to have rectal varices on CT imaging. Also noted to have significant anemia and thrombocytopenia. Transfusing PRBC prn. GI following. Also noted to have back pain and imaging indicating possible thoracic diskitis. ID and IR consulted. Will need aspiration of disc space, but barrier is currently thrombocytopenia. Holding antibiotics until aspirate for culture can be obtained.  Assessment and Plan: * Acute GI bleeding- (present on admission) -Patient presenting with 6 months of BRBPR; she does not have rectal bleeding when she is not having bowel movements -Imaging appears to be c/w rectal varices -She is afebrile at this time without tachycardia, and no leukocytosis; will not give antibiotics at this time.  -Continue to monitor for recurrent bleeding -GI following -defer need for endoscoping evaluation to GI    Somnolence Noted to be somnolent today Possibly related to clonidine Check ammonia Blood sugar is not low  ABLA (acute blood loss anemia)- (present on admission) Baseline hemoglobin appears to be around 12.7 on 01/16/21 She was noted to be 8.7 on 1/26 Hgb on admission 7.2 but dropped down to 6.1 today S/p 1 unit prbc Continue to follow hemoglobin to eval need for further transfusion  DNR (do not resuscitate)- (present on admission) -I have discussed code status with the patient and she would not desire resuscitation and would prefer to die a natural death should that situation arise. -She will need a gold out of facility DNR form at the time of discharge  Discitis of thoracic region- (present on admission) -Patient with IVDA with h/o chronic pain presenting  with back pain. -CT A/P was done for GI bleeding and showed concern for discitis -MRI showed paravertebral infection/inflammation at T10-12 with phlegmons but no clear abscess -seen by IR, but cannot perform fluid aspiration until platelets over 75K -Will need antibiotics for 4-6 weeks - holding for now pending biopsy -ID following, appreciate assistance -With vertebral osteo, HIV and quatiferon gold also orderd on admission -Continuing long term IV antibiotics will be challenging in this patient with chronic IVDA  IV drug abuse ([place])- (present on admission) -Patient acknowledges IVDA with IV fentanyl. Review of prior records indicate that she was injecting heroin at one point -She is concerned about withdrawal -She reports a desire to quit and recognizes that ongoing use is life-threatening -Will initiate suboxone therapy now; while this may precipitate early withdrawal, hopefully she can reach a level of withdrawal symptom relief that will enable her to remain hospitalized and complete treatment -Initiated on Suboxone therapy order set -If patient experiences precipitated withdrawal, will give small, frequent doses of buprenorphine (2 mg q 1-2h with adjunctive Clonidine 0.1 mg q8h, Zofran, and NSAIDs) until the precipitated withdrawal is overcome -Will monitor on COWS protocol (5-12 mild symptoms; 13-24 moderate symptoms; 25-36 moderately severe symptoms; >36 severe symptoms) -TOC team consult for substance abuse education/support -prn orders from the Clonidine withdrawal order set were also ordered.  CAD (coronary artery disease)- (present on admission) -Continue ASA -Statin intolerant so on Repatha   Depression- (present on admission) -Continue Remeron  Chronic pain- (present on admission) -I have reviewed this patient in the [place] Controlled Substances Reporting System. She is not receiving controlled medications and rather is using  only illicit fentanyl.  Diabetes mellitus type 2 in  nonobese (HCC) -Recent A1c was 6.9, indicating good control -hold Trulicity -Cover with moderate-scale SSI -Diabetes coordinator consulted  Other cirrhosis of liver ([place])- (present on admission) -Thought to be due to Hep B although ETOH/drug use is also a consideration -Normal LFTs at the time of admission -She is somnolent today, check ammonia -will also check INR -GI is consulting  Other pancytopenia ([place])- (present on admission) -longstanding history of thrombocytopenia/leukopenia -suspect this is related to underlying cirrhosis/portal hypertension and hypersplenism  -may need to consider platelet transfusion prior to any procedures since it does not appear that her platelet count has been >75K in over 4 years  Chronic active viral hepatitis B ([place])- (present on admission) -Continue entecavir -she has previously followed with hepatology at [facility] hypertension- (present on admission) -Hold home metoprolol and lisinopril for now since patient is on the Clonidine protocol  Smoker- (present on admission) -Encourage cessation.   -This was discussed with the patient and should be reviewed on an ongoing basis.   -Patch ordered at patient request.        Subjective: patient is somnolent and does not participate in history. Husband says she has not had further rectal bleeding today.   Physical Exam: Vitals:   05/16/21 0821 05/16/21 0823 05/16/21 0844 05/16/21 1207  BP:  136/81 136/81 136/81  Pulse:  85  85  Resp:  (!) 22  18  Temp: 98.3 F (36.8 C)   98.3 F (36.8 C)  TempSrc: Oral   Oral  SpO2:  92%    Weight:    58.7 kg  Height:    5\' 1"  (1.549 m)   General exam: somnolent, no distress Respiratory system: Clear to auscultation. Respiratory effort normal. Cardiovascular system:RRR. No murmurs, rubs, gallops. Gastrointestinal system: Abdomen is nondistended, soft and nontender. No organomegaly or masses felt. Normal bowel sounds heard. Central  nervous system:  No focal neurological deficits. Extremities: No C/C/E, +pedal pulses Skin: No rashes, lesions or ulcers Psychiatry: somnolent   Data Reviewed:  Reviewed patient CBC and chemistry panel. Ordered repeat CBC, ammonia and INR  Family Communication: discussed with husband  Disposition: Status is: Inpatient Remains inpatient appropriate because: GI bleeding and ABLA. Further work up and treatment for diskitis          Planned Discharge Destination:  to be determined based on progression     Time spent: 45 minutes  Author: Kathie Dike, MD 05/16/2021 12:46 PM  For on call review [email or web address].

## 2021-05-16 NOTE — Assessment & Plan Note (Addendum)
Baseline hemoglobin appears to be around 12.7 on 01/16/21 She was noted to be 8.7 on 1/26 Hgb on admission 7.2 but dropped down to 6.1 today She has received 2 units of PRBC thus far.  Hemoglobin this morning noted to be 8.5.

## 2021-05-16 NOTE — Consult Note (Signed)
° °  [facility] CM Inpatient Consult   05/16/2021  Talayeh Bruinsma 1955/09/01 [id number]  [name] [ACO] Patient: [employer]  Primary Care Provider:  Loman Brooklyn, FNP, [place], is an embedded provider with a Chronic Care Management team and program, and is listed for the transition of care follow up and appointments.  Patient was screened for Embedded practice service needs for chronic care management and is active in the program with nursing, social worker, and Pharmacist.  Plan: Notification sent to the [facility] and following for [facility] for post hospital needs.  Please contact for further questions,  Natividad Brood, RN BSN [facility] Liaison  [phone] business mobile phone Toll free office [phone]  Fax number: [phone] [email or web address]

## 2021-05-17 DIAGNOSIS — D61818 Other pancytopenia: Secondary | ICD-10-CM

## 2021-05-17 DIAGNOSIS — M4624 Osteomyelitis of vertebra, thoracic region: Secondary | ICD-10-CM

## 2021-05-17 DIAGNOSIS — B182 Chronic viral hepatitis C: Secondary | ICD-10-CM

## 2021-05-17 DIAGNOSIS — K7460 Unspecified cirrhosis of liver: Secondary | ICD-10-CM | POA: Diagnosis not present

## 2021-05-17 DIAGNOSIS — M4644 Discitis, unspecified, thoracic region: Secondary | ICD-10-CM | POA: Diagnosis not present

## 2021-05-17 DIAGNOSIS — E440 Moderate protein-calorie malnutrition: Secondary | ICD-10-CM | POA: Diagnosis present

## 2021-05-17 LAB — MAGNESIUM: Magnesium: 1.6 mg/dL — ABNORMAL LOW (ref 1.7–2.4)

## 2021-05-17 LAB — GLUCOSE, CAPILLARY
Glucose-Capillary: 130 mg/dL — ABNORMAL HIGH (ref 70–99)
Glucose-Capillary: 176 mg/dL — ABNORMAL HIGH (ref 70–99)
Glucose-Capillary: 196 mg/dL — ABNORMAL HIGH (ref 70–99)
Glucose-Capillary: 201 mg/dL — ABNORMAL HIGH (ref 70–99)
Glucose-Capillary: 236 mg/dL — ABNORMAL HIGH (ref 70–99)

## 2021-05-17 LAB — COMPREHENSIVE METABOLIC PANEL
ALT: 14 U/L (ref 0–44)
AST: 26 U/L (ref 15–41)
Albumin: 2.1 g/dL — ABNORMAL LOW (ref 3.5–5.0)
Alkaline Phosphatase: 75 U/L (ref 38–126)
Anion gap: 7 (ref 5–15)
BUN: 10 mg/dL (ref 8–23)
CO2: 21 mmol/L — ABNORMAL LOW (ref 22–32)
Calcium: 8.1 mg/dL — ABNORMAL LOW (ref 8.9–10.3)
Chloride: 107 mmol/L (ref 98–111)
Creatinine, Ser: 0.51 mg/dL (ref 0.44–1.00)
GFR, Estimated: >60 mL/min (ref >60)
Glucose, Bld: 191 mg/dL — ABNORMAL HIGH (ref 70–99)
Potassium: 3.2 mmol/L — ABNORMAL LOW (ref 3.5–5.1)
Sodium: 135 mmol/L (ref 135–145)
Total Bilirubin: 1.3 mg/dL — ABNORMAL HIGH (ref 0.3–1.2)
Total Protein: 6.5 g/dL (ref 6.5–8.1)

## 2021-05-17 LAB — CBC
HCT: 25.7 % — ABNORMAL LOW (ref 36.0–46.0)
Hemoglobin: 7.8 g/dL — ABNORMAL LOW (ref 12.0–15.0)
MCH: 22.3 pg — ABNORMAL LOW (ref 26.0–34.0)
MCHC: 30.4 g/dL (ref 30.0–36.0)
MCV: 73.4 fL — ABNORMAL LOW (ref 80.0–100.0)
Platelets: 36 10*3/uL — ABNORMAL LOW (ref 150–400)
RBC: 3.5 MIL/uL — ABNORMAL LOW (ref 3.87–5.11)
RDW: 15.9 % — ABNORMAL HIGH (ref 11.5–15.5)
WBC: 1.7 10*3/uL — ABNORMAL LOW (ref 4.0–10.5)
nRBC: 0 % (ref 0.0–0.2)

## 2021-05-17 LAB — PHOSPHORUS: Phosphorus: 3.3 mg/dL (ref 2.5–4.6)

## 2021-05-17 MED ORDER — POTASSIUM CHLORIDE CRYS ER 20 MEQ PO TBCR
40.0000 meq | EXTENDED_RELEASE_TABLET | ORAL | Status: AC
  Administered 2021-05-17 (×2): 40 meq via ORAL
  Filled 2021-05-17 (×2): qty 2

## 2021-05-17 MED ORDER — MAGNESIUM SULFATE 4 GM/100ML IV SOLN
4.0000 g | Freq: Once | INTRAVENOUS | Status: AC
  Administered 2021-05-17: 4 g via INTRAVENOUS
  Filled 2021-05-17: qty 100

## 2021-05-17 NOTE — Progress Notes (Addendum)
[facility] for Infectious Disease   Reason for visit: Follow up on discitis/septic arthritis  Interval History: her platelets remain low; no change in her pain; she remains afebrile, pancytopenic    Physical Exam: Constitutional:  Vitals:   05/17/21 0954 05/17/21 1100  BP: 120/67 108/60  Pulse: 76 75  Resp: 19 19  Temp:  98 F (36.7 C)  SpO2: 95% 97%   patient appears in NAD; chronically ill-appearing Respiratory: Normal respiratory effort; CTA B Cardiovascular: RRR GI: soft, nt, nd  Review of Systems: Constitutional: negative for fevers and chills Gastrointestinal: negative for nausea and diarrhea  Lab Results  Component Value Date   WBC 1.7 (L) 05/17/2021   HGB 7.8 (L) 05/17/2021   HCT 25.7 (L) 05/17/2021   MCV 73.4 (L) 05/17/2021   PLT 36 (L) 05/17/2021    Lab Results  Component Value Date   CREATININE 0.51 05/17/2021   BUN 10 05/17/2021   NA 135 05/17/2021   K 3.2 (L) 05/17/2021   CL 107 05/17/2021   CO2 21 (L) 05/17/2021    Lab Results  Component Value Date   ALT 14 05/17/2021   AST 26 05/17/2021   ALKPHOS 75 05/17/2021     Microbiology: Recent Results (from the past 240 hour(s))  Resp Panel by RT-PCR (Flu A&B, Covid) Nasopharyngeal Swab     Status: None   Collection Time: 05/15/21 10:10 AM   Specimen: Nasopharyngeal Swab; Nasopharyngeal(NP) swabs in vial transport medium  Result Value Ref Range Status   SARS Coronavirus 2 by RT PCR NEGATIVE NEGATIVE Final    Comment: (NOTE) SARS-CoV-2 target nucleic acids are NOT DETECTED.  The SARS-CoV-2 RNA is generally detectable in upper respiratory specimens during the acute phase of infection. The lowest concentration of SARS-CoV-2 viral copies this assay can detect is 138 copies/mL. A negative result does not preclude SARS-Cov-2 infection and should not be used as the sole basis for treatment or other patient management decisions. A negative result may occur with  improper specimen  collection/handling, submission of specimen other than nasopharyngeal swab, presence of viral mutation(s) within the areas targeted by this assay, and inadequate number of viral copies(<138 copies/mL). A negative result must be combined with clinical observations, patient history, and epidemiological information. The expected result is Negative.  Fact Sheet for Patients:  EntrepreneurPulse.com.au  Fact Sheet for Healthcare Providers:  IncredibleEmployment.be  This test is no t yet approved or cleared by the [place] FDA and  has been authorized for detection and/or diagnosis of SARS-CoV-2 by FDA under an Emergency Use Authorization (EUA). This EUA will remain  in effect (meaning this test can be used) for the duration of the COVID-19 declaration under Section 564(b)(1) of the Act, 21 U.S.C.section 360bbb-3(b)(1), unless the authorization is terminated  or revoked sooner.       Influenza A by PCR NEGATIVE NEGATIVE Final   Influenza B by PCR NEGATIVE NEGATIVE Final    Comment: (NOTE) The Xpert Xpress SARS-CoV-2/FLU/RSV plus assay is intended as an aid in the diagnosis of influenza from Nasopharyngeal swab specimens and should not be used as a sole basis for treatment. Nasal washings and aspirates are unacceptable for Xpert Xpress SARS-CoV-2/FLU/RSV testing.  Fact Sheet for Patients: EntrepreneurPulse.com.au  Fact Sheet for Healthcare Providers: IncredibleEmployment.be  This test is not yet approved or cleared by the [place] FDA and has been authorized for detection and/or diagnosis of SARS-CoV-2 by FDA under an Emergency Use Authorization (EUA). This EUA will remain in effect (  meaning this test can be used) for the duration of the COVID-19 declaration under Section 564(b)(1) of the Act, 21 U.S.C. section 360bbb-3(b)(1), unless the authorization is terminated or revoked.  Performed at [facility], [place] [address], [place], [place] [address]   Culture, blood (routine x 2)     Status: None (Preliminary result)   Collection Time: 05/15/21  3:30 PM   Specimen: BLOOD  Result Value Ref Range Status   Specimen Description BLOOD RIGHT ANTECUBITAL  Final   Special Requests   Final    BOTTLES DRAWN AEROBIC AND ANAEROBIC Blood Culture adequate volume   Culture   Final    NO GROWTH 2 DAYS Performed at [facility], [place] [address] [place], [place] [address]    Report Status PENDING  Incomplete  Culture, blood (routine x 2)     Status: None (Preliminary result)   Collection Time: 05/16/21  1:51 AM   Specimen: BLOOD LEFT HAND  Result Value Ref Range Status   Specimen Description BLOOD LEFT HAND  Final   Special Requests   Final    BOTTLES DRAWN AEROBIC AND ANAEROBIC Blood Culture adequate volume   Culture   Final    NO GROWTH 1 DAY Performed at [facility], [place] [address] [place], [place] [address]    Report Status PENDING  Incomplete    Impression/Plan:  1. Discitis/osteomyelitis T11-12 and possibly T10 - with her underlying cirrhosis, pancytopenia including persistently low platelets and will try for a platelet transfusion tomorrow and hopefully be able to do an aspiration on Monday.    2.  Chronic hepatitis B with cirrhosis - she has been on entecavir though has been difficult due to cost issues.  Will see if tenofovir is an option for her if ok with her hepatology team.    3.  Acute GI bleed - imaging c/w rectal varices. No current plans for endoscopy and monitoring hemoglobin per primary team.   4.  IVDA - has been self-treating pain with IV injections.  Not a good candidate for long term IV antibiotics.  Will consider dalbavancin/oritavancin and oral levaquin at discharge, depending on culture growth.

## 2021-05-17 NOTE — Progress Notes (Signed)
Pt noted to have a small amount of bright red blood per rectum while using the commode and while ambulating back to bed.

## 2021-05-17 NOTE — Progress Notes (Signed)
Grace Stokes 1:56 PM  Subjective: Patient doing about the same case discussed with her husband and questions answered and we discussed her narcotic issues at home and her bowel movements are solid but she did see a little bright red blood and has no new complaints  Objective: Vital signs stable afebrile no acute distress abdomen is soft nontender BUN and creatinine okay pancytopenia continues  Assessment: Multiple medical problems  Plan: Awaiting IR biopsy of her back as well as possible colonoscopy but probably will need a hematology consult to help get her counts up particularly a platelet count greater than 50 and will check her labs tomorrow and please call me if I could be of any help fr this weekend otherwise we will asked the hospital team to check on early next week  [facility]  office [phone] After 5PM or if no answer call [phone]

## 2021-05-17 NOTE — Progress Notes (Signed)
Progress Note   Patient: Grace Stokes OZH:[id number] DOB: [birth year] DOA: 05/15/2021     2 DOS: the patient was seen and examined on 05/17/2021   Brief hospital course: 66 y/o female with history of chronic Hep B infection, cirrhosis, DM, HTN, IVDA with fentanyl, admitted to the hospital with rectal bleeding. Found to have rectal varices on CT imaging. Also noted to have significant anemia and thrombocytopenia. Transfusing PRBC prn. GI following. Also noted to have back pain and imaging indicating possible thoracic diskitis. ID and IR consulted. Will need aspiration of disc space, but barrier is currently thrombocytopenia. Holding antibiotics until aspirate for culture can be obtained.  Assessment and Plan: * Acute GI bleeding- (present on admission) -Patient presenting with 6 months of BRBPR; she does not have rectal bleeding when she is not having bowel movements -Imaging appears to be c/w rectal varices -She is afebrile at this time without tachycardia, and no leukocytosis; will not give antibiotics at this time.  -Continue to monitor for recurrent bleeding -GI following -defer need for endoscoping evaluation to GI    ABLA (acute blood loss anemia)- (present on admission) Baseline hemoglobin appears to be around 12.7 on 01/16/21 She was noted to be 8.7 on 1/26 Hgb on admission 7.2 but dropped down to 6.1 today S/p 1 unit prbc with follow-up hemoglobin improved to 7.8 Continue to follow hemoglobin to eval need for further transfusion  DNR (do not resuscitate)- (present on admission) -I have discussed code status with the patient and she would not desire resuscitation and would prefer to die a natural death should that situation arise. -She will need a gold out of facility DNR form at the time of discharge  Discitis of thoracic region- (present on admission) -Patient with IVDA with h/o chronic pain presenting with back pain. -CT A/P was done for GI bleeding and showed concern for  discitis -MRI showed paravertebral infection/inflammation at T10-12 with phlegmons but no clear abscess -seen by IR, but cannot perform fluid aspiration until platelets over 75K -We will plan on platelet transfusion tomorrow with the hopes to increase platelet counts to a reasonable level for fluid aspiration -Will need antibiotics for 4-6 weeks - holding for now pending biopsy -ID following, appreciate assistance -With vertebral osteo, HIV and quatiferon gold also orderd on admission -Continuing long term IV antibiotics will be challenging in this patient with chronic IVDA  IV drug abuse ([place])- (present on admission) -Patient acknowledges IVDA with IV fentanyl. Review of prior records indicate that she was injecting heroin at one point -She is concerned about withdrawal -She reports a desire to quit and recognizes that ongoing use is life-threatening -Will initiate suboxone therapy now; while this may precipitate early withdrawal, hopefully she can reach a level of withdrawal symptom relief that will enable her to remain hospitalized and complete treatment -Initiated on Suboxone therapy order set -If patient experiences precipitated withdrawal, will give small, frequent doses of buprenorphine (2 mg q 1-2h with adjunctive Clonidine 0.1 mg q8h, Zofran, and NSAIDs) until the precipitated withdrawal is overcome -Will monitor on COWS protocol (5-12 mild symptoms; 13-24 moderate symptoms; 25-36 moderately severe symptoms; >36 severe symptoms) -TOC team consult for substance abuse education/support -prn orders from the Clonidine withdrawal order set were also ordered.  CAD (coronary artery disease)- (present on admission) -Continue ASA -Statin intolerant so on Repatha   Depression- (present on admission) -Continue Remeron  Chronic pain- (present on admission) -I have reviewed this patient in the [place] Controlled Substances Reporting System. She  is not receiving controlled medications and rather is  using only illicit fentanyl. -Feels that her pain is reasonably controlled with Suboxone  Diabetes mellitus type 2 in nonobese (HCC) -Recent A1c was 6.9, indicating good control -hold Trulicity -Cover with moderate-scale SSI -Diabetes coordinator consulted  Other cirrhosis of liver ([place])- (present on admission) -Thought to be due to Hep B although ETOH/drug use is also a consideration -Normal LFTs at the time of admission -Ammonia level 61, but she is more awake and alert today. -INR 1.3 -GI is consulting  Other pancytopenia ([place])- (present on admission) -longstanding history of thrombocytopenia/leukopenia -suspect this is related to underlying cirrhosis/portal hypertension and hypersplenism  -may need to consider platelet transfusion prior to any procedures since it does not appear that her platelet count has been >75K in over 4 years -We will plan on platelet transfusion tomorrow with the hopes of getting her platelet count at acceptable level for possible IR procedure on 2/6  Chronic active viral hepatitis B ([place])- (present on admission) -Continue entecavir -she has previously followed with hepatology at [facility] hypertension- (present on admission) -Hold home metoprolol and lisinopril for now since patient is on the Clonidine protocol  Smoker- (present on admission) -Encourage cessation.   -This was discussed with the patient and should be reviewed on an ongoing basis.   -Patch ordered at patient request.  Somnolence-resolved as of 05/17/2021 Noted to be somnolent today Possibly related to clonidine Check ammonia Blood sugar is not low        Subjective: she had a bowel movement today that contained some blood  Physical Exam: Vitals:   05/17/21 0954 05/17/21 1100 05/17/21 1625 05/17/21 2007  BP: 120/67 108/60 114/65 107/61  Pulse: 76 75 76 78  Resp: 19 19 19 18   Temp:  98 F (36.7 C) 98.2 F (36.8 C) 98.3 F (36.8 C)  TempSrc:  Oral Oral  Oral  SpO2: 95% 97% 96% 96%  Weight:      Height:       General exam: Alert, awake, oriented x 3 Respiratory system: Clear to auscultation. Respiratory effort normal. Cardiovascular system:RRR. No murmurs, rubs, gallops. Gastrointestinal system: Abdomen is nondistended, soft and nontender. No organomegaly or masses felt. Normal bowel sounds heard. Central nervous system: Alert and oriented. No focal neurological deficits. Extremities: No C/C/E, +pedal pulses Skin: No rashes, lesions or ulcers Psychiatry: Judgement and insight appear normal. Mood & affect appropriate.    Data Reviewed:  Reviewed CBC, INR, ammonia and chemistry, Repeat CBC and chemistry ordered for AM  Family Communication: discussed with patient  Disposition: Status is: Inpatient Remains inpatient appropriate because: further work up of discitis          Planned Discharge Destination: Home     Time spent: 35 minutes  Author: Kathie Dike, MD 05/17/2021 9:00 PM  For on call review [email or web address].

## 2021-05-18 LAB — VITAMIN B1: Vitamin B1 (Thiamine): 97.1 nmol/L (ref 66.5–200.0)

## 2021-05-18 LAB — CBC
HCT: 25.5 % — ABNORMAL LOW (ref 36.0–46.0)
Hemoglobin: 7.3 g/dL — ABNORMAL LOW (ref 12.0–15.0)
MCH: 21.6 pg — ABNORMAL LOW (ref 26.0–34.0)
MCHC: 28.6 g/dL — ABNORMAL LOW (ref 30.0–36.0)
MCV: 75.4 fL — ABNORMAL LOW (ref 80.0–100.0)
Platelets: 35 10*3/uL — ABNORMAL LOW (ref 150–400)
RBC: 3.38 MIL/uL — ABNORMAL LOW (ref 3.87–5.11)
RDW: 16.2 % — ABNORMAL HIGH (ref 11.5–15.5)
WBC: 1.6 10*3/uL — ABNORMAL LOW (ref 4.0–10.5)
nRBC: 0 % (ref 0.0–0.2)

## 2021-05-18 LAB — COMPREHENSIVE METABOLIC PANEL
ALT: 12 U/L (ref 0–44)
AST: 23 U/L (ref 15–41)
Albumin: 2 g/dL — ABNORMAL LOW (ref 3.5–5.0)
Alkaline Phosphatase: 69 U/L (ref 38–126)
Anion gap: 6 (ref 5–15)
BUN: 11 mg/dL (ref 8–23)
CO2: 22 mmol/L (ref 22–32)
Calcium: 7.7 mg/dL — ABNORMAL LOW (ref 8.9–10.3)
Chloride: 109 mmol/L (ref 98–111)
Creatinine, Ser: 0.51 mg/dL (ref 0.44–1.00)
GFR, Estimated: >60 mL/min (ref >60)
Glucose, Bld: 207 mg/dL — ABNORMAL HIGH (ref 70–99)
Potassium: 3.8 mmol/L (ref 3.5–5.1)
Sodium: 137 mmol/L (ref 135–145)
Total Bilirubin: 0.6 mg/dL (ref 0.3–1.2)
Total Protein: 6.2 g/dL — ABNORMAL LOW (ref 6.5–8.1)

## 2021-05-18 LAB — GLUCOSE, CAPILLARY
Glucose-Capillary: 204 mg/dL — ABNORMAL HIGH (ref 70–99)
Glucose-Capillary: 171 mg/dL — ABNORMAL HIGH (ref 70–99)
Glucose-Capillary: 199 mg/dL — ABNORMAL HIGH (ref 70–99)
Glucose-Capillary: 206 mg/dL — ABNORMAL HIGH (ref 70–99)
Glucose-Capillary: 146 mg/dL — ABNORMAL HIGH (ref 70–99)

## 2021-05-18 LAB — PREPARE RBC (CROSSMATCH)

## 2021-05-18 LAB — MAGNESIUM: Magnesium: 2.2 mg/dL (ref 1.7–2.4)

## 2021-05-18 LAB — BPAM PLATELET PHERESIS

## 2021-05-18 MED ORDER — FUROSEMIDE 10 MG/ML IJ SOLN
20.0000 mg | Freq: Once | INTRAMUSCULAR | Status: AC
  Administered 2021-05-18: 20 mg via INTRAVENOUS
  Filled 2021-05-18: qty 2

## 2021-05-18 MED ORDER — SODIUM CHLORIDE 0.9% IV SOLUTION: Freq: Once | INTRAVENOUS | Status: AC

## 2021-05-18 NOTE — Progress Notes (Signed)
Progress Note   Patient: Grace Stokes VWU:[id number] DOB: 07-22-1955 DOA: 05/15/2021     3 DOS: the patient was seen and examined on 05/18/2021   Brief hospital course: 66 y/o female with history of chronic Hep B infection, cirrhosis, DM, HTN, IVDA with fentanyl, admitted to the hospital with rectal bleeding. Found to have rectal varices on CT imaging. Also noted to have significant anemia and thrombocytopenia. Transfusing PRBC prn. GI following. Also noted to have back pain and imaging indicating possible thoracic diskitis. ID and IR consulted. Will need aspiration of disc space, but barrier is currently thrombocytopenia. Holding antibiotics until aspirate for culture can be obtained.  Assessment and Plan: * Acute GI bleeding- (present on admission) -Patient presenting with 6 months of BRBPR; she does not have rectal bleeding when she is not having bowel movements -Imaging appears to be c/w rectal varices -She is afebrile at this time without tachycardia, and no leukocytosis; will not give antibiotics at this time.  -Continue to monitor for recurrent bleeding -GI following -defer need for endoscopic evaluation to GI -Reports that bowel movement today did not contain any blood    Malnutrition of moderate degree- (present on admission) Nutrition consult  ABLA (acute blood loss anemia)- (present on admission) Baseline hemoglobin appears to be around 12.7 on 01/16/21 She was noted to be 8.7 on 1/26 Hgb on admission 7.2 but dropped down to 6.1 today S/p 1 unit prbc with follow-up hemoglobin improved to 7.8 This trended back down to 7.3 and she will receive another unit of PRBC today Continue to follow hemoglobin to eval need for further transfusion  DNR (do not resuscitate)- (present on admission) -CODE STATUS was discussed with the patient and she would not desire resuscitation and would prefer to die a natural death should that situation arise. -She will need a gold out of facility  DNR form at the time of discharge  Discitis of thoracic region- (present on admission) -Patient with IVDA with h/o chronic pain presenting with back pain. -CT A/P was done for GI bleeding and showed concern for discitis -MRI showed paravertebral infection/inflammation at T10-12 with phlegmons but no clear abscess -seen by IR, but cannot perform fluid aspiration until platelets over 75K -Patient will receive platelet transfusion today with the hopes to increase platelet counts to a reasonable level for fluid aspiration -Will need antibiotics for 4-6 weeks - holding for now pending biopsy -ID following, appreciate assistance -With vertebral osteo, HIV and quatiferon gold also orderd on admission -Continuing long term IV antibiotics will be challenging in this patient with chronic IVDA  IV drug abuse ([place])- (present on admission) -Patient acknowledges IVDA with IV fentanyl. Review of prior records indicate that she was injecting heroin at one point -She is concerned about withdrawal -She reports a desire to quit and recognizes that ongoing use is life-threatening -Will initiate suboxone therapy now; while this may precipitate early withdrawal, hopefully she can reach a level of withdrawal symptom relief that will enable her to remain hospitalized and complete treatment -Initiated on Suboxone therapy order set -If patient experiences precipitated withdrawal, will give small, frequent doses of buprenorphine (2 mg q 1-2h with adjunctive Clonidine 0.1 mg q8h, Zofran, and NSAIDs) until the precipitated withdrawal is overcome -Will monitor on COWS protocol (5-12 mild symptoms; 13-24 moderate symptoms; 25-36 moderately severe symptoms; >36 severe symptoms) -TOC team consult for substance abuse education/support -prn orders from the Clonidine withdrawal order set were also ordered.  CAD (coronary artery disease)- (present on admission) -Continue  ASA -Statin intolerant so on Repatha   Depression-  (present on admission) -Continue Remeron  Chronic pain- (present on admission) -I have reviewed this patient in the [place] Controlled Substances Reporting System. She is not receiving controlled medications and rather is using only illicit fentanyl. -Feels that her pain is reasonably controlled with Suboxone  Diabetes mellitus type 2 in nonobese (HCC) -Recent A1c was 6.9, indicating good control -hold Trulicity -Cover with moderate-scale SSI -Diabetes coordinator consulted  Other cirrhosis of liver ([place])- (present on admission) -Thought to be due to Hep B although ETOH/drug use is also a consideration -Normal LFTs at the time of admission -Ammonia level 61, but she is more awake and alert today. -INR 1.3 -GI is consulting  Other pancytopenia ([place])- (present on admission) -longstanding history of thrombocytopenia/leukopenia -suspect this is related to underlying cirrhosis/portal hypertension and hypersplenism  -may need to consider platelet transfusion prior to any procedures since it does not appear that her platelet count has been >75K in over 4 years -patient will receive platelet transfusion today with the hopes of getting her platelet count at acceptable level for possible IR procedure on 2/6  Chronic active viral hepatitis B ([place])- (present on admission) -Continue entecavir -she has previously followed with hepatology at [facility] hypertension- (present on admission) -Hold home metoprolol and lisinopril for now since patient is on the Clonidine protocol  Smoker- (present on admission) -Encourage cessation.   -This was discussed with the patient and should be reviewed on an ongoing basis.   -Patch ordered at patient request.        Subjective: Reports having bowel movement earlier today that did not contain any blood.  She does not have any new complaints  Physical Exam: Vitals:   05/18/21 1843 05/18/21 1850 05/18/21 1905 05/18/21 2045  BP: 137/80   138/83 128/77  Pulse: 82  80 79  Resp: 18 18 19 20   Temp: 98.3 F (36.8 C)  98.2 F (36.8 C) 98.3 F (36.8 C)  TempSrc: Oral  Oral Oral  SpO2: 98%  95% 95%  Weight:      Height:      General exam: Alert, awake, oriented x 3 Respiratory system: Clear to auscultation. Respiratory effort normal. Cardiovascular system:RRR. No murmurs, rubs, gallops. Gastrointestinal system: Abdomen is nondistended, soft and nontender. No organomegaly or masses felt. Normal bowel sounds heard. Central nervous system: Alert and oriented. No focal neurological deficits. Extremities: No C/C/E, +pedal pulses Skin: No rashes, lesions or ulcers Psychiatry: Judgement and insight appear normal. Mood & affect appropriate.     Data Reviewed:  CBC and chemistry reviewed.  Repeat CBC and chemistry ordered for tomorrow  Family Communication: Discussed with patient  Disposition: Status is: Inpatient Remains inpatient appropriate because: Needs continued work-up for discitis and evaluation for GI bleeding          Planned Discharge Destination: Home     Time spent: 35 minutes  Author: Kathie Dike, MD 05/18/2021 9:32 PM  For on call review [email or web address].

## 2021-05-18 NOTE — Assessment & Plan Note (Signed)
Nutrition consult

## 2021-05-18 NOTE — Progress Notes (Signed)
IR follow up PLTs still 35k today  As per Dr. Blythe Stanford note, agree with plan for PLT transfusion today. Assess PLT count tomorrow am before possibly proceeding with disc aspiration. Would keep NPO p MN in case procedure can be done tomorrow. IR will cont to follow.  Ascencion Dike PA-C Interventional Radiology 05/18/2021 8:25 AM

## 2021-05-19 ENCOUNTER — Ambulatory Visit (INDEPENDENT_AMBULATORY_CARE_PROVIDER_SITE_OTHER): Payer: Medicare HMO

## 2021-05-19 DIAGNOSIS — E1165 Type 2 diabetes mellitus with hyperglycemia: Secondary | ICD-10-CM

## 2021-05-19 DIAGNOSIS — K7469 Other cirrhosis of liver: Secondary | ICD-10-CM

## 2021-05-19 DIAGNOSIS — I10 Essential (primary) hypertension: Secondary | ICD-10-CM

## 2021-05-19 DIAGNOSIS — F331 Major depressive disorder, recurrent, moderate: Secondary | ICD-10-CM

## 2021-05-19 DIAGNOSIS — K922 Gastrointestinal hemorrhage, unspecified: Secondary | ICD-10-CM | POA: Diagnosis not present

## 2021-05-19 DIAGNOSIS — E782 Mixed hyperlipidemia: Secondary | ICD-10-CM

## 2021-05-19 DIAGNOSIS — D61818 Other pancytopenia: Secondary | ICD-10-CM

## 2021-05-19 LAB — COMPREHENSIVE METABOLIC PANEL
ALT: 14 U/L (ref 0–44)
AST: 24 U/L (ref 15–41)
Albumin: 2.2 g/dL — ABNORMAL LOW (ref 3.5–5.0)
Alkaline Phosphatase: 69 U/L (ref 38–126)
Anion gap: 7 (ref 5–15)
BUN: 7 mg/dL — ABNORMAL LOW (ref 8–23)
CO2: 24 mmol/L (ref 22–32)
Calcium: 8 mg/dL — ABNORMAL LOW (ref 8.9–10.3)
Chloride: 107 mmol/L (ref 98–111)
Creatinine, Ser: 0.53 mg/dL (ref 0.44–1.00)
GFR, Estimated: >60 mL/min (ref >60)
Glucose, Bld: 117 mg/dL — ABNORMAL HIGH (ref 70–99)
Potassium: 3.4 mmol/L — ABNORMAL LOW (ref 3.5–5.1)
Sodium: 138 mmol/L (ref 135–145)
Total Bilirubin: 0.9 mg/dL (ref 0.3–1.2)
Total Protein: 6.4 g/dL — ABNORMAL LOW (ref 6.5–8.1)

## 2021-05-19 LAB — BPAM RBC
Blood Product Expiration Date: 202302252359
Blood Product Expiration Date: 202303062359
ISSUE DATE / TIME: 202302030335
ISSUE DATE / TIME: 202302051120
Unit Type and Rh: 5100
Unit Type and Rh: 5100

## 2021-05-19 LAB — TYPE AND SCREEN
ABO/RH(D): O POS
Antibody Screen: NEGATIVE
Unit division: 0
Unit division: 0

## 2021-05-19 LAB — GLUCOSE, CAPILLARY
Glucose-Capillary: 118 mg/dL — ABNORMAL HIGH (ref 70–99)
Glucose-Capillary: 122 mg/dL — ABNORMAL HIGH (ref 70–99)
Glucose-Capillary: 127 mg/dL — ABNORMAL HIGH (ref 70–99)
Glucose-Capillary: 134 mg/dL — ABNORMAL HIGH (ref 70–99)
Glucose-Capillary: 158 mg/dL — ABNORMAL HIGH (ref 70–99)
Glucose-Capillary: 201 mg/dL — ABNORMAL HIGH (ref 70–99)

## 2021-05-19 LAB — CBC
HCT: 28.6 % — ABNORMAL LOW (ref 36.0–46.0)
Hemoglobin: 8.5 g/dL — ABNORMAL LOW (ref 12.0–15.0)
MCH: 23 pg — ABNORMAL LOW (ref 26.0–34.0)
MCHC: 29.7 g/dL — ABNORMAL LOW (ref 30.0–36.0)
MCV: 77.5 fL — ABNORMAL LOW (ref 80.0–100.0)
Platelets: 43 10*3/uL — ABNORMAL LOW (ref 150–400)
RBC: 3.69 MIL/uL — ABNORMAL LOW (ref 3.87–5.11)
RDW: 18.5 % — ABNORMAL HIGH (ref 11.5–15.5)
WBC: 1.6 10*3/uL — ABNORMAL LOW (ref 4.0–10.5)
nRBC: 0 % (ref 0.0–0.2)

## 2021-05-19 LAB — ZINC: Zinc: 51 ug/dL (ref 44–115)

## 2021-05-19 LAB — VITAMIN B6: Vitamin B6: 2.3 ug/L — ABNORMAL LOW (ref 3.4–65.2)

## 2021-05-19 LAB — PATHOLOGIST SMEAR REVIEW

## 2021-05-19 MED ORDER — POTASSIUM CHLORIDE CRYS ER 20 MEQ PO TBCR
40.0000 meq | EXTENDED_RELEASE_TABLET | Freq: Once | ORAL | Status: AC
  Administered 2021-05-19: 40 meq via ORAL
  Filled 2021-05-19: qty 2

## 2021-05-19 MED ORDER — VITAMIN D (ERGOCALCIFEROL) 1.25 MG (50000 UNIT) PO CAPS
50000.0000 [IU] | ORAL_CAPSULE | ORAL | Status: DC
  Administered 2021-05-19: 50000 [IU] via ORAL
  Filled 2021-05-19: qty 1

## 2021-05-19 NOTE — Chronic Care Management (AMB) (Signed)
Chronic Care Management    Clinical Social Work Note  05/19/2021 Name: Grace Stokes MRN: [id number] DOB: [birth year]  Grace Stokes is a 66 y.o. year old female who is a primary care patient of Grace Brooklyn, FNP. The CCM team was consulted to assist the patient with chronic disease management and/or care coordination needs related to: [employer] .   Engaged with patient / spouse of patient, Grace Stokes, by telephone for follow up visit in response to provider referral for social work chronic care management and care coordination services.   Consent to Services:  The patient was given information about Chronic Care Management services, agreed to services, and gave verbal consent prior to initiation of services.  Please see initial visit note for detailed documentation.   Patient agreed to services and consent obtained.   Assessment: Review of patient past medical history, allergies, medications, and health status, including review of relevant consultants reports was performed today as part of a comprehensive evaluation and provision of chronic care management and care coordination services.     SDOH (Social Determinants of Health) assessments and interventions performed:  SDOH Interventions    Flowsheet Row Most Recent Value  SDOH Interventions   Stress Interventions Other (Comment)  [client has stress related to managing pain issues. Client is currently at [facility] receiving care related to [facility] use of client]  Depression Interventions/Treatment  Currently on Treatment        Advanced Directives Status: See Vynca application for related entries.  CCM Care Plan  Allergies  Allergen Reactions   Gabapentin Other (See Comments)    Uncontrolled shakes / cold intolerance     Facility-Administered Encounter Medications as of 05/19/2021  Medication   acetaminophen (TYLENOL) tablet 650 mg   Or   acetaminophen (TYLENOL) suppository 650 mg    buprenorphine-naloxone (SUBOXONE) 8-2 mg per SL tablet 1 tablet   [START ON 05/20/2021] cloNIDine (CATAPRES) tablet 0.1 mg   dicyclomine (BENTYL) tablet 20 mg   feeding supplement (ENSURE ENLIVE / ENSURE PLUS) liquid 237 mL   hydrALAZINE (APRESOLINE) injection 5 mg   hydrocortisone (ANUSOL-HC) suppository 25 mg   hydrOXYzine (ATARAX) tablet 25 mg   insulin aspart (novoLOG) injection 0-15 Units   loperamide (IMODIUM) capsule 2-4 mg   methocarbamol (ROBAXIN) tablet 500 mg   mirtazapine (REMERON) tablet 7.5 mg   morphine (PF) 2 MG/ML injection 2 mg   multivitamin with minerals tablet 1 tablet   ondansetron (ZOFRAN) tablet 4 mg   Or   ondansetron (ZOFRAN) injection 4 mg   potassium chloride SA (KLOR-CON M) CR tablet 40 mEq   sodium chloride flush (NS) 0.9 % injection 3 mL   Vitamin D (Ergocalciferol) (DRISDOL) capsule 50,000 Units   Outpatient Encounter Medications as of 05/19/2021  Medication Sig Note   aspirin 81 MG EC tablet Take 1 tablet (81 mg total) by mouth daily. Swallow whole. (Patient taking differently: Take 81 mg by mouth at bedtime. Swallow whole.)    blood glucose meter kit and supplies KIT Dispense based on patient and insurance preference. Use up to four times daily as directed.    Dulaglutide (TRULICITY) 1.5 QP/6.1PJ SOPN Inject 1.5 mg into the skin once a week. (Patient taking differently: Inject 1.5 mg into the skin every Sunday.) 03/21/2021: VIA [facility] PATIENT ASSISTANCE   entecavir (BARACLUDE) 1 MG tablet Take 1 mg by mouth at bedtime. 05/15/2021: Filled at mail order pharmacy per pt (unknown name)   Evolocumab (REPATHA SURECLICK) 093 MG/ML  SOAJ Inject 140 mg into the skin every 14 (fourteen) days. (Patient taking differently: Inject 140 mg into the skin every 14 (fourteen) days. Every other Monday) 05/15/2021: New medication - one dose 05/12/21   Hydroactive Dressings (TEGADERM HYDROCOLLOID THIN) MISC Apply 1 each topically every other day. (Patient taking differently: Apply 1  each topically See admin instructions. Apply topically to sore on buttocks every other day)    lisinopril (ZESTRIL) 20 MG tablet Take 1 tablet (20 mg total) by mouth daily. (Patient taking differently: Take 20 mg by mouth at bedtime.)    metoprolol succinate (TOPROL-XL) 100 MG 24 hr tablet Take 1 tablet (100 mg total) by mouth daily. (Patient taking differently: Take 100 mg by mouth at bedtime.)    mirtazapine (REMERON) 7.5 MG tablet Take 1 tablet (7.5 mg total) by mouth at bedtime.     Patient Active Problem List   Diagnosis Date Noted   Malnutrition of moderate degree 05/17/2021   ABLA (acute blood loss anemia) 05/16/2021   Acute GI bleeding 05/15/2021   Diabetes mellitus type 2 in nonobese ([place]) 05/15/2021   Chronic pain 05/15/2021   Depression 05/15/2021   CAD (coronary artery disease) 05/15/2021   IV drug abuse ([place]) 05/15/2021   Discitis of thoracic region 05/15/2021   DNR (do not resuscitate) 05/15/2021   Pressure injury of left buttock, stage 2 (Grace Stokes) 05/11/2021   Aortic atherosclerosis ([place]) 02/18/2021   Moderate episode of recurrent major depressive disorder ([place]) 02/18/2021   Tachycardia 02/18/2021   Statins contraindicated 01/16/2021   Other cirrhosis of liver ([place]) 10/23/2020   History of drug abuse ([place]) 10/23/2020   Mixed hyperlipidemia 04/18/2019   Other pancytopenia ([place]) 03/02/2019   Thrombocytopenia ([place])    Insomnia due to medical condition 09/22/2018   Chronic active viral hepatitis B ([place]) 01/26/2017   Uncontrolled type 2 diabetes mellitus with hyperglycemia ([place]) 01/13/2017   Essential hypertension 12/17/2016   Heart murmur, systolic 11/65/7903   Smoker 08/26/2016   Idiopathic scoliosis 01/28/2016   Cervical spondylosis with myelopathy 07/08/2013    Conditions to be addressed/monitored: monitor client management of anxiety issues and depression issues  Care Plan : LCSW Care Plan  Updates made by Grace Cabal, LCSW since 05/19/2021 12:00 AM      Problem: Emotional Distress      Goal: Emotional Health Supported;manage stress issues; manage depression issues   Start Date: 05/19/2021  Expected End Date: 08/14/2021  This Visit's Progress: Not on track  Recent Progress: On track  Priority: High  Note:   Current Barriers:  Chronic Mental Health needs related to depression and stress issues Mobility issues Pain issues Challenges in managing Diabetes Suicidal Ideation/Homicidal Ideation: No  Clinical Social Work Goal(s):  patient will work with SW monthly by telephone or in person to reduce or manage symptoms related to depression issues LCSW will communicate with client in next 30 days to discuss anxiety and stress issues faced by client LCSW will talk with client in next 30 days to discuss financial challenges of client Client to call RNCM as needed in next 30 days to discuss nursing needs of client  Interventions: 1:1 collaboration with Grace Brooklyn, FNP regarding development and update of comprehensive plan of care as evidenced by provider attestation and co-signature Discussed client needs with Grace Stokes, , spouse of client Discussed pain issues of client. Grace Stokes said client has pain in her back and in her legs.       Epic note gives information that client was  self injecting Fentynal in her arms. She agreed to go to ER for treatment. She has been receiving care at [facility] in [place], [place].  She has history of drug abuse.  LCSW talked with Grace Stokes , her spouse, about this issue.  He just said that client hopes to return home when discharged from hospital and client wants to not use Fentynal. Discussed family support for client. Grace Stokes said that client has reduced family support in the area. He does provide support for client. Collaborated today with RNCM Grace Stokes about client needs and current status. Discussed medication procurement of client with Grace Stokes. Discussed transport needs. Grace Stokes said Brin drives car as  needed to appointments and to complete errands  Patient Self Care Activities:  Self administers medications as prescribed Attends all scheduled provider appointments Performs ADL's independently Performs IADL's independently  Patient Coping Strengths:  Family Friends  Patient Self Care Deficits:  Mobility issues Pain issues Academic librarian  Patient Goals:  - spend time or talk with others at least 2 to 3 times per week - practice relaxation or meditation daily - keep a calendar with appointment dates  Follow Up Plan:  LCSW to call client or Grace Stokes, spouse of client, on 07/10/21 at 2:00 PM to assess client needs.     Norva Riffle.Daisi Kentner MSW, [place] [employer] representative [facility] Care Management [phone]

## 2021-05-19 NOTE — Care Management Important Message (Signed)
Important Message  Patient Details  Name: Grace Stokes MRN: [id number] Date of Birth: Aug 08, 1955   Medicare Important Message Given:  Yes     Shelda Altes 05/19/2021, 10:03 AM

## 2021-05-19 NOTE — Progress Notes (Signed)
Nutrition Follow-up  DOCUMENTATION CODES:  Non-severe (moderate) malnutrition in context of chronic illness  INTERVENTION:  Continue Ensure TID.  Continue MVI with minerals daily.  Continue to encourage PO and supplement intake.  Recommend Vitamin D supplementation given low laboratory value.  NUTRITION DIAGNOSIS:  Moderate Malnutrition related to chronic illness (cirrhosis, chronic hepatitis B) as evidenced by mild fat depletion, moderate fat depletion, mild muscle depletion, moderate muscle depletion. - ongoing  GOAL:  Patient will meet greater than or equal to 90% of their needs. - progressing  MONITOR:  PO intake, Supplement acceptance, Labs  REASON FOR ASSESSMENT:  Consult Assessment of nutrition requirement/status  ASSESSMENT:  66 yo female admitted with rectal bleeding d/t rectal varices, anemia, thrombocytopenia, back pain r/t discitis. PMH includes chronic Hep B infection, cirrhosis, DM, HTN, IVDA (fentanyl).  Per Epic, pt with limited PO but diet as been changing multiple times over the past few days. Mostly 0% PO with some scattered 25% over the past 8 meals.  Messaged MD regarding supplementation for Vitamin D repletion.  Supplements: Ensure TID  Medications: reviewed; SSI, Remeron, MVI with minerals  Labs: reviewed; K 3.4 (L), CBG 118-205 (H) Vitamin D: 10.20 (L) Vitamin B1: 97 (WNL) Vitamin B12: 431 (WNL)  Diet Order:   Diet Order             Diet heart healthy/carb modified Room service appropriate? Yes; Fluid consistency: Thin  Diet effective now                  EDUCATION NEEDS:  Not appropriate for education at this time  Skin:  Skin Assessment: Reviewed RN Assessment  Last BM:  05/17/21  Height:  Ht Readings from Last 1 Encounters:  05/16/21 5\' 1"  (1.549 m)   Weight:  Wt Readings from Last 1 Encounters:  05/16/21 58.7 kg   BMI:  Body mass index is 24.45 kg/m.  Estimated Nutritional Needs:  Kcal:  1900-2100 Protein:  85-95  gm Fluid:  1.7-1.9 L  Derrel Nip, RD, LDN (she/her/hers) Clinical Inpatient Dietitian RD Pager/After-Hours/Weekend Pager # in [place]

## 2021-05-19 NOTE — Progress Notes (Signed)
[facility]  Date of Admission:  05/15/2021     Total days of antibiotics 1         ASSESSMENT:  Ms. Angst platelets level remains <75 at 39 today and receiving transfusion while awaiting aspiration for probable T11-T12 discitis/osteomyelitis. Clinically remains stable to monitor without antibiotics to optimize growth on cultures if able to be obtained. Blood cultures remain without growth to date. Hopefully platelet levels will improve with today's transfusion. Remaining medical and supportive care per primary team.   PLAN:  Continue to monitor off antibiotics to optimize potential culture growth Monitor platelet level to proceed with IR aspiration when appropriate.  Remaining medical and supportive care per primary team.   Principal Problem:   Acute GI bleeding Active Problems:   Smoker   Essential hypertension   Chronic active viral hepatitis B (HCC)   Other pancytopenia (HCC)   Other cirrhosis of liver (HCC)   Diabetes mellitus type 2 in nonobese (HCC)   Chronic pain   Depression   CAD (coronary artery disease)   IV drug abuse ([place])   Discitis of thoracic region   DNR (do not resuscitate)   ABLA (acute blood loss anemia)   Malnutrition of moderate degree    buprenorphine-naloxone  1 tablet Sublingual BID   [START ON 05/20/2021] cloNIDine  0.1 mg Oral QAC breakfast   feeding supplement  237 mL Oral TID BM   hydrocortisone  25 mg Rectal BID   insulin aspart  0-15 Units Subcutaneous TID WC   mirtazapine  7.5 mg Oral QHS   multivitamin with minerals  1 tablet Oral Daily   sodium chloride flush  3 mL Intravenous Q12H    SUBJECTIVE:  Afebrile overnight with no acute events. Continues to have back pain. Platelet level 43 today and receiving platelets.   Allergies  Allergen Reactions   Gabapentin Other (See Comments)    Uncontrolled shakes / cold intolerance      Review of Systems: Review of Systems  Constitutional:  Negative for  chills, fever and weight loss.  Respiratory:  Negative for cough, shortness of breath and wheezing.   Cardiovascular:  Negative for chest pain and leg swelling.  Gastrointestinal:  Negative for abdominal pain, constipation, diarrhea, nausea and vomiting.  Musculoskeletal:  Positive for back pain.  Skin:  Negative for rash.     OBJECTIVE: Vitals:   05/19/21 0200 05/19/21 0807 05/19/21 1100 05/19/21 1138  BP: 126/72 138/80 133/73 125/66  Pulse: 72  81 77  Resp: 16 18 16 13   Temp: 98.2 F (36.8 C) 97.8 F (36.6 C) 97.9 F (36.6 C) 97.9 F (36.6 C)  TempSrc: Oral Oral Oral Oral  SpO2: 95%  96% 95%  Weight:      Height:       Body mass index is 24.45 kg/m.  Physical Exam Constitutional:      General: She is not in acute distress.    Appearance: She is well-developed.     Comments: Lying in bed with head of bed elevated; pleasant.   Cardiovascular:     Rate and Rhythm: Normal rate and regular rhythm.     Heart sounds: Normal heart sounds.  Pulmonary:     Effort: Pulmonary effort is normal.     Breath sounds: Normal breath sounds.  Skin:    General: Skin is warm and dry.  Neurological:     Mental Status: She is alert and oriented to person, place, and time.  Psychiatric:  Behavior: Behavior normal.        Thought Content: Thought content normal.        Judgment: Judgment normal.    Lab Results Lab Results  Component Value Date   WBC 1.6 (L) 05/19/2021   HGB 8.5 (L) 05/19/2021   HCT 28.6 (L) 05/19/2021   MCV 77.5 (L) 05/19/2021   PLT 43 (L) 05/19/2021    Lab Results  Component Value Date   CREATININE 0.53 05/19/2021   BUN 7 (L) 05/19/2021   NA 138 05/19/2021   K 3.4 (L) 05/19/2021   CL 107 05/19/2021   CO2 24 05/19/2021    Lab Results  Component Value Date   ALT 14 05/19/2021   AST 24 05/19/2021   ALKPHOS 69 05/19/2021   BILITOT 0.9 05/19/2021     Microbiology: Recent Results (from the past 240 hour(s))  Resp Panel by RT-PCR (Flu A&B, Covid)  Nasopharyngeal Swab     Status: None   Collection Time: 05/15/21 10:10 AM   Specimen: Nasopharyngeal Swab; Nasopharyngeal(NP) swabs in vial transport medium  Result Value Ref Range Status   SARS Coronavirus 2 by RT PCR NEGATIVE NEGATIVE Final    Comment: (NOTE) SARS-CoV-2 target nucleic acids are NOT DETECTED.  The SARS-CoV-2 RNA is generally detectable in upper respiratory specimens during the acute phase of infection. The lowest concentration of SARS-CoV-2 viral copies this assay can detect is 138 copies/mL. A negative result does not preclude SARS-Cov-2 infection and should not be used as the sole basis for treatment or other patient management decisions. A negative result may occur with  improper specimen collection/handling, submission of specimen other than nasopharyngeal swab, presence of viral mutation(s) within the areas targeted by this assay, and inadequate number of viral copies(<138 copies/mL). A negative result must be combined with clinical observations, patient history, and epidemiological information. The expected result is Negative.  Fact Sheet for Patients:  EntrepreneurPulse.com.au  Fact Sheet for Healthcare Providers:  IncredibleEmployment.be  This test is no t yet approved or cleared by the [place] FDA and  has been authorized for detection and/or diagnosis of SARS-CoV-2 by FDA under an Emergency Use Authorization (EUA). This EUA will remain  in effect (meaning this test can be used) for the duration of the COVID-19 declaration under Section 564(b)(1) of the Act, 21 U.S.C.section 360bbb-3(b)(1), unless the authorization is terminated  or revoked sooner.       Influenza A by PCR NEGATIVE NEGATIVE Final   Influenza B by PCR NEGATIVE NEGATIVE Final    Comment: (NOTE) The Xpert Xpress SARS-CoV-2/FLU/RSV plus assay is intended as an aid in the diagnosis of influenza from Nasopharyngeal swab specimens and should not be  used as a sole basis for treatment. Nasal washings and aspirates are unacceptable for Xpert Xpress SARS-CoV-2/FLU/RSV testing.  Fact Sheet for Patients: EntrepreneurPulse.com.au  Fact Sheet for Healthcare Providers: IncredibleEmployment.be  This test is not yet approved or cleared by the [place] FDA and has been authorized for detection and/or diagnosis of SARS-CoV-2 by FDA under an Emergency Use Authorization (EUA). This EUA will remain in effect (meaning this test can be used) for the duration of the COVID-19 declaration under Section 564(b)(1) of the Act, 21 U.S.C. section 360bbb-3(b)(1), unless the authorization is terminated or revoked.  Performed at [facility], [place] [address] [place], [place] [address]   Culture, blood (routine x 2)     Status: None (Preliminary result)   Collection Time: 05/15/21  3:30 PM   Specimen: BLOOD  Result Value Ref Range Status   Specimen Description BLOOD RIGHT ANTECUBITAL  Final   Special Requests   Final    BOTTLES DRAWN AEROBIC AND ANAEROBIC Blood Culture adequate volume   Culture   Final    NO GROWTH 4 DAYS Performed at [facility], [address], [place], [place] [address]    Report Status PENDING  Incomplete  Culture, blood (routine x 2)     Status: None (Preliminary result)   Collection Time: 05/16/21  1:51 AM   Specimen: BLOOD LEFT HAND  Result Value Ref Range Status   Specimen Description BLOOD LEFT HAND  Final   Special Requests   Final    BOTTLES DRAWN AEROBIC AND ANAEROBIC Blood Culture adequate volume   Culture   Final    NO GROWTH 3 DAYS Performed at [facility], [place] [address] [place],  [address]    Report Status PENDING  Incomplete     Terri Piedra, [facility]  05/19/2021  12:26 PM

## 2021-05-19 NOTE — Progress Notes (Signed)
° °  This pt is on IR Radar for disc aspiration Plts at 43 today Need to be 75 per Dr Estanislado Pandy  We will continue to watch labs and chart   I will see pt today

## 2021-05-19 NOTE — Progress Notes (Signed)
[facility] Progress Note  Grace Stokes 66 y.o. 1955/11/05   Subjective: Reports 2 BMs overnight without bleeding. Denies abdominal pain. Husband in room.  Objective: Vital signs: Vitals:   05/19/21 0200 05/19/21 0807  BP: 126/72 138/80  Pulse: 72   Resp: 16 18  Temp: 98.2 F (36.8 C) 97.8 F (36.6 C)  SpO2: 95%     Physical Exam: Gen: elderly, lethargic, thin, no acute distress  HEENT: anicteric sclera CV: RRR Chest: CTA B Abd: minimal diffuse tenderness with guarding, soft, nondistended, +BS Ext: no edema  Lab Results: Recent Labs    05/17/21 0201 05/18/21 0215 05/19/21 0611  NA 135 137 138  K 3.2* 3.8 3.4*  CL 107 109 107  CO2 21* 22 24  GLUCOSE 191* 207* 117*  BUN 10 11 7*  CREATININE 0.51 0.51 0.53  CALCIUM 8.1* 7.7* 8.0*  MG 1.6* 2.2  --   PHOS 3.3  --   --    Recent Labs    05/18/21 0215 05/19/21 0611  AST 23 24  ALT 12 14  ALKPHOS 69 69  BILITOT 0.6 0.9  PROT 6.2* 6.4*  ALBUMIN 2.0* 2.2*   Recent Labs    05/18/21 0215 05/19/21 0611  WBC 1.6* 1.6*  HGB 7.3* 8.5*  HCT 25.5* 28.6*  MCV 75.4* 77.5*  PLT 35* 43*      Assessment/Plan: Cirrhosis with pancytopenia awaiting IR bx of back when platelets high enough. No further GI bleeding. Hold off inpt colonoscopy. Hgb increased to 8.5 following 1 U PRBCs. Diet recs per primary team since IR bx planned pending increase in plts. [facility] will sign off. Call if questions.   Grace Stokes 05/19/2021, 11:02 AM  Questions please call [phone] Patient ID: Grace Stokes, female   DOB: 11-04-1955, 66 y.o.   MRN: [id number]

## 2021-05-19 NOTE — Consult Note (Signed)
Chief Complaint: Patient was seen in consultation today for Thoracic 11-12 disc aspiration Chief Complaint  Patient presents with   Abdominal Pain   Rectal Bleeding   Abnormal Lab   at the request of Dr Roderic Palau; Dr Linus Salmons  Supervising Physician: Luanne Bras  Patient Status: [facility] - In-pt  History of Present Illness: Grace Stokes is a 66 y.o. female   Admitted 05/15/21 for rectal bleeding Plt 43 this am Transfusing again now Also complaining of back pain Known IVDU  Thoracic MRI 05/15/21:  IMPRESSION: 1. Findings consistent with discitis/osteomyelitis at T11-T12 and probable early infection at T10. Findings are also suspicious for right septic arthritis at T11-T12. 2. Thin dorsal and ventral epidural phlegmon centered at T11-T12 without evidence of organized epidural abscess. This results in mild spinal canal stenosis without cord compression. 3. Right prevertebral/paravertebral phlegmon extending to the right neural foramina at T10-T11 through T12-L1 without convincing evidence of abscess formation.   MD/ID requesting T11-12 disc aspiration Images reviewed with Dr Charlett Lango procedure --- when plts over 44    Past Medical History:  Diagnosis Date   Arthritis    Bell's palsy    neuropathy    CAD (coronary artery disease)    Cataract    Chronic pain    Cirrhosis ([place])    Depression    Diabetes mellitus type 2 in nonobese ([place])    Hypertension    IV drug abuse ([place])    Scoliosis     Past Surgical History:  Procedure Laterality Date   ANTERIOR CERVICAL DECOMP/DISCECTOMY FUSION N/A 07/09/2013   Procedure: ANTERIOR CERVICAL DECOMPRESSION/DISCECTOMY FUSION 2 LEVELS;  Surgeon: Consuella Lose, MD;  Location: [facility];  Service: Neurosurgery;  Laterality: N/A;  Anterior Cervical Fusion Cervical five-six, six-seven.   BACK SURGERY     CARPAL TUNNEL RELEASE     CESAREAN SECTION     ELBOW SURGERY Left    I & D EXTREMITY Left 12/17/2016    Procedure: IRRIGATION AND DEBRIDEMENT LEFT HAND;  Surgeon: Iran Planas, MD;  Location: [place];  Service: Orthopedics;  Laterality: Left;   INCISION / DRAINAGE HAND / FINGER Left 12/17/2016   ROTATOR CUFF REPAIR     SHOULDER SURGERY     TUBAL LIGATION      Allergies: Gabapentin  Medications: Prior to Admission medications   Medication Sig Start Date End Date Taking? Authorizing Provider  aspirin 81 MG EC tablet Take 1 tablet (81 mg total) by mouth daily. Swallow whole. Patient taking differently: Take 81 mg by mouth at bedtime. Swallow whole. 02/18/21  Yes Hendricks Limes F, FNP  Dulaglutide (TRULICITY) 1.5 GX/2.1JH SOPN Inject 1.5 mg into the skin once a week. Patient taking differently: Inject 1.5 mg into the skin every Sunday. 02/18/21  Yes Loman Brooklyn, FNP  entecavir (BARACLUDE) 1 MG tablet Take 1 mg by mouth at bedtime. 02/10/21 02/05/22 Yes [provider]  Evolocumab (REPATHA SURECLICK) 417 MG/ML SOAJ Inject 140 mg into the skin every 14 (fourteen) days. Patient taking differently: Inject 140 mg into the skin every 14 (fourteen) days. Every other Monday 05/08/21  Yes Loman Brooklyn, FNP  Hydroactive Dressings (TEGADERM HYDROCOLLOID THIN) MISC Apply 1 each topically every other day. Patient taking differently: Apply 1 each topically See admin instructions. Apply topically to sore on buttocks every other day 05/08/21  Yes Hendricks Limes F, FNP  lisinopril (ZESTRIL) 20 MG tablet Take 1 tablet (20 mg total) by mouth daily. Patient taking differently: Take 20 mg  by mouth at bedtime. 05/08/21  Yes Hendricks Limes F, FNP  metoprolol succinate (TOPROL-XL) 100 MG 24 hr tablet Take 1 tablet (100 mg total) by mouth daily. Patient taking differently: Take 100 mg by mouth at bedtime. 01/16/21  Yes Hendricks Limes F, FNP  mirtazapine (REMERON) 7.5 MG tablet Take 1 tablet (7.5 mg total) by mouth at bedtime. 05/08/21  Yes Loman Brooklyn, FNP  blood glucose meter kit and supplies KIT  Dispense based on patient and insurance preference. Use up to four times daily as directed. 02/21/21   Loman Brooklyn, FNP     Family History  Problem Relation Age of Onset   COPD Mother    Stroke Mother    Arthritis Brother        back issues   Cancer Brother        skin / back    Obesity Son     Social History   Socioeconomic History   Marital status: Married    Spouse name: Tim   Number of children: 1   Years of education: Not on file   Highest education level: Not on file  Occupational History   Occupation: disability   Tobacco Use   Smoking status: Every Day    Packs/day: 0.50    Types: Cigarettes   Smokeless tobacco: Never  Vaping Use   Vaping Use: Never used  Substance and Sexual Activity   Alcohol use: Not Currently   Drug use: Yes    Comment: injects illicit Fentanyl   Sexual activity: Not on file  Other Topics Concern   Not on file  Social History Narrative   Son lives 15 minutes away and hasn't visited in over a year.   Social Determinants of Health   Financial Resource Strain: High Risk   Difficulty of Paying Living Expenses: Hard  Food Insecurity: Food Insecurity Present   Worried About Running Out of Food in the Last Year: Sometimes true   Ran Out of Food in the Last Year: Sometimes true  Transportation Needs: No Transportation Needs   Lack of Transportation (Medical): No   Lack of Transportation (Non-Medical): No  Physical Activity: Inactive   Days of Exercise per Week: 0 days   Minutes of Exercise per Session: 0 min  Stress: Stress Concern Present   Feeling of Stress : To some extent  Social Connections: Moderately Isolated   Frequency of Communication with Friends and Family: More than three times a week   Frequency of Social Gatherings with Friends and Family: Twice a week   Attends Religious Services: Never   Marine scientist or Organizations: No   Attends Music therapist: Never   Marital Status: Married     Review of Systems: A 12 point ROS discussed and pertinent positives are indicated in the HPI above.  All other systems are negative.  Review of Systems  Constitutional:  Positive for activity change and fatigue.  Cardiovascular:  Negative for chest pain.  Gastrointestinal:  Negative for abdominal pain.  Musculoskeletal:  Positive for back pain and gait problem.  Neurological:  Positive for weakness.  Psychiatric/Behavioral:  Negative for behavioral problems and confusion.    Vital Signs: BP 133/73    Pulse 81    Temp 97.9 F (36.6 C) (Oral)    Resp 16    Ht '5\' 1"'  (1.549 m)    Wt 129 lb 6.2 oz (58.7 kg)    LMP 04/06/2011    SpO2 95%  BMI 24.45 kg/m   Physical Exam Vitals reviewed.  Cardiovascular:     Rate and Rhythm: Normal rate.  Pulmonary:     Effort: Pulmonary effort is normal.     Breath sounds: Normal breath sounds.  Abdominal:     Palpations: Abdomen is soft.     Tenderness: There is no abdominal tenderness.  Skin:    General: Skin is warm.  Neurological:     Mental Status: She is alert and oriented to person, place, and time.  Psychiatric:        Behavior: Behavior normal.    Imaging: MR THORACIC SPINE W WO CONTRAST  Result Date: 05/15/2021 CLINICAL DATA:  Thoracic spine osteomyelitis EXAM: MRI THORACIC WITHOUT AND WITH CONTRAST TECHNIQUE: Multiplanar and multiecho pulse sequences of the thoracic spine were obtained without and with intravenous contrast. CONTRAST:  28m GADAVIST GADOBUTROL 1 MMOL/ML IV SOLN COMPARISON:  CT abdomen/pelvis 05/15/2021, thoracic spine MRI 02/03/2019 FINDINGS: Alignment:  Normal. Vertebrae: Vertebral body heights are preserved. There is confluent T1 hypointensity and T2/STIR hyperintensity in the T11 and T12 vertebral bodies with marked endplate irregularity. There is associated enhancement of the vertebral bodies and heterogeneous enhancement of the disc space. Findings are consistent with discitis/osteomyelitis. There is additional  T2/STIR hyperintensity along the inferior aspect of the T10 vertebral body without significant endplate irregularity. There is no significant postcontrast enhancement, but the T10 vertebral body was normal in appearance on the prior study from 2020. There is edema and enhancement extending into the right posterior elements at T11 and T12 with perifacetal enhancement raising suspicion for early septic arthritis (11-9). There is no convincing evidence of septic arthritis on the left. There is thin epidural phlegmon both dorsally and ventrally measuring up to approximately 4 mm thick along the posterior T11 endplate and 2-3 mm thick dorsally. There is no central non enhancement to suggest formed abscess. The phlegmon results in mild narrowing of the spinal canal without mass effect on the cord. There is an intraosseous hemangioma in the L1 vertebral body, unchanged. Cord: Normal in signal and morphology. There is no abnormal cord enhancement. Paraspinal and other soft tissues: There is prevertebral/right paravertebraln edema and enhancement along the anterior endplates from TK53-Z76through T12-L1 with extension to the right neural foramina at T10-T11 through T12-L1. There is no definite organized prevertebral abscess, though the sagittal and axial postcontrast images are motion degraded. Disc levels: There is mild disc desiccation and narrowing at the remaining levels in the thoracic spine. There is mild multilevel facet arthropathy. There are scattered small disc protrusions. There is moderate left neural foraminal stenosis at T10-T11 and moderate to severe bilateral neural foraminal stenosis at T11-T12. There is mild spinal canal stenosis at T11-T12 primarily due to the epidural phlegmon, as described above. There is no other significant spinal canal stenosis in the thoracic spine. A prominent left facet spur is seen at L2-L3, incompletely evaluated but appears grossly similar to the prior lumbar spine MRI from  2020.n IMPRESSION: 1. Findings consistent with discitis/osteomyelitis at T11-T12 and probable early infection at T10. Findings are also suspicious for right septic arthritis at T11-T12. 2. Thin dorsal and ventral epidural phlegmon centered at T11-T12 without evidence of organized epidural abscess. This results in mild spinal canal stenosis without cord compression. 3. Right prevertebral/paravertebral phlegmon extending to the right neural foramina at T10-T11 through T12-L1 without convincing evidence of abscess formation. Electronically Signed   By: PValetta MoleM.D.   On: 05/15/2021 12:51   CT Abdomen Pelvis W  Contrast  Result Date: 05/15/2021 CLINICAL DATA:  Right lower quadrant abdominal pain, bright red blood in stools. EXAM: CT ABDOMEN AND PELVIS WITH CONTRAST TECHNIQUE: Multidetector CT imaging of the abdomen and pelvis was performed using the standard protocol following bolus administration of intravenous contrast. RADIATION DOSE REDUCTION: This exam was performed according to the departmental dose-optimization program which includes automated exposure control, adjustment of the mA and/or kV according to patient size and/or use of iterative reconstruction technique. CONTRAST:  33m OMNIPAQUE IOHEXOL 300 MG/ML  SOLN COMPARISON:  CT abdomen and pelvis dated February 03, 2019 FINDINGS: Lower chest: No acute abnormality. Hepatobiliary: Cirrhotic liver morphology. No suspicious focal liver lesions. Cholelithiasis with no gallbladder wall thickening. No biliary ductal dilation. Pancreas: Atrophic pancreas with no evidence of main pancreatic duct dilation or surrounding inflammatory changes. Spleen: Mild splenomegaly, measuring up to 14.0 cm. Adrenals/Urinary Tract: Bilateral adrenal glands are unremarkable. Kidneys enhance symmetrically with no evidence of hydronephrosis or nephrolithiasis. Bladder is unremarkable. Stomach/Bowel: Stomach is within normal limits. Appendix is not visualized. No evidence of  distention, or inflammatory changes. Mild wall thickening of the cecum and ascending colon. Vascular/Lymphatic: Aortic atherosclerosis. No enlarged abdominal or pelvic lymph nodes. Paraesophageal and splenic varices. Rectal and pararectal varices. Reproductive: Uterus and bilateral adnexa are unremarkable. Other: Trace abdominopelvic ascites.  No pneumoperitoneum. Musculoskeletal: New endplate irregularity at T11-T12 with surrounding paravertebral soft tissue. Multilevel degenerative disc disease which is similar to prior exam. IMPRESSION: 1. New endplate irregularity at T11-T12 with surrounding paravertebral soft tissue, concerning for osteomyelitis. Recommend contrast enhanced MRI of the thoracic spine for further evaluation. 2. Rectal and pararectal varices, which could be the cause of bright red blood in stools. 3. Cirrhotic liver morphology with sequela of portal hypertension including mild splenomegaly, upper abdominal varices and trace abdominal ascites. 4. Mild wall thickening of the cecum and ascending colon, likely due to portal colopathy. 5.  Aortic Atherosclerosis (ICD10-I70.0). Electronically Signed   By: LYetta GlassmanM.D.   On: 05/15/2021 10:12    Labs:  CBC: Recent Labs    05/16/21 0151 05/16/21 0919 05/17/21 0201 05/18/21 0215 05/19/21 0611  WBC 1.5*  --  1.7* 1.6* 1.6*  HGB 6.1* 7.4* 7.8* 7.3* 8.5*  HCT 20.8* 25.1* 25.7* 25.5* 28.6*  PLT 36*  --  36* 35* 43*    COAGS: Recent Labs    05/16/21 1350  INR 1.3*    BMP: Recent Labs    05/16/21 0151 05/17/21 0201 05/18/21 0215 05/19/21 0611  NA 135 135 137 138  K 3.6 3.2* 3.8 3.4*  CL 105 107 109 107  CO2 24 21* 22 24  GLUCOSE 181* 191* 207* 117*  BUN 7* 10 11 7*  CALCIUM 8.0* 8.1* 7.7* 8.0*  CREATININE 0.50 0.51 0.51 0.53  GFRNONAA >60 >60 >60 >60    LIVER FUNCTION TESTS: Recent Labs    05/15/21 0804 05/17/21 0201 05/18/21 0215 05/19/21 0611  BILITOT 0.5 1.3* 0.6 0.9  AST '23 26 23 24  ' ALT '14 14 12 14   ' ALKPHOS 104 75 69 69  PROT 7.0 6.5 6.2* 6.4*  ALBUMIN 2.4* 2.1* 2.0* 2.2*    TUMOR MARKERS: No results for input(s): AFPTM, CEA, CA199, CHROMGRNA in the last 8760 hours.  Assessment and Plan:  T11-12 discitis For aspiration in IR when Plt 75 or more Will keep on radar  Risks and benefits of disc aspiration was discussed with the patient and/or patient's family including, but not limited to bleeding, infection, damage to adjacent structures  or low yield requiring additional tests.  All of the questions were answered and there is agreement to proceed. Consent signed and in chart.   Thank you for this interesting consult.  I greatly enjoyed meeting Grace Stokes and look forward to participating in their care.  A copy of this report was sent to the requesting provider on this date.  Electronically Signed: Lavonia Drafts, PA-C 05/19/2021, 11:39 AM   I spent a total of 20 Minutes    in face to face in clinical consultation, greater than 50% of which was counseling/coordinating care for T11-12 disc aspiration

## 2021-05-19 NOTE — Progress Notes (Signed)
Progress Note   Patient: Grace Stokes MAU:[id number] DOB: 1955-08-21 DOA: 05/15/2021     4 DOS: the patient was seen and examined on 05/19/2021   Brief hospital course: 66 y/o female with history of chronic Hep B infection, cirrhosis, DM, HTN, IVDA with fentanyl, admitted to the hospital with rectal bleeding. Found to have rectal varices on CT imaging. Also noted to have significant anemia and thrombocytopenia. Transfusing PRBC prn. GI following. Also noted to have back pain and imaging indicating possible thoracic diskitis. ID and IR consulted. Will need aspiration of disc space, but barrier is currently thrombocytopenia. Holding antibiotics until aspirate for culture can be obtained.  Assessment and Plan: * Acute GI bleeding- (present on admission) -Patient presenting with 6 months of BRBPR; she does not have rectal bleeding when she is not having bowel movements -Imaging appears to be c/w rectal varices -She is afebrile at this time without tachycardia, and no leukocytosis; will not give antibiotics at this time.  -Continue to monitor for recurrent bleeding -GI following -defer need for endoscopic evaluation to GI -Reports that currently stools appear to be nonbloody    Malnutrition of moderate degree- (present on admission) Nutrition consult  ABLA (acute blood loss anemia)- (present on admission) Baseline hemoglobin appears to be around 12.7 on 01/16/21 She was noted to be 8.7 on 1/26 Hgb on admission 7.2 but dropped down to 6.1 today She has received 2 units of PRBC thus far.  Hemoglobin this morning noted to be 8.5.  DNR (do not resuscitate)- (present on admission) -CODE STATUS was discussed with the patient and she would not desire resuscitation and would prefer to die a natural death should that situation arise. -She will need a gold out of facility DNR form at the time of discharge  Discitis of thoracic region- (present on admission) -Patient with IVDA with h/o chronic  pain presenting with back pain. -CT A/P was done for GI bleeding and showed concern for discitis -MRI showed paravertebral infection/inflammation at T10-12 with phlegmons but no clear abscess -seen by IR, but cannot perform fluid aspiration until platelets over 75K -She received 1 unit of platelets on 2/5, but follow platelet count only 43K -I am doubtful we will be able to get her platelet count up over 75,000. -ID following, appreciate assistance -With vertebral osteo, HIV(negative) and quatiferon gold(pending) also orderd on admission -Per ID, will likely have her discharged on oral antibiotics with close ID follow-up  IV drug abuse ([place])- (present on admission) -Patient acknowledges IVDA with IV fentanyl. Review of prior records indicate that she was injecting heroin at one point -She is concerned about withdrawal -She reports a desire to quit and recognizes that ongoing use is life-threatening -Will initiate suboxone therapy now; while this may precipitate early withdrawal, hopefully she can reach a level of withdrawal symptom relief that will enable her to remain hospitalized and complete treatment -Initiated on Suboxone therapy order set -If patient experiences precipitated withdrawal, will give small, frequent doses of buprenorphine (2 mg q 1-2h with adjunctive Clonidine 0.1 mg q8h, Zofran, and NSAIDs) until the precipitated withdrawal is overcome -Will monitor on COWS protocol (5-12 mild symptoms; 13-24 moderate symptoms; 25-36 moderately severe symptoms; >36 severe symptoms) -TOC team consult for substance abuse education/support -prn orders from the Clonidine withdrawal order set were also ordered.  CAD (coronary artery disease)- (present on admission) -Continue ASA -Statin intolerant so on Repatha   Depression- (present on admission) -Continue Remeron  Chronic pain- (present on admission) -I have reviewed  this patient in the [place] Controlled Substances Reporting System. She is  not receiving controlled medications and rather is using only illicit fentanyl. -Feels that her pain is reasonably controlled with Suboxone  Diabetes mellitus type 2 in nonobese ([facility]) -Recent A1c was 6.9, indicating good control -hold Trulicity -Cover with moderate-scale SSI -Diabetes coordinator consulted  Other cirrhosis of liver ([place])- (present on admission) -Thought to be due to Hep B although ETOH/drug use is also a consideration -Normal LFTs at the time of admission -Ammonia level 61, but she is more awake and alert today. -INR 1.3 -GI is consulting  Other pancytopenia ([place])- (present on admission) -longstanding history of thrombocytopenia/leukopenia -suspect this is related to underlying cirrhosis/portal hypertension and hypersplenism  -may need to consider platelet transfusion prior to any procedures since it does not appear that her platelet count has been >75K in over 4 years -Patient received 1 unit of platelets on 2/5 with follow platelet count 43K -I am doubtful if we will be able to get her platelet count greater than 75K  Chronic active viral hepatitis B ([place])- (present on admission) -Continue entecavir -she has previously followed with hepatology at [place] hypertension- (present on admission) -Hold home metoprolol and lisinopril for now since patient is on the Clonidine protocol  Smoker- (present on admission) -Encourage cessation.   -This was discussed with the patient and should be reviewed on an ongoing basis.   -Patch ordered at patient request.        Subjective: Feels that her back pain is fairly well controlled.  She is able to ambulate.  Physical Exam: Vitals:   05/19/21 1138 05/19/21 1345 05/19/21 1550 05/19/21 2000  BP: 125/66 134/73 135/76 129/79  Pulse: 77 84 82 81  Resp: 13 18 16 15   Temp: 97.9 F (36.6 C) 98.3 F (36.8 C) 98.5 F (36.9 C) 98.9 F (37.2 C)  TempSrc: Oral  Oral Oral  SpO2: 95% 94% 95% 93%  Weight:       Height:       General exam: Alert, awake, oriented x 3 Respiratory system: Clear to auscultation. Respiratory effort normal. Cardiovascular system:RRR. No murmurs, rubs, gallops. Gastrointestinal system: Abdomen is nondistended, soft and nontender. No organomegaly or masses felt. Normal bowel sounds heard. Central nervous system: Alert and oriented. No focal neurological deficits. Extremities: No C/C/E, +pedal pulses Skin: No rashes, lesions or ulcers Psychiatry: Judgement and insight appear normal. Mood & affect appropriate.    Data Reviewed:  CBC and chemistry reviewed  Family Communication: Discussed with patient  Disposition: Status is: Inpatient Remains inpatient appropriate because: Continued management of discitis          Planned Discharge Destination: Home     Time spent: 35 minutes  Author: Kathie Dike, MD 05/19/2021 9:48 PM  For on call review [email or web address].

## 2021-05-19 NOTE — Patient Instructions (Addendum)
Visit Information  Patient Goals:  Manage emotions. Manage depression issues. Manage stress issues  Timeframe:  Short-Term Goal Priority:  High Progress: Not On Track Start Date:             05/19/21              Expected End Date:           08/14/21            Follow Up Date 07/10/21 at 2:00 PM    Manage emotions; manage depression issues; manage stress issues    Why is this important?   When you are stressed, down or upset, your body reacts too.  For example, your blood pressure may get higher; you may have a headache or stomachache.  When your emotions get the best of you, your body's ability to fight off cold and flu gets weak.  These steps will help you manage your emotions.     Patient Self Care Activities:  Self administers medications as prescribed Attends all scheduled provider appointments Performs ADL's independently Performs IADL's independently  Patient Coping Strengths:  Family Friends  Patient Self Care Deficits:  Mobility issues Pain issues Food procurement challenges Transport challenges  Patient Goals:  - spend time or talk with others at least 2 to 3 times per week - practice relaxation or meditation daily - keep a calendar with appointment dates  Follow Up Plan:  LCSW to call client or spouse of client on 07/10/21 at 2:00 PM to assess client needs.   Norva Riffle.Nadim Malia MSW, [place] [employer] representative [facility] Management [phone]

## 2021-05-20 ENCOUNTER — Other Ambulatory Visit (HOSPITAL_COMMUNITY): Payer: Self-pay

## 2021-05-20 DIAGNOSIS — K922 Gastrointestinal hemorrhage, unspecified: Secondary | ICD-10-CM | POA: Diagnosis not present

## 2021-05-20 LAB — CBC
HCT: 29.6 % — ABNORMAL LOW (ref 36.0–46.0)
Hemoglobin: 8.8 g/dL — ABNORMAL LOW (ref 12.0–15.0)
MCH: 23.3 pg — ABNORMAL LOW (ref 26.0–34.0)
MCHC: 29.7 g/dL — ABNORMAL LOW (ref 30.0–36.0)
MCV: 78.3 fL — ABNORMAL LOW (ref 80.0–100.0)
Platelets: 47 10*3/uL — ABNORMAL LOW (ref 150–400)
RBC: 3.78 MIL/uL — ABNORMAL LOW (ref 3.87–5.11)
RDW: 19 % — ABNORMAL HIGH (ref 11.5–15.5)
WBC: 1.7 10*3/uL — ABNORMAL LOW (ref 4.0–10.5)
nRBC: 0 % (ref 0.0–0.2)

## 2021-05-20 LAB — BPAM PLATELET PHERESIS
Blood Product Expiration Date: 202302062359
Blood Product Expiration Date: 202302062359
ISSUE DATE / TIME: 202302051838
ISSUE DATE / TIME: 202302061106
Unit Type and Rh: 6200
Unit Type and Rh: 6200

## 2021-05-20 LAB — C-REACTIVE PROTEIN: CRP: 0.5 mg/dL (ref <1.0)

## 2021-05-20 LAB — GLUCOSE, CAPILLARY
Glucose-Capillary: 139 mg/dL — ABNORMAL HIGH (ref 70–99)
Glucose-Capillary: 140 mg/dL — ABNORMAL HIGH (ref 70–99)
Glucose-Capillary: 144 mg/dL — ABNORMAL HIGH (ref 70–99)
Glucose-Capillary: 167 mg/dL — ABNORMAL HIGH (ref 70–99)
Glucose-Capillary: 181 mg/dL — ABNORMAL HIGH (ref 70–99)

## 2021-05-20 LAB — PREPARE PLATELET PHERESIS
Unit division: 0
Unit division: 0

## 2021-05-20 LAB — CULTURE, BLOOD (ROUTINE X 2)
Culture: NO GROWTH
Special Requests: ADEQUATE

## 2021-05-20 LAB — SEDIMENTATION RATE: Sed Rate: 32 mm/hr — ABNORMAL HIGH (ref 0–22)

## 2021-05-20 MED ORDER — CEFADROXIL 500 MG PO CAPS
1000.0000 mg | ORAL_CAPSULE | Freq: Two times a day (BID) | ORAL | 0 refills | Status: AC
  Filled 2021-05-20: qty 168, 42d supply, fill #0

## 2021-05-20 MED ORDER — DOXYCYCLINE HYCLATE 100 MG PO TABS
100.0000 mg | ORAL_TABLET | Freq: Two times a day (BID) | ORAL | Status: DC
  Administered 2021-05-20: 100 mg via ORAL
  Filled 2021-05-20: qty 1

## 2021-05-20 MED ORDER — BUPRENORPHINE HCL-NALOXONE HCL 2-0.5 MG SL FILM: 1.0000 | ORAL_FILM | Freq: Two times a day (BID) | SUBLINGUAL | 0 refills | Status: DC

## 2021-05-20 MED ORDER — DOXYCYCLINE HYCLATE 100 MG PO TABS
100.0000 mg | ORAL_TABLET | Freq: Two times a day (BID) | ORAL | 0 refills | Status: AC
  Filled 2021-05-20: qty 84, 42d supply, fill #0

## 2021-05-20 MED ORDER — CEFADROXIL 500 MG PO CAPS
1000.0000 mg | ORAL_CAPSULE | Freq: Two times a day (BID) | ORAL | Status: DC
  Administered 2021-05-20: 1000 mg via ORAL
  Filled 2021-05-20 (×2): qty 2

## 2021-05-20 NOTE — Progress Notes (Signed)
Discharge instructions provided to patient. All medications, discharge instructions, and follow up appointments discussed. IV out. Monitor off CCMD notified. PM dose of suboxone given per order from MD. Discharging to home with family.

## 2021-05-20 NOTE — Discharge Summary (Signed)
Physician Discharge Summary   Patient: Grace Stokes MRN: [id number] DOB: Dec 22, 1955  Admit date:     05/15/2021  Discharge date: 05/20/21  Discharge Physician: Kathie Dike   PCP: Loman Brooklyn, FNP   Recommendations at discharge:    Patient has been scheduled to follow up with infectious disease for diskitis Follow up with GI as needed Follow up with hepatology as previously scheduled She has been referred to [facility] for management on chronic back pain Follow up with pcp in 1 week  Discharge Diagnoses: Principal Problem:   Acute GI bleeding Active Problems:   Smoker   Essential hypertension   Chronic active viral hepatitis B (HCC)   Other pancytopenia ([place])   Other cirrhosis of liver ([place])   Diabetes mellitus type 2 in nonobese ([place])   Chronic pain   Depression   CAD (coronary artery disease)   IV drug abuse ([place])   Discitis of thoracic region   DNR (do not resuscitate)   ABLA (acute blood loss anemia)   Malnutrition of moderate degree  Resolved Problems:   * No resolved hospital problems. *   Hospital Course: 66 y/o female with history of chronic Hep B infection, cirrhosis, DM, HTN, IVDA with fentanyl, admitted to the hospital with rectal bleeding. Found to have rectal varices on CT imaging. Also noted to have significant anemia and thrombocytopenia. She was transfused a total of 2 units PRBC. GI following. Fortunately, here bleeding was self limited and appears to have resolved. She is now having normal BMs and Hgb has beens table. She was also noted to have back pain and imaging indicating possible thoracic diskitis. ID and IR consulted. Disc aspiration was considered with IR, but unfortunately, her chronic thrombocytopenia was prohibitive. Despite platelet transfusions, she was not able to have significant increase in PLTs to >75K to safely perform disc aspiration. ID has started her on a course of antibiotics and she will follow up with them.  Regarding opiate use/IVDA, patient expressed that she has been taking opiates to treat her chronic back pain. She was previously on high dose oxycodone until her previous providers stopped prescribing it. Since she could not find another provider to prescribe it, she was using illicit drugs to treat her pain. She was started on suboxone in the hospital with great pain control. She is motivated to stop using illicit drugs and was very pleased with her pain control with suboxone. She has been referred to a pain clinic for further chronic pain management. I have also discussed this plan with her PCP  Assessment and Plan: * Acute GI bleeding- (present on admission) -Patient presenting with 6 months of BRBPR; she does not have rectal bleeding when she is not having bowel movements -Imaging appears to be c/w rectal varices -She is afebrile at this time without tachycardia, and no leukocytosis; will not give antibiotics at this time.  -Continue to monitor for recurrent bleeding -GI following, she did not need endoscopic evaluation -Reports that currently stools appear to be nonbloody    Malnutrition of moderate degree- (present on admission) Nutrition consult  ABLA (acute blood loss anemia)- (present on admission) Baseline hemoglobin appears to be around 12.7 on 01/16/21 She was noted to be 8.7 on 1/26 Hgb on admission 7.2 but dropped down to 6.1 today She has received 2 units of PRBC thus far.  Hemoglobin this morning noted to be 8.5.  DNR (do not resuscitate)- (present on admission) -CODE STATUS was discussed with the patient and  she would not desire resuscitation and would prefer to die a natural death should that situation arise. -She will need a gold out of facility DNR form at the time of discharge  Discitis of thoracic region- (present on admission) -Patient with IVDA with h/o chronic pain presenting with back pain. -CT A/P was done for GI bleeding and showed concern for discitis -MRI  showed paravertebral infection/inflammation at T10-12 with phlegmons but no clear abscess -seen by IR, but cannot perform fluid aspiration due to chronic thrombocytopenia -ID following, appreciate assistance -With vertebral osteo, HIV(negative) and quatiferon gold(pending) also orderd on admission -Per ID, started on oral antibiotics and follow up scheduled  IV drug abuse ([place])- (present on admission) -Patient acknowledges IVDA with IV fentanyl. Review of prior records indicate that she was injecting heroin at one point -She is concerned about withdrawal -She reports a desire to quit and recognizes that ongoing use is life-threatening -She was initiated on suboxone therapy and has done very well. -She is requesting to continue the same after discharge -she has been referred to pain management clinic   CAD (coronary artery disease)- (present on admission) -Continue ASA -Statin intolerant so on Repatha   Depression- (present on admission) -Continue Remeron  Chronic pain- (present on admission) -I have reviewed this patient in the [place] Controlled Substances Reporting System. She is not receiving controlled medications and rather is using only illicit fentanyl. -Feels that her pain is reasonably controlled with Suboxone -she has been given a short course of suboxone to take after discharge and has been referred to a pain management clinic for further treatment  Diabetes mellitus type 2 in nonobese (HCC) -Recent A1c was 6.9, indicating good control -hold Trulicity -Cover with moderate-scale SSI -Diabetes coordinator consulted  Other cirrhosis of liver ([place])- (present on admission) -Thought to be due to Hep B although ETOH/drug use is also a consideration -Normal LFTs at the time of admission -INR 1.3 -GI is consulting  Other pancytopenia ([place])- (present on admission) -longstanding history of thrombocytopenia/leukopenia -suspect this is related to underlying cirrhosis/portal  hypertension and hypersplenism    Chronic active viral hepatitis B ([place])- (present on admission) -Continue entecavir -she has [place] [facility] [place] [place] hypertension- (present on admission) Resume home dose of metoprolol and lisinopril on discharge  Smoker- (present on admission) -Encourage cessation.   -This was discussed with the patient and should be reviewed on an ongoing basis.   -Patch ordered at patient request.         Pain control - [facility] Controlled Substance Reporting System database was reviewed. and patient was instructed, not to drive, operate heavy machinery, perform activities at heights, swimming or participation in water activities or provide baby-sitting services while on Pain, Sleep and Anxiety Medications; until their outpatient Physician has advised to do so again. Also recommended to not to take more than prescribed Pain, Sleep and Anxiety Medications.   Consultants: GI, infectious disease Procedures performed:   Disposition: Home Diet recommendation:  Discharge Diet Orders (From admission, onward)     Start     Ordered   05/20/21 0000  Diet - low sodium heart healthy        05/20/21 1737           Cardiac and Carb modified diet  DISCHARGE MEDICATION: Allergies as of 05/20/2021       Reactions   Gabapentin Other (See Comments)   Uncontrolled shakes / cold intolerance  Medication List     STOP taking these medications    aspirin 81 MG EC tablet       TAKE these medications    blood glucose meter kit and supplies Kit Dispense based on patient and insurance preference. Use up to four times daily as directed.   Buprenorphine HCl-Naloxone HCl 2-0.5 MG Film Place 1 Film under the tongue in the morning and at bedtime.   cefadroxil 500 MG capsule Commonly known as: DURICEF Take 2 capsules (1,000 mg total) by mouth 2 (two) times daily.   doxycycline 100 MG tablet Commonly known as:  VIBRA-TABS Take 1 tablet (100 mg total) by mouth 2 (two) times daily.   entecavir 1 MG tablet Commonly known as: BARACLUDE Take 1 mg by mouth at bedtime.   lisinopril 20 MG tablet Commonly known as: ZESTRIL Take 1 tablet (20 mg total) by mouth daily. What changed: when to take this   metoprolol succinate 100 MG 24 hr tablet Commonly known as: TOPROL-XL Take 1 tablet (100 mg total) by mouth daily. What changed: when to take this   mirtazapine 7.5 MG tablet Commonly known as: REMERON Take 1 tablet (7.5 mg total) by mouth at bedtime.   Repatha SureClick 025 MG/ML [place] Generic drug: Evolocumab Inject 140 mg into the skin every 14 (fourteen) days. What changed: additional instructions   Tegaderm Hydrocolloid Thin Misc Apply 1 each topically every other day. What changed:  how to take this when to take this additional instructions   Trulicity 1.5 [place]/7.0WC Sopn Generic drug: Dulaglutide Inject 1.5 mg into the skin once a week. What changed: when to take this        Follow-up Information     Comer, Okey Regal, MD Follow up.   Specialty: Infectious Diseases Why: 06/11/21 at 4:00pm. Please call to reschedule if you are not able to make this appointment. Contact information: [address] [place] [address] [phone]         Loman Brooklyn, FNP. Schedule an appointment as soon as possible for a visit in 1 week(s).   Specialty: Family Medicine Contact information: [place] [address] [phone]         referral to [facility] has been made Follow up.   Why: They will contact you with an appointment                Discharge Exam: Filed Weights   05/16/21 1207  Weight: 58.7 kg   General exam: Alert, awake, oriented x 3 Respiratory system: Clear to auscultation. Respiratory effort normal. Cardiovascular system:RRR. No murmurs, rubs, gallops. Gastrointestinal system: Abdomen is nondistended,  soft and nontender. No organomegaly or masses felt. Normal bowel sounds heard. Central nervous system: Alert and oriented. No focal neurological deficits. Extremities: No C/C/E, +pedal pulses Skin: No rashes, lesions or ulcers Psychiatry: Judgement and insight appear normal. Mood & affect appropriate.    Condition at discharge: good  The results of significant diagnostics from this hospitalization (including imaging, microbiology, ancillary and laboratory) are listed below for reference.   Imaging Studies: MR THORACIC SPINE W WO CONTRAST  Result Date: 05/15/2021 CLINICAL DATA:  Thoracic spine osteomyelitis EXAM: MRI THORACIC WITHOUT AND WITH CONTRAST TECHNIQUE: Multiplanar and multiecho pulse sequences of the thoracic spine were obtained without and with intravenous contrast. CONTRAST:  73m GADAVIST GADOBUTROL 1 MMOL/ML IV SOLN COMPARISON:  CT abdomen/pelvis 05/15/2021, thoracic spine MRI 02/03/2019 FINDINGS: Alignment:  Normal. Vertebrae: Vertebral body heights are preserved.  There is confluent T1 hypointensity and T2/STIR hyperintensity in the T11 and T12 vertebral bodies with marked endplate irregularity. There is associated enhancement of the vertebral bodies and heterogeneous enhancement of the disc space. Findings are consistent with discitis/osteomyelitis. There is additional T2/STIR hyperintensity along the inferior aspect of the T10 vertebral body without significant endplate irregularity. There is no significant postcontrast enhancement, but the T10 vertebral body was normal in appearance on the prior study from 2020. There is edema and enhancement extending into the right posterior elements at T11 and T12 with perifacetal enhancement raising suspicion for early septic arthritis (11-9). There is no convincing evidence of septic arthritis on the left. There is thin epidural phlegmon both dorsally and ventrally measuring up to approximately 4 mm thick along the posterior T11 endplate and 2-3 mm  thick dorsally. There is no central non enhancement to suggest formed abscess. The phlegmon results in mild narrowing of the spinal canal without mass effect on the cord. There is an intraosseous hemangioma in the L1 vertebral body, unchanged. Cord: Normal in signal and morphology. There is no abnormal cord enhancement. Paraspinal and other soft tissues: There is prevertebral/right paravertebraln edema and enhancement along the anterior endplates from N36-R44 through T12-L1 with extension to the right neural foramina at T10-T11 through T12-L1. There is no definite organized prevertebral abscess, though the sagittal and axial postcontrast images are motion degraded. Disc levels: There is mild disc desiccation and narrowing at the remaining levels in the thoracic spine. There is mild multilevel facet arthropathy. There are scattered small disc protrusions. There is moderate left neural foraminal stenosis at T10-T11 and moderate to severe bilateral neural foraminal stenosis at T11-T12. There is mild spinal canal stenosis at T11-T12 primarily due to the epidural phlegmon, as described above. There is no other significant spinal canal stenosis in the thoracic spine. A prominent left facet spur is seen at L2-L3, incompletely evaluated but appears grossly similar to the prior lumbar spine MRI from 2020.n IMPRESSION: 1. Findings consistent with discitis/osteomyelitis at T11-T12 and probable early infection at T10. Findings are also suspicious for right septic arthritis at T11-T12. 2. Thin dorsal and ventral epidural phlegmon centered at T11-T12 without evidence of organized epidural abscess. This results in mild spinal canal stenosis without cord compression. 3. Right prevertebral/paravertebral phlegmon extending to the right neural foramina at T10-T11 through T12-L1 without convincing evidence of abscess formation. Electronically Signed   By: Valetta Mole M.D.   On: 05/15/2021 12:51   CT Abdomen Pelvis W  Contrast  Result Date: 05/15/2021 CLINICAL DATA:  Right lower quadrant abdominal pain, bright red blood in stools. EXAM: CT ABDOMEN AND PELVIS WITH CONTRAST TECHNIQUE: Multidetector CT imaging of the abdomen and pelvis was performed using the standard protocol following bolus administration of intravenous contrast. RADIATION DOSE REDUCTION: This exam was performed according to the departmental dose-optimization program which includes automated exposure control, adjustment of the mA and/or kV according to patient size and/or use of iterative reconstruction technique. CONTRAST:  48m OMNIPAQUE IOHEXOL 300 MG/ML  SOLN COMPARISON:  CT abdomen and pelvis dated February 03, 2019 FINDINGS: Lower chest: No acute abnormality. Hepatobiliary: Cirrhotic liver morphology. No suspicious focal liver lesions. Cholelithiasis with no gallbladder wall thickening. No biliary ductal dilation. Pancreas: Atrophic pancreas with no evidence of main pancreatic duct dilation or surrounding inflammatory changes. Spleen: Mild splenomegaly, measuring up to 14.0 cm. Adrenals/Urinary Tract: Bilateral adrenal glands are unremarkable. Kidneys enhance symmetrically with no evidence of hydronephrosis or nephrolithiasis. Bladder is unremarkable. Stomach/Bowel: Stomach is within  normal limits. Appendix is not visualized. No evidence of distention, or inflammatory changes. Mild wall thickening of the cecum and ascending colon. Vascular/Lymphatic: Aortic atherosclerosis. No enlarged abdominal or pelvic lymph nodes. Paraesophageal and splenic varices. Rectal and pararectal varices. Reproductive: Uterus and bilateral adnexa are unremarkable. Other: Trace abdominopelvic ascites.  No pneumoperitoneum. Musculoskeletal: New endplate irregularity at T11-T12 with surrounding paravertebral soft tissue. Multilevel degenerative disc disease which is similar to prior exam. IMPRESSION: 1. New endplate irregularity at T11-T12 with surrounding paravertebral soft tissue,  concerning for osteomyelitis. Recommend contrast enhanced MRI of the thoracic spine for further evaluation. 2. Rectal and pararectal varices, which could be the cause of bright red blood in stools. 3. Cirrhotic liver morphology with sequela of portal hypertension including mild splenomegaly, upper abdominal varices and trace abdominal ascites. 4. Mild wall thickening of the cecum and ascending colon, likely due to portal colopathy. 5.  Aortic Atherosclerosis (ICD10-I70.0). Electronically Signed   By: Yetta Glassman M.D.   On: 05/15/2021 10:12    Microbiology: Results for orders placed or performed during the hospital encounter of 05/15/21  Resp Panel by RT-PCR (Flu A&B, Covid) Nasopharyngeal Swab     Status: None   Collection Time: 05/15/21 10:10 AM   Specimen: Nasopharyngeal Swab; Nasopharyngeal(NP) swabs in vial transport medium  Result Value Ref Range Status   SARS Coronavirus 2 by RT PCR NEGATIVE NEGATIVE Final    Comment: (NOTE) SARS-CoV-2 target nucleic acids are NOT DETECTED.  The SARS-CoV-2 RNA is generally detectable in upper respiratory specimens during the acute phase of infection. The lowest concentration of SARS-CoV-2 viral copies this assay can detect is 138 copies/mL. A negative result does not preclude SARS-Cov-2 infection and should not be used as the sole basis for treatment or other patient management decisions. A negative result may occur with  improper specimen collection/handling, submission of specimen other than nasopharyngeal swab, presence of viral mutation(s) within the areas targeted by this assay, and inadequate number of viral copies(<138 copies/mL). A negative result must be combined with clinical observations, patient history, and epidemiological information. The expected result is Negative.  Fact Sheet for Patients:  EntrepreneurPulse.com.au  Fact Sheet for Healthcare Providers:  IncredibleEmployment.be  This test is  no t yet approved or cleared by the [place] FDA and  has been authorized for detection and/or diagnosis of SARS-CoV-2 by FDA under an Emergency Use Authorization (EUA). This EUA will remain  in effect (meaning this test can be used) for the duration of the COVID-19 declaration under Section 564(b)(1) of the Act, 21 U.S.C.section 360bbb-3(b)(1), unless the authorization is terminated  or revoked sooner.       Influenza A by PCR NEGATIVE NEGATIVE Final   Influenza B by PCR NEGATIVE NEGATIVE Final    Comment: (NOTE) The Xpert Xpress SARS-CoV-2/FLU/RSV plus assay is intended as an aid in the diagnosis of influenza from Nasopharyngeal swab specimens and should not be used as a sole basis for treatment. Nasal washings and aspirates are unacceptable for Xpert Xpress SARS-CoV-2/FLU/RSV testing.  Fact Sheet for Patients: EntrepreneurPulse.com.au  Fact Sheet for Healthcare Providers: IncredibleEmployment.be  This test is not yet approved or cleared by the [place] FDA and has been authorized for detection and/or diagnosis of SARS-CoV-2 by FDA under an Emergency Use Authorization (EUA). This EUA will remain in effect (meaning this test can be used) for the duration of the COVID-19 declaration under Section 564(b)(1) of the Act, 21 U.S.C. section 360bbb-3(b)(1), unless the authorization is terminated or revoked.  Performed at  [facility], [place] [address] [place], [place] [address]   Culture, blood (routine x 2)     Status: None   Collection Time: 05/15/21  3:30 PM   Specimen: BLOOD  Result Value Ref Range Status   Specimen Description BLOOD RIGHT ANTECUBITAL  Final   Special Requests   Final    BOTTLES DRAWN AEROBIC AND ANAEROBIC Blood Culture adequate volume   Culture   Final    NO GROWTH 5 DAYS Performed at [facility], [place] [address] [place], [place] [address]    Report Status 05/20/2021 FINAL  Final  Culture, blood  (routine x 2)     Status: None (Preliminary result)   Collection Time: 05/16/21  1:51 AM   Specimen: BLOOD LEFT HAND  Result Value Ref Range Status   Specimen Description BLOOD LEFT HAND  Final   Special Requests   Final    BOTTLES DRAWN AEROBIC AND ANAEROBIC Blood Culture adequate volume   Culture   Final    NO GROWTH 4 DAYS Performed at [facility], [place] [address], [place], [place] [address]    Report Status PENDING  Incomplete    Labs: CBC: Recent Labs  Lab 05/15/21 0804 05/16/21 0151 05/16/21 0919 05/17/21 0201 05/18/21 0215 05/19/21 0611 05/20/21 0705  WBC 1.9* 1.5*  --  1.7* 1.6* 1.6* 1.7*  NEUTROABS 1.0*  --   --   --   --   --   --   HGB 7.2* 6.1* 7.4* 7.8* 7.3* 8.5* 8.8*  HCT 24.8* 20.8* 25.1* 25.7* 25.5* 28.6* 29.6*  MCV 78.5* 75.1*  --  73.4* 75.4* 77.5* 78.3*  PLT 42* 36*  --  36* 35* 43* 47*   Basic Metabolic Panel: Recent Labs  Lab 05/15/21 0804 05/16/21 0151 05/17/21 0201 05/18/21 0215 05/19/21 0611  NA 133* 135 135 137 138  K 3.8 3.6 3.2* 3.8 3.4*  CL 103 105 107 109 107  CO2 23 24 21* 22 24  GLUCOSE 299* 181* 191* 207* 117*  BUN 7* 7* 10 11 7*  CREATININE 0.46 0.50 0.51 0.51 0.53  CALCIUM 7.9* 8.0* 8.1* 7.7* 8.0*  MG  --   --  1.6* 2.2  --   PHOS  --   --  3.3  --   --    Liver Function Tests: Recent Labs  Lab 05/15/21 0804 05/17/21 0201 05/18/21 0215 05/19/21 0611  AST _0 ALT _1 ALKPHOS 104 75 69 69  BILITOT 0.5 1.3* 0.6 0.9  PROT 7.0 6.5 6.2* 6.4*  ALBUMIN 2.4* 2.1* 2.0* 2.2*   CBG: Recent Labs  Lab 05/20/21 0003 05/20/21 0422 05/20/21 0756 05/20/21 1203 05/20/21 1530  GLUCAP 181* 144* 139* 167* 140*    Discharge time spent: greater than 30 minutes.  Signed: Kathie Dike, MD [facility] 05/20/2021

## 2021-05-20 NOTE — Progress Notes (Signed)
[place] for Infectious Disease  Date of Admission:  05/15/2021     Total days of antibiotics 2         ASSESSMENT:  Ms. Navarrette platelets remain below 75 today and unable to proceed with aspiration. Will plan for 6 weeks of doxycycline 100 mg PO bid and cefadroxil 100 mg PO bid for discitis/osteomyelitis. [facility] to bring medications to beside. [place] for discharge from [place]. Follow up arranged in ID clinic. Remaining medical and supportive care per primary team. ID will sign off.   PLAN:  Start doxycycline and cefadroxil. [facility] to bring medications to the bedside [facility] for discharge from ID perspective.  Follow up in the ID office.  Remaining medical and supportive care per primary team.   Principal Problem:   Acute GI bleeding Active Problems:   Smoker   Essential hypertension   Chronic active viral hepatitis B (HCC)   Other pancytopenia (HCC)   Other cirrhosis of liver (HCC)   Diabetes mellitus type 2 in nonobese (HCC)   Chronic pain   Depression   CAD (coronary artery disease)   IV drug abuse ([place])   Discitis of thoracic region   DNR (do not resuscitate)   ABLA (acute blood loss anemia)   Malnutrition of moderate degree    buprenorphine-naloxone  1 tablet Sublingual BID   cefadroxil  1,000 mg Oral BID   cloNIDine  0.1 mg Oral QAC breakfast   doxycycline  100 mg Oral Q12H   feeding supplement  237 mL Oral TID BM   hydrocortisone  25 mg Rectal BID   insulin aspart  0-15 Units Subcutaneous TID WC   mirtazapine  7.5 mg Oral QHS   multivitamin with minerals  1 tablet Oral Daily   sodium chloride flush  3 mL Intravenous Q12H   Vitamin D (Ergocalciferol)  50,000 Units Oral Q7 days    SUBJECTIVE:  Afebrile overnight with no acute events. Platelet levels remain <75. Continues to have back pain and wondering when she will be able to go home.   Allergies  Allergen Reactions   Gabapentin Other (See Comments)    Uncontrolled  shakes / cold intolerance      Review of Systems: Review of Systems  Constitutional:  Negative for chills, fever and weight loss.  Respiratory:  Negative for cough, shortness of breath and wheezing.   Cardiovascular:  Negative for chest pain and leg swelling.  Gastrointestinal:  Negative for abdominal pain, constipation, diarrhea, nausea and vomiting.  Musculoskeletal:  Positive for back pain.  Skin:  Negative for rash.     OBJECTIVE: Vitals:   05/19/21 2251 05/20/21 0400 05/20/21 0721 05/20/21 0747  BP: 122/77 (!) 102/57 118/63 (!) 118/57  Pulse: 83 82 84 72  Resp: 17 13 14    Temp: 99.2 F (37.3 C) 98.4 F (36.9 C) 98 F (36.7 C) 98.2 F (36.8 C)  TempSrc: Oral Oral Oral Oral  SpO2: 95% 92% 92% 94%  Weight:      Height:       Body mass index is 24.45 kg/m.  Physical Exam Constitutional:      General: She is not in acute distress.    Appearance: She is well-developed.     Comments: Lying in bed with head of bed elevated; pleasant.   Cardiovascular:     Rate and Rhythm: Normal rate and regular rhythm.     Heart sounds: Normal heart sounds.  Pulmonary:     Effort:  Pulmonary effort is normal.     Breath sounds: Normal breath sounds.  Skin:    General: Skin is warm and dry.  Neurological:     Mental Status: She is alert.  Psychiatric:        Mood and Affect: Mood normal.        Thought Content: Thought content normal.        Judgment: Judgment normal.    Lab Results Lab Results  Component Value Date   WBC 1.7 (L) 05/20/2021   HGB 8.8 (L) 05/20/2021   HCT 29.6 (L) 05/20/2021   MCV 78.3 (L) 05/20/2021   PLT 47 (L) 05/20/2021    Lab Results  Component Value Date   CREATININE 0.53 05/19/2021   BUN 7 (L) 05/19/2021   NA 138 05/19/2021   K 3.4 (L) 05/19/2021   CL 107 05/19/2021   CO2 24 05/19/2021    Lab Results  Component Value Date   ALT 14 05/19/2021   AST 24 05/19/2021   ALKPHOS 69 05/19/2021   BILITOT 0.9 05/19/2021     Microbiology: Recent  Results (from the past 240 hour(s))  Resp Panel by RT-PCR (Flu A&B, Covid) Nasopharyngeal Swab     Status: None   Collection Time: 05/15/21 10:10 AM   Specimen: Nasopharyngeal Swab; Nasopharyngeal(NP) swabs in vial transport medium  Result Value Ref Range Status   SARS Coronavirus 2 by RT PCR NEGATIVE NEGATIVE Final    Comment: (NOTE) SARS-CoV-2 target nucleic acids are NOT DETECTED.  The SARS-CoV-2 RNA is generally detectable in upper respiratory specimens during the acute phase of infection. The lowest concentration of SARS-CoV-2 viral copies this assay can detect is 138 copies/mL. A negative result does not preclude SARS-Cov-2 infection and should not be used as the sole basis for treatment or other patient management decisions. A negative result may occur with  improper specimen collection/handling, submission of specimen other than nasopharyngeal swab, presence of viral mutation(s) within the areas targeted by this assay, and inadequate number of viral copies(<138 copies/mL). A negative result must be combined with clinical observations, patient history, and epidemiological information. The expected result is Negative.  Fact Sheet for Patients:  EntrepreneurPulse.com.au  Fact Sheet for Healthcare Providers:  IncredibleEmployment.be  This test is no t yet approved or cleared by the [place] FDA and  has been authorized for detection and/or diagnosis of SARS-CoV-2 by FDA under an Emergency Use Authorization (EUA). This EUA will remain  in effect (meaning this test can be used) for the duration of the COVID-19 declaration under Section 564(b)(1) of the Act, 21 U.S.C.section 360bbb-3(b)(1), unless the authorization is terminated  or revoked sooner.       Influenza A by PCR NEGATIVE NEGATIVE Final   Influenza B by PCR NEGATIVE NEGATIVE Final    Comment: (NOTE) The Xpert Xpress SARS-CoV-2/FLU/RSV plus assay is intended as an aid in the  diagnosis of influenza from Nasopharyngeal swab specimens and should not be used as a sole basis for treatment. Nasal washings and aspirates are unacceptable for Xpert Xpress SARS-CoV-2/FLU/RSV testing.  Fact Sheet for Patients: EntrepreneurPulse.com.au  Fact Sheet for Healthcare Providers: IncredibleEmployment.be  This test is not yet approved or cleared by the [place] FDA and has been authorized for detection and/or diagnosis of SARS-CoV-2 by FDA under an Emergency Use Authorization (EUA). This EUA will remain in effect (meaning this test can be used) for the duration of the COVID-19 declaration under Section 564(b)(1) of the Act, 21 U.S.C. section 360bbb-3(b)(1), unless the  authorization is terminated or revoked.  Performed at [facility], [place] [address] [place], [place] [address]   Culture, blood (routine x 2)     Status: None   Collection Time: 05/15/21  3:30 PM   Specimen: BLOOD  Result Value Ref Range Status   Specimen Description BLOOD RIGHT ANTECUBITAL  Final   Special Requests   Final    BOTTLES DRAWN AEROBIC AND ANAEROBIC Blood Culture adequate volume   Culture   Final    NO GROWTH 5 DAYS Performed at [facility], [place] [address], [place], [place] [address]    Report Status 05/20/2021 FINAL  Final  Culture, blood (routine x 2)     Status: None (Preliminary result)   Collection Time: 05/16/21  1:51 AM   Specimen: BLOOD LEFT HAND  Result Value Ref Range Status   Specimen Description BLOOD LEFT HAND  Final   Special Requests   Final    BOTTLES DRAWN AEROBIC AND ANAEROBIC Blood Culture adequate volume   Culture   Final    NO GROWTH 4 DAYS Performed at [facility], [place] [address], [place], [place] [address]    Report Status PENDING  Incomplete     Terri Piedra, [place] for Infectious Disease [facility]  05/20/2021  10:52 AM

## 2021-05-21 ENCOUNTER — Ambulatory Visit: Payer: Medicare HMO | Admitting: Family Medicine

## 2021-05-21 ENCOUNTER — Telehealth: Payer: Self-pay

## 2021-05-21 LAB — QUANTIFERON-TB GOLD PLUS: QuantiFERON-TB Gold Plus: NEGATIVE

## 2021-05-21 LAB — QUANTIFERON-TB GOLD PLUS (RQFGPL)
QuantiFERON Mitogen Value: 4.37 IU/mL
QuantiFERON Nil Value: 0.03 IU/mL
QuantiFERON TB1 Ag Value: 0.05 IU/mL
QuantiFERON TB2 Ag Value: 0.04 IU/mL

## 2021-05-21 LAB — VITAMIN A: Vitamin A (Retinoic Acid): 7.9 ug/dL — ABNORMAL LOW (ref 22.0–69.5)

## 2021-05-21 LAB — CULTURE, BLOOD (ROUTINE X 2)
Culture: NO GROWTH
Special Requests: ADEQUATE

## 2021-05-21 NOTE — Telephone Encounter (Addendum)
Transition Care Management Unsuccessful Follow-up Telephone Call  Date of discharge and from where:  [facility] 05-20-21 Dx: GI bleed   Attempts:  1st Attempt  Reason for unsuccessful TCM follow-up call:  Unable to leave message  Transition Care Management Unsuccessful Follow-up Telephone Call  Date of discharge and from where:  [place] 05-20-21 Dx: GI bleed   Attempts:  2nd Attempt  Reason for unsuccessful TCM follow-up call:  Unable to leave message  Transition Care Management Unsuccessful Follow-up Telephone Call  Date of discharge and from where:  [place] 05-20-21 Dx: GI bleed  Attempts:  3rd Attempt  Reason for unsuccessful TCM follow-up call:  Left voice message

## 2021-05-22 ENCOUNTER — Other Ambulatory Visit (HOSPITAL_COMMUNITY): Payer: Self-pay

## 2021-05-23 ENCOUNTER — Telehealth: Payer: Medicare HMO | Admitting: *Deleted

## 2021-05-23 ENCOUNTER — Telehealth: Payer: Self-pay | Admitting: *Deleted

## 2021-05-23 NOTE — Telephone Encounter (Signed)
°  Care Management   Follow Up Note   05/23/2021 Name: Grace [facility] MRN: [id number] DOB: 1956/03/01   Referred by: Loman Brooklyn, FNP Reason for referral : Chronic Care Management (Unsuccessful telephone follow-up)   An unsuccessful telephone outreach was attempted today. The patient was referred to the case management team for assistance with care management and care coordination.  Unable to leave a message on mobile or home phone number.   Follow Up Plan: Forwarding to [facility] Care Guide for outreach and rescheduling.   Chong Sicilian, BSN, RN-BC Embedded Chronic Care Manager [facility] [place] [facility] Direct Dial: [phone]

## 2021-05-26 ENCOUNTER — Telehealth: Payer: Self-pay | Admitting: Family Medicine

## 2021-05-26 NOTE — Telephone Encounter (Signed)
Transition Care Management Follow-up Telephone Call Date of discharge and from where: 05/20/21 - [place] - GI Bleed How have you been since you were released from the hospital? Better - just now able to walk around - has been too weak Any questions or concerns? No  Items Reviewed: Did the pt receive and understand the discharge instructions provided? Yes  Medications obtained and verified? Yes  Other? No  Any new allergies since your discharge? No  Dietary orders reviewed? Yes Do you have support at home? Yes   Home Care and Equipment/Supplies: Were home health services ordered? no If so, what is the name of the agency? N/a  Has the agency set up a time to come to the patient's home? not applicable Were any new equipment or medical supplies ordered?  No What is the name of the medical supply agency? N/a Were you able to get the supplies/equipment? not applicable Do you have any questions related to the use of the equipment or supplies? No  Functional Questionnaire: (I = Independent and D = Dependent) ADLs: I  Bathing/Dressing- I  Meal Prep- D  Eating- I  Maintaining continence- D - she can sometimes get up to bedside commode  Transferring/Ambulation- I - but has to hold on to the wall and use walker (can't use walker in house because there's not enough space)  Managing Meds- I  Follow up appointments reviewed:  PCP Hospital f/u appt confirmed? Yes  Scheduled to see Hendricks Limes on 05/29/21 @ 2:05. [facility] f/u appt confirmed? Yes  Scheduled to see Infectious Disease specialist - Dr Linus Salmons on 06/11/21 @ 4. Are transportation arrangements needed? No  If their condition worsens, is the pt aware to call PCP or go to the Emergency Dept.? Yes Was the patient provided with contact information for the PCP's office or ED? Yes Was to pt encouraged to call back with questions or concerns? Yes

## 2021-05-26 NOTE — Telephone Encounter (Signed)
See TCM note from 05/21/21

## 2021-05-29 ENCOUNTER — Ambulatory Visit (INDEPENDENT_AMBULATORY_CARE_PROVIDER_SITE_OTHER): Payer: Medicare HMO | Admitting: Family Medicine

## 2021-05-29 ENCOUNTER — Encounter: Payer: Self-pay | Admitting: Family Medicine

## 2021-05-29 VITALS — BP 128/73 | HR 88 | Temp 98.9°F | Ht 61.0 in | Wt 132.0 lb

## 2021-05-29 DIAGNOSIS — G4701 Insomnia due to medical condition: Secondary | ICD-10-CM

## 2021-05-29 DIAGNOSIS — K922 Gastrointestinal hemorrhage, unspecified: Secondary | ICD-10-CM | POA: Diagnosis not present

## 2021-05-29 DIAGNOSIS — F331 Major depressive disorder, recurrent, moderate: Secondary | ICD-10-CM | POA: Diagnosis not present

## 2021-05-29 DIAGNOSIS — M4644 Discitis, unspecified, thoracic region: Secondary | ICD-10-CM | POA: Diagnosis not present

## 2021-05-29 DIAGNOSIS — E440 Moderate protein-calorie malnutrition: Secondary | ICD-10-CM

## 2021-05-29 DIAGNOSIS — G894 Chronic pain syndrome: Secondary | ICD-10-CM

## 2021-05-29 DIAGNOSIS — R6889 Other general symptoms and signs: Secondary | ICD-10-CM | POA: Diagnosis not present

## 2021-05-29 DIAGNOSIS — K649 Unspecified hemorrhoids: Secondary | ICD-10-CM | POA: Diagnosis not present

## 2021-05-29 MED ORDER — MIRTAZAPINE 15 MG PO TABS: 15.0000 mg | ORAL_TABLET | Freq: Every day | ORAL | 2 refills | Status: DC

## 2021-05-29 NOTE — Patient Instructions (Addendum)
Call your insurance company and ask them for your preferred DME provider in our area. This is for the gel mattress overlay. Let us know and we will get it sent to the right place.   [facility] Phone: [phone] Please call them to get your appointment set up for pain management.

## 2021-05-29 NOTE — Progress Notes (Signed)
Assessment & Plan:  1. Acute GI bleeding Resolved. - CBC with Differential/Platelet - CMP14+EGFR  2. Rectal varices Bleeding has resolved. - CBC with Differential/Platelet - CMP14+EGFR  3. Discitis of thoracic region Continue antibiotics until courses have been completed.  Patient will keep follow-up with infectious disease. - CBC with Differential/Platelet - CMP14+EGFR  4. Chronic pain syndrome Phone number provided for [facility] where her pain management referral was sent so that she can call and get an appointment set up.  5. Malnutrition of moderate degree Reassessing lab values prior to treating since she was acutely in the hospital. - Vitamin A - Vitamin B6 - CBC with Differential/Platelet - CMP14+EGFR - VITAMIN D 25 Hydroxy (Vit-D Deficiency, Fractures)  6. Insomnia due to medical condition Uncontrolled.  Remeron increased from 7.5 mg to 15 mg at bedtime. - mirtazapine (REMERON) 15 MG tablet; Take 1 tablet (15 mg total) by mouth at bedtime.  Dispense: 30 tablet; Refill: 2   Return in about 3 months (around 08/26/2021) for annual physical.  Hendricks Limes, MSN, APRN, FNP-C [facility]  Subjective:    Patient ID: Grace Stokes, female    DOB: 04-Nov-1955, 66 y.o.   MRN: [id number]  Patient Care Team: Loman Brooklyn, FNP as PCP - General (Family Medicine) Roosevelt Locks, CRNP as Nurse Practitioner (Nephrology) Shea Evans Norva Riffle, LCSW as [place] [facility] (Licensed Clinical Social Worker) Ilean China, RN as Case Manager Anthony Sar Donnie Coffin, MD as Referring Physician (Optometry)   Chief Complaint:  Chief Complaint  Patient presents with   Transitions Of Care    HPI: Grace Stokes is a 66 y.o. female presenting on 05/29/2021 for Transitions Of Care  Patient was admitted to [facility] 05/15/2021-05/20/2021 after lab work resulted in our office and she was found to be anemic with reports of  rectal bleeding.  She was found to have rectal varices on CT imaging.  She was transfused a total of 2 units PRBC.  Bleeding resolved and she did not require an endoscopic evaluation.  Further imaging also revealed thoracic discitis.  Disc aspiration was considered but due to her chronic thrombocytopenia they were not able to safely perform the disc aspiration.  Infectious disease started her on antibiotics which were continued at discharge as oral medications.  Patient was using IV fentanyl to treat her pain after oxycodone was discontinued by previous provider.  She was started on Suboxone in the hospital and referred to a pain clinic for further chronic pain management.  Nutrition was also consulted in the hospital due to malnutrition of a moderate degree; patient was found to have vitamin D, vitamin A, and vitamin B6 deficiencies.  She is here today and reports she is taking cefadroxil and doxycycline as prescribed. She has a follow-up scheduled with infectious disease on 06/11/2021.   She has had no further episodes of bleeding. She has stopped taking aspirin.   She reports her pain is well controlled with the Suboxone. She has not heard from [facility] yet regarding an appointment.   Patient reports she is not sleeping well.    Social history:  Relevant past medical, surgical, family and social history reviewed and updated as indicated. Interim medical history since our last visit reviewed.  Allergies and medications reviewed and updated.  DATA REVIEWED: CHART IN EPIC  ROS: Negative unless specifically indicated above in HPI.    Current Outpatient Medications:    blood glucose meter kit and  supplies KIT, Dispense based on patient and insurance preference. Use up to four times daily as directed., Disp: 1 each, Rfl: 0   Buprenorphine HCl-Naloxone HCl 2-0.5 MG FILM, Place 1 Film under the tongue in the morning and at bedtime., Disp: 30 each, Rfl: 0   cefadroxil (DURICEF) 500 MG  capsule, Take 2 capsules (1,000 mg total) by mouth 2 (two) times daily., Disp: 168 capsule, Rfl: 0   doxycycline (VIBRA-TABS) 100 MG tablet, Take 1 tablet (100 mg total) by mouth 2 (two) times daily., Disp: 84 tablet, Rfl: 0   Dulaglutide (TRULICITY) 1.5 UR/4.2HC SOPN, Inject 1.5 mg into the skin once a week. (Patient taking differently: Inject 1.5 mg into the skin every Sunday.), Disp: 2 mL, Rfl: 2   entecavir (BARACLUDE) 1 MG tablet, Take 1 mg by mouth at bedtime., Disp: , Rfl:    Evolocumab (REPATHA SURECLICK) 623 MG/ML SOAJ, Inject 140 mg into the skin every 14 (fourteen) days. (Patient taking differently: Inject 140 mg into the skin every 14 (fourteen) days. Every other Monday), Disp: 2 mL, Rfl: 2   Hydroactive Dressings (TEGADERM HYDROCOLLOID THIN) MISC, Apply 1 each topically every other day. (Patient taking differently: Apply 1 each topically See admin instructions. Apply topically to sore on buttocks every other day), Disp: 15 each, Rfl: 2   lisinopril (ZESTRIL) 20 MG tablet, Take 1 tablet (20 mg total) by mouth daily. (Patient taking differently: Take 20 mg by mouth at bedtime.), Disp: 90 tablet, Rfl: 1   metoprolol succinate (TOPROL-XL) 100 MG 24 hr tablet, Take 1 tablet (100 mg total) by mouth daily. (Patient taking differently: Take 100 mg by mouth at bedtime.), Disp: 90 tablet, Rfl: 1   mirtazapine (REMERON) 7.5 MG tablet, Take 1 tablet (7.5 mg total) by mouth at bedtime., Disp: 90 tablet, Rfl: 1   Allergies  Allergen Reactions   Gabapentin Other (See Comments)    Uncontrolled shakes / cold intolerance    Past Medical History:  Diagnosis Date   Arthritis    Bell's palsy    neuropathy    CAD (coronary artery disease)    Cataract    Chronic pain    Cirrhosis ([place])    Depression    Diabetes mellitus type 2 in nonobese ([place])    Hypertension    IV drug abuse ([place])    Scoliosis     Past Surgical History:  Procedure Laterality Date   ANTERIOR CERVICAL DECOMP/DISCECTOMY FUSION  N/A 07/09/2013   Procedure: ANTERIOR CERVICAL DECOMPRESSION/DISCECTOMY FUSION 2 LEVELS;  Surgeon: Consuella Lose, MD;  Location: [facility];  Service: Neurosurgery;  Laterality: N/A;  Anterior Cervical Fusion Cervical five-six, six-seven.   BACK SURGERY     CARPAL TUNNEL RELEASE     CESAREAN SECTION     ELBOW SURGERY Left    I & D EXTREMITY Left 12/17/2016   Procedure: IRRIGATION AND DEBRIDEMENT LEFT HAND;  Surgeon: Iran Planas, MD;  Location: [facility];  Service: Orthopedics;  Laterality: Left;   INCISION / DRAINAGE HAND / FINGER Left 12/17/2016   ROTATOR CUFF REPAIR     SHOULDER SURGERY     TUBAL LIGATION      Social History   Socioeconomic History   Marital status: Married    Spouse name: Tim   Number of children: 1   Years of education: Not on file   Highest education level: Not on file  Occupational History   Occupation: disability   Tobacco Use   Smoking status: Every Day  Packs/day: 0.50    Types: Cigarettes   Smokeless tobacco: Never  Vaping Use   Vaping Use: Never used  Substance and Sexual Activity   Alcohol use: Not Currently   Drug use: Yes    Comment: injects illicit Fentanyl   Sexual activity: Not on file  Other Topics Concern   Not on file  Social History Narrative   Son lives 15 minutes away and hasn't visited in over a year.   Social Determinants of Health   Financial Resource Strain: High Risk   Difficulty of Paying Living Expenses: Hard  Food Insecurity: Food Insecurity Present   Worried About Running Out of Food in the Last Year: Sometimes true   Ran Out of Food in the Last Year: Sometimes true  Transportation Needs: No Transportation Needs   Lack of Transportation (Medical): No   Lack of Transportation (Non-Medical): No  Physical Activity: Inactive   Days of Exercise per Week: 0 days   Minutes of Exercise per Session: 0 min  Stress: Stress Concern Present   Feeling of Stress : To some extent  Social Connections: Moderately Isolated    Frequency of Communication with Friends and Family: More than three times a week   Frequency of Social Gatherings with Friends and Family: Twice a week   Attends Religious Services: Never   Marine scientist or Organizations: No   Attends Music therapist: Never   Marital Status: Married  Human resources officer Violence: Not At Risk   Fear of Current or Ex-Partner: No   Emotionally Abused: No   Physically Abused: No   Sexually Abused: No        Objective:    BP 128/73    Pulse 88    Temp 98.9 F (37.2 C)    Ht 5' 1" (1.549 m)    Wt 132 lb (59.9 kg)    LMP 04/06/2011    SpO2 97%    BMI 24.94 kg/m   Wt Readings from Last 3 Encounters:  05/29/21 132 lb (59.9 kg)  05/16/21 129 lb 6.2 oz (58.7 kg)  05/08/21 129 lb 6.4 oz (58.7 kg)    Physical Exam Vitals reviewed.  Constitutional:      General: She is not in acute distress.    Appearance: Normal appearance. She is normal weight. She is not ill-appearing, toxic-appearing or diaphoretic.  HENT:     Head: Normocephalic and atraumatic.     Right Ear: Tympanic membrane, ear canal and external ear normal.     Left Ear: Tympanic membrane, ear canal and external ear normal.     Nose: Nose normal. No congestion.     Mouth/Throat:     Mouth: Mucous membranes are moist.     Pharynx: Oropharynx is clear. No oropharyngeal exudate or posterior oropharyngeal erythema.  Eyes:     General: No scleral icterus.       Right eye: No discharge.        Left eye: No discharge.     Conjunctiva/sclera: Conjunctivae normal.  Cardiovascular:     Rate and Rhythm: Normal rate and regular rhythm.     Pulses: Normal pulses.     Heart sounds: Normal heart sounds. No murmur heard.   No friction rub. No gallop.  Pulmonary:     Effort: Pulmonary effort is normal. No respiratory distress.     Breath sounds: Normal breath sounds. No stridor. No wheezing, rhonchi or rales.  Abdominal:     General: Bowel sounds are normal.  There is no distension.      Palpations: Abdomen is soft. There is no mass.  Musculoskeletal:        General: Normal range of motion.     Cervical back: Normal range of motion.     Thoracic back: Deformity and tenderness present. Scoliosis present.     Lumbar back: Deformity and tenderness present. Scoliosis present.  Skin:    General: Skin is warm and dry.     Capillary Refill: Capillary refill takes less than 2 seconds.  Neurological:     General: No focal deficit present.     Mental Status: She is alert and oriented to person, place, and time. Mental status is at baseline.     Motor: No weakness.     Gait: Gait abnormal (hunched over and uses a walker).  Psychiatric:        Mood and Affect: Mood normal.        Behavior: Behavior normal.        Thought Content: Thought content normal.        Judgment: Judgment normal.    Lab Results  Component Value Date   TSH 0.442 (L) 01/28/2016   Lab Results  Component Value Date   WBC 1.7 (L) 05/20/2021   HGB 8.8 (L) 05/20/2021   HCT 29.6 (L) 05/20/2021   MCV 78.3 (L) 05/20/2021   PLT 47 (L) 05/20/2021   Lab Results  Component Value Date   NA 138 05/19/2021   K 3.4 (L) 05/19/2021   CO2 24 05/19/2021   GLUCOSE 117 (H) 05/19/2021   BUN 7 (L) 05/19/2021   CREATININE 0.53 05/19/2021   BILITOT 0.9 05/19/2021   ALKPHOS 69 05/19/2021   AST 24 05/19/2021   ALT 14 05/19/2021   PROT 6.4 (L) 05/19/2021   ALBUMIN 2.2 (L) 05/19/2021   CALCIUM 8.0 (L) 05/19/2021   ANIONGAP 7 05/19/2021   EGFR 100 05/08/2021   Lab Results  Component Value Date   CHOL 149 05/08/2021   Lab Results  Component Value Date   HDL 32 (L) 05/08/2021   Lab Results  Component Value Date   LDLCALC 94 05/08/2021   Lab Results  Component Value Date   TRIG 126 05/08/2021   Lab Results  Component Value Date   CHOLHDL 4.7 (H) 05/08/2021   Lab Results  Component Value Date   HGBA1C 6.9 (H) 05/08/2021

## 2021-06-01 ENCOUNTER — Encounter: Payer: Self-pay | Admitting: Family Medicine

## 2021-06-02 ENCOUNTER — Other Ambulatory Visit: Payer: Self-pay | Admitting: Family Medicine

## 2021-06-02 DIAGNOSIS — E559 Vitamin D deficiency, unspecified: Secondary | ICD-10-CM

## 2021-06-02 LAB — CMP14+EGFR
ALT: 16 IU/L (ref 0–32)
AST: 31 IU/L (ref 0–40)
Albumin/Globulin Ratio: 0.6 — ABNORMAL LOW (ref 1.2–2.2)
Albumin: 3 g/dL — ABNORMAL LOW (ref 3.8–4.8)
Alkaline Phosphatase: 151 IU/L — ABNORMAL HIGH (ref 44–121)
BUN/Creatinine Ratio: 16 (ref 12–28)
BUN: 8 mg/dL (ref 8–27)
Bilirubin Total: 0.7 mg/dL (ref 0.0–1.2)
CO2: 25 mmol/L (ref 20–29)
Calcium: 8.6 mg/dL — ABNORMAL LOW (ref 8.7–10.3)
Chloride: 106 mmol/L (ref 96–106)
Creatinine, Ser: 0.49 mg/dL — ABNORMAL LOW (ref 0.57–1.00)
Globulin, Total: 4.8 g/dL — ABNORMAL HIGH (ref 1.5–4.5)
Glucose: 257 mg/dL — ABNORMAL HIGH (ref 70–99)
Potassium: 4 mmol/L (ref 3.5–5.2)
Sodium: 142 mmol/L (ref 134–144)
Total Protein: 7.8 g/dL (ref 6.0–8.5)
eGFR: 105 mL/min/{1.73_m2} (ref >59)

## 2021-06-02 LAB — CBC WITH DIFFERENTIAL/PLATELET
Basophils Absolute: 0 10*3/uL (ref 0.0–0.2)
Basos: 1 %
EOS (ABSOLUTE): 0.1 10*3/uL (ref 0.0–0.4)
Eos: 3 %
Hematocrit: 34.2 % (ref 34.0–46.6)
Hemoglobin: 10 g/dL — ABNORMAL LOW (ref 11.1–15.9)
Immature Grans (Abs): 0 10*3/uL (ref 0.0–0.1)
Immature Granulocytes: 1 %
Lymphocytes Absolute: 1 10*3/uL (ref 0.7–3.1)
Lymphs: 35 %
MCH: 22.5 pg — ABNORMAL LOW (ref 26.6–33.0)
MCHC: 29.2 g/dL — ABNORMAL LOW (ref 31.5–35.7)
MCV: 77 fL — ABNORMAL LOW (ref 79–97)
Monocytes Absolute: 0.3 10*3/uL (ref 0.1–0.9)
Monocytes: 10 %
Neutrophils Absolute: 1.4 10*3/uL (ref 1.4–7.0)
Neutrophils: 50 %
Platelets: 70 10*3/uL — CL (ref 150–450)
RBC: 4.44 x10E6/uL (ref 3.77–5.28)
RDW: 19.7 % — ABNORMAL HIGH (ref 11.7–15.4)
WBC: 2.8 10*3/uL — ABNORMAL LOW (ref 3.4–10.8)

## 2021-06-02 LAB — VITAMIN A: Vitamin A: 7 ug/dL — ABNORMAL LOW (ref 22.0–69.5)

## 2021-06-02 LAB — VITAMIN B6: Vitamin B6: 4.6 ug/L (ref 3.4–65.2)

## 2021-06-02 LAB — VITAMIN D 25 HYDROXY (VIT D DEFICIENCY, FRACTURES): Vit D, 25-Hydroxy: 17.8 ng/mL — ABNORMAL LOW (ref 30.0–100.0)

## 2021-06-02 MED ORDER — VITAMIN D (ERGOCALCIFEROL) 1.25 MG (50000 UNIT) PO CAPS: 50000.0000 [IU] | ORAL_CAPSULE | ORAL | 0 refills | Status: DC

## 2021-06-03 ENCOUNTER — Telehealth: Payer: Self-pay | Admitting: Family Medicine

## 2021-06-03 DIAGNOSIS — Z79899 Other long term (current) drug therapy: Secondary | ICD-10-CM | POA: Diagnosis not present

## 2021-06-03 DIAGNOSIS — K7460 Unspecified cirrhosis of liver: Secondary | ICD-10-CM | POA: Diagnosis not present

## 2021-06-03 DIAGNOSIS — B181 Chronic viral hepatitis B without delta-agent: Secondary | ICD-10-CM | POA: Diagnosis not present

## 2021-06-03 DIAGNOSIS — R030 Elevated blood-pressure reading, without diagnosis of hypertension: Secondary | ICD-10-CM | POA: Diagnosis not present

## 2021-06-03 DIAGNOSIS — Z6826 Body mass index (BMI) 26.0-26.9, adult: Secondary | ICD-10-CM | POA: Diagnosis not present

## 2021-06-03 DIAGNOSIS — E119 Type 2 diabetes mellitus without complications: Secondary | ICD-10-CM | POA: Diagnosis not present

## 2021-06-04 NOTE — Telephone Encounter (Signed)
Spoke with patient, she contacted her insurance company and they gave her these four places that they work with to get her the gel overlay mattress for her twin size bed.  If you will place an order we can contact them to get this sent to  one of them.

## 2021-06-04 NOTE — Telephone Encounter (Signed)
Can send order to [facility] in [place].

## 2021-06-04 NOTE — Telephone Encounter (Signed)
NA  Where do we need to send rx to?

## 2021-06-04 NOTE — Telephone Encounter (Signed)
The order was already placed, we just needed to know where to send it.

## 2021-06-05 DIAGNOSIS — Z79899 Other long term (current) drug therapy: Secondary | ICD-10-CM | POA: Diagnosis not present

## 2021-06-09 NOTE — Telephone Encounter (Signed)
Do you have the order? I was not here when patient was seen so I do not have the order. Needs to be faxed

## 2021-06-09 NOTE — Telephone Encounter (Signed)
No. Placed on 02/28/2021 - maybe it can be reprinted from that encounter? If not, let me know and I will write it on prescription pad.

## 2021-06-11 ENCOUNTER — Other Ambulatory Visit: Payer: Self-pay

## 2021-06-11 ENCOUNTER — Encounter: Payer: Self-pay | Admitting: Internal Medicine

## 2021-06-11 ENCOUNTER — Ambulatory Visit: Payer: Medicare HMO | Admitting: Internal Medicine

## 2021-06-11 ENCOUNTER — Telehealth: Payer: Medicare HMO

## 2021-06-11 VITALS — BP 155/82 | HR 93 | Temp 97.9°F | Wt 126.0 lb

## 2021-06-11 DIAGNOSIS — M4644 Discitis, unspecified, thoracic region: Secondary | ICD-10-CM | POA: Diagnosis not present

## 2021-06-11 DIAGNOSIS — F1910 Other psychoactive substance abuse, uncomplicated: Secondary | ICD-10-CM | POA: Diagnosis not present

## 2021-06-11 DIAGNOSIS — Z5181 Encounter for therapeutic drug level monitoring: Secondary | ICD-10-CM

## 2021-06-11 NOTE — Assessment & Plan Note (Signed)
She seems to be improving overall and tolerating the antibiotics well.  At this point, will check her inflammatory markers for comparison (previously near normal - CRP 0.5 and ESR 32) to be sure no significant concerns.  She will continue with 6 weeks of treatment and follow up with me after that and will reassess clincially and with inflammatory markers.   ?

## 2021-06-11 NOTE — Assessment & Plan Note (Signed)
Will check a cmp on antibiotics ?

## 2021-06-11 NOTE — Assessment & Plan Note (Signed)
Doing well on suboxone.  No further drug use reported.  ?

## 2021-06-11 NOTE — Progress Notes (Signed)
? ?  Subjective:  ? ? Patient ID: Grace Stokes, female    DOB: [birth year], 66 y.o.   MRN: [id number] ? ?HPI ?Here for follow up of discitis/osteomyelitis with septic arthritis of the thoracic region.  ?She has a history of cirrhosis with chronic hepatitis B and had worsening back pain and MRI c/w discitis/osteomyelitis. She has chronic thrombocytopenia due to cirrhosis and disc aspiration not able to be performed.  She was started on empiric doxycycline and cefadroxil and has been on this about 3 weeks.  She feels better overall, some less pain.  No associated rash or diarrhea.  ? ? ?Review of Systems  ?Constitutional:  Negative for chills and fever.  ?Gastrointestinal:  Negative for diarrhea and nausea.  ?Skin:  Negative for rash.  ? ?   ?Objective:  ? Physical Exam ?Eyes:  ?   General: No scleral icterus. ?Pulmonary:  ?   Effort: Pulmonary effort is normal.  ?Skin: ?   Findings: No rash.  ?Neurological:  ?   Mental Status: She is alert.  ?Psychiatric:     ?   Mood and Affect: Mood normal.  ? ?SH: + tobacco ? ? ? ?   ?Assessment & Plan:  ? ? ?

## 2021-06-12 LAB — COMPREHENSIVE METABOLIC PANEL
AG Ratio: 0.6 (calc) — ABNORMAL LOW (ref 1.0–2.5)
ALT: 13 U/L (ref 6–29)
AST: 29 U/L (ref 10–35)
Albumin: 2.9 g/dL — ABNORMAL LOW (ref 3.6–5.1)
Alkaline phosphatase (APISO): 158 U/L — ABNORMAL HIGH (ref 37–153)
BUN: 9 mg/dL (ref 7–25)
CO2: 28 mmol/L (ref 20–32)
Calcium: 8.6 mg/dL (ref 8.6–10.4)
Chloride: 104 mmol/L (ref 98–110)
Creat: 0.58 mg/dL (ref 0.50–1.05)
Globulin: 4.9 g/dL (calc) — ABNORMAL HIGH (ref 1.9–3.7)
Glucose, Bld: 371 mg/dL — ABNORMAL HIGH (ref 65–99)
Potassium: 3.5 mmol/L (ref 3.5–5.3)
Sodium: 137 mmol/L (ref 135–146)
Total Bilirubin: 0.9 mg/dL (ref 0.2–1.2)
Total Protein: 7.8 g/dL (ref 6.1–8.1)

## 2021-06-12 LAB — C-REACTIVE PROTEIN: CRP: 1 mg/L (ref <8.0)

## 2021-06-12 LAB — SEDIMENTATION RATE: Sed Rate: 31 mm/h — ABNORMAL HIGH (ref 0–30)

## 2021-06-17 DIAGNOSIS — K769 Liver disease, unspecified: Secondary | ICD-10-CM | POA: Diagnosis not present

## 2021-06-17 DIAGNOSIS — Z6826 Body mass index (BMI) 26.0-26.9, adult: Secondary | ICD-10-CM | POA: Diagnosis not present

## 2021-06-17 DIAGNOSIS — Z0000 Encounter for general adult medical examination without abnormal findings: Secondary | ICD-10-CM | POA: Diagnosis not present

## 2021-06-17 DIAGNOSIS — Z7901 Long term (current) use of anticoagulants: Secondary | ICD-10-CM | POA: Diagnosis not present

## 2021-06-17 DIAGNOSIS — F1120 Opioid dependence, uncomplicated: Secondary | ICD-10-CM | POA: Diagnosis not present

## 2021-06-17 DIAGNOSIS — Z79899 Other long term (current) drug therapy: Secondary | ICD-10-CM | POA: Diagnosis not present

## 2021-06-17 DIAGNOSIS — E119 Type 2 diabetes mellitus without complications: Secondary | ICD-10-CM | POA: Diagnosis not present

## 2021-06-17 DIAGNOSIS — R030 Elevated blood-pressure reading, without diagnosis of hypertension: Secondary | ICD-10-CM | POA: Diagnosis not present

## 2021-06-17 DIAGNOSIS — K7460 Unspecified cirrhosis of liver: Secondary | ICD-10-CM | POA: Diagnosis not present

## 2021-06-17 DIAGNOSIS — F1110 Opioid abuse, uncomplicated: Secondary | ICD-10-CM | POA: Diagnosis not present

## 2021-06-17 DIAGNOSIS — E1165 Type 2 diabetes mellitus with hyperglycemia: Secondary | ICD-10-CM | POA: Diagnosis not present

## 2021-06-19 DIAGNOSIS — Z79899 Other long term (current) drug therapy: Secondary | ICD-10-CM | POA: Diagnosis not present

## 2021-06-23 DIAGNOSIS — H35033 Hypertensive retinopathy, bilateral: Secondary | ICD-10-CM | POA: Diagnosis not present

## 2021-06-23 DIAGNOSIS — E119 Type 2 diabetes mellitus without complications: Secondary | ICD-10-CM | POA: Diagnosis not present

## 2021-06-23 DIAGNOSIS — Z0100 Encounter for examination of eyes and vision without abnormal findings: Secondary | ICD-10-CM | POA: Diagnosis not present

## 2021-06-23 DIAGNOSIS — H5210 Myopia, unspecified eye: Secondary | ICD-10-CM | POA: Diagnosis not present

## 2021-06-23 LAB — HM DIABETES EYE EXAM

## 2021-06-24 DIAGNOSIS — K7469 Other cirrhosis of liver: Secondary | ICD-10-CM | POA: Diagnosis not present

## 2021-06-24 DIAGNOSIS — B181 Chronic viral hepatitis B without delta-agent: Secondary | ICD-10-CM | POA: Diagnosis not present

## 2021-06-24 DIAGNOSIS — I8510 Secondary esophageal varices without bleeding: Secondary | ICD-10-CM | POA: Insufficient documentation

## 2021-06-24 DIAGNOSIS — F1910 Other psychoactive substance abuse, uncomplicated: Secondary | ICD-10-CM | POA: Diagnosis not present

## 2021-06-25 ENCOUNTER — Telehealth: Payer: Medicare HMO

## 2021-07-01 DIAGNOSIS — Z6826 Body mass index (BMI) 26.0-26.9, adult: Secondary | ICD-10-CM | POA: Diagnosis not present

## 2021-07-01 DIAGNOSIS — B181 Chronic viral hepatitis B without delta-agent: Secondary | ICD-10-CM | POA: Diagnosis not present

## 2021-07-01 DIAGNOSIS — R030 Elevated blood-pressure reading, without diagnosis of hypertension: Secondary | ICD-10-CM | POA: Diagnosis not present

## 2021-07-01 DIAGNOSIS — E119 Type 2 diabetes mellitus without complications: Secondary | ICD-10-CM | POA: Diagnosis not present

## 2021-07-01 DIAGNOSIS — F1120 Opioid dependence, uncomplicated: Secondary | ICD-10-CM | POA: Diagnosis not present

## 2021-07-01 DIAGNOSIS — Z79899 Other long term (current) drug therapy: Secondary | ICD-10-CM | POA: Diagnosis not present

## 2021-07-02 ENCOUNTER — Ambulatory Visit (INDEPENDENT_AMBULATORY_CARE_PROVIDER_SITE_OTHER): Payer: Medicare HMO | Admitting: *Deleted

## 2021-07-02 DIAGNOSIS — M4644 Discitis, unspecified, thoracic region: Secondary | ICD-10-CM

## 2021-07-02 DIAGNOSIS — E1165 Type 2 diabetes mellitus with hyperglycemia: Secondary | ICD-10-CM

## 2021-07-02 DIAGNOSIS — I10 Essential (primary) hypertension: Secondary | ICD-10-CM

## 2021-07-02 NOTE — Chronic Care Management (AMB) (Signed)
?Chronic Care Management  ? ?CCM RN Visit Note ? ?07/02/2021 ?Name: Grace Stokes MRN: [id number] DOB: Jun 21, 1955 ? ?Subjective: ?Grace Stokes is a 66 y.o. year old female who is a primary care patient of Loman Brooklyn, FNP. The care management team was consulted for assistance with disease management and care coordination needs.   ? ?Engaged with patient by telephone for follow up visit in response to provider referral for case management and/or care coordination services.  ? ?Consent to Services:  ?The patient was given information about Chronic Care Management services, agreed to services, and gave verbal consent prior to initiation of services.  Please see initial visit note for detailed documentation.  ? ?Patient agreed to services and verbal consent obtained.  ? ?Assessment: Review of patient past medical history, allergies, medications, health status, including review of consultants reports, laboratory and other test data, was performed as part of comprehensive evaluation and provision of chronic care management services.  ? ?SDOH (Social Determinants of Health) assessments and interventions performed:   ? ?CCM Care Plan ? ?Allergies  ?Allergen Reactions  ? Gabapentin Other (See Comments)  ?  Uncontrolled shakes / cold intolerance   ? ? ?Outpatient Encounter Medications as of 07/02/2021  ?Medication Sig Note  ? Buprenorphine HCl-Naloxone HCl 4-1 MG FILM Place 1 strip under the tongue 2 (two) times daily.   ? cefadroxil (DURICEF) 500 MG capsule Take 100 mg by mouth 2 (two) times daily.   ? doxycycline (VIBRA-TABS) 100 MG tablet Take 100 mg by mouth 2 (two) times daily.   ? Buprenorphine HCl-Naloxone HCl 2-0.5 MG FILM Place 1 Film under the tongue in the morning and at bedtime. (Patient not taking: Reported on 07/02/2021)   ? Dulaglutide (TRULICITY) 1.5 VV/6.1YW SOPN Inject 1.5 mg into the skin once a week. (Patient taking differently: Inject 1.5 mg into the skin every Sunday.) 03/21/2021: VIA [facility] PATIENT ASSISTANCE  ? entecavir (BARACLUDE) 1 MG tablet Take 1 mg by mouth at bedtime. 05/15/2021: Filled at mail order pharmacy per pt (unknown name)  ? Evolocumab (REPATHA SURECLICK) 737 MG/ML SOAJ Inject 140 mg into the skin every 14 (fourteen) days. (Patient taking differently: Inject 140 mg into the skin every 14 (fourteen) days. Every other Monday) 05/15/2021: New medication - one dose 05/12/21  ? Hydroactive Dressings (TEGADERM HYDROCOLLOID THIN) MISC Apply 1 each topically every other day. (Patient taking differently: Apply 1 each topically See admin instructions. Apply topically to sore on buttocks every other day)   ? lisinopril (ZESTRIL) 20 MG tablet Take 1 tablet (20 mg total) by mouth daily. (Patient taking differently: Take 20 mg by mouth at bedtime.)   ? metoprolol succinate (TOPROL-XL) 100 MG 24 hr tablet Take 1 tablet (100 mg total) by mouth daily. (Patient taking differently: Take 100 mg by mouth at bedtime.)   ? mirtazapine (REMERON) 15 MG tablet Take 1 tablet (15 mg total) by mouth at bedtime.   ? Vitamin D, Ergocalciferol, (DRISDOL) 1.25 MG (50000 UNIT) CAPS capsule Take 1 capsule (50,000 Units total) by mouth every 7 (seven) days.   ? ?No facility-administered encounter medications on file as of 07/02/2021.  ? ? ?Patient Active Problem List  ? Diagnosis Date Noted  ? Medication monitoring encounter 06/11/2021  ? Malnutrition of moderate degree 05/17/2021  ? ABLA (acute blood loss anemia) 05/16/2021  ? Acute GI bleeding 05/15/2021  ? Diabetes mellitus type 2 in nonobese ([place]) 05/15/2021  ? Chronic pain 05/15/2021  ? Depression 05/15/2021  ?  CAD (coronary artery disease) 05/15/2021  ? IV drug abuse ([place]) 05/15/2021  ? Discitis of thoracic region 05/15/2021  ? DNR (do not resuscitate) 05/15/2021  ? Pressure injury of left buttock, stage 2 ([place]) 05/11/2021  ? Aortic atherosclerosis ([place]) 02/18/2021  ? Moderate episode of recurrent major depressive disorder ([place]) 02/18/2021  ? Tachycardia 02/18/2021   ? Statins contraindicated 01/16/2021  ? Other cirrhosis of liver ([place]) 10/23/2020  ? History of drug abuse ([place]) 10/23/2020  ? Mixed hyperlipidemia 04/18/2019  ? Other pancytopenia ([place]) 03/02/2019  ? Thrombocytopenia ([place])   ? Insomnia due to medical condition 09/22/2018  ? Chronic active viral hepatitis B ([place]) 01/26/2017  ? Uncontrolled type 2 diabetes mellitus with hyperglycemia ([place]) 01/13/2017  ? Essential hypertension 12/17/2016  ? Heart murmur, systolic 62/13/0865  ? Smoker 08/26/2016  ? Idiopathic scoliosis 01/28/2016  ? Cervical spondylosis with myelopathy 07/08/2013  ? ? ?Conditions to be addressed/monitored:HTN, DMII, and infection ? ?Care Plan : [facility] Care Plan  ?Updates made by Ilean China, RN since 07/02/2021 12:00 AM  ?  ? ?Problem: Chronic Disease Management Needs   ?Priority: High  ?Onset Date: 02/17/2021  ?  ? ?Long-Range Goal: Work with Consulting civil engineer Regarding Care Management and [place] with Cirrhosis, Hypertension and Diabetes   ?Start Date: 02/17/2021  ?Expected End Date: 02/17/2022  ?This Visit's Progress: On track  ?Recent Progress: On track  ?Priority: High  ?Note:   ?Current Barriers:  ?Care Coordination needs related to Financial constraints related to medication cost  ?Chronic Disease Management support and education needs related to HTN, DMII, and cirrhosis ? ?RNCM Clinical Goal(s):  ?Patient will continue to work with RN Care Manager and/or Social Worker to address care management and care coordination needs related to HTN, DMII, and cirrhosis as evidenced by adherence to CM Team Scheduled appointments     through collaboration with RN Care manager, provider, and care team.  ? ?Interventions: ?1:1 collaboration with primary care provider regarding development and update of comprehensive plan of care as evidenced by provider attestation and co-signature ?Inter-disciplinary care team collaboration (see longitudinal plan of care) ?Evaluation of current treatment  plan related to  self management and patient's adherence to plan as established by provider ?Provided with RN Care Manager contact information and encouraged to reach out as needed ?Assessed family/social support ?Discussed mobility and ability to perform ADLs ? ? ?Diabetes:  (Status: Goal on Track (progressing): YES.) Long Term Goal  ? ?Lab Results  ?Component Value Date  ? HGBA1C 6.9 (H) 05/08/2021  ? HGBA1C 13.0 (H) 01/16/2021  ? HGBA1C 12.9 (H) 11/09/2019  ? ?Lab Results  ?Component Value Date  ? [place] 94 05/08/2021  ? CREATININE 0.58 06/11/2021  ?Assessed patient's understanding of A1c goal: <7% ?Provided education to patient about basic DM disease process; ?Reviewed medications with patient and discussed importance of medication adherence;        ?Counseled on importance of regular laboratory monitoring as prescribed;        ?Review of patient status, including review of consultants reports, relevant laboratory and other test results, and medications completed;       ?Assessed social determinant of health barriers;        ?Discussed medication cost and assistance through [employer]. Provided with telephone number [phone] to call for refill if needed ?Reviewed upcoming appointments ?Discussed improvement in A1C and patient's happiness that it has improved. Provided praise and encouragement.  ? ? ?Hypertension: (Status: Goal on Track (progressing): YES.) ?Last  practice recorded BP readings:  ?BP Readings from Last 3 Encounters:  ?06/11/21 (!) 155/82  ?05/29/21 128/73  ?05/20/21 108/61  ?Reviewed medications with patient and discussed importance of compliance;  ?Counseled on the importance of exercise goals with target of 150 minutes per week ?Advised patient, providing education and rationale, to monitor blood pressure daily and record, calling PCP for findings outside established parameters;  ?Discussed complications of poorly controlled blood pressure such as heart disease, stroke, circulatory  complications, vision complications, kidney impairment, sexual dysfunction;  ?Assessed social determinant of health barriers;  ? ? ?SDOH Barriers (Status: Goal on Track (progressing): YES.) Long Term Goal  ?

## 2021-07-02 NOTE — Patient Instructions (Signed)
Visit Information ? ?Patient Goals/Self-Care Activities: ?Patient will self administer medications as prescribed as evidenced by self report/primary caregiver report  ?Patient will attend all scheduled provider appointments as evidenced by clinician review of documented attendance to scheduled appointments and patient/caregiver report ?Patient will continue to perform ADL's independently as evidenced by patient/caregiver report ?Patient will call provider office for new concerns or questions as evidenced by review of documented incoming telephone call notes and patient report ?check blood sugar at prescribed times: once daily ?check feet daily for cuts, sores or redness ?read food labels for fat, fiber, carbohydrates and portion size ?- check blood pressure 3 times per week ?- write blood pressure results in a log or diary ?- take blood pressure log to all doctor appointments ?- eat more whole grains, fruits and vegetables, lean meats and healthy fats ?Call RN Care Manager as needed [phone] ?Call infectious disease with any new or worsening symptoms ?Call pain management provider for any new or worsening pain ?Call [facility] for Trulicity refill if not on automatic refills [phone] ? ?Patient verbalizes understanding of instructions and care plan provided today and agrees to view in [place]. Active MyChart status confirmed with patient.   ? ?Plan:Telephone follow up appointment with care management team member scheduled for:  09/01/21 with RNCM ?The patient has been provided with contact information for the care management team and has been advised to call with any health related questions or concerns.  ? ?Chong Sicilian, BSN, RN-BC ?Embedded Chronic Care Manager ?[place] [facility] [place] [facility] ?Direct Dial: [phone] ?  ?

## 2021-07-03 ENCOUNTER — Encounter: Payer: Self-pay | Admitting: Internal Medicine

## 2021-07-03 DIAGNOSIS — Z79899 Other long term (current) drug therapy: Secondary | ICD-10-CM | POA: Diagnosis not present

## 2021-07-10 ENCOUNTER — Other Ambulatory Visit: Payer: Self-pay

## 2021-07-10 ENCOUNTER — Ambulatory Visit: Payer: Medicare HMO | Admitting: Licensed Clinical Social Worker

## 2021-07-10 ENCOUNTER — Encounter: Payer: Self-pay | Admitting: Internal Medicine

## 2021-07-10 ENCOUNTER — Ambulatory Visit: Payer: Medicare HMO | Admitting: Internal Medicine

## 2021-07-10 VITALS — BP 147/82 | HR 85 | Temp 98.8°F

## 2021-07-10 DIAGNOSIS — D61818 Other pancytopenia: Secondary | ICD-10-CM

## 2021-07-10 DIAGNOSIS — Z5941 Food insecurity: Secondary | ICD-10-CM

## 2021-07-10 DIAGNOSIS — E782 Mixed hyperlipidemia: Secondary | ICD-10-CM

## 2021-07-10 DIAGNOSIS — M4644 Discitis, unspecified, thoracic region: Secondary | ICD-10-CM

## 2021-07-10 DIAGNOSIS — G894 Chronic pain syndrome: Secondary | ICD-10-CM

## 2021-07-10 DIAGNOSIS — E1165 Type 2 diabetes mellitus with hyperglycemia: Secondary | ICD-10-CM

## 2021-07-10 DIAGNOSIS — F331 Major depressive disorder, recurrent, moderate: Secondary | ICD-10-CM

## 2021-07-10 DIAGNOSIS — I10 Essential (primary) hypertension: Secondary | ICD-10-CM

## 2021-07-10 DIAGNOSIS — K7469 Other cirrhosis of liver: Secondary | ICD-10-CM

## 2021-07-10 NOTE — Patient Instructions (Addendum)
Visit Information ? ?Patient Goals:  Manage emotions. Manage depression issues. Manage stress issues ? ?Timeframe:  Short-Term Goal ?Priority:  Medium  ?Progress: On Track ?Start Date:             07/10/21                ?Expected End Date:           10/07/21             ? ?Follow Up Date 09/02/21 at 10:00 AM ?  ?Manage emotions; manage depression issues; manage stress issues  ?  ?Why is this important?   ?When you are stressed, down or upset, your body reacts too.  ?For example, your blood pressure may get higher; you may have a headache or stomachache.  ?When your emotions get the best of you, your body's ability to fight off cold and flu gets weak.  ?These steps will help you manage your emotions.    ? ?Patient Self Care Activities:  ?Self administers medications as prescribed ?Attends all scheduled provider appointments ?Performs ADL's independently ?Performs IADL's independently ? ?Patient Coping Strengths:  ?Family ?Friends ? ?Patient Self Care Deficits:  ?Mobility issues ?Pain issues ?Food procurement challenges ?Transport challenges ? ?Patient Goals:  ?- spend time or talk with others at least 2 to 3 times per week ?- practice relaxation or meditation daily ?- keep a calendar with appointment dates ? ?Follow Up Plan:  LCSW to call client or spouse of client on 09/02/21 at 10:00 AM to assess client needs.  ? ?Norva Riffle.Maxamillion Banas MSW, LCSW ?Licensed Clinical Social Worker ?[facility] ?[phone] ?

## 2021-07-10 NOTE — Assessment & Plan Note (Signed)
Clinically improved/stable and no new concerns.  Now off of antibiotics and will observe off.  If no change, she can follow up as needed. ?Will recheck ESR and CRP today ?

## 2021-07-10 NOTE — Assessment & Plan Note (Signed)
She is followed by a pain clinic and will continue ?

## 2021-07-10 NOTE — Chronic Care Management (AMB) (Signed)
?Chronic Care Management  ? ? Clinical Social Work Note ? ?07/10/2021 ?Name: Grace Stokes MRN: [id number] DOB: 1956/03/27 ? ?Grace Stokes is a 66 y.o. year old female who is a primary care patient of Loman Brooklyn, FNP. The CCM team was consulted to assist the patient with chronic disease management and/or care coordination needs related to: [employer] .  ? ?Engaged with patient by telephone for follow up visit in response to provider referral for social work chronic care management and care coordination services.  ? ?Consent to Services:  ?The patient was given information about Chronic Care Management services, agreed to services, and gave verbal consent prior to initiation of services.  Please see initial visit note for detailed documentation.  ? ?Patient agreed to services and consent obtained.  ? ?Assessment: Review of patient past medical history, allergies, medications, and health status, including review of relevant consultants reports was performed today as part of a comprehensive evaluation and provision of chronic care management and care coordination services.    ? ?SDOH (Social Determinants of Health) assessments and interventions performed:  ?SDOH Interventions   ? ?Flowsheet Row Most Recent Value  ?SDOH Interventions   ?Physical Activity Interventions Other (Comments)  [walking challenges. she uses a cane or a walker to help her walk]  ?Stress Interventions Provide Counseling  [client has stress related to difficulty sleeping. has stress related to financial issues]  ?Depression Interventions/Treatment  Counseling  ? ?  ?  ? ?Advanced Directives Status: See Vynca application for related entries. ? ?CCM Care Plan ? ?Allergies  ?Allergen Reactions  ? Gabapentin Other (See Comments)  ?  Uncontrolled shakes / cold intolerance   ? ? ?Outpatient Encounter Medications as of 07/10/2021  ?Medication Sig Note  ? Buprenorphine HCl-Naloxone HCl 2-0.5 MG FILM Place 1 Film under the tongue  in the morning and at bedtime. (Patient not taking: Reported on 07/02/2021)   ? Buprenorphine HCl-Naloxone HCl 4-1 MG FILM Place 1 strip under the tongue 2 (two) times daily.   ? cefadroxil (DURICEF) 500 MG capsule Take 100 mg by mouth 2 (two) times daily.   ? doxycycline (VIBRA-TABS) 100 MG tablet Take 100 mg by mouth 2 (two) times daily.   ? Dulaglutide (TRULICITY) 1.5 TL/5.7WI SOPN Inject 1.5 mg into the skin once a week. (Patient taking differently: Inject 1.5 mg into the skin every Sunday.) 03/21/2021: VIA [facility] PATIENT ASSISTANCE  ? entecavir (BARACLUDE) 1 MG tablet Take 1 mg by mouth at bedtime. 05/15/2021: Filled at mail order pharmacy per pt (unknown name)  ? Evolocumab (REPATHA SURECLICK) 203 MG/ML SOAJ Inject 140 mg into the skin every 14 (fourteen) days. (Patient taking differently: Inject 140 mg into the skin every 14 (fourteen) days. Every other Monday) 05/15/2021: New medication - one dose 05/12/21  ? Hydroactive Dressings (TEGADERM HYDROCOLLOID THIN) MISC Apply 1 each topically every other day. (Patient taking differently: Apply 1 each topically See admin instructions. Apply topically to sore on buttocks every other day)   ? lisinopril (ZESTRIL) 20 MG tablet Take 1 tablet (20 mg total) by mouth daily. (Patient taking differently: Take 20 mg by mouth at bedtime.)   ? metoprolol succinate (TOPROL-XL) 100 MG 24 hr tablet Take 1 tablet (100 mg total) by mouth daily. (Patient taking differently: Take 100 mg by mouth at bedtime.)   ? mirtazapine (REMERON) 15 MG tablet Take 1 tablet (15 mg total) by mouth at bedtime.   ? Vitamin D, Ergocalciferol, (DRISDOL) 1.25 MG (50000 UNIT) CAPS  capsule Take 1 capsule (50,000 Units total) by mouth every 7 (seven) days.   ? ?No facility-administered encounter medications on file as of 07/10/2021.  ? ? ?Patient Active Problem List  ? Diagnosis Date Noted  ? Medication monitoring encounter 06/11/2021  ? Malnutrition of moderate degree 05/17/2021  ? ABLA (acute blood loss  anemia) 05/16/2021  ? Acute GI bleeding 05/15/2021  ? Diabetes mellitus type 2 in nonobese ([place]) 05/15/2021  ? Chronic pain 05/15/2021  ? Depression 05/15/2021  ? CAD (coronary artery disease) 05/15/2021  ? IV drug abuse ([place]) 05/15/2021  ? Discitis of thoracic region 05/15/2021  ? DNR (do not resuscitate) 05/15/2021  ? Pressure injury of left buttock, stage 2 ([place]) 05/11/2021  ? Aortic atherosclerosis ([place]) 02/18/2021  ? Moderate episode of recurrent major depressive disorder ([place]) 02/18/2021  ? Tachycardia 02/18/2021  ? Statins contraindicated 01/16/2021  ? Other cirrhosis of liver ([place]) 10/23/2020  ? History of drug abuse ([place]) 10/23/2020  ? Mixed hyperlipidemia 04/18/2019  ? Other pancytopenia ([place]) 03/02/2019  ? Thrombocytopenia ([place])   ? Insomnia due to medical condition 09/22/2018  ? Chronic active viral hepatitis B ([place]) 01/26/2017  ? Uncontrolled type 2 diabetes mellitus with hyperglycemia ([place]) 01/13/2017  ? Essential hypertension 12/17/2016  ? Heart murmur, systolic 41/58/3094  ? Smoker 08/26/2016  ? Idiopathic scoliosis 01/28/2016  ? Cervical spondylosis with myelopathy 07/08/2013  ? ? ?Conditions to be addressed/monitored: monitor client management of depression issues ? ?Care Plan : LCSW Care Plan  ?Updates made by Katha Cabal, LCSW since 07/10/2021 12:00 AM  ?  ? ?Problem: Emotional Distress   ?  ? ?Goal: Emotional Health Supported;manage stress issues; manage depression issues   ?Start Date: 07/10/2021  ?Expected End Date: 10/07/2021  ?This Visit's Progress: On track  ?Recent Progress: Not on track  ?Priority: Medium  ?Note:   ?Current Barriers:  ?Chronic Mental Health needs related to depression and stress issues ?Mobility issues ?Pain issues ?Challenges in managing Diabetes ?Suicidal Ideation/Homicidal Ideation: No ? ?Clinical Social Work Goal(s):  ?patient will work with SW monthly by telephone or in person to reduce or manage symptoms related to depression issues ?LCSW will communicate with  client in next 30 days to discuss anxiety and stress issues faced by client ?LCSW will talk with client in next 30 days to discuss financial challenges of client ?Client to call RNCM as needed in next 30 days to discuss nursing needs of client ? ?Interventions: ?1:1 collaboration with Loman Brooklyn, FNP regarding development and update of comprehensive plan of care as evidenced by provider attestation and co-signature ?Discussed client needs with Hulda Humphrey ?Discussed pain issues of client. Client spoke of back pain issues. She said she takes a prescribed pain medication daily. She also goes one time per month to [facility] ?Discussed client completion of ADLs. She said she has a walk in shower and it has a seat. This is very helpful to her in bathing ?Discussed medication procurement for client ?Provided counseling support for client ?Discussed transport needs of client.   ?Discussed food needs of client. She said she had been getting frozen meals from [employer]. She has not been getting these meals recently. She plans to call [employer] to ask if they can start sending these frozen meals to her again. ?Discussed her recent hospital stay.  She is concerned over bill from hospital.  LCSW informed Grace Stokes that she could call and talk with financial counselor through [facility] to discuss options for  paying back hospital bill owed. ?Reviewed upcoming medical appointments for client ?Discussed ambulation of client. She said she uses a cane or walker as needed to help her walk.    ?Encouraged client to call RNCM as needed in next 30 days for nursing support ? ?Patient Self Care Activities:  ?Self administers medications as prescribed ?Attends all scheduled provider appointments ?Performs ADL's independently ?Performs IADL's independently ? ?Patient Coping Strengths:  ?Family ?Friends ? ?Patient Self Care Deficits:  ?Mobility issues ?Pain issues ?Food procurement challenges ?Transport  challenges ? ?Patient Goals:  ?- spend time or talk with others at least 2 to 3 times per week ?- practice relaxation or meditation daily ?- keep a calendar with appointment dates ? ?Follow Up Plan:  LCSW to call client or T

## 2021-07-10 NOTE — Progress Notes (Signed)
? ?  Subjective:  ? ? Patient ID: Grace Stokes, female    DOB: 11/11/1955, 66 y.o.   MRN: [id number] ? ?HPI ?Here for follow up of discitis/osteomyelitis with septic arthritis of the thoracic region.  ?She has a history of cirrhosis with chronic hepatitis B and had worsening back pain and MRI c/w discitis/osteomyelitis. She has chronic thrombocytopenia due to cirrhosis and disc aspiration not able to be performed.  She was started on empiric doxycycline and cefadroxil and has completed 6 weeks of antibiotics. Her  back pain is stable.  She has continued issues with movement due to the pain and is followed by a pain clinic.  Last ESR 31 and CRP 1.0.   ? ? ?Review of Systems  ?Constitutional:  Negative for chills.  ?Gastrointestinal:  Negative for diarrhea.  ?Skin:  Negative for rash.  ? ?   ?Objective:  ? Physical Exam ?Eyes:  ?   General: No scleral icterus. ?Pulmonary:  ?   Effort: Pulmonary effort is normal.  ?Skin: ?   Findings: No rash.  ?Neurological:  ?   Mental Status: She is alert.  ?Psychiatric:     ?   Mood and Affect: Mood normal.  ? ?SH: + tobacco ? ? ? ?   ?Assessment & Plan:  ? ? ?

## 2021-07-11 DIAGNOSIS — I10 Essential (primary) hypertension: Secondary | ICD-10-CM

## 2021-07-11 DIAGNOSIS — E1165 Type 2 diabetes mellitus with hyperglycemia: Secondary | ICD-10-CM

## 2021-07-11 LAB — C-REACTIVE PROTEIN: CRP: 0.8 mg/L (ref <8.0)

## 2021-07-11 LAB — SEDIMENTATION RATE: Sed Rate: 31 mm/h — ABNORMAL HIGH (ref 0–30)

## 2021-07-25 ENCOUNTER — Telehealth: Payer: Medicare HMO

## 2021-07-29 DIAGNOSIS — R030 Elevated blood-pressure reading, without diagnosis of hypertension: Secondary | ICD-10-CM | POA: Diagnosis not present

## 2021-07-29 DIAGNOSIS — Z6826 Body mass index (BMI) 26.0-26.9, adult: Secondary | ICD-10-CM | POA: Diagnosis not present

## 2021-07-29 DIAGNOSIS — Z79899 Other long term (current) drug therapy: Secondary | ICD-10-CM | POA: Diagnosis not present

## 2021-07-29 DIAGNOSIS — F1120 Opioid dependence, uncomplicated: Secondary | ICD-10-CM | POA: Diagnosis not present

## 2021-08-01 DIAGNOSIS — Z79899 Other long term (current) drug therapy: Secondary | ICD-10-CM | POA: Diagnosis not present

## 2021-08-04 ENCOUNTER — Other Ambulatory Visit: Payer: Self-pay | Admitting: Family Medicine

## 2021-08-04 DIAGNOSIS — I10 Essential (primary) hypertension: Secondary | ICD-10-CM

## 2021-08-07 ENCOUNTER — Encounter: Payer: Medicare HMO | Admitting: Family Medicine

## 2021-08-09 NOTE — Progress Notes (Signed)
? ?Referring Provider: Roosevelt Locks, NP  ?Primary Care Physician:  Loman Brooklyn, FNP ?Primary Gastroenterologist:  Dr. Abbey Chatters ? ?Chief Complaint  ?Patient presents with  ? Cirrhosis  ? ? ?HPI:   ?Grace Stokes is a 66 y.o. female presenting today at the request of Roosevelt Locks, NP for consult EGD for esophageal variceal screening.  ? ?History of chronic Hep B infection and cirrhosis following with Roosevelt Locks, NP, DM, HTN, IVDA with fentanyl ? ? ?Admitted in February 2023 at [facility] with anemia in the setting of rectal bleeding which she reports has been going on for 6 months.  Evidence of paraesophageal and splenic varices, rectal and perirectal varices on CT.  She was transfused 2 units PRBCs with hemoglobin improved to 8.8 from as low as 6.1.  Hemoglobin stabilized and bleeding was self-limited, no procedure was performed inpatient.  Also found to have possible thoracic discitis, unable to perform disc aspiration due to thrombocytopenia, and started on antibiotics with plans to follow-up outpatient. During her hositalization, she was also connected with pain management clinic and was started on Suboxone with good pain control and plans to refrain from illicit drug use for pain control.  ? ?Today: ? ?No abdominal distension. No LE edema. No mental status changes, jaundice. No easy bruising. No black stools. No abdominal pain. Rare reflux if eating spicy foods. Occasional solid food dysphagia which she attributes to not being able to chew well. Coughs items back up at times. If chopping items small, rare trouble. ? ?Before she went to the ED, for 6 months she was having small hard stools, and after a BM, she would start "gushing out blood". During hospital admission in February, she was treated with hydrocortisone suppositories and has had no further rectal bleeding. Trying to eat more fruit and vegetables which has helped with her bowel movements significantly.  She reports her stools are now Pollard  4 and moving daily to every other day. ? ?2 nights ago, she had some bleeding and isn't sure where it came from. Thinks it was vaginal bleeding, but also states she may have scratched herself as there was only a little blood on the tissue. Post menopausal close to 10 years. Has upcoming appointment with PCP this month.  ? ?Was taking fentanyl. Now on suboxone and almost 90 days clean.  ? ?Last colonoscopy in 2017 with [facility] with 1 polyp and external, and internal hemorrhoids.  Polyp was resected, but not received. ? ?Completed antibiotics for discitis.  Has been released from ID.  ? ?4 days ago, she  was having pain from right hip down. Has chronic back pain. Has had 4 lumbar laminectomy and spinal fusion.  ? ?Impaired synthetic function with mildly elevated INR 1.3 in February, pancytopenia, and albumin. ? ? ?Past Medical History:  ?Diagnosis Date  ? Arthritis   ? Bell's palsy   ? neuropathy   ? CAD (coronary artery disease)   ? Cataract   ? Chronic pain   ? Cirrhosis ([place])   ? Depression   ? Diabetes mellitus type 2 in nonobese [facility])   ? Hypertension   ? IV drug abuse ([place])   ? Scoliosis   ? ? ?Past Surgical History:  ?Procedure Laterality Date  ? ANTERIOR CERVICAL DECOMP/DISCECTOMY FUSION N/A 07/09/2013  ? Procedure: ANTERIOR CERVICAL DECOMPRESSION/DISCECTOMY FUSION 2 LEVELS;  Surgeon: Consuella Lose, MD;  Location: [place] [facility];  Service: Neurosurgery;  Laterality: N/A;  Anterior Cervical Fusion Cervical five-six, six-seven.  ?  BACK SURGERY    ? CARPAL TUNNEL RELEASE    ? CESAREAN SECTION    ? COLONOSCOPY  2017  ? [facility]; external and internal hemorrhoids, 1 small polyp in the descending colon resected, but not retrieved.  ? ELBOW SURGERY Left   ? I & D EXTREMITY Left 12/17/2016  ? Procedure: IRRIGATION AND DEBRIDEMENT LEFT HAND;  Surgeon: Iran Planas, MD;  Location: [place];  Service: Orthopedics;  Laterality: Left;  ? INCISION / DRAINAGE HAND / FINGER Left 12/17/2016  ? ROTATOR CUFF REPAIR    ?  SHOULDER SURGERY    ? TUBAL LIGATION    ? ? ?Current Outpatient Medications  ?Medication Sig Dispense Refill  ? Buprenorphine HCl-Naloxone HCl 2-0.5 MG FILM Place 1 Film under the tongue in the morning and at bedtime. 30 each 0  ? Buprenorphine HCl-Naloxone HCl 4-1 MG FILM Place 1 strip under the tongue 2 (two) times daily.    ? Dulaglutide (TRULICITY) 1.5 XK/4.8JE SOPN Inject 1.5 mg into the skin once a week. (Patient taking differently: Inject 1.5 mg into the skin every Sunday.) 2 mL 2  ? entecavir (BARACLUDE) 1 MG tablet Take 1 mg by mouth at bedtime.    ? lisinopril (ZESTRIL) 20 MG tablet Take 1 tablet (20 mg total) by mouth daily. (Patient taking differently: Take 20 mg by mouth at bedtime.) 90 tablet 1  ? metoprolol succinate (TOPROL-XL) 100 MG 24 hr tablet Take 1 tablet by mouth once daily 90 tablet 0  ? mirtazapine (REMERON) 15 MG tablet Take 1 tablet (15 mg total) by mouth at bedtime. 30 tablet 2  ? Vitamin D, Ergocalciferol, (DRISDOL) 1.25 MG (50000 UNIT) CAPS capsule Take 1 capsule (50,000 Units total) by mouth every 7 (seven) days. 8 capsule 0  ? cefadroxil (DURICEF) 500 MG capsule Take 100 mg by mouth 2 (two) times daily. (Patient not taking: Reported on 08/11/2021)    ? doxycycline (VIBRA-TABS) 100 MG tablet Take 100 mg by mouth 2 (two) times daily. (Patient not taking: Reported on 08/11/2021)    ? Evolocumab (REPATHA SURECLICK) 563 MG/ML SOAJ Inject 140 mg into the skin every 14 (fourteen) days. (Patient not taking: Reported on 08/11/2021) 2 mL 2  ? ?No current facility-administered medications for this visit.  ? ? ?Allergies as of 08/11/2021 - Review Complete 08/11/2021  ?Allergen Reaction Noted  ? Gabapentin Other (See Comments) 07/31/2019  ? ? ?Family History  ?Problem Relation Age of Onset  ? COPD Mother   ? Stroke Mother   ? Arthritis Brother   ?     back issues  ? Cancer Brother   ?     skin / back   ? Obesity Son   ? Colon cancer Neg Hx   ? ? ?Social History  ? ?Socioeconomic History  ? Marital  status: Married  ?  Spouse name: [name]  ? Number of children: 1  ? Years of education: Not on file  ? Highest education level: Not on file  ?Occupational History  ? Occupation: disability   ?Tobacco Use  ? Smoking status: Every Day  ?  Packs/day: 0.50  ?  Types: Cigarettes  ? Smokeless tobacco: Never  ?Vaping Use  ? Vaping Use: Never used  ?Substance and Sexual Activity  ? Alcohol use: Not Currently  ? Drug use: Not Currently  ?  Comment: Previously injected illicit Fentanyl.  Clean since February 2023.  ? Sexual activity: Not on file  ?Other Topics Concern  ? Not on  file  ?Social History Narrative  ? Son lives 15 minutes away and hasn't visited in over a year.  ? ?Social Determinants of Health  ? ?Financial Resource Strain: High Risk  ? Difficulty of Paying Living Expenses: Hard  ?Food Insecurity: Food Insecurity Present  ? Worried About Charity fundraiser in the Last Year: Sometimes true  ? Ran Out of Food in the Last Year: Sometimes true  ?Transportation Needs: No Transportation Needs  ? Lack of Transportation (Medical): No  ? Lack of Transportation (Non-Medical): No  ?Physical Activity: Inactive  ? Days of Exercise per Week: 0 days  ? Minutes of Exercise per Session: 0 min  ?Stress: Stress Concern Present  ? Feeling of Stress : Rather much  ?Social Connections: Moderately Isolated  ? Frequency of Communication with Friends and Family: More than three times a week  ? Frequency of Social Gatherings with Friends and Family: Twice a week  ? Attends Religious Services: Never  ? Active Member of Clubs or Organizations: No  ? Attends Archivist Meetings: Never  ? Marital Status: Married  ?Intimate Partner Violence: Not At Risk  ? Fear of Current or Ex-Partner: No  ? Emotionally Abused: No  ? Physically Abused: No  ? Sexually Abused: No  ? ? ?Review of Systems: ?Gen: Denies any fever, chills, cold or flulike symptoms, presyncope, syncope. ?CV: Denies chest pain, heart palpitations  ?Resp: Denies shortness of  breath or cough. ?GI: See HPI ?GU : Denies urinary burning, urinary frequency, urinary hesitancy ?MS: See HPI ?Derm: Denies rash ?Psych: Denies depression, anxiety ?Heme: See HPI ? ?Physical Exam: ?BP 120/74   Puls

## 2021-08-11 ENCOUNTER — Ambulatory Visit (INDEPENDENT_AMBULATORY_CARE_PROVIDER_SITE_OTHER): Payer: Medicare HMO | Admitting: Gastroenterology

## 2021-08-11 ENCOUNTER — Encounter: Payer: Self-pay | Admitting: Gastroenterology

## 2021-08-11 VITALS — BP 120/74 | HR 98 | Temp 97.5°F | Ht <= 58 in | Wt 120.0 lb

## 2021-08-11 DIAGNOSIS — K625 Hemorrhage of anus and rectum: Secondary | ICD-10-CM | POA: Insufficient documentation

## 2021-08-11 DIAGNOSIS — B181 Chronic viral hepatitis B without delta-agent: Secondary | ICD-10-CM | POA: Diagnosis not present

## 2021-08-11 DIAGNOSIS — K7460 Unspecified cirrhosis of liver: Secondary | ICD-10-CM

## 2021-08-11 DIAGNOSIS — K5900 Constipation, unspecified: Secondary | ICD-10-CM | POA: Diagnosis not present

## 2021-08-11 DIAGNOSIS — K5909 Other constipation: Secondary | ICD-10-CM | POA: Insufficient documentation

## 2021-08-11 DIAGNOSIS — R1310 Dysphagia, unspecified: Secondary | ICD-10-CM | POA: Diagnosis not present

## 2021-08-11 NOTE — Patient Instructions (Addendum)
Have blood work completed at [facility]  ? ?We will arrange to have a colonoscopy and upper endoscopy with possible stretching in the near future with Dr. Abbey Chatters. ? ?For mild constipation, you can take MiraLAX 1 capful (17 g) daily in 8 ounces of water if needed. ? ?Swallowing precautions:  ?Eat slowly, take small bites, chew thoroughly, drink plenty of liquids throughout meals.  ?Avoid trough textures ?All meats should be chopped finely.  ?If something gets hung in your esophagus and will not come up or go down, proceed to the emergency room.   ? ? ?We will see you back in the office after your procedures.  Do not hesitate to call if you have any questions or concerns prior to your next visit. ? ?It was a pleasure to meet you today!  ? ?Aliene Altes, PA-C ?[facility] ? ? ? ?

## 2021-08-12 LAB — CBC WITH DIFFERENTIAL/PLATELET
Absolute Monocytes: 278 cells/uL (ref 200–950)
Basophils Absolute: 19 cells/uL (ref 0–200)
Basophils Relative: 0.8 %
Eosinophils Absolute: 41 cells/uL (ref 15–500)
Eosinophils Relative: 1.7 %
HCT: 36.8 % (ref 35.0–45.0)
Hemoglobin: 11.9 g/dL (ref 11.7–15.5)
Lymphs Abs: 833 cells/uL — ABNORMAL LOW (ref 850–3900)
MCH: 27.6 pg (ref 27.0–33.0)
MCHC: 32.3 g/dL (ref 32.0–36.0)
MCV: 85.4 fL (ref 80.0–100.0)
Monocytes Relative: 11.6 %
Neutro Abs: 1229 cells/uL — ABNORMAL LOW (ref 1500–7800)
Neutrophils Relative %: 51.2 %
Platelets: 56 10*3/uL — ABNORMAL LOW (ref 140–400)
RBC: 4.31 10*6/uL (ref 3.80–5.10)
RDW: 15.5 % — ABNORMAL HIGH (ref 11.0–15.0)
Total Lymphocyte: 34.7 %
WBC: 2.4 10*3/uL — ABNORMAL LOW (ref 3.8–10.8)

## 2021-08-15 ENCOUNTER — Encounter: Payer: Self-pay | Admitting: *Deleted

## 2021-08-20 ENCOUNTER — Telehealth: Payer: Self-pay

## 2021-08-20 NOTE — Telephone Encounter (Signed)
Tried to call pt to schedule TCS/EGD/-/+DIL/-/+esophageal variceal banding ASA 3 w/Dr. Abbey Chatters, LMOVM for return call. ?

## 2021-08-22 ENCOUNTER — Other Ambulatory Visit: Payer: Self-pay | Admitting: Family Medicine

## 2021-08-22 DIAGNOSIS — F331 Major depressive disorder, recurrent, moderate: Secondary | ICD-10-CM

## 2021-08-22 DIAGNOSIS — G4701 Insomnia due to medical condition: Secondary | ICD-10-CM

## 2021-08-26 ENCOUNTER — Ambulatory Visit: Payer: Medicare HMO | Admitting: Family Medicine

## 2021-08-26 DIAGNOSIS — F1120 Opioid dependence, uncomplicated: Secondary | ICD-10-CM | POA: Diagnosis not present

## 2021-08-26 DIAGNOSIS — Z79899 Other long term (current) drug therapy: Secondary | ICD-10-CM | POA: Diagnosis not present

## 2021-08-26 DIAGNOSIS — Z6826 Body mass index (BMI) 26.0-26.9, adult: Secondary | ICD-10-CM | POA: Diagnosis not present

## 2021-08-26 DIAGNOSIS — R030 Elevated blood-pressure reading, without diagnosis of hypertension: Secondary | ICD-10-CM | POA: Diagnosis not present

## 2021-08-27 ENCOUNTER — Ambulatory Visit (INDEPENDENT_AMBULATORY_CARE_PROVIDER_SITE_OTHER): Payer: Medicare HMO | Admitting: Family Medicine

## 2021-08-27 ENCOUNTER — Encounter: Payer: Self-pay | Admitting: Family Medicine

## 2021-08-27 ENCOUNTER — Ambulatory Visit: Payer: Medicare HMO

## 2021-08-27 VITALS — BP 113/72 | HR 92 | Temp 98.3°F | Ht <= 58 in | Wt 127.0 lb

## 2021-08-27 DIAGNOSIS — L89322 Pressure ulcer of left buttock, stage 2: Secondary | ICD-10-CM

## 2021-08-27 DIAGNOSIS — I700 Atherosclerosis of aorta: Secondary | ICD-10-CM

## 2021-08-27 DIAGNOSIS — I10 Essential (primary) hypertension: Secondary | ICD-10-CM | POA: Diagnosis not present

## 2021-08-27 DIAGNOSIS — E782 Mixed hyperlipidemia: Secondary | ICD-10-CM

## 2021-08-27 DIAGNOSIS — E440 Moderate protein-calorie malnutrition: Secondary | ICD-10-CM

## 2021-08-27 DIAGNOSIS — E509 Vitamin A deficiency, unspecified: Secondary | ICD-10-CM | POA: Diagnosis not present

## 2021-08-27 DIAGNOSIS — Z23 Encounter for immunization: Secondary | ICD-10-CM

## 2021-08-27 DIAGNOSIS — F331 Major depressive disorder, recurrent, moderate: Secondary | ICD-10-CM

## 2021-08-27 DIAGNOSIS — E559 Vitamin D deficiency, unspecified: Secondary | ICD-10-CM | POA: Diagnosis not present

## 2021-08-27 DIAGNOSIS — M25551 Pain in right hip: Secondary | ICD-10-CM

## 2021-08-27 DIAGNOSIS — I2510 Atherosclerotic heart disease of native coronary artery without angina pectoris: Secondary | ICD-10-CM | POA: Diagnosis not present

## 2021-08-27 DIAGNOSIS — M545 Low back pain, unspecified: Secondary | ICD-10-CM

## 2021-08-27 DIAGNOSIS — G8929 Other chronic pain: Secondary | ICD-10-CM

## 2021-08-27 DIAGNOSIS — Z780 Asymptomatic menopausal state: Secondary | ICD-10-CM

## 2021-08-27 DIAGNOSIS — Z5309 Procedure and treatment not carried out because of other contraindication: Secondary | ICD-10-CM

## 2021-08-27 DIAGNOSIS — E1165 Type 2 diabetes mellitus with hyperglycemia: Secondary | ICD-10-CM

## 2021-08-27 LAB — BAYER DCA HB A1C WAIVED: HB A1C (BAYER DCA - WAIVED): 12.4 % — ABNORMAL HIGH (ref 4.8–5.6)

## 2021-08-27 MED ORDER — TRULICITY 3 MG/0.5ML ~~LOC~~ SOAJ: 3.0000 mg | SUBCUTANEOUS | 1 refills | Status: DC

## 2021-08-27 MED ORDER — METHYLPREDNISOLONE ACETATE 40 MG/ML IJ SUSP
60.0000 mg | Freq: Once | INTRAMUSCULAR | Status: AC
  Administered 2021-08-27: 60 mg via INTRAMUSCULAR

## 2021-08-27 MED ORDER — REPATHA SURECLICK 140 MG/ML ~~LOC~~ SOAJ: 140.0000 mg | SUBCUTANEOUS | 5 refills | Status: DC

## 2021-08-27 NOTE — Progress Notes (Signed)
Assessment & Plan:  1. Uncontrolled type 2 diabetes mellitus with hyperglycemia, without long-term current use of insulin (HCC) Lab Results  Component Value Date   HGBA1C 12.4 (H) 08/27/2021   HGBA1C 6.9 (H) 05/08/2021   HGBA1C 13.0 (H) 01/16/2021    - Diabetes is not at goal of A1c < 7. - Medications:  increase Trulicity from 1.5 mg to 3 mg weekly. - Home glucose monitoring: encouraged to monitor - Patient is not currently taking a statin, but we are trying to get Repatha routinely. Patient is taking an ACE-inhibitor/ARB.  - Instruction/counseling given: discussed diet  Diabetes Health Maintenance Due  Topic Date Due   FOOT EXAM  01/16/2022   HEMOGLOBIN A1C  02/27/2022   OPHTHALMOLOGY EXAM  06/24/2022    Lab Results  Component Value Date   LABMICR 4.6 01/16/2021   LABMICR See below: 12/11/2020   - Lipid panel - Bayer DCA Hb A1c Waived - Evolocumab (REPATHA SURECLICK) 782 MG/ML SOAJ; Inject 140 mg into the skin every 14 (fourteen) days.  Dispense: 2 mL; Refill: 5 - Dulaglutide (TRULICITY) 3 NF/6.2ZH SOPN; Inject 3 mg as directed once a week.  Dispense: 6 mL; Refill: 1  2. Essential hypertension Well controlled on current regimen.  - Lipid panel  3. Mixed hyperlipidemia Patient to resume Repatha. - Lipid panel - Evolocumab (REPATHA SURECLICK) 086 MG/ML SOAJ; Inject 140 mg into the skin every 14 (fourteen) days.  Dispense: 2 mL; Refill: 5  4. Aortic atherosclerosis ([place]) Patient to resume Repatha. - Lipid panel - Evolocumab (REPATHA SURECLICK) 578 MG/ML SOAJ; Inject 140 mg into the skin every 14 (fourteen) days.  Dispense: 2 mL; Refill: 5  5. Coronary artery disease involving native coronary artery of native heart without angina pectoris Patient to resume Repatha. - Lipid panel - Evolocumab (REPATHA SURECLICK) 469 MG/ML SOAJ; Inject 140 mg into the skin every 14 (fourteen) days.  Dispense: 2 mL; Refill: 5  6. Statins contraindicated - Evolocumab (REPATHA SURECLICK)  629 MG/ML SOAJ; Inject 140 mg into the skin every 14 (fourteen) days.  Dispense: 2 mL; Refill: 5  7. Vitamin D deficiency Labs to assess for improvement after weekly supplement. - VITAMIN D 25 Hydroxy (Vit-D Deficiency, Fractures)  8. Vitamin A deficiency Labs to assess for improvement after daily supplement. - Vitamin A  9. Malnutrition of moderate degree (HCC) - Vitamin A  10. Moderate episode of recurrent major depressive disorder (HCC) Well controlled on current regimen.   11. Pressure injury of left buttock, stage 2 (HCC) Gel mattress order being re-faxed.  12. Acute exacerbation of chronic low back pain - methylPREDNISolone acetate (DEPO-MEDROL) injection 60 mg - MR Lumbar Spine Wo Contrast; Future - For home use only DME Other see comment - For home use only DME Bedside commode - Ambulatory referral to Neurosurgery  13. Post-menopausal - DG [facility] DEXA; Future  14. Immunization due - Varicella-zoster vaccine IM (Shingrix)   Follow up plan: Return in about 3 months (around 11/27/2021) for follow-up of chronic medication conditions.  Hendricks Limes, MSN, APRN, FNP-C [facility]  Subjective:   Patient ID: Grace Stokes, female    DOB: Jul 21, 1955, 66 y.o.   MRN: [id number]  HPI: Grace Stokes is a 66 y.o. female presenting on 08/27/2021 for Medical Management of Chronic Issues and Hip Pain (Patient states that last 2 weeks she has been having right hip pain that goes to her right knee. Also states that she can not get that leg into the  bed that she will have to lift it up to put it in the bed. )  Hypertension: She does not check her blood pressure at home. She denies foot/ankle swelling, chest pain, palpitations, and heart rhythm irregularities.   Aortic Atherosclerosis: she is taking aspirin 81 mg daily. She cannot take statins due to active liver disease.  Repatha was previously approved through her insurance and she reports she had it  for 1 month, but then was unable to get further refills for unknown reasons.  The 10-year ASCVD risk score (Arnett DK, et al., 2019) is: 20.9%   Values used to calculate the score:     Age: 67 years     Sex: Female     Is Non-Hispanic African American: No     Diabetic: Yes     Tobacco smoker: Yes     Systolic Blood Pressure: 960 mmHg     Is BP treated: Yes     HDL Cholesterol: 32 mg/dL     Total Cholesterol: 149 mg/dL  Tachycardia: controlled with metoprolol.  Diabetes: Current symptoms include: hyperglycemia. Known diabetic complications: nephropathy and cardiovascular disease. Medication compliance: yes, Trulicity 1.5 mg weekly. Current diet:  she is getting food supplied now . Current exercise: none. Home blood sugar records: patient does not check sugars. Is she  on ACE inhibitor or angiotensin II receptor blocker? Yes (Lisinopril). Is she on a statin? No, contraindicated due to active liver disease.   Pressure injury:  patient has a pressure injury to her left buttock. At a previous visit a gel mattress overlay was ordered; she has not received this and needs the order to be faxed to [facility].  Depression: Taking Remeron to help with both depression and sleep. She reports she is sleeping really good now.     08/27/2021    2:11 PM 07/10/2021    3:14 PM 07/10/2021   10:53 AM  Depression screen PHQ 2/9  Decreased Interest 0 1 1  Down, Depressed, Hopeless '1 1 1  '$ PHQ - 2 Score '1 2 2  '$ Altered sleeping 2 2   Tired, decreased energy 2 1   Change in appetite 2 1   Feeling bad or failure about yourself  1 1   Trouble concentrating 0 1   Moving slowly or fidgety/restless 0 1   Suicidal thoughts 0 0   PHQ-9 Score 8 9   Difficult doing work/chores Somewhat difficult Somewhat difficult       08/27/2021    2:11 PM 05/29/2021    1:58 PM 05/08/2021    2:00 PM 02/28/2021   11:42 AM  GAD 7 : Generalized Anxiety Score  Nervous, Anxious, on Edge '1 1 1 1  '$ Control/stop worrying 1 1 0  1  Worry too much - different things 1 1 0 1  Trouble relaxing 1 1 0 0  Restless 0 0 0 0  Easily annoyed or irritable 1 2 0 1  Afraid - awful might happen 0 0 0 0  Total GAD 7 Score '5 6 1 4  '$ Anxiety Difficulty Somewhat difficult Somewhat difficult Somewhat difficult Somewhat difficult    New Complaints: Patient reports right-sided low back pain that is radiating into her right hip.  She has chronic back pain, but this has significantly worsened over the past 2 weeks.  She is unable to lift her right foot without physically pulling it up with her hands.  She is currently having a hard time getting into and out of of their  walk-in shower due to the step up.  She is also having a hard time getting to the restroom at night and is urinating in a bucket at the side of her bed.  She is interested in seeing what her treatment options are for her back.   ROS: Negative unless specifically indicated above in HPI.   Relevant past medical history reviewed and updated as indicated.   Allergies and medications reviewed and updated.   Current Outpatient Medications:    Buprenorphine HCl-Naloxone HCl 2-0.5 MG FILM, Place 1 Film under the tongue in the morning and at bedtime., Disp: 30 each, Rfl: 0   Buprenorphine HCl-Naloxone HCl 4-1 MG FILM, Place 1 strip under the tongue 2 (two) times daily., Disp: , Rfl:    cefadroxil (DURICEF) 500 MG capsule, Take 100 mg by mouth 2 (two) times daily., Disp: , Rfl:    Dulaglutide (TRULICITY) 1.5 XN/2.3FT SOPN, Inject 1.5 mg into the skin once a week. (Patient taking differently: Inject 1.5 mg into the skin every Sunday.), Disp: 2 mL, Rfl: 2   entecavir (BARACLUDE) 1 MG tablet, Take 1 mg by mouth at bedtime., Disp: , Rfl:    lisinopril (ZESTRIL) 20 MG tablet, Take 1 tablet (20 mg total) by mouth daily. (Patient taking differently: Take 20 mg by mouth at bedtime.), Disp: 90 tablet, Rfl: 1   metoprolol succinate (TOPROL-XL) 100 MG 24 hr tablet, Take 1 tablet by mouth once  daily, Disp: 90 tablet, Rfl: 0   mirtazapine (REMERON) 15 MG tablet, TAKE 1 TABLET BY MOUTH AT BEDTIME, Disp: 30 tablet, Rfl: 0   Evolocumab (REPATHA SURECLICK) 732 MG/ML SOAJ, Inject 140 mg into the skin every 14 (fourteen) days. (Patient not taking: Reported on 08/11/2021), Disp: 2 mL, Rfl: 2   Vitamin D, Ergocalciferol, (DRISDOL) 1.25 MG (50000 UNIT) CAPS capsule, Take 1 capsule (50,000 Units total) by mouth every 7 (seven) days. (Patient not taking: Reported on 08/27/2021), Disp: 8 capsule, Rfl: 0  Allergies  Allergen Reactions   Gabapentin Other (See Comments)    Uncontrolled shakes / cold intolerance     Objective:   BP 113/72   Pulse 92   Temp 98.3 F (36.8 C) (Temporal)   Ht '4\' 10"'$  (1.473 m)   Wt 127 lb (57.6 kg)   LMP 04/06/2011   SpO2 93%   BMI 26.54 kg/m    Physical Exam Vitals reviewed.  Constitutional:      General: She is not in acute distress.    Appearance: Normal appearance. She is normal weight. She is not ill-appearing, toxic-appearing or diaphoretic.  HENT:     Head: Normocephalic and atraumatic.     Right Ear: Tympanic membrane, ear canal and external ear normal.     Left Ear: Tympanic membrane, ear canal and external ear normal.     Nose: Nose normal. No congestion.     Mouth/Throat:     Mouth: Mucous membranes are moist.     Pharynx: Oropharynx is clear. No oropharyngeal exudate or posterior oropharyngeal erythema.  Cardiovascular:     Rate and Rhythm: Normal rate and regular rhythm.     Pulses: Normal pulses.     Heart sounds: Normal heart sounds.  Pulmonary:     Effort: Pulmonary effort is normal. No respiratory distress.     Breath sounds: Normal breath sounds. No wheezing or rhonchi.  Abdominal:     General: Bowel sounds are normal. There is no distension.     Palpations: Abdomen is soft. There is no  mass.  Musculoskeletal:     Cervical back: Normal range of motion.     Thoracic back: Deformity and tenderness present. Decreased range of motion.  Scoliosis present.     Lumbar back: Deformity and tenderness present. Decreased range of motion. Scoliosis present.     Right hip: Normal.  Skin:    General: Skin is warm and dry.     Findings: Signs of injury (stage 2 pressure injury to left buttock) present.  Neurological:     General: No focal deficit present.     Mental Status: She is alert and oriented to person, place, and time.     Motor: No weakness.     Gait: Gait abnormal (hunched over and uses a walker).  Psychiatric:        Mood and Affect: Mood normal.        Behavior: Behavior normal.        Thought Content: Thought content normal.        Judgment: Judgment normal.

## 2021-08-28 ENCOUNTER — Other Ambulatory Visit: Payer: Self-pay | Admitting: Family Medicine

## 2021-08-28 DIAGNOSIS — E559 Vitamin D deficiency, unspecified: Secondary | ICD-10-CM

## 2021-08-28 LAB — LIPID PANEL
Chol/HDL Ratio: 4 ratio (ref 0.0–4.4)
Cholesterol, Total: 185 mg/dL (ref 100–199)
HDL: 46 mg/dL (ref >39)
LDL Chol Calc (NIH): 116 mg/dL — ABNORMAL HIGH (ref 0–99)
Triglycerides: 131 mg/dL (ref 0–149)
VLDL Cholesterol Cal: 23 mg/dL (ref 5–40)

## 2021-08-28 MED ORDER — VITAMIN D (ERGOCALCIFEROL) 1.25 MG (50000 UNIT) PO CAPS: 50000.0000 [IU] | ORAL_CAPSULE | ORAL | 0 refills | Status: DC

## 2021-08-29 ENCOUNTER — Other Ambulatory Visit: Payer: Self-pay

## 2021-08-29 DIAGNOSIS — Z79899 Other long term (current) drug therapy: Secondary | ICD-10-CM | POA: Diagnosis not present

## 2021-08-29 MED ORDER — PEG 3350-KCL-NA BICARB-NACL 420 G PO SOLR: 4000.0000 mL | ORAL | 0 refills | Status: DC

## 2021-08-29 NOTE — Telephone Encounter (Signed)
Pt called office, procedure scheduled for 10/10/21 at 9:00am. Rx for prep sent to pharmacy. Orders entered. Endo scheduler informed pt needs urine drug screen at pre-op appt.   PA for TCS submitted via [facility] website. PA# [id number], valid 10/10/21-01/08/22. PA for EGD submitted via [facility] website. PA# [id number], valid 10/10/21-01/08/22. No PA required for esophageal varices banding per [facility] website.

## 2021-08-29 NOTE — Telephone Encounter (Signed)
Pre-op appt 10/08/21. Appt letter mailed with procedure instructions.

## 2021-08-31 ENCOUNTER — Encounter: Payer: Self-pay | Admitting: Family Medicine

## 2021-08-31 DIAGNOSIS — E559 Vitamin D deficiency, unspecified: Secondary | ICD-10-CM | POA: Insufficient documentation

## 2021-08-31 DIAGNOSIS — E509 Vitamin A deficiency, unspecified: Secondary | ICD-10-CM | POA: Insufficient documentation

## 2021-08-31 LAB — VITAMIN D 25 HYDROXY (VIT D DEFICIENCY, FRACTURES): Vit D, 25-Hydroxy: 22.1 ng/mL — ABNORMAL LOW (ref 30.0–100.0)

## 2021-08-31 LAB — VITAMIN A: Vitamin A: 12.5 ug/dL — ABNORMAL LOW (ref 22.0–69.5)

## 2021-09-01 ENCOUNTER — Ambulatory Visit (INDEPENDENT_AMBULATORY_CARE_PROVIDER_SITE_OTHER): Payer: Medicare HMO | Admitting: *Deleted

## 2021-09-01 DIAGNOSIS — D696 Thrombocytopenia, unspecified: Secondary | ICD-10-CM

## 2021-09-01 DIAGNOSIS — I10 Essential (primary) hypertension: Secondary | ICD-10-CM

## 2021-09-01 DIAGNOSIS — E1165 Type 2 diabetes mellitus with hyperglycemia: Secondary | ICD-10-CM

## 2021-09-01 NOTE — Progress Notes (Signed)
Has been faxed.

## 2021-09-01 NOTE — Chronic Care Management (AMB) (Signed)
Chronic Care Management   CCM RN Visit Note  09/01/2021 Name: [place] Strieter MRN: [id number] DOB: 09-11-1955  Subjective: Ahyana Skillin is a 66 y.o. year old female who is a primary care patient of Loman Brooklyn, FNP. The care management team was consulted for assistance with disease management and care coordination needs.    Engaged with patient by telephone for follow up visit in response to provider referral for case management and/or care coordination services.   Consent to Services:  The patient was given information about Chronic Care Management services, agreed to services, and gave verbal consent prior to initiation of services.  Please see initial visit note for detailed documentation.   Patient agreed to services and verbal consent obtained.   Assessment: Review of patient past medical history, allergies, medications, health status, including review of consultants reports, laboratory and other test data, was performed as part of comprehensive evaluation and provision of chronic care management services.   SDOH (Social Determinants of Health) assessments and interventions performed:    CCM Care Plan  Allergies  Allergen Reactions   Gabapentin Other (See Comments)    Uncontrolled shakes / cold intolerance     Outpatient Encounter Medications as of 09/01/2021  Medication Sig Note   Buprenorphine HCl-Naloxone HCl 4-1 MG FILM Place 1 strip under the tongue 2 (two) times daily.    cefadroxil (DURICEF) 500 MG capsule Take 100 mg by mouth 2 (two) times daily.    Dulaglutide (TRULICITY) 3 JM/4.2AS SOPN Inject 3 mg as directed once a week.    entecavir (BARACLUDE) 1 MG tablet Take 1 mg by mouth at bedtime. 05/15/2021: Filled at mail order pharmacy per pt (unknown name)   Evolocumab (REPATHA SURECLICK) 341 MG/ML SOAJ Inject 140 mg into the skin every 14 (fourteen) days.    lisinopril (ZESTRIL) 20 MG tablet Take 1 tablet (20 mg total) by mouth daily. (Patient taking  differently: Take 20 mg by mouth at bedtime.)    metoprolol succinate (TOPROL-XL) 100 MG 24 hr tablet Take 1 tablet by mouth once daily    mirtazapine (REMERON) 15 MG tablet TAKE 1 TABLET BY MOUTH AT BEDTIME    polyethylene glycol-electrolytes (TRILYTE) 420 g solution Take 4,000 mLs by mouth as directed.    Vitamin D, Ergocalciferol, (DRISDOL) 1.25 MG (50000 UNIT) CAPS capsule Take 1 capsule (50,000 Units total) by mouth every 7 (seven) days.    No facility-administered encounter medications on file as of 09/01/2021.    Patient Active Problem List   Diagnosis Date Noted   Vitamin D deficiency 08/31/2021   Vitamin A deficiency 08/31/2021   Constipation 08/11/2021   Dysphagia 08/11/2021   Malnutrition of moderate degree 05/17/2021   Chronic pain 05/15/2021   CAD (coronary artery disease) 05/15/2021   Discitis of thoracic region 05/15/2021   DNR (do not resuscitate) 05/15/2021   Pressure injury of left buttock, stage 2 ([place]) 05/11/2021   Aortic atherosclerosis ([place]) 02/18/2021   Moderate episode of recurrent major depressive disorder ([place]) 02/18/2021   Tachycardia 02/18/2021   Statins contraindicated 01/16/2021   Cirrhosis of liver without ascites ([place]) 10/23/2020   History of drug abuse ([place]) 10/23/2020   Mixed hyperlipidemia 04/18/2019   Other pancytopenia ([place]) 03/02/2019   Thrombocytopenia ([place])    Insomnia due to medical condition 09/22/2018   Chronic active viral hepatitis B ([place]) 01/26/2017   Uncontrolled type 2 diabetes mellitus with hyperglycemia, without long-term current use of insulin ([place]) 01/13/2017   Essential hypertension 12/17/2016   Heart  murmur, systolic 24/12/7351   Smoker 08/26/2016   Idiopathic scoliosis 01/28/2016   Cervical spondylosis with myelopathy 07/08/2013    Conditions to be addressed/monitored:HTN, DMII, and thrombocytopenia  Care Plan : Trainer  Updates made by Ilean China, RN since 09/01/2021 12:00 AM     Problem: Chronic Disease  Management Needs   Priority: High  Onset Date: 02/17/2021     Long-Range Goal: Work with RN Care Manager Regarding Care Management and Care Coordination Associated with Cirrhosis, Hypertension and Diabetes, Thrombocytopenia, and Anemia   Start Date: 02/17/2021  Expected End Date: 02/17/2022  This Visit's Progress: On track  Recent Progress: On track  Priority: High  Note:   Current Barriers:  Chronic Disease Management support and education needs related to HTN, DMII, and cirrhosis, pancytopenia, and anemia  RNCM Clinical Goal(s):  Patient will continue to work with RN Care Manager and/or Social Worker to address care management and care coordination needs related to HTN, DMII, and cirrhosis, thrombocytopenia, and anemia as evidenced by adherence to CM Team Scheduled appointments     through collaboration with PharmD, LCSW, provider, and care team.   Interventions: 1:1 collaboration with primary care provider regarding development and update of comprehensive plan of care as evidenced by provider attestation and co-signature Inter-disciplinary care team collaboration (see longitudinal plan of care) Evaluation of current treatment plan related to  self management and patient's adherence to plan as established by provider Provided with RN Care Manager contact information and encouraged to reach out as needed Assessed family/social support Discussed mobility and ability to perform ADLs   Thrombocytopenia/Leukopenia:  (Status: New goal.) Long Term Goal  Lab Results  Component Value Date   WBC 2.4 (L) 08/11/2021   HGB 11.9 08/11/2021   HCT 36.8 08/11/2021   MCV 85.4 08/11/2021   PLT 56 (L) 08/11/2021  Evaluation of current treatment plan related to thrombocytopenia and Leukopenia  Reviewed and discussed most recent visit with GI  Reviewed and discussed lab results Continues to have very low platelets and WBCs HGB returned to normal Discussed scheduled appointment for  colonoscopy/endoscopy at the end of June Provided education on thrombocytopenia and cautioned to avoid falls and to seek medical attention after any falls Reached out to GI PA, Aliene Altes, regarding referral to hematology. This hasn't been placed and may need to be addressed prior to procedures. Advised that patient prefers a location in [place]. Awaiting response.    Diabetes:  (Status: Goal on track: NO.) Long Term Goal  Lab Results  Component Value Date   HGBA1C 12.4 (H) 08/27/2021   HGBA1C 6.9 (H) 05/08/2021   HGBA1C 13.0 (H) 01/16/2021   Lab Results  Component Value Date   LDLCALC 116 (H) 08/27/2021   CREATININE 0.58 06/11/2021  Assessed patient's understanding of A1c goal: <7% Provided education to patient about basic DM disease process; Reviewed medications with patient and discussed importance of medication adherence;        Counseled on importance of regular laboratory monitoring as prescribed;        Provided patient with written educational materials related to hypo and hyperglycemia and importance of correct treatment;       Advised patient, providing education and rationale, to check cbg once daily and when you have symptoms of low or high blood sugar and record        Assessed social determinant of health barriers;       Reviewed recent office notes and lab results. Discussed significantly increased A1C and  potential causes.  Reviewed and discussed medication changes Reviewed upcoming appointment with PharmD Discussed diet and provided education on ADA/Carb modified diet.  Engineer, civil (consulting) prepared and mailed to patient.  Discussed barriers to healthy eating: has pain when eating with dentures so she has to take them out and can only eat soft foods. Declined dental intervention for now.    Hypertension: (Status: Goal on Track (progressing): YES.) Last practice recorded BP readings:  BP Readings from Last 3 Encounters:  08/27/21 113/72  08/11/21  120/74  07/10/21 (!) 147/82   Reviewed medications with patient and discussed importance of compliance;  Counseled on the importance of exercise goals with target of 150 minutes per week Advised patient, providing education and rationale, to monitor blood pressure daily and record, calling PCP for findings outside established parameters;  Discussed complications of poorly controlled blood pressure such as heart disease, stroke, circulatory complications, vision complications, kidney impairment, sexual dysfunction;  Assessed social determinant of health barriers;    SDOH Barriers (Status: Condition stable. Not addressed this visit.) Long Term Goal  Patient interviewed and SDOH assessment performed       Discussed prescription assistance through [employer] Provided verbal education regarding Medicare Extra Help Collaborated with [place] office staff to print and mail information on how to apply for LIS Discussed upcoming appt with PharmD   Infection:  (Status: Goal Met.) Short Term Goal  Evaluation of current treatment plan regarding infection in thoracic spine Reviewed and discussed relevant notes, reports, and treatment plan Reviewed upcoming appt with infectious disease Discussed medications and updated medication list to include antibiotics and correct suboxone dose Discussed appointment with [facility] for management of chronic back pain Pain assessment performed Encouraged to reach out to pain management as needed Encouraged to reach out to infectious disease with any new or worsening symptoms    Patient Goals/Self-Care Activities: Take medications as prescribed   Attend all scheduled provider appointments Call provider office for new concerns or questions  check blood sugar at prescribed times: once daily and when you have symptoms of low or high blood sugar check feet daily for cuts, sores or redness read food labels for fat, fiber, carbohydrates and portion  size check blood pressure 3 times per week write blood pressure results in a log or diary take blood pressure log to all doctor appointments eat more whole grains, fruits and vegetables, lean meats and healthy fats Call RN Care Manager as needed [phone] Talk with GI office regarding referral to hematologist for low platelets and white blood cells Move carefully and change positions slowly to avoid falls Review educational handouts on diabetic diet recommendations Seek medical attention after any falls  Plan:Telephone follow up appointment with care management team member scheduled for:  09/15/21 at 2:30 with [facility] The patient has been provided with contact information for the care management team and has been advised to call with any health related questions or concerns.   Chong Sicilian, BSN, RN-BC Embedded Chronic Care Manager [facility] [place] Management Direct Dial: [phone]

## 2021-09-01 NOTE — Patient Instructions (Signed)
Visit Information  Thank you for taking time to visit with me today. Please don't hesitate to contact me if I can be of assistance to you before our next scheduled telephone appointment.  Following are the goals we discussed today:  Patient Goals/Self-Care Activities: Take medications as prescribed   Attend all scheduled provider appointments Call provider office for new concerns or questions  check blood sugar at prescribed times: once daily and when you have symptoms of low or high blood sugar check feet daily for cuts, sores or redness read food labels for fat, fiber, carbohydrates and portion size check blood pressure 3 times per week write blood pressure results in a log or diary take blood pressure log to all doctor appointments eat more whole grains, fruits and vegetables, lean meats and healthy fats Call RN Care Manager as needed [phone] Talk with GI office regarding referral to hematologist for low platelets and white blood cells Move carefully and change positions slowly to avoid falls Review educational handouts on diabetic diet recommendations Seek medical attention after any falls  Our next appointment is by telephone on 09/15/21 at 2:30  Please call the care guide team at [phone] if you need to cancel or reschedule your appointment.   If you are experiencing a Mental Health or [place] or need someone to talk to, please call the [facility]: [phone] call [phone]   Patient verbalizes understanding of instructions and care plan provided today and agrees to view in [place]. Active MyChart status and patient understanding of how to access instructions and care plan via MyChart confirmed with patient.     Chong Sicilian, BSN, RN-BC Embedded Chronic Care Manager [facility] / [place] Management Direct Dial: [phone]

## 2021-09-02 ENCOUNTER — Telehealth: Payer: Self-pay | Admitting: Licensed Clinical Social Worker

## 2021-09-02 ENCOUNTER — Telehealth: Payer: Medicare HMO

## 2021-09-02 NOTE — Telephone Encounter (Signed)
  Chronic Care Management     Clinical Social Work Note   09/02/21 Name: Bryna Razavi          MRN: [id number]       DOB: 03-26-1956   Gaynelle Pastrana is a 66 y.o. year old female who is a primary care patient of Loman Brooklyn, FNP. The CCM team was consulted to assist the patient with chronic disease management and/or care coordination needs related to: [employer] .   Unsuccessful phone outreach to client. LCSW called client phone number several times today but was unable to speak via phone with client. LCSW  did leave phone message for Zonnie asking her to please call LCSW at [phone].  Follow Up Plan:  Care Guide scheduler to re-schedule LCSW phone visit with client.  Norva Riffle.Joslyne Marshburn MSW, [place] [facility] representative [facility] Care Management [phone]

## 2021-09-03 ENCOUNTER — Telehealth: Payer: Self-pay | Admitting: Gastroenterology

## 2021-09-03 DIAGNOSIS — D696 Thrombocytopenia, unspecified: Secondary | ICD-10-CM

## 2021-09-03 DIAGNOSIS — D72819 Decreased white blood cell count, unspecified: Secondary | ICD-10-CM

## 2021-09-03 NOTE — Telephone Encounter (Signed)
===  View-only below this line=== ----- Message ----- From: Ilean China, RN Sent: 09/01/2021   2:32 PM EDT To: Erenest Rasher, PA-C Subject: Hematology Referral                            Grace Stokes,  I talked with Ms Lamore today and discussed her recent visit and lab results and explained the need for a hematology referral. She prefers a [facility] location. She's scheduled for a colonoscopy/endoscopy at the end of June. I thought this referral may be urgent due to the risk for bleeding with those procedures.   Please let me know if I can be of any assistance.   Thank you,  Chong Sicilian, BSN, RN-BC Embedded Chronic Care Manager [facility] Care Management Direct Dial: [phone]   [facility] Clinical Pool:  Received the above message from [facility], BSN, RN-BC. Appears patient is requesting referral to hematology in [place]. I had recommended referral due to thrombocytopenia and leukopenia, but we have not been able to reach patient.  Please go ahead and place referral to hematology in [facility] ASAP.

## 2021-09-03 NOTE — Addendum Note (Signed)
Addended by: Cheron Every on: 09/03/2021 12:16 PM   Modules accepted: Orders

## 2021-09-03 NOTE — Telephone Encounter (Signed)
Referral sent to [facility]

## 2021-09-10 DIAGNOSIS — Z794 Long term (current) use of insulin: Secondary | ICD-10-CM | POA: Diagnosis not present

## 2021-09-10 DIAGNOSIS — I10 Essential (primary) hypertension: Secondary | ICD-10-CM

## 2021-09-10 DIAGNOSIS — F17210 Nicotine dependence, cigarettes, uncomplicated: Secondary | ICD-10-CM

## 2021-09-10 DIAGNOSIS — E1165 Type 2 diabetes mellitus with hyperglycemia: Secondary | ICD-10-CM | POA: Diagnosis not present

## 2021-09-15 ENCOUNTER — Telehealth: Payer: Medicare HMO

## 2021-09-17 ENCOUNTER — Encounter: Payer: Self-pay | Admitting: Family Medicine

## 2021-09-17 ENCOUNTER — Ambulatory Visit (INDEPENDENT_AMBULATORY_CARE_PROVIDER_SITE_OTHER): Payer: Medicare HMO | Admitting: Family Medicine

## 2021-09-17 ENCOUNTER — Telehealth: Payer: Self-pay | Admitting: Pharmacist

## 2021-09-17 ENCOUNTER — Ambulatory Visit (INDEPENDENT_AMBULATORY_CARE_PROVIDER_SITE_OTHER): Payer: Medicare HMO | Admitting: Pharmacist

## 2021-09-17 VITALS — BP 149/85 | HR 97 | Temp 98.1°F | Ht <= 58 in | Wt 127.0 lb

## 2021-09-17 DIAGNOSIS — I10 Essential (primary) hypertension: Secondary | ICD-10-CM

## 2021-09-17 DIAGNOSIS — I2510 Atherosclerotic heart disease of native coronary artery without angina pectoris: Secondary | ICD-10-CM

## 2021-09-17 DIAGNOSIS — K5903 Drug induced constipation: Secondary | ICD-10-CM | POA: Diagnosis not present

## 2021-09-17 DIAGNOSIS — G4701 Insomnia due to medical condition: Secondary | ICD-10-CM | POA: Diagnosis not present

## 2021-09-17 DIAGNOSIS — F331 Major depressive disorder, recurrent, moderate: Secondary | ICD-10-CM

## 2021-09-17 DIAGNOSIS — Z720 Tobacco use: Secondary | ICD-10-CM | POA: Diagnosis not present

## 2021-09-17 DIAGNOSIS — Z0001 Encounter for general adult medical examination with abnormal findings: Secondary | ICD-10-CM | POA: Diagnosis not present

## 2021-09-17 DIAGNOSIS — Z0000 Encounter for general adult medical examination without abnormal findings: Secondary | ICD-10-CM

## 2021-09-17 DIAGNOSIS — T402X5A Adverse effect of other opioids, initial encounter: Secondary | ICD-10-CM

## 2021-09-17 DIAGNOSIS — B181 Chronic viral hepatitis B without delta-agent: Secondary | ICD-10-CM

## 2021-09-17 DIAGNOSIS — E1165 Type 2 diabetes mellitus with hyperglycemia: Secondary | ICD-10-CM

## 2021-09-17 MED ORDER — METOPROLOL SUCCINATE ER 100 MG PO TB24: 100.0000 mg | ORAL_TABLET | Freq: Every day | ORAL | 1 refills | Status: DC

## 2021-09-17 MED ORDER — MIRTAZAPINE 15 MG PO TABS: 15.0000 mg | ORAL_TABLET | Freq: Every day | ORAL | 1 refills | Status: DC

## 2021-09-17 NOTE — Progress Notes (Signed)
Assessment & Plan:  Well adult exam Discussed health benefits of physical activity, and encouraged her to engage in regular exercise appropriate for her age and condition. Preventive health education provided. Patient declined COVID booster and pap smear.   Immunization History  Administered Date(s) Administered   Fluad Quad(high Dose 65+) 01/16/2021   Influenza,inj,Quad PF,6+ Mos 01/13/2017, 03/15/2018, 12/23/2018   Moderna Sars-Covid-2 Vaccination 10/14/2019, 11/11/2019   PNEUMOCOCCAL CONJUGATE-20 05/08/2021   Tdap 05/08/2021   Zoster Recombinat (Shingrix) 08/27/2021   Health Maintenance  Topic Date Due   PAP SMEAR-Modifier  09/17/2021 (Originally 11/11/1976)   COVID-19 Vaccine (3 - Moderna risk series) 10/03/2021 (Originally 12/09/2019)   Zoster Vaccines- Shingrix (2 of 2) 10/22/2021   INFLUENZA VACCINE  11/11/2021   FOOT EXAM  01/16/2022   HEMOGLOBIN A1C  02/27/2022   OPHTHALMOLOGY EXAM  06/24/2022   MAMMOGRAM  10/23/2022   TETANUS/TDAP  05/09/2031   COLONOSCOPY (Pts 45-77yr Insurance coverage will need to be confirmed)  06/24/2031   Pneumonia Vaccine 66 Years old  Completed   Hepatitis C Screening  Completed   HIV Screening  Completed   HPV VACCINES  Aged Out   DEXA SCAN  Discontinued   2. Tobacco use - CT CHEST LUNG CA SCREEN LOW DOSE W/O CM; Future  3. Constipation due to opioid therapy Miralax 1 capful in 6-8 oz beverage of choice once daily as needed.    Follow-up: Return in about 3 months (around 12/18/2021) for follow-up of chronic medication conditions.   BHendricks Limes MSN, APRN, FNP-C [facility]  Subjective:  Patient ID: Grace Stokes female    DOB: 8Oct 16, 1957 Age: 66y.o. MRN: [id number] Patient Care Team: JLoman Brooklyn FNP as PCP - General (Family Medicine) DRoosevelt Locks CRNP as Nurse Practitioner (Nephrology) FShea EvansMNorva Riffle LCSW as TStormstownManagement (Licensed Clinical Social  Worker) HIlean China RN as Case Manager LHarlen Labs MD as Referring Physician (Optometry) CEloise Harman DO as Consulting Physician (Gastroenterology) JCelestia Khat [place](Optometry)   CC:  Chief Complaint  Patient presents with   Annual Exam    HPI WCerina Learyis a 66y.o. female who presents today for a complete physical exam. She reports consuming a diabetic. The patient does not participate in regular exercise at present. She generally feels fairly well. She reports sleeping poorly. States she only sleeps 2-3 hours and then she is awake due to chronic pain. She does not have additional problems to discuss today.   Vision:Within last year Dental:No regular dental care (dentures)  Lung Cancer Screening with low-dose Chest CT: agreeable   Advanced Directives Patient does have advanced directives including living will and healthcare power of attorney. She does not have a copy in the electronic medical record.    Review of Systems  Constitutional:  Negative for chills, fever, malaise/fatigue and weight loss.  HENT:  Negative for congestion, ear discharge, ear pain, nosebleeds, sinus pain, sore throat and tinnitus.   Eyes:  Negative for blurred vision, double vision, pain, discharge and redness.  Respiratory:  Negative for cough, shortness of breath and wheezing.   Cardiovascular:  Negative for chest pain, palpitations and leg swelling.  Gastrointestinal:  Positive for constipation. Negative for abdominal pain, diarrhea, heartburn, nausea and vomiting.  Genitourinary:  Negative for dysuria, frequency and urgency.  Musculoskeletal:  Positive for back pain and neck pain. Negative for myalgias.  Skin:  Negative for rash.  Neurological:  Negative for  dizziness, seizures, weakness and headaches.  Psychiatric/Behavioral:  Negative for depression, substance abuse and suicidal ideas. The patient is not nervous/anxious.     Current Outpatient Medications:     Buprenorphine HCl-Naloxone HCl 4-1 MG FILM, Place 1 strip under the tongue 2 (two) times daily., Disp: , Rfl:    cefadroxil (DURICEF) 500 MG capsule, Take 100 mg by mouth 2 (two) times daily., Disp: , Rfl:    Dulaglutide (TRULICITY) 3 ZO/1.0RU SOPN, Inject 3 mg as directed once a week., Disp: 6 mL, Rfl: 1   entecavir (BARACLUDE) 1 MG tablet, Take 1 mg by mouth at bedtime., Disp: , Rfl:    Evolocumab (REPATHA SURECLICK) 045 MG/ML SOAJ, Inject 140 mg into the skin every 14 (fourteen) days., Disp: 2 mL, Rfl: 5   lisinopril (ZESTRIL) 20 MG tablet, Take 1 tablet (20 mg total) by mouth daily. (Patient taking differently: Take 20 mg by mouth at bedtime.), Disp: 90 tablet, Rfl: 1   metoprolol succinate (TOPROL-XL) 100 MG 24 hr tablet, Take 1 tablet by mouth once daily, Disp: 90 tablet, Rfl: 0   mirtazapine (REMERON) 15 MG tablet, TAKE 1 TABLET BY MOUTH AT BEDTIME, Disp: 30 tablet, Rfl: 0   polyethylene glycol-electrolytes (TRILYTE) 420 g solution, Take 4,000 mLs by mouth as directed., Disp: 4000 mL, Rfl: 0   Vitamin D, Ergocalciferol, (DRISDOL) 1.25 MG (50000 UNIT) CAPS capsule, Take 1 capsule (50,000 Units total) by mouth every 7 (seven) days., Disp: 8 capsule, Rfl: 0  Allergies  Allergen Reactions   Gabapentin Other (See Comments)    Uncontrolled shakes / cold intolerance     Past Medical History:  Diagnosis Date   Arthritis    Bell's palsy    neuropathy    CAD (coronary artery disease)    Cataract    Chronic pain    Cirrhosis ([place])    Depression    Diabetes mellitus type 2 in nonobese ([place])    Hypertension    IV drug abuse ([place])    Scoliosis     Past Surgical History:  Procedure Laterality Date   ANTERIOR CERVICAL DECOMP/DISCECTOMY FUSION N/A 07/09/2013   Procedure: ANTERIOR CERVICAL DECOMPRESSION/DISCECTOMY FUSION 2 LEVELS;  Surgeon: Consuella Lose, MD;  Location: [facility];  Service: Neurosurgery;  Laterality: N/A;  Anterior Cervical Fusion Cervical five-six, six-seven.   BACK  SURGERY     CARPAL TUNNEL RELEASE     CESAREAN SECTION     COLONOSCOPY  2017   [facility]; external and internal hemorrhoids, 1 small polyp in the descending colon resected, but not retrieved.   ELBOW SURGERY Left    I & D EXTREMITY Left 12/17/2016   Procedure: IRRIGATION AND DEBRIDEMENT LEFT HAND;  Surgeon: Iran Planas, MD;  Location: [facility];  Service: Orthopedics;  Laterality: Left;   INCISION / DRAINAGE HAND / FINGER Left 12/17/2016   ROTATOR CUFF REPAIR     SHOULDER SURGERY     TUBAL LIGATION      Family History  Problem Relation Age of Onset   COPD Mother    Stroke Mother    Arthritis Brother        back issues   Cancer Brother        skin / back    Obesity Son    Colon cancer Neg Hx     Social History   Socioeconomic History   Marital status: Married    Spouse name: Tim   Number of children: 1   Years of education: Not on  file   Highest education level: Not on file  Occupational History   Occupation: disability   Tobacco Use   Smoking status: Every Day    Packs/day: 0.50    Types: Cigarettes   Smokeless tobacco: Never  Vaping Use   Vaping Use: Never used  Substance and Sexual Activity   Alcohol use: Not Currently   Drug use: Not Currently    Comment: Previously injected illicit Fentanyl.  Clean since February 2023.   Sexual activity: Not on file  Other Topics Concern   Not on file  Social History Narrative   Son lives 15 minutes away and hasn't visited in over a year.   Social Determinants of Health   Financial Resource Strain: High Risk   Difficulty of Paying Living Expenses: Hard  Food Insecurity: Food Insecurity Present   Worried About Running Out of Food in the Last Year: Sometimes true   Ran Out of Food in the Last Year: Sometimes true  Transportation Needs: No Transportation Needs   Lack of Transportation (Medical): No   Lack of Transportation (Non-Medical): No  Physical Activity: Inactive   Days of Exercise per Week: 0 days   Minutes of  Exercise per Session: 0 min  Stress: Stress Concern Present   Feeling of Stress : Rather much  Social Connections: Moderately Isolated   Frequency of Communication with Friends and Family: More than three times a week   Frequency of Social Gatherings with Friends and Family: Twice a week   Attends Religious Services: Never   Marine scientist or Organizations: No   Attends Music therapist: Never   Marital Status: Married  Human resources officer Violence: Not At Risk   Fear of Current or Ex-Partner: No   Emotionally Abused: No   Physically Abused: No   Sexually Abused: No      Objective:    BP (!) 149/85   Pulse 97   Temp 98.1 F (36.7 C) (Temporal)   Ht _0  (1.473 m)   Wt 127 lb (57.6 kg)   LMP 04/06/2011   SpO2 98%   BMI 26.54 kg/m     Physical Exam Vitals reviewed.  Constitutional:      General: She is not in acute distress.    Appearance: Normal appearance. She is not ill-appearing, toxic-appearing or diaphoretic.  HENT:     Head: Normocephalic and atraumatic.     Right Ear: Tympanic membrane, ear canal and external ear normal. There is no impacted cerumen.     Left Ear: Tympanic membrane, ear canal and external ear normal. There is no impacted cerumen.     Nose: Nose normal. No congestion or rhinorrhea.     Mouth/Throat:     Mouth: Mucous membranes are moist.     Pharynx: Oropharynx is clear. No oropharyngeal exudate or posterior oropharyngeal erythema.  Eyes:     General: No scleral icterus.       Right eye: No discharge.        Left eye: No discharge.     Conjunctiva/sclera: Conjunctivae normal.     Pupils: Pupils are equal, round, and reactive to light.  Cardiovascular:     Rate and Rhythm: Normal rate and regular rhythm.     Heart sounds: Normal heart sounds. No murmur heard.   No friction rub. No gallop.  Pulmonary:     Effort: Pulmonary effort is normal. No respiratory distress.     Breath sounds: Normal breath sounds. No stridor. No  wheezing,  rhonchi or rales.  Abdominal:     General: Abdomen is flat. Bowel sounds are normal. There is no distension.     Palpations: Abdomen is soft. There is no hepatomegaly, splenomegaly or mass.     Tenderness: There is no abdominal tenderness. There is no guarding or rebound.     Hernia: No hernia is present.  Musculoskeletal:        General: Normal range of motion.     Cervical back: Normal range of motion and neck supple. No rigidity. No muscular tenderness.  Lymphadenopathy:     Cervical: No cervical adenopathy.  Skin:    General: Skin is warm and dry.     Capillary Refill: Capillary refill takes less than 2 seconds.  Neurological:     General: No focal deficit present.     Mental Status: She is alert and oriented to person, place, and time. Mental status is at baseline.     Gait: Gait abnormal (walking with cane).  Psychiatric:        Mood and Affect: Mood normal.        Behavior: Behavior normal.        Thought Content: Thought content normal.        Judgment: Judgment normal.    Lab Results  Component Value Date   TSH 0.442 (L) 01/28/2016   Lab Results  Component Value Date   WBC 2.4 (L) 08/11/2021   HGB 11.9 08/11/2021   HCT 36.8 08/11/2021   MCV 85.4 08/11/2021   PLT 56 (L) 08/11/2021   Lab Results  Component Value Date   NA 137 06/11/2021   K 3.5 06/11/2021   CO2 28 06/11/2021   GLUCOSE 371 (H) 06/11/2021   BUN 9 06/11/2021   CREATININE 0.58 06/11/2021   BILITOT 0.9 06/11/2021   ALKPHOS 151 (H) 05/29/2021   AST 29 06/11/2021   ALT 13 06/11/2021   PROT 7.8 06/11/2021   ALBUMIN 3.0 (L) 05/29/2021   CALCIUM 8.6 06/11/2021   ANIONGAP 7 05/19/2021   EGFR 105 05/29/2021   Lab Results  Component Value Date   CHOL 185 08/27/2021   Lab Results  Component Value Date   HDL 46 08/27/2021   Lab Results  Component Value Date   LDLCALC 116 (H) 08/27/2021   Lab Results  Component Value Date   TRIG 131 08/27/2021   Lab Results  Component Value  Date   CHOLHDL 4.0 08/27/2021   Lab Results  Component Value Date   HGBA1C 12.4 (H) 08/27/2021

## 2021-09-17 NOTE — Patient Instructions (Signed)
Miralax 1 capful in 6-8 oz beverage of choice once daily as needed for constipation.

## 2021-09-17 NOTE — Telephone Encounter (Signed)
DX codes for repatha Patient has ASCVD I70 E78.2  ACGB#[id number]  [facility] pharmacy [phone] Urgent approval  Approved repatha 04/04/22

## 2021-09-18 ENCOUNTER — Ambulatory Visit (HOSPITAL_COMMUNITY)
Admission: RE | Admit: 2021-09-18 | Discharge: 2021-09-18 | Disposition: A | Discharge status: Home / Self Care | Payer: Medicare HMO | Source: Ambulatory Visit | Attending: Family Medicine | Admitting: Family Medicine

## 2021-09-18 DIAGNOSIS — M5126 Other intervertebral disc displacement, lumbar region: Secondary | ICD-10-CM | POA: Diagnosis not present

## 2021-09-18 DIAGNOSIS — M47816 Spondylosis without myelopathy or radiculopathy, lumbar region: Secondary | ICD-10-CM | POA: Diagnosis not present

## 2021-09-18 DIAGNOSIS — M48061 Spinal stenosis, lumbar region without neurogenic claudication: Secondary | ICD-10-CM | POA: Diagnosis not present

## 2021-09-18 DIAGNOSIS — G8929 Other chronic pain: Secondary | ICD-10-CM | POA: Diagnosis not present

## 2021-09-18 DIAGNOSIS — M545 Low back pain, unspecified: Secondary | ICD-10-CM | POA: Insufficient documentation

## 2021-09-18 NOTE — Progress Notes (Signed)
Chronic Care Management Pharmacy Note  09/17/2021 Name:  Grace Stokes MRN:  [id number] DOB:  November 04, 1955  Summary:  Diabetes: Goal on Track (progressing): YES. Uncontrolled--A1C 13%-->12.4%; current treatment: TRULICITY 9.2EQ-->ASTMHDQQI to TRULICITY 68m WEEKLY(patient has started this);  CAN'T AFFORD; ENROLLED IN PATIENT ASSISTANCE VIA [facility] ESCRIBE TO [place] personal and family history of Medullary thyroid cancer (MTC) PATIENT USES ACCUCHECK GUIDE GLUCOMETER WILL LIKELY HAVE TO ADD ANOTHER MEDICATION AT F/U Current glucose readings: fasting glucose: N/A, post prandial glucose: N/A Discussed meal planning options and Plate method for healthy eating Avoid sugary drinks and desserts Incorporate balanced protein, non starchy veggies, 1 serving of carbohydrate with each meal Increase water intake Increase physical activity as able Current exercise: N/A--WALKS WITH WALKER; UNABLE TO PARTICIPATE IN EXERCISE Counseled on TRULICITY, GLUCOMETER Recommended TRULICITY, WILL ADD ADDITIONAL MEDICATION AT F/U Assessed patient finances. ENROLLED IN THE [employer] PATIENT ASSISTANCE PROGRAM Hyperlipidemia:  New goal. Uncontrolled; current treatment: N/A DUE TO LIVER;  CONSIDER NON-STATIN ALTERNATIVE-->REPATHA WAS APPROVED; UNSURE OF COST (PATIENT TO LET ME KNOW; [facility] IS CLOSED, BUT WE CAN GET NEXT YEAR) WILL CHECK WITH GI Medications previously tried: N/A  Current dietary patterns: RECOMMENDED HEART HEALTHY DIET   Lipid Panel     Component Value Date/Time   CHOL 185 08/27/2021 1357   TRIG 131 08/27/2021 1357   HDL 46 08/27/2021 1357   CHOLHDL 4.0 08/27/2021 1357   LDLCALC 116 (H) 08/27/2021 1357   LABVLDL 23 08/27/2021 1357    Subjective: Grace Stokes an 66y.o. year old female who is a primary patient of JLoman Brooklyn FNP.  The CCM team was consulted for assistance with disease management and care coordination needs.     Engaged with patient by telephone for follow up visit in response to provider referral for pharmacy case management and/or care coordination services.   Consent to Services:  The patient was given information about Chronic Care Management services, agreed to services, and gave verbal consent prior to initiation of services.  Please see initial visit note for detailed documentation.   Patient Care Team: JLoman Brooklyn FNP as PCP - General (Family Medicine) DRoosevelt Locks CRNP as Nurse Practitioner (Nephrology) FShea EvansMNorva Riffle LCSW as TArchbaldManagement (Licensed Clinical Social Worker) HIlean China RN as Case Manager LHarlen Labs MD as Referring Physician (Optometry) CAbbey Chatters CElon Alas DO as Consulting Physician (Gastroenterology) JCelestia Khat OD (Optometry)  Objective:  Lab Results  Component Value Date   CREATININE 0.58 06/11/2021   CREATININE 0.49 (L) 05/29/2021   CREATININE 0.53 05/19/2021    Lab Results  Component Value Date   HGBA1C 12.4 (H) 08/27/2021   Last diabetic Eye exam:  Lab Results  Component Value Date/Time   HMDIABEYEEXA Retinopathy (A) 06/23/2021 12:00 AM    Last diabetic Foot exam: No results found for: "HMDIABFOOTEX"      Component Value Date/Time   CHOL 185 08/27/2021 1357   TRIG 131 08/27/2021 1357   HDL 46 08/27/2021 1357   CHOLHDL 4.0 08/27/2021 1357   LDLCALC 116 (H) 08/27/2021 1357       Latest Ref Rng & Units 06/11/2021    4:07 PM 05/29/2021    2:51 PM 05/19/2021    6:11 AM  Hepatic Function  Total Protein 6.1 - 8.1 g/dL 7.8  7.8  6.4   Albumin 3.8 - 4.8 g/dL  3.0  2.2   AST 10 - 35 U/L 29  31  24   ALT 6 - 29 U/L '13  16  14   ' Alk Phosphatase 44 - 121 IU/L  151  69   Total Bilirubin 0.2 - 1.2 mg/dL 0.9  0.7  0.9     Lab Results  Component Value Date/Time   TSH 0.442 (L) 01/28/2016 02:18 PM       Latest Ref Rng & Units 08/11/2021    9:26 AM 05/29/2021    2:51 PM 05/20/2021    7:05 AM  CBC   WBC 3.8 - 10.8 Thousand/uL 2.4  2.8  1.7   Hemoglobin 11.7 - 15.5 g/dL 11.9  10.0  8.8   Hematocrit 35.0 - 45.0 % 36.8  34.2  29.6   Platelets 140 - 400 Thousand/uL 56  70  47     Lab Results  Component Value Date/Time   VD25OH 22.1 (L) 08/27/2021 03:53 PM   VD25OH 17.8 (L) 05/29/2021 02:51 PM    Clinical ASCVD: No  The 10-year ASCVD risk score (Arnett DK, et al., 2019) is: 32.6%   Values used to calculate the score:     Age: 54 years     Sex: Female     Is Non-Hispanic African American: No     Diabetic: Yes     Tobacco smoker: Yes     Systolic Blood Pressure: 601 mmHg     Is BP treated: Yes     HDL Cholesterol: 46 mg/dL     Total Cholesterol: 185 mg/dL    Other: (CHADS2VASc if Afib, PHQ9 if depression, MMRC or CAT for COPD, ACT, DEXA)  Social History   Tobacco Use  Smoking Status Every Day   Packs/day: 0.50   Types: Cigarettes  Smokeless Tobacco Never   BP Readings from Last 3 Encounters:  09/17/21 (!) 149/85  08/27/21 113/72  08/11/21 120/74   Pulse Readings from Last 3 Encounters:  09/17/21 97  08/27/21 92  08/11/21 98   Wt Readings from Last 3 Encounters:  09/17/21 127 lb (57.6 kg)  08/27/21 127 lb (57.6 kg)  08/11/21 120 lb (54.4 kg)    Assessment: Review of patient past medical history, allergies, medications, health status, including review of consultants reports, laboratory and other test data, was performed as part of comprehensive evaluation and provision of chronic care management services.   SDOH:  (Social Determinants of Health) assessments and interventions performed:    CCM Care Plan  Allergies  Allergen Reactions   Gabapentin Other (See Comments)    Uncontrolled shakes / cold intolerance     Medications Reviewed Today     Reviewed by Lavera Guise, [facility] (Pharmacist) on 09/18/21 at [place] List Status: <None>   Medication Order Taking? Sig Documenting Provider Last Dose Status Informant  Buprenorphine HCl-Naloxone HCl 4-1 MG  FILM [id number] No Place 1 strip under the tongue 2 (two) times daily. [provider] Taking Active   cefadroxil (DURICEF) 500 MG capsule [id number] No Take 100 mg by mouth 2 (two) times daily. [provider] Taking Active   Dulaglutide (TRULICITY) 3 HV/7.4BB SOPN [id number] No Inject 3 mg as directed once a week. Loman Brooklyn, FNP Taking Active            Med Note Parthenia Ames Sep 18, 2021 10:35 AM) Via [employer] patient assistance program    entecavir (BARACLUDE) 1 MG tablet [id number] No Take 1 mg by mouth at bedtime. [provider] Taking Active Self  Med Note (ATKINS, HEATHER L   Thu May 15, 2021  1:10 PM) Filled at mail order pharmacy per pt (unknown name)  Evolocumab (REPATHA SURECLICK) 462 MG/ML SOAJ [id number] No Inject 140 mg into the skin every 14 (fourteen) days. Loman Brooklyn, FNP Taking Active   lisinopril (ZESTRIL) 20 MG tablet [id number] No Take 1 tablet (20 mg total) by mouth daily.  Patient taking differently: Take 20 mg by mouth at bedtime.   Loman Brooklyn, FNP Taking Active Self  metoprolol succinate (TOPROL-XL) 100 MG 24 hr tablet [id number]  Take 1 tablet (100 mg total) by mouth daily. Take with or immediately following a meal. Loman Brooklyn, FNP  Active   mirtazapine (REMERON) 15 MG tablet [id number]  Take 1 tablet (15 mg total) by mouth at bedtime. Loman Brooklyn, FNP  Active   Vitamin D, Ergocalciferol, (DRISDOL) 1.25 MG (50000 UNIT) CAPS capsule [id number] No Take 1 capsule (50,000 Units total) by mouth every 7 (seven) days. Loman Brooklyn, FNP Taking Active   Med List Note Lavera Guise [facility] 09/17/21 3202): Trulicity VIA [facility] PATIENT ASSISTANCE escribe to [facility]            Patient Active Problem List   Diagnosis Date Noted   Vitamin D deficiency 08/31/2021   Vitamin A deficiency 08/31/2021   Constipation 08/11/2021   Dysphagia 08/11/2021   Malnutrition of moderate degree 05/17/2021    Chronic pain 05/15/2021   CAD (coronary artery disease) 05/15/2021   Discitis of thoracic region 05/15/2021   DNR (do not resuscitate) 05/15/2021   Pressure injury of left buttock, stage 2 ([place]) 05/11/2021   Aortic atherosclerosis ([place]) 02/18/2021   Moderate episode of recurrent major depressive disorder ([place]) 02/18/2021   Tachycardia 02/18/2021   Statins contraindicated 01/16/2021   Cirrhosis of liver without ascites ([place]) 10/23/2020   History of drug abuse ([place]) 10/23/2020   Mixed hyperlipidemia 04/18/2019   Other pancytopenia ([place]) 03/02/2019   Thrombocytopenia ([place])    Insomnia due to medical condition 09/22/2018   Chronic active viral hepatitis B ([place]) 01/26/2017   Uncontrolled type 2 diabetes mellitus with hyperglycemia, without long-term current use of insulin ([place]) 01/13/2017   Essential hypertension 12/17/2016   Heart murmur, systolic 33/43/5686   Smoker 08/26/2016   Idiopathic scoliosis 01/28/2016   Cervical spondylosis with myelopathy 07/08/2013    Immunization History  Administered Date(s) Administered   Fluad Quad(high Dose 65+) 01/16/2021   Influenza,inj,Quad PF,6+ Mos 01/13/2017, 03/15/2018, 12/23/2018   Moderna Sars-Covid-2 Vaccination 10/14/2019, 11/11/2019   PNEUMOCOCCAL CONJUGATE-20 05/08/2021   Tdap 05/08/2021   Zoster Recombinat (Shingrix) 08/27/2021    Conditions to be addressed/monitored: HLD and DMII  Care Plan : PHARMD MEDICATION MANAGEMENT  Updates made by Lavera Guise, [name] since 09/18/2021 12:00 AM     Problem: DISEASE PROGRESSION PREVENTION      Long-Range Goal: T2DM, HLD   Recent Progress: Not on track  Priority: High  Note:   Current Barriers:  Unable to independently afford treatment regimen Unable to achieve control of T2DM, HLD  Suboptimal therapeutic regimen for T2DM, HLD  Pharmacist Clinical Goal(s):  patient will verbalize ability to afford treatment regimen achieve control of T2DM, HLD as evidenced by IMPROVED GLYCEMIC  CONTROL  through collaboration with PharmD and provider.   Interventions: 1:1 collaboration with Loman Brooklyn, FNP regarding development and update of comprehensive plan of care as evidenced by provider attestation and co-signature Inter-disciplinary care team collaboration (see longitudinal plan of care) Comprehensive medication  review performed; medication list updated in electronic medical record  Diabetes: Goal on Track (progressing): YES. Uncontrolled--A1C 13%-->12.4%; current treatment: TRULICITY 6.2BJ-->SEGBTDVVO to TRULICITY 1m WEEKLY(patient has started this);  CAN'T AFFORD; ENROLLED IN PATIENT ASSISTANCE VIA LILLY CARES ESCRIBE TO [place]Denies personal and family history of Medullary thyroid cancer (MTC) PATIENT USES ACCUCHECK GUIDE GLUCOMETER WILL LIKELY HAVE TO ADD ANOTHER MEDICATION AT F/U Current glucose readings: fasting glucose: N/A, post prandial glucose: N/A Discussed meal planning options and Plate method for healthy eating Avoid sugary drinks and desserts Incorporate balanced protein, non starchy veggies, 1 serving of carbohydrate with each meal Increase water intake Increase physical activity as able Current exercise: N/A--WALKS WITH WALKER; UNABLE TO PARTICIPATE IN EXERCISE Counseled on TRULICITY, GLUCOMETER Recommended TRULICITY, WILL ADD ADDITIONAL MEDICATION AT F/U Assessed patient finances. ENROLLED IN THE [employer] PATIENT ASSISTANCE PROGRAM Hyperlipidemia:  New goal. Uncontrolled; current treatment: N/A DUE TO LIVER;  CONSIDER NON-STATIN ALTERNATIVE-->REPATHA WAS APPROVED; UNSURE OF COST (PATIENT TO LET ME KNOW; [facility] IS CLOSED, BUT WE CAN GET NEXT YEAR) WILL CHECK WITH GI Medications previously tried: N/A  Current dietary patterns: RECOMMENDED HEART HEALTHY DIET   Lipid Panel     Component Value Date/Time   CHOL 185 08/27/2021 1357   TRIG 131 08/27/2021 1357   HDL 46 08/27/2021 1357   CHOLHDL 4.0 08/27/2021 1357    LDLCALC 116 (H) 08/27/2021 1357   LABVLDL 23 08/27/2021 1357   Patient Goals/Self-Care Activities patient will:  - take medications as prescribed as evidenced by patient report and record review check glucose DAILY FASTING, document, and provide at future appointments collaborate with provider on medication access solutions engage in dietary modifications by FOLLOWING HEART HEALTHY/HEALTHY PLATE METHOD      Medication Assistance:  TRULICITY obtained through [place]ication assistance program.  Enrollment ends 04/12/22  Patient's preferred pharmacy is:  [address]Phone: [phone]: [phone] [facility] [address]Phone: [phone]: [address] [address]Phone: [phone]: [phone]  Follow Up:  Patient agrees to Care Plan and Follow-up.  Plan: Telephone follow up appointment with care management team member scheduled for:  2 MONTHS    JRegina Eck PharmD, BCPS Clinical Pharmacist, [place] Phone [phone]

## 2021-09-18 NOTE — Patient Instructions (Signed)
Visit Information  Following are the goals we discussed today:  Current Barriers:  Unable to independently afford treatment regimen Unable to achieve control of T2DM, HLD  Suboptimal therapeutic regimen for T2DM, HLD  Pharmacist Clinical Goal(s):  patient will verbalize ability to afford treatment regimen achieve control of T2DM, HLD as evidenced by IMPROVED GLYCEMIC CONTROL through collaboration with PharmD and provider.   Interventions: 1:1 collaboration with Loman Brooklyn, FNP regarding development and update of comprehensive plan of care as evidenced by provider attestation and co-signature Inter-disciplinary care team collaboration (see longitudinal plan of care) Comprehensive medication review performed; medication list updated in electronic medical record  Diabetes: Goal on Track (progressing): YES. Uncontrolled--A1C 13%-->12.4%; current treatment: TRULICITY 1.'5MG'$ -->increased to TRULICITY '3mg'$  WEEKLY(patient has started this);  CAN'T AFFORD; ENROLLED IN PATIENT ASSISTANCE VIA [facility] Denies personal and family history of Medullary thyroid cancer (MTC) PATIENT USES ACCUCHECK GUIDE GLUCOMETER WILL LIKELY HAVE TO ADD ANOTHER MEDICATION AT F/U Current glucose readings: fasting glucose: N/A, post prandial glucose: N/A Discussed meal planning options and Plate method for healthy eating Avoid sugary drinks and desserts Incorporate balanced protein, non starchy veggies, 1 serving of carbohydrate with each meal Increase water intake Increase physical activity as able Current exercise: N/A--WALKS WITH WALKER; UNABLE TO PARTICIPATE IN EXERCISE Counseled on TRULICITY, GLUCOMETER Recommended TRULICITY, WILL ADD ADDITIONAL MEDICATION AT F/U Assessed patient finances. ENROLLED IN THE [employer] PATIENT ASSISTANCE PROGRAM Hyperlipidemia:  New goal. Uncontrolled; current treatment: N/A DUE TO LIVER;  CONSIDER NON-STATIN ALTERNATIVE-->REPATHA WAS  APPROVED; UNSURE OF COST (PATIENT TO LET ME KNOW; [facility] IS CLOSED, BUT WE CAN GET NEXT YEAR) WILL CHECK WITH GI Medications previously tried: N/A  Current dietary patterns: RECOMMENDED HEART HEALTHY DIET   Lipid Panel     Component Value Date/Time   CHOL 185 08/27/2021 1357   TRIG 131 08/27/2021 1357   HDL 46 08/27/2021 1357   CHOLHDL 4.0 08/27/2021 1357   LDLCALC 116 (H) 08/27/2021 1357   LABVLDL 23 08/27/2021 1357    Patient Goals/Self-Care Activities patient will:  - take medications as prescribed as evidenced by patient report and record review check glucose DAILY FASTING, document, and provide at future appointments collaborate with provider on medication access solutions engage in dietary modifications by [place]: Telephone follow up appointment with care management team member scheduled for:  3 months  Signature Regina Eck, PharmD, BCPS Clinical Pharmacist, [place]  II Phone [phone]   Please call the care guide team at [phone] if you need to cancel or reschedule your appointment.   The patient verbalized understanding of instructions, educational materials, and care plan provided today and DECLINED offer to receive copy of patient instructions, educational materials, and care plan.

## 2021-09-22 ENCOUNTER — Encounter: Payer: Self-pay | Admitting: *Deleted

## 2021-09-22 ENCOUNTER — Ambulatory Visit: Payer: Medicare HMO | Admitting: *Deleted

## 2021-09-22 ENCOUNTER — Telehealth: Payer: Self-pay | Admitting: Internal Medicine

## 2021-09-22 DIAGNOSIS — I10 Essential (primary) hypertension: Secondary | ICD-10-CM

## 2021-09-22 DIAGNOSIS — D696 Thrombocytopenia, unspecified: Secondary | ICD-10-CM

## 2021-09-22 DIAGNOSIS — E1165 Type 2 diabetes mellitus with hyperglycemia: Secondary | ICD-10-CM

## 2021-09-22 NOTE — Telephone Encounter (Signed)
Pt LMOM that she needed to speak to someone about her scheduled EGD. [phone]

## 2021-09-22 NOTE — Telephone Encounter (Signed)
Called pt. She wanted to make sure she was having egd done with [facility]. Advised she was. Nothing further needed

## 2021-09-30 ENCOUNTER — Telehealth: Payer: Self-pay | Admitting: Hematology and Oncology

## 2021-09-30 ENCOUNTER — Encounter: Payer: Self-pay | Admitting: Emergency Medicine

## 2021-09-30 ENCOUNTER — Telehealth: Payer: Self-pay | Admitting: Family Medicine

## 2021-09-30 DIAGNOSIS — M545 Low back pain, unspecified: Secondary | ICD-10-CM

## 2021-09-30 NOTE — Telephone Encounter (Signed)
Loman Brooklyn, FNP  09/26/2021 12:34 PM EDT     Please let patient know her MRI showed some destruction in her back. Is she agreeable to a referral to a spine specialist?    Patient states she would like referral.

## 2021-09-30 NOTE — Telephone Encounter (Signed)
Referral placed.

## 2021-09-30 NOTE — Telephone Encounter (Signed)
Scheduled appt per 5/24 referral. Pt is aware of appt date and time. Pt is aware to arrive 15 mins prior to appt time and to bring and updated insurance card. Pt is aware of appt location.   

## 2021-10-01 DIAGNOSIS — F1120 Opioid dependence, uncomplicated: Secondary | ICD-10-CM | POA: Diagnosis not present

## 2021-10-01 DIAGNOSIS — Z79899 Other long term (current) drug therapy: Secondary | ICD-10-CM | POA: Diagnosis not present

## 2021-10-01 DIAGNOSIS — Z6826 Body mass index (BMI) 26.0-26.9, adult: Secondary | ICD-10-CM | POA: Diagnosis not present

## 2021-10-01 DIAGNOSIS — R030 Elevated blood-pressure reading, without diagnosis of hypertension: Secondary | ICD-10-CM | POA: Diagnosis not present

## 2021-10-02 ENCOUNTER — Inpatient Hospital Stay: Payer: Medicare HMO | Attending: Hematology and Oncology | Admitting: Hematology and Oncology

## 2021-10-02 ENCOUNTER — Other Ambulatory Visit: Payer: Self-pay

## 2021-10-02 ENCOUNTER — Ambulatory Visit (INDEPENDENT_AMBULATORY_CARE_PROVIDER_SITE_OTHER): Payer: Medicare HMO | Admitting: Family Medicine

## 2021-10-02 ENCOUNTER — Encounter: Payer: Self-pay | Admitting: Family Medicine

## 2021-10-02 DIAGNOSIS — J069 Acute upper respiratory infection, unspecified: Secondary | ICD-10-CM | POA: Diagnosis not present

## 2021-10-02 DIAGNOSIS — D61818 Other pancytopenia: Secondary | ICD-10-CM | POA: Diagnosis not present

## 2021-10-02 DIAGNOSIS — B1910 Unspecified viral hepatitis B without hepatic coma: Secondary | ICD-10-CM | POA: Diagnosis not present

## 2021-10-02 DIAGNOSIS — K7460 Unspecified cirrhosis of liver: Secondary | ICD-10-CM | POA: Diagnosis not present

## 2021-10-02 DIAGNOSIS — F17210 Nicotine dependence, cigarettes, uncomplicated: Secondary | ICD-10-CM | POA: Diagnosis not present

## 2021-10-02 MED ORDER — FLUTICASONE PROPIONATE 50 MCG/ACT NA SUSP: 1.0000 | Freq: Two times a day (BID) | NASAL | 6 refills | Status: DC | PRN

## 2021-10-02 MED ORDER — AMOXICILLIN 500 MG PO CAPS: 500.0000 mg | ORAL_CAPSULE | Freq: Two times a day (BID) | ORAL | 0 refills | Status: DC

## 2021-10-02 NOTE — Patient Instructions (Signed)
Grace Stokes  10/02/2021     '@PREFPERIOPPHARMACY'$ @   Your procedure is scheduled on  10/10/2021.   Report to [facility] at  0730  A.M.   Call this number if you have problems the morning of surgery:  [phone]   Remember:  Follow the diet and prep instructions given to myou by the office.    Your last dose of trulicity should have been 6/22 or before.    DO NOT take any medications for diabetes the morning of your procedure.    Take these medicines the morning of surgery with A SIP OF WATER                                   metoprolol.    Do not wear jewelry, make-up or nail polish.  Do not wear lotions, powders, or perfumes, or deodorant.  Do not shave 48 hours prior to surgery.  Men may shave face and neck.  Do not bring valuables to the hospital.  [facility] is not responsible for any belongings or valuables.  Contacts, dentures or bridgework may not be worn into surgery.  Leave your suitcase in the car.  After surgery it may be brought to your room.  For patients admitted to the hospital, discharge time will be determined by your treatment team.  Patients discharged the day of surgery will not be allowed to drive home and must  have someone with them for 24 hours.    Special instructions:   DO NOT smoke tobacco or vape for 24 hours before your procedure.  Please read over the following fact sheets that you were given. Anesthesia Post-op Instructions and Care and Recovery After Surgery      Upper Endoscopy, Adult, Care After This sheet gives you information about how to care for yourself after your procedure. Your health care provider may also give you more specific instructions. If you have problems or questions, contact your health care provider. What can I expect after the procedure? After the procedure, it is common to have: A sore throat. Mild stomach pain or discomfort. Bloating. Nausea. Follow these instructions at  home:  Follow instructions from your health care provider about what to eat or drink after your procedure. Return to your normal activities as told by your health care provider. Ask your health care provider what activities are safe for you. Take over-the-counter and prescription medicines only as told by your health care provider. If you were given a sedative during the procedure, it can affect you for several hours. Do not drive or operate machinery until your health care provider says that it is safe. Keep all follow-up visits as told by your health care provider. This is important. Contact a health care provider if you have: A sore throat that lasts longer than one day. Trouble swallowing. Get help right away if: You vomit blood or your vomit looks like coffee grounds. You have: A fever. Bloody, black, or tarry stools. A severe sore throat or you cannot swallow. Difficulty breathing. Severe pain in your chest or abdomen. Summary After the procedure, it is common to have a sore throat, mild stomach discomfort, bloating, and nausea. If you were given a sedative during the procedure, it can affect you for several hours. Do not drive or operate machinery until your health care provider says that it is  safe. Follow instructions from your health care provider about what to eat or drink after your procedure. Return to your normal activities as told by your health care provider. This information is not intended to replace advice given to you by your health care provider. Make sure you discuss any questions you have with your health care provider. Document Revised: 02/03/2019 Document Reviewed: 08/30/2017 Elsevier Patient Education  [place]. Esophageal Dilatation Esophageal dilatation, also called esophageal dilation, is a procedure to widen or open a blocked or narrowed part of the esophagus. The esophagus is the part of the body that moves food and liquid from the mouth to the  stomach. You may need this procedure if: You have a buildup of scar tissue in your esophagus that makes it difficult, painful, or impossible to swallow. This can be caused by gastroesophageal reflux disease (GERD). You have cancer of the esophagus. There is a problem with how food moves through your esophagus. In some cases, you may need this procedure repeated at a later time to dilate the esophagus gradually. Tell a health care provider about: Any allergies you have. All medicines you are taking, including vitamins, herbs, eye drops, creams, and over-the-counter medicines. Any problems you or family members have had with anesthetic medicines. Any blood disorders you have. Any surgeries you have had. Any medical conditions you have. Any antibiotic medicines you are required to take before dental procedures. Whether you are pregnant or may be pregnant. What are the risks? Generally, this is a safe procedure. However, problems may occur, including: Bleeding due to a tear in the lining of the esophagus. A hole, or perforation, in the esophagus. What happens before the procedure? Ask your health care provider about: Changing or stopping your regular medicines. This is especially important if you are taking diabetes medicines or blood thinners. Taking medicines such as aspirin and ibuprofen. These medicines can thin your blood. Do not take these medicines unless your health care provider tells you to take them. Taking over-the-counter medicines, vitamins, herbs, and supplements. Follow instructions from your health care provider about eating or drinking restrictions. Plan to have a responsible adult take you home from the hospital or clinic. Plan to have a responsible adult care for you for the time you are told after you leave the hospital or clinic. This is important. What happens during the procedure? You may be given a medicine to help you relax (sedative). A numbing medicine may be  sprayed into the back of your throat, or you may gargle the medicine. Your health care provider may perform the dilatation using various surgical instruments, such as: Simple dilators. This instrument is carefully placed in the esophagus to stretch it. Guided wire bougies. This involves using an endoscope to insert a wire into the esophagus. A dilator is passed over this wire to enlarge the esophagus. Then the wire is removed. Balloon dilators. An endoscope with a small balloon is inserted into the esophagus. The balloon is inflated to stretch the esophagus and open it up. The procedure may vary among health care providers and hospitals. What can I expect after the procedure? Your blood pressure, heart rate, breathing rate, and blood oxygen level will be monitored until you leave the hospital or clinic. Your throat may feel slightly sore and numb. This will get better over time. You will not be allowed to eat or drink until your throat is no longer numb. When you are able to drink, urinate, and sit on the edge of  the bed without nausea or dizziness, you may be able to return home. Follow these instructions at home: Take over-the-counter and prescription medicines only as told by your health care provider. If you were given a sedative during the procedure, it can affect you for several hours. Do not drive or operate machinery until your health care provider says that it is safe. Plan to have a responsible adult care for you for the time you are told. This is important. Follow instructions from your health care provider about any eating or drinking restrictions. Do not use any products that contain nicotine or tobacco, such as cigarettes, e-cigarettes, and chewing tobacco. If you need help quitting, ask your health care provider. Keep all follow-up visits. This is important. Contact a health care provider if: You have a fever. You have pain that is not relieved by medicine. Get help right away  if: You have chest pain. You have trouble breathing. You have trouble swallowing. You vomit blood. You have black, tarry, or bloody stools. These symptoms may represent a serious problem that is an emergency. Do not wait to see if the symptoms will go away. Get medical help right away. Call your local emergency services (911 in the [place] Do not drive yourself to the hospital. Summary Esophageal dilatation, also called esophageal dilation, is a procedure to widen or open a blocked or narrowed part of the esophagus. Plan to have a responsible adult take you home from the hospital or clinic. For this procedure, a numbing medicine may be sprayed into the back of your throat, or you may gargle the medicine. Do not drive or operate machinery until your health care provider says that it is safe. This information is not intended to replace advice given to you by your health care provider. Make sure you discuss any questions you have with your health care provider. Document Revised: 08/16/2019 Document Reviewed: 08/16/2019 Elsevier Patient Education  [place]. Colonoscopy, Adult, Care After The following information offers guidance on how to care for yourself after your procedure. Your health care provider may also give you more specific instructions. If you have problems or questions, contact your health care provider. What can I expect after the procedure? After the procedure, it is common to have: A small amount of blood in your stool for 24 hours after the procedure. Some gas. Mild cramping or bloating of your abdomen. Follow these instructions at home: Eating and drinking  Drink enough fluid to keep your urine pale yellow. Follow instructions from your health care provider about eating or drinking restrictions. Resume your normal diet as told by your health care provider. Avoid heavy or fried foods that are hard to digest. Activity Rest as told by your health care provider. Avoid  sitting for a long time without moving. Get up to take short walks every 1-2 hours. This is important to improve blood flow and breathing. Ask for help if you feel weak or unsteady. Return to your normal activities as told by your health care provider. Ask your health care provider what activities are safe for you. Managing cramping and bloating  Try walking around when you have cramps or feel bloated. If directed, apply heat to your abdomen as told by your health care provider. Use the heat source that your health care provider recommends, such as a moist heat pack or a heating pad. Place a towel between your skin and the heat source. Leave the heat on for 20-30 minutes. Remove the heat if  your skin turns bright red. This is especially important if you are unable to feel pain, heat, or cold. You have a greater risk of getting burned. General instructions If you were given a sedative during the procedure, it can affect you for several hours. Do not drive or operate machinery until your health care provider says that it is safe. For the first 24 hours after the procedure: Do not sign important documents. Do not drink alcohol. Do your regular daily activities at a slower pace than normal. Eat soft foods that are easy to digest. Take over-the-counter and prescription medicines only as told by your health care provider. Keep all follow-up visits. This is important. Contact a health care provider if: You have blood in your stool 2-3 days after the procedure. Get help right away if: You have more than a small spotting of blood in your stool. You have large blood clots in your stool. You have swelling of your abdomen. You have nausea or vomiting. You have a fever. You have increasing pain in your abdomen that is not relieved with medicine. These symptoms may be an emergency. Get help right away. Call 911. Do not wait to see if the symptoms will go away. Do not drive yourself to the  hospital. Summary After the procedure, it is common to have a small amount of blood in your stool. You may also have mild cramping and bloating of your abdomen. If you were given a sedative during the procedure, it can affect you for several hours. Do not drive or operate machinery until your health care provider says that it is safe. Get help right away if you have a lot of blood in your stool, nausea or vomiting, a fever, or increased pain in your abdomen. This information is not intended to replace advice given to you by your health care provider. Make sure you discuss any questions you have with your health care provider. Document Revised: 11/20/2020 Document Reviewed: 11/20/2020 Elsevier Patient Education  [place] After This sheet gives you information about how to care for yourself after your procedure. Your health care provider may also give you more specific instructions. If you have problems or questions, contact your health care provider. What can I expect after the procedure? After the procedure, it is common to have: Tiredness. Forgetfulness about what happened after the procedure. Impaired judgment for important decisions. Nausea or vomiting. Some difficulty with balance. Follow these instructions at home: For the time period you were told by your health care provider:     Rest as needed. Do not participate in activities where you could fall or become injured. Do not drive or use machinery. Do not drink alcohol. Do not take sleeping pills or medicines that cause drowsiness. Do not make important decisions or sign legal documents. Do not take care of children on your own. Eating and drinking Follow the diet that is recommended by your health care provider. Drink enough fluid to keep your urine pale yellow. If you vomit: Drink water, juice, or soup when you can drink without vomiting. Make sure you have little or no nausea  before eating solid foods. General instructions Have a responsible adult stay with you for the time you are told. It is important to have someone help care for you until you are awake and alert. Take over-the-counter and prescription medicines only as told by your health care provider. If you have sleep apnea, surgery and certain medicines can increase  your risk for breathing problems. Follow instructions from your health care provider about wearing your sleep device: Anytime you are sleeping, including during daytime naps. While taking prescription pain medicines, sleeping medicines, or medicines that make you drowsy. Avoid smoking. Keep all follow-up visits as told by your health care provider. This is important. Contact a health care provider if: You keep feeling nauseous or you keep vomiting. You feel light-headed. You are still sleepy or having trouble with balance after 24 hours. You develop a rash. You have a fever. You have redness or swelling around the IV site. Get help right away if: You have trouble breathing. You have new-onset confusion at home. Summary For several hours after your procedure, you may feel tired. You may also be forgetful and have poor judgment. Have a responsible adult stay with you for the time you are told. It is important to have someone help care for you until you are awake and alert. Rest as told. Do not drive or operate machinery. Do not drink alcohol or take sleeping pills. Get help right away if you have trouble breathing, or if you suddenly become confused. This information is not intended to replace advice given to you by your health care provider. Make sure you discuss any questions you have with your health care provider. Document Revised: 03/04/2021 Document Reviewed: 03/02/2019 Elsevier Patient Education  [place].

## 2021-10-02 NOTE — Assessment & Plan Note (Signed)
Lab review: 10/30/2021: WBC 1.7, hemoglobin 8.8, platelets 47 08/11/2021: WBC 2.4, hemoglobin 11.9, platelets 56, ANC 1.3, ALC 0.8  Longstanding leukopenia and thrombocytopenia: Related to cirrhosis of the liver and hepatitis B. I discussed with the patient that there are no findings suggestive of acute leukemia or autoimmune disorders and therefore I did not recommend bone marrow biopsy.  I reiterated with her that every time she has a bleed her platelets will drop significantly. Patient has a colonoscopy and endoscopy coming up and she will proceed with them.  Return to clinic on an as-needed basis.

## 2021-10-02 NOTE — Progress Notes (Signed)
Virtual Visit via telephone Note  I connected with Lakelyn Straus on 10/02/21 at 1702 by telephone and verified that I am speaking with the correct person using two identifiers. Tajuanna Burnett is currently located at home and patient are currently with her during visit. The provider, Fransisca Kaufmann Zabria Liss, MD is located in their office at time of visit.  Call ended at 1709  I discussed the limitations, risks, security and privacy concerns of performing an evaluation and management service by telephone and the availability of in person appointments. I also discussed with the patient that there may be a patient responsible charge related to this service. The patient expressed understanding and agreed to proceed.   History and Present Illness: Patient awoke this am with a sore throat and chest congestion.  She had an oncology appt this am.  She has a colonoscopy next week. She has a sore throat spray.  She denies fevers or chills.  She has clear phlegm production on cough.  Her husband had same thing starting 3 days ago. She denies wheezing.     1. Upper respiratory tract infection, unspecified type     Outpatient Encounter Medications as of 10/02/2021  Medication Sig   amoxicillin (AMOXIL) 500 MG capsule Take 1 capsule (500 mg total) by mouth 2 (two) times daily.   fluticasone (FLONASE) 50 MCG/ACT nasal spray Place 1 spray into both nostrils 2 (two) times daily as needed for allergies or rhinitis.   Buprenorphine HCl-Naloxone HCl (SUBOXONE) 8-2 MG FILM Place 1 Film under the tongue in the morning and at bedtime.   Dulaglutide (TRULICITY) 1.5 UR/4.2HC SOPN Inject 1.5 mg into the skin every Sunday.   entecavir (BARACLUDE) 1 MG tablet Take 1 mg by mouth at bedtime.   Evolocumab (REPATHA SURECLICK) 623 MG/ML SOAJ Inject 140 mg into the skin every 14 (fourteen) days.   lisinopril (ZESTRIL) 20 MG tablet Take 1 tablet (20 mg total) by mouth daily. (Patient taking differently: Take 20 mg by  mouth at bedtime.)   metoprolol succinate (TOPROL-XL) 100 MG 24 hr tablet Take 1 tablet (100 mg total) by mouth daily. Take with or immediately following a meal.   mirtazapine (REMERON) 15 MG tablet Take 1 tablet (15 mg total) by mouth at bedtime.   Vitamin D, Ergocalciferol, (DRISDOL) 1.25 MG (50000 UNIT) CAPS capsule Take 1 capsule (50,000 Units total) by mouth every 7 (seven) days. (Patient taking differently: Take 50,000 Units by mouth every Sunday.)   No facility-administered encounter medications on file as of 10/02/2021.    Review of Systems  Constitutional:  Negative for chills and fever.  HENT:  Positive for congestion, postnasal drip, rhinorrhea, sinus pressure, sneezing and sore throat. Negative for ear discharge and ear pain.   Eyes:  Negative for visual disturbance.  Respiratory:  Positive for cough. Negative for chest tightness, shortness of breath and wheezing.   Cardiovascular:  Negative for chest pain and leg swelling.  Musculoskeletal:  Negative for back pain and gait problem.  Skin:  Negative for rash.  Neurological:  Negative for light-headedness and headaches.  Psychiatric/Behavioral:  Negative for agitation and behavioral problems.   All other systems reviewed and are negative.   Observations/Objective: Patient is coughing but otherwise sounds comfortable and in no acute distress  Assessment and Plan: Problem List Items Addressed This Visit   None Visit Diagnoses     Upper respiratory tract infection, unspecified type    -  Primary   Relevant Medications   amoxicillin (  AMOXIL) 500 MG capsule   fluticasone (FLONASE) 50 MCG/ACT nasal spray       Because of cirrhosis and other high risk we will go ahead and treat with an antibiotic and give Flonase as well.  If does not improve give [place] a call back. Follow up plan: Return if symptoms worsen or fail to improve.     I discussed the assessment and treatment plan with the patient. The patient was provided an  opportunity to ask questions and all were answered. The patient agreed with the plan and demonstrated an understanding of the instructions.   The patient was advised to call back or seek an in-person evaluation if the symptoms worsen or if the condition fails to improve as anticipated.  The above assessment and management plan was discussed with the patient. The patient verbalized understanding of and has agreed to the management plan. Patient is aware to call the clinic if symptoms persist or worsen. Patient is aware when to return to the clinic for a follow-up visit. Patient educated on when it is appropriate to go to the emergency department.    I provided 7 minutes of non-face-to-face time during this encounter.    Worthy Rancher, MD

## 2021-10-03 DIAGNOSIS — Z79899 Other long term (current) drug therapy: Secondary | ICD-10-CM | POA: Diagnosis not present

## 2021-10-06 ENCOUNTER — Telehealth: Payer: Medicare HMO

## 2021-10-06 ENCOUNTER — Telehealth: Payer: Self-pay | Admitting: Family Medicine

## 2021-10-08 ENCOUNTER — Encounter (HOSPITAL_COMMUNITY)
Admission: RE | Admit: 2021-10-08 | Discharge: 2021-10-08 | Disposition: A | Discharge status: Home / Self Care | Payer: Medicare HMO | Source: Ambulatory Visit | Attending: Internal Medicine | Admitting: Internal Medicine

## 2021-10-08 ENCOUNTER — Encounter (HOSPITAL_COMMUNITY): Payer: Self-pay

## 2021-10-08 VITALS — BP 173/76 | HR 99 | Temp 97.5°F | Resp 18 | Ht <= 58 in | Wt 127.0 lb

## 2021-10-08 DIAGNOSIS — K7460 Unspecified cirrhosis of liver: Secondary | ICD-10-CM | POA: Diagnosis not present

## 2021-10-08 DIAGNOSIS — Z01812 Encounter for preprocedural laboratory examination: Secondary | ICD-10-CM | POA: Diagnosis not present

## 2021-10-08 DIAGNOSIS — D696 Thrombocytopenia, unspecified: Secondary | ICD-10-CM | POA: Insufficient documentation

## 2021-10-08 DIAGNOSIS — K625 Hemorrhage of anus and rectum: Secondary | ICD-10-CM | POA: Insufficient documentation

## 2021-10-08 DIAGNOSIS — R1310 Dysphagia, unspecified: Secondary | ICD-10-CM | POA: Diagnosis not present

## 2021-10-08 HISTORY — DX: Chronic obstructive pulmonary disease, unspecified: J44.9

## 2021-10-08 HISTORY — DX: Anemia, unspecified: D64.9

## 2021-10-08 LAB — COMPREHENSIVE METABOLIC PANEL
ALT: 18 U/L (ref 0–44)
AST: 33 U/L (ref 15–41)
Albumin: 3.1 g/dL — ABNORMAL LOW (ref 3.5–5.0)
Alkaline Phosphatase: 114 U/L (ref 38–126)
Anion gap: 8 (ref 5–15)
BUN: 7 mg/dL — ABNORMAL LOW (ref 8–23)
CO2: 26 mmol/L (ref 22–32)
Calcium: 8.8 mg/dL — ABNORMAL LOW (ref 8.9–10.3)
Chloride: 102 mmol/L (ref 98–111)
Creatinine, Ser: 0.43 mg/dL — ABNORMAL LOW (ref 0.44–1.00)
GFR, Estimated: >60 mL/min (ref >60)
Glucose, Bld: 327 mg/dL — ABNORMAL HIGH (ref 70–99)
Potassium: 3.4 mmol/L — ABNORMAL LOW (ref 3.5–5.1)
Sodium: 136 mmol/L (ref 135–145)
Total Bilirubin: 1.1 mg/dL (ref 0.3–1.2)
Total Protein: 7.4 g/dL (ref 6.5–8.1)

## 2021-10-08 LAB — CBC WITH DIFFERENTIAL/PLATELET
Abs Immature Granulocytes: 0.01 10*3/uL (ref 0.00–0.07)
Basophils Absolute: 0 10*3/uL (ref 0.0–0.1)
Basophils Relative: 1 %
Eosinophils Absolute: 0 10*3/uL (ref 0.0–0.5)
Eosinophils Relative: 2 %
HCT: 36.2 % (ref 36.0–46.0)
Hemoglobin: 12.7 g/dL (ref 12.0–15.0)
Immature Granulocytes: 0 %
Lymphocytes Relative: 29 %
Lymphs Abs: 0.8 10*3/uL (ref 0.7–4.0)
MCH: 30.1 pg (ref 26.0–34.0)
MCHC: 35.1 g/dL (ref 30.0–36.0)
MCV: 85.8 fL (ref 80.0–100.0)
Monocytes Absolute: 0.3 10*3/uL (ref 0.1–1.0)
Monocytes Relative: 10 %
Neutro Abs: 1.6 10*3/uL — ABNORMAL LOW (ref 1.7–7.7)
Neutrophils Relative %: 58 %
Platelets: 47 10*3/uL — ABNORMAL LOW (ref 150–400)
RBC: 4.22 MIL/uL (ref 3.87–5.11)
RDW: 14.8 % (ref 11.5–15.5)
WBC: 2.7 10*3/uL — ABNORMAL LOW (ref 4.0–10.5)
nRBC: 0 % (ref 0.0–0.2)

## 2021-10-08 LAB — PROTIME-INR
INR: 1.2 (ref 0.8–1.2)
Prothrombin Time: 15.1 seconds (ref 11.4–15.2)

## 2021-10-08 LAB — RAPID URINE DRUG SCREEN, HOSP PERFORMED
Amphetamines: NOT DETECTED
Barbiturates: NOT DETECTED
Benzodiazepines: NOT DETECTED
Cocaine: NOT DETECTED
Opiates: NOT DETECTED
Tetrahydrocannabinol: NOT DETECTED

## 2021-10-09 ENCOUNTER — Encounter: Payer: Self-pay | Admitting: *Deleted

## 2021-10-09 ENCOUNTER — Ambulatory Visit: Payer: Medicare HMO | Admitting: *Deleted

## 2021-10-09 DIAGNOSIS — E1165 Type 2 diabetes mellitus with hyperglycemia: Secondary | ICD-10-CM

## 2021-10-09 DIAGNOSIS — I10 Essential (primary) hypertension: Secondary | ICD-10-CM

## 2021-10-09 DIAGNOSIS — D696 Thrombocytopenia, unspecified: Secondary | ICD-10-CM

## 2021-10-09 NOTE — Patient Instructions (Signed)
Visit Information  Thank you for taking time to visit with me today. Please don't hesitate to contact me if I can be of assistance to you before our next scheduled telephone appointment.  Following are the goals we discussed today:  Take medications as prescribed   Attend all scheduled provider appointments Call provider office for new concerns or questions  check blood sugar at prescribed times: once daily and when you have symptoms of low or high blood sugar check feet daily for cuts, sores or redness read food labels for fat, fiber, carbohydrates and portion size check blood pressure 3 times per week write blood pressure results in a log or diary take blood pressure log to all doctor appointments eat more whole grains, fruits and vegetables, lean meats and healthy fats Call RN Care Manager as needed [phone] Move carefully and change positions slowly to avoid falls Review educational handouts on diabetic diet recommendations Seek medical attention after any falls Call hematology/oncology office to schedule an appointment to discuss low platelets and low white blood cell count [phone]  Our next appointment is by telephone on 10/06/21  Please call the care guide team at [phone] if you need to cancel or reschedule your appointment.   If you are experiencing a Mental Health or [place] or need someone to talk to, please call the [facility]: [phone] call 911   Patient verbalizes understanding of instructions and care plan provided today and agrees to view in [place]. Active MyChart status and patient understanding of how to access instructions and care plan via MyChart confirmed with patient.     Chong Sicilian, BSN, RN-BC Embedded Chronic Care Manager [facility] Direct Dial: [phone]

## 2021-10-09 NOTE — Chronic Care Management (AMB) (Signed)
Chronic Care Management   CCM RN Visit Note  10/09/2021 Name: Grace Stokes MRN: [id number] DOB: [birth year]  Subjective: Grace Stokes is a 66 y.o. year old female who is a primary care patient of Loman Brooklyn, FNP. The care management team was consulted for assistance with disease management and care coordination needs.    Engaged with patient by telephone for follow up visit in response to provider referral for case management and/or care coordination services.   Consent to Services:  The patient was given information about Chronic Care Management services, agreed to services, and gave verbal consent prior to initiation of services.  Please see initial visit note for detailed documentation.   Patient agreed to services and verbal consent obtained.   Assessment: Review of patient past medical history, allergies, medications, health status, including review of consultants reports, laboratory and other test data, was performed as part of comprehensive evaluation and provision of chronic care management services.   SDOH (Social Determinants of Health) assessments and interventions performed:    CCM Care Plan  Allergies  Allergen Reactions   Gabapentin Other (See Comments)    Uncontrolled shakes / cold intolerance     Outpatient Encounter Medications as of 10/09/2021  Medication Sig   amoxicillin (AMOXIL) 500 MG capsule Take 1 capsule (500 mg total) by mouth 2 (two) times daily.   Buprenorphine HCl-Naloxone HCl (SUBOXONE) 8-2 MG FILM Place 1 Film under the tongue in the morning and at bedtime.   Dulaglutide (TRULICITY) 1.5 YP/9.5KD SOPN Inject 1.5 mg into the skin every Sunday.   entecavir (BARACLUDE) 1 MG tablet Take 1 mg by mouth at bedtime.   Evolocumab (REPATHA SURECLICK) 326 MG/ML SOAJ Inject 140 mg into the skin every 14 (fourteen) days.   fluticasone (FLONASE) 50 MCG/ACT nasal spray Place 1 spray into both nostrils 2 (two) times daily as needed for allergies or  rhinitis.   lisinopril (ZESTRIL) 20 MG tablet Take 1 tablet (20 mg total) by mouth daily. (Patient taking differently: Take 20 mg by mouth at bedtime.)   metoprolol succinate (TOPROL-XL) 100 MG 24 hr tablet Take 1 tablet (100 mg total) by mouth daily. Take with or immediately following a meal.   mirtazapine (REMERON) 15 MG tablet Take 1 tablet (15 mg total) by mouth at bedtime.   Vitamin D, Ergocalciferol, (DRISDOL) 1.25 MG (50000 UNIT) CAPS capsule Take 1 capsule (50,000 Units total) by mouth every 7 (seven) days. (Patient taking differently: Take 50,000 Units by mouth every Sunday.)   No facility-administered encounter medications on file as of 10/09/2021.    Patient Active Problem List   Diagnosis Date Noted   Vitamin D deficiency 08/31/2021   Vitamin A deficiency 08/31/2021   Constipation 08/11/2021   Dysphagia 08/11/2021   Malnutrition of moderate degree 05/17/2021   Chronic pain 05/15/2021   CAD (coronary artery disease) 05/15/2021   Discitis of thoracic region 05/15/2021   DNR (do not resuscitate) 05/15/2021   Pressure injury of left buttock, stage 2 ([place]) 05/11/2021   Aortic atherosclerosis ([place]) 02/18/2021   Moderate episode of recurrent major depressive disorder ([place]) 02/18/2021   Tachycardia 02/18/2021   Statins contraindicated 01/16/2021   Cirrhosis of liver without ascites ([place]) 10/23/2020   History of drug abuse ([place]) 10/23/2020   Mixed hyperlipidemia 04/18/2019   Other pancytopenia ([place]) 03/02/2019   Thrombocytopenia ([place])    Insomnia due to medical condition 09/22/2018   Chronic active viral hepatitis B ([place]) 01/26/2017   Uncontrolled type 2 diabetes mellitus with  hyperglycemia, without long-term current use of insulin ([place]) 01/13/2017   Essential hypertension 12/17/2016   Heart murmur, systolic 16/38/4536   Smoker 08/26/2016   Idiopathic scoliosis 01/28/2016   Cervical spondylosis with myelopathy 07/08/2013    Conditions to be addressed/monitored:HTN, DMII, and  thrombocytopenia  Care Plan : [place]  Updates made by Ilean China, RN since 10/09/2021 12:00 AM     Problem: Chronic Disease Management Needs   Priority: High  Onset Date: 02/17/2021     Long-Range Goal: Work with RN Care Manager Regarding Care Management and Care Coordination Associated with Cirrhosis, Hypertension and Diabetes, Thrombocytopenia, and Anemia   Start Date: 02/17/2021  Expected End Date: 02/17/2022  This Visit's Progress: On track  Recent Progress: On track  Priority: High  Note:   Current Barriers:  Chronic Disease Management support and education needs related to HTN, DMII, and cirrhosis, pancytopenia, and anemia  RNCM Clinical Goal(s):  Patient will continue to work with RN Care Manager and/or Social Worker to address care management and care coordination needs related to HTN, DMII, and cirrhosis, thrombocytopenia, and anemia as evidenced by adherence to CM Team Scheduled appointments     through collaboration with PharmD, LCSW, provider, and care team.   Interventions: 1:1 collaboration with primary care provider regarding development and update of comprehensive plan of care as evidenced by provider attestation and co-signature Inter-disciplinary care team collaboration (see longitudinal plan of care) Evaluation of current treatment plan related to  self management and patient's adherence to plan as established by provider Provided with RN Care Manager contact information and encouraged to reach out as needed Assessed family/social support Discussed mobility and ability to perform ADLs   Thrombocytopenia/Leukopenia:  (Status: Goal on Track (progressing): YES.) Long Term Goal  Lab Results  Component Value Date   WBC 2.4 (L) 08/11/2021   HGB 11.9 08/11/2021   HCT 36.8 08/11/2021   MCV 85.4 08/11/2021   PLT 56 (L) 08/11/2021  Reviewed and discussed recent hematology visit due to low platelet and white blood cell count. Hematologist advised that this is  chronic and that platelet count will drop with any additional blood loss. She is cleared for colonoscopy though. Discussed appointment for colonoscopy tomorrow and colon prep today. No complications so far. Assessed knowledge based on previous education provided on thrombocytopenia and cautioned to avoid falls and to seek medical attention after any falls   Diabetes:  (Status: Condition stable. Not addressed this visit.) Long Term Goal  Lab Results  Component Value Date   HGBA1C 12.4 (H) 08/27/2021   HGBA1C 6.9 (H) 05/08/2021   HGBA1C 13.0 (H) 01/16/2021   Lab Results  Component Value Date   LDLCALC 116 (H) 08/27/2021   CREATININE 0.58 06/11/2021  Assessed patient's understanding of A1c goal: <7% Provided education to patient about basic DM disease process; Reviewed medications with patient and discussed importance of medication adherence;        Counseled on importance of regular laboratory monitoring as prescribed;        Provided patient with written educational materials related to hypo and hyperglycemia and importance of correct treatment;       Advised patient, providing education and rationale, to check cbg once daily and when you have symptoms of low or high blood sugar and record        Assessed social determinant of health barriers;       Reviewed recent office notes and lab results. Discussed significantly increased A1C and potential causes.  Reviewed  and discussed medication changes Reviewed upcoming appointment with PharmD Discussed diet and provided education on ADA/Carb modified diet.  Engineer, civil (consulting) prepared and mailed to patient.  Discussed barriers to healthy eating: has pain when eating with dentures so she has to take them out and can only eat soft foods. Declined dental intervention for now.    Hypertension: (Status: Condition stable. Not addressed this visit.) Last practice recorded BP readings:  BP Readings from Last 3 Encounters:  08/27/21  113/72  08/11/21 120/74  07/10/21 (!) 147/82   Reviewed medications with patient and discussed importance of compliance;  Counseled on the importance of exercise goals with target of 150 minutes per week Advised patient, providing education and rationale, to monitor blood pressure daily and record, calling PCP for findings outside established parameters;  Discussed complications of poorly controlled blood pressure such as heart disease, stroke, circulatory complications, vision complications, kidney impairment, sexual dysfunction;  Assessed social determinant of health barriers;    SDOH Barriers (Status: Condition stable. Not addressed this visit.) Long Term Goal  Patient interviewed and SDOH assessment performed       Discussed prescription assistance through [employer] Provided verbal education regarding Medicare Extra Help Collaborated with [place] office staff to print and mail information on how to apply for LIS Discussed upcoming appt with PharmD   Infection:  (Status: Goal Met.) Short Term Goal  Evaluation of current treatment plan regarding infection in thoracic spine Reviewed and discussed relevant notes, reports, and treatment plan Reviewed upcoming appt with infectious disease Discussed medications and updated medication list to include antibiotics and correct suboxone dose Discussed appointment with [facility] for management of chronic back pain Pain assessment performed Encouraged to reach out to pain management as needed Encouraged to reach out to infectious disease with any new or worsening symptoms    Patient Goals/Self-Care Activities: Take medications as prescribed   Attend all scheduled provider appointments Call provider office for new concerns or questions  check blood sugar at prescribed times: once daily and when you have symptoms of low or high blood sugar check feet daily for cuts, sores or redness read food labels for fat, fiber,  carbohydrates and portion size check blood pressure 3 times per week write blood pressure results in a log or diary take blood pressure log to all doctor appointments eat more whole grains, fruits and vegetables, lean meats and healthy fats Call RN Care Manager as needed [phone] Move carefully and change positions slowly to avoid falls Review educational handouts on diabetic diet recommendations Seek medical attention after any falls Continue colonoscopy prep and call GI with any questions or concerns  Plan:Telephone follow up appointment with care management team member scheduled for:  10/13/21 with RNCM at 11:00 The patient has been provided with contact information for the care management team and has been advised to call with any health related questions or concerns.   Chong Sicilian, BSN, RN-BC Embedded Chronic Care Manager [facility] / [place] Management Direct Dial: [phone]

## 2021-10-09 NOTE — Patient Instructions (Signed)
Visit Information  Thank you for taking time to visit with me today. Please don't hesitate to contact me if I can be of assistance to you before our next scheduled telephone appointment.  Following are the goals we discussed today:  (Copy and paste patient goals from clinical care plan here)  Our next appointment is by telephone on 10/13/21 at 11:00  Please call the care guide team at [phone] if you need to cancel or reschedule your appointment.   If you are experiencing a Mental Health or [place] or need someone to talk to, please call the [facility]: [phone] call [phone]   Patient verbalizes understanding of instructions and care plan provided today and agrees to view in [place]. Active MyChart status and patient understanding of how to access instructions and care plan via MyChart confirmed with patient.     Chong Sicilian, BSN, RN-BC Embedded Chronic Care Manager [facility] / [place] Management Direct Dial: [phone]

## 2021-10-09 NOTE — Chronic Care Management (AMB) (Signed)
Chronic Care Management   CCM RN Visit Note  09/22/2021 Name: Grace Stokes MRN: [id number] DOB: [birth year]  Subjective: Grace Stokes is a 66 y.o. year old female who is a primary care patient of Loman Brooklyn, FNP. The care management team was consulted for assistance with disease management and care coordination needs.    Engaged with patient by telephone for follow up visit in response to provider referral for case management and/or care coordination services.   Consent to Services:  The patient was given information about Chronic Care Management services, agreed to services, and gave verbal consent prior to initiation of services.  Please see initial visit note for detailed documentation.   Patient agreed to services and verbal consent obtained.   Assessment: Review of patient past medical history, allergies, medications, health status, including review of consultants reports, laboratory and other test data, was performed as part of comprehensive evaluation and provision of chronic care management services.   SDOH (Social Determinants of Health) assessments and interventions performed:    CCM Care Plan  Allergies  Allergen Reactions   Gabapentin Other (See Comments)    Uncontrolled shakes / cold intolerance     Outpatient Encounter Medications as of 09/22/2021  Medication Sig   entecavir (BARACLUDE) 1 MG tablet Take 1 mg by mouth at bedtime.   Evolocumab (REPATHA SURECLICK) 401 MG/ML SOAJ Inject 140 mg into the skin every 14 (fourteen) days.   lisinopril (ZESTRIL) 20 MG tablet Take 1 tablet (20 mg total) by mouth daily. (Patient taking differently: Take 20 mg by mouth at bedtime.)   metoprolol succinate (TOPROL-XL) 100 MG 24 hr tablet Take 1 tablet (100 mg total) by mouth daily. Take with or immediately following a meal.   mirtazapine (REMERON) 15 MG tablet Take 1 tablet (15 mg total) by mouth at bedtime.   Vitamin D, Ergocalciferol, (DRISDOL) 1.25 MG (50000  UNIT) CAPS capsule Take 1 capsule (50,000 Units total) by mouth every 7 (seven) days. (Patient taking differently: Take 50,000 Units by mouth every Sunday.)   [DISCONTINUED] Buprenorphine HCl-Naloxone HCl 4-1 MG FILM Place 1 strip under the tongue 2 (two) times daily.   [DISCONTINUED] cefadroxil (DURICEF) 500 MG capsule Take 100 mg by mouth 2 (two) times daily.   [DISCONTINUED] Dulaglutide (TRULICITY) 3 UU/7.2ZD SOPN Inject 3 mg as directed once a week.   No facility-administered encounter medications on file as of 09/22/2021.    Patient Active Problem List   Diagnosis Date Noted   Vitamin D deficiency 08/31/2021   Vitamin A deficiency 08/31/2021   Constipation 08/11/2021   Dysphagia 08/11/2021   Malnutrition of moderate degree 05/17/2021   Chronic pain 05/15/2021   CAD (coronary artery disease) 05/15/2021   Discitis of thoracic region 05/15/2021   DNR (do not resuscitate) 05/15/2021   Pressure injury of left buttock, stage 2 ([place]) 05/11/2021   Aortic atherosclerosis ([place]) 02/18/2021   Moderate episode of recurrent major depressive disorder ([place]) 02/18/2021   Tachycardia 02/18/2021   Statins contraindicated 01/16/2021   Cirrhosis of liver without ascites ([place]) 10/23/2020   History of drug abuse ([place]) 10/23/2020   Mixed hyperlipidemia 04/18/2019   Other pancytopenia ([place]) 03/02/2019   Thrombocytopenia ([place])    Insomnia due to medical condition 09/22/2018   Chronic active viral hepatitis B ([place]) 01/26/2017   Uncontrolled type 2 diabetes mellitus with hyperglycemia, without long-term current use of insulin ([place]) 01/13/2017   Essential hypertension 12/17/2016   Heart murmur, systolic 66/44/0347   Smoker 08/26/2016   Idiopathic  scoliosis 01/28/2016   Cervical spondylosis with myelopathy 07/08/2013    Conditions to be addressed/monitored:HTN, DMII, and leukopenia and thrombocytopenia  Care Plan : [facility] Care Plan     Problem: Chronic Disease Management Needs   Priority: High   Onset Date: 02/17/2021     Long-Range Goal: Work with RN Care Manager Regarding Care Management and Care Coordination Associated with Cirrhosis, Hypertension and Diabetes, Thrombocytopenia, and Anemia   Start Date: 02/17/2021  Expected End Date: 02/17/2022  This Visit's Progress: On track  Recent Progress: On track  Priority: High  Note:   Current Barriers:  Chronic Disease Management support and education needs related to HTN, DMII, and cirrhosis, pancytopenia, and anemia  RNCM Clinical Goal(s):  Patient will continue to work with RN Care Manager and/or Social Worker to address care management and care coordination needs related to HTN, DMII, and cirrhosis, thrombocytopenia, and anemia as evidenced by adherence to CM Team Scheduled appointments     through collaboration with PharmD, LCSW, provider, and care team.   Interventions: 1:1 collaboration with primary care provider regarding development and update of comprehensive plan of care as evidenced by provider attestation and co-signature Inter-disciplinary care team collaboration (see longitudinal plan of care) Evaluation of current treatment plan related to  self management and patient's adherence to plan as established by provider Provided with RN Care Manager contact information and encouraged to reach out as needed Assessed family/social support Discussed mobility and ability to perform ADLs   Thrombocytopenia/Leukopenia:  (Status: Goal on Track (progressing): YES.) Long Term Goal  Lab Results  Component Value Date   WBC 2.4 (L) 08/11/2021   HGB 11.9 08/11/2021   HCT 36.8 08/11/2021   MCV 85.4 08/11/2021   PLT 56 (L) 08/11/2021  Evaluation of current treatment plan related to thrombocytopenia and Leukopenia  Reviewed and discussed most recent visit with GI  Reviewed and discussed lab results Continues to have very low platelets and WBCs HGB returned to normal Discussed scheduled appointment for colonoscopy/endoscopy at the  end of June Assessed knowledge based on previous education provided on thrombocytopenia and cautioned to avoid falls and to seek medical attention after any falls Chart reviewed and advised patient that hematology referral has been placed but that the cancer center has not been able to reach her to schedule an appointment. Provided patient with contact number for hematology/oncology clinic and encouraged to call and schedule an appointment prior to endoscopy/colonoscopy procedure. Patient understood and was agreeable.    Diabetes:  (Status: Condition stable. Not addressed this visit.) Long Term Goal  Lab Results  Component Value Date   HGBA1C 12.4 (H) 08/27/2021   HGBA1C 6.9 (H) 05/08/2021   HGBA1C 13.0 (H) 01/16/2021   Lab Results  Component Value Date   LDLCALC 116 (H) 08/27/2021   CREATININE 0.58 06/11/2021  Assessed patient's understanding of A1c goal: <7% Provided education to patient about basic DM disease process; Reviewed medications with patient and discussed importance of medication adherence;        Counseled on importance of regular laboratory monitoring as prescribed;        Provided patient with written educational materials related to hypo and hyperglycemia and importance of correct treatment;       Advised patient, providing education and rationale, to check cbg once daily and when you have symptoms of low or high blood sugar and record        Assessed social determinant of health barriers;       Reviewed recent office notes  and lab results. Discussed significantly increased A1C and potential causes.  Reviewed and discussed medication changes Reviewed upcoming appointment with PharmD Discussed diet and provided education on ADA/Carb modified diet.  Engineer, civil (consulting) prepared and mailed to patient.  Discussed barriers to healthy eating: has pain when eating with dentures so she has to take them out and can only eat soft foods. Declined dental intervention for  now.    Hypertension: (Status: Condition stable. Not addressed this visit.) Last practice recorded BP readings:  BP Readings from Last 3 Encounters:  08/27/21 113/72  08/11/21 120/74  07/10/21 (!) 147/82   Reviewed medications with patient and discussed importance of compliance;  Counseled on the importance of exercise goals with target of 150 minutes per week Advised patient, providing education and rationale, to monitor blood pressure daily and record, calling PCP for findings outside established parameters;  Discussed complications of poorly controlled blood pressure such as heart disease, stroke, circulatory complications, vision complications, kidney impairment, sexual dysfunction;  Assessed social determinant of health barriers;    SDOH Barriers (Status: Condition stable. Not addressed this visit.) Long Term Goal  Patient interviewed and SDOH assessment performed       Discussed prescription assistance through [employer] Provided verbal education regarding Medicare Extra Help Collaborated with [place] office staff to print and mail information on how to apply for LIS Discussed upcoming appt with PharmD   Infection:  (Status: Goal Met.) Short Term Goal  Evaluation of current treatment plan regarding infection in thoracic spine Reviewed and discussed relevant notes, reports, and treatment plan Reviewed upcoming appt with infectious disease Discussed medications and updated medication list to include antibiotics and correct suboxone dose Discussed appointment with [facility] for management of chronic back pain Pain assessment performed Encouraged to reach out to pain management as needed Encouraged to reach out to infectious disease with any new or worsening symptoms    Patient Goals/Self-Care Activities: Take medications as prescribed   Attend all scheduled provider appointments Call provider office for new concerns or questions  check blood sugar at  prescribed times: once daily and when you have symptoms of low or high blood sugar check feet daily for cuts, sores or redness read food labels for fat, fiber, carbohydrates and portion size check blood pressure 3 times per week write blood pressure results in a log or diary take blood pressure log to all doctor appointments eat more whole grains, fruits and vegetables, lean meats and healthy fats Call RN Care Manager as needed [phone] Move carefully and change positions slowly to avoid falls Review educational handouts on diabetic diet recommendations Seek medical attention after any falls Call hematology/oncology office to schedule an appointment to discuss low platelets and low white blood cell count [phone]  Plan:Telephone follow up appointment with care management team member scheduled for:  10/06/21 with RNCM The patient has been provided with contact information for the care management team and has been advised to call with any health related questions or concerns.   Chong Sicilian, BSN, RN-BC Embedded Chronic Care Manager [facility] / [place] [facility] Direct Dial: [phone]

## 2021-10-10 ENCOUNTER — Ambulatory Visit (HOSPITAL_COMMUNITY)
Admission: RE | Admit: 2021-10-10 | Discharge: 2021-10-10 | Disposition: A | Discharge status: Home / Self Care | Payer: Medicare HMO | Attending: Internal Medicine | Admitting: Internal Medicine

## 2021-10-10 ENCOUNTER — Encounter (HOSPITAL_COMMUNITY)
Admission: RE | Disposition: A | Discharge status: Home / Self Care | Payer: Self-pay | Source: Home / Self Care | Attending: Internal Medicine

## 2021-10-10 ENCOUNTER — Encounter (HOSPITAL_COMMUNITY): Payer: Self-pay

## 2021-10-10 ENCOUNTER — Ambulatory Visit (HOSPITAL_COMMUNITY): Payer: Medicare HMO | Admitting: Certified Registered Nurse Anesthetist

## 2021-10-10 ENCOUNTER — Ambulatory Visit (HOSPITAL_BASED_OUTPATIENT_CLINIC_OR_DEPARTMENT_OTHER): Payer: Medicare HMO | Admitting: Certified Registered Nurse Anesthetist

## 2021-10-10 DIAGNOSIS — I10 Essential (primary) hypertension: Secondary | ICD-10-CM | POA: Insufficient documentation

## 2021-10-10 DIAGNOSIS — F17210 Nicotine dependence, cigarettes, uncomplicated: Secondary | ICD-10-CM | POA: Insufficient documentation

## 2021-10-10 DIAGNOSIS — K7460 Unspecified cirrhosis of liver: Secondary | ICD-10-CM

## 2021-10-10 DIAGNOSIS — K648 Other hemorrhoids: Secondary | ICD-10-CM | POA: Insufficient documentation

## 2021-10-10 DIAGNOSIS — K625 Hemorrhage of anus and rectum: Secondary | ICD-10-CM | POA: Insufficient documentation

## 2021-10-10 DIAGNOSIS — K766 Portal hypertension: Secondary | ICD-10-CM

## 2021-10-10 DIAGNOSIS — I2510 Atherosclerotic heart disease of native coronary artery without angina pectoris: Secondary | ICD-10-CM | POA: Insufficient documentation

## 2021-10-10 DIAGNOSIS — K3189 Other diseases of stomach and duodenum: Secondary | ICD-10-CM | POA: Diagnosis not present

## 2021-10-10 DIAGNOSIS — E119 Type 2 diabetes mellitus without complications: Secondary | ICD-10-CM | POA: Insufficient documentation

## 2021-10-10 DIAGNOSIS — J449 Chronic obstructive pulmonary disease, unspecified: Secondary | ICD-10-CM | POA: Diagnosis not present

## 2021-10-10 DIAGNOSIS — Z7985 Long-term (current) use of injectable non-insulin antidiabetic drugs: Secondary | ICD-10-CM

## 2021-10-10 DIAGNOSIS — I8510 Secondary esophageal varices without bleeding: Secondary | ICD-10-CM | POA: Diagnosis not present

## 2021-10-10 DIAGNOSIS — R1310 Dysphagia, unspecified: Secondary | ICD-10-CM | POA: Insufficient documentation

## 2021-10-10 DIAGNOSIS — I868 Varicose veins of other specified sites: Secondary | ICD-10-CM | POA: Insufficient documentation

## 2021-10-10 DIAGNOSIS — E1159 Type 2 diabetes mellitus with other circulatory complications: Secondary | ICD-10-CM | POA: Diagnosis not present

## 2021-10-10 HISTORY — PX: ESOPHAGOGASTRODUODENOSCOPY (EGD) WITH PROPOFOL: SHX5813

## 2021-10-10 HISTORY — PX: COLONOSCOPY WITH PROPOFOL: SHX5780

## 2021-10-10 LAB — GLUCOSE, CAPILLARY: Glucose-Capillary: 201 mg/dL — ABNORMAL HIGH (ref 70–99)

## 2021-10-10 SURGERY — COLONOSCOPY WITH PROPOFOL
Anesthesia: General

## 2021-10-10 MED ORDER — PROPOFOL 500 MG/50ML IV EMUL
INTRAVENOUS | Status: DC | PRN
  Administered 2021-10-10: 180 ug/kg/min via INTRAVENOUS

## 2021-10-10 MED ORDER — LACTATED RINGERS IV SOLN
INTRAVENOUS | Status: DC
  Administered 2021-10-10: 1000 mL via INTRAVENOUS

## 2021-10-10 MED ORDER — LIDOCAINE HCL (CARDIAC) PF 100 MG/5ML IV SOSY
PREFILLED_SYRINGE | INTRAVENOUS | Status: DC | PRN
  Administered 2021-10-10: 50 mg via INTRATRACHEAL

## 2021-10-10 MED ORDER — PROPOFOL 10 MG/ML IV BOLUS
INTRAVENOUS | Status: DC | PRN
  Administered 2021-10-10: 100 mg via INTRAVENOUS

## 2021-10-10 NOTE — Op Note (Signed)
[facility] Patient Name: Grace Stokes Procedure Date: 10/10/2021 8:55 AM MRN: [id number] Date of Birth: 1955-06-02 Attending MD: Elon Alas. Abbey Chatters DO CSN: [id number] Age: 66 Admit Type: Outpatient Procedure:                Colonoscopy Indications:              Rectal bleeding Providers:                Elon Alas. Stryder Poitra, DO, [place] Risa Grill,                            Technician, Everardo Pacific Referring MD:              Medicines:                See the Anesthesia note for documentation of the                            administered medications Complications:            No immediate complications. Estimated Blood Loss:     Estimated blood loss: none. Procedure:                Pre-Anesthesia Assessment:                           - The anesthesia plan was to use monitored                            anesthesia care (MAC).                           After obtaining informed consent, the colonoscope                            was passed under direct vision. Throughout the                            procedure, the patient's blood pressure, pulse, and                            oxygen saturations were monitored continuously. The                            PCF-HQ190L ([id number]) was introduced through the                            anus and advanced to the the cecum, identified by                            appendiceal orifice and ileocecal valve. The                            colonoscopy was performed without difficulty. The                            patient tolerated the procedure well. The quality  of the bowel preparation was evaluated using the                            BBPS [facility] Bowel Preparation Scale) with scores                            of: Right Colon = 2 (minor amount of residual                            staining, small fragments of stool and/or opaque                            liquid, but mucosa seen well), Transverse Colon = 2                             (minor amount of residual staining, small fragments                            of stool and/or opaque liquid, but mucosa seen                            well) and Left Colon = 2 (minor amount of residual                            staining, small fragments of stool and/or opaque                            liquid, but mucosa seen well). The total BBPS score                            equals 6. The quality of the bowel preparation was                            fair. Scope In: 9:01:30 AM Scope Out: 9:13:14 AM Scope Withdrawal Time: 0 hours 8 minutes 43 seconds  Total Procedure Duration: 0 hours 11 minutes 44 seconds  Findings:      The perianal and digital rectal examinations were normal.      Non-bleeding rectal varices were found.      The exam was otherwise without abnormality. Impression:               - Preparation of the colon was fair.                           - Rectal varices.                           - The examination was otherwise normal.                           - No specimens collected. Moderate Sedation:      Per Anesthesia Care Recommendation:           - Patient has a contact number available for  emergencies. The signs and symptoms of potential                            delayed complications were discussed with the                            patient. Return to normal activities tomorrow.                            Written discharge instructions were provided to the                            patient.                           - Resume previous diet.                           - Continue present medications.                           - Repeat colonoscopy in 10 years for screening                            purposes.                           - Return to GI clinic in 4 months. Procedure Code(s):        --- Professional ---                           [phone], Colonoscopy, flexible; diagnostic, including                             collection of specimen(s) by brushing or washing,                            when performed (separate procedure) Diagnosis Code(s):        --- Professional ---                           K64.8, Other hemorrhoids                           K62.5, Hemorrhage of anus and rectum CPT copyright 2019 American Medical Association. All rights reserved. The codes documented in this report are preliminary and upon coder review may  be revised to meet current compliance requirements. Elon Alas. Abbey Chatters, DO [place], DO 10/10/2021 9:16:02 AM This report has been signed electronically. Number of Addenda: 0

## 2021-10-10 NOTE — Discharge Instructions (Addendum)
EGD Discharge instructions Please read the instructions outlined below and refer to this sheet in the next few weeks. These discharge instructions provide you with general information on caring for yourself after you leave the hospital. Your doctor may also give you specific instructions. While your treatment has been planned according to the most current medical practices available, unavoidable complications occasionally occur. If you have any problems or questions after discharge, please call your doctor. ACTIVITY You may resume your regular activity but move at a slower pace for the next 24 hours.  Take frequent rest periods for the next 24 hours.  Walking will help expel (get rid of) the air and reduce the bloated feeling in your abdomen.  No driving for 24 hours (because of the anesthesia (medicine) used during the test).  You may shower.  Do not sign any important legal documents or operate any machinery for 24 hours (because of the anesthesia used during the test).  NUTRITION Drink plenty of fluids.  You may resume your normal diet.  Begin with a light meal and progress to your normal diet.  Avoid alcoholic beverages for 24 hours or as instructed by your caregiver.  MEDICATIONS You may resume your normal medications unless your caregiver tells you otherwise.  WHAT YOU CAN EXPECT TODAY You may experience abdominal discomfort such as a feeling of fullness or "gas" pains.  FOLLOW-UP Your doctor will discuss the results of your test with you.  SEEK IMMEDIATE MEDICAL ATTENTION IF ANY OF THE FOLLOWING OCCUR: Excessive nausea (feeling sick to your stomach) and/or vomiting.  Severe abdominal pain and distention (swelling).  Trouble swallowing.  Temperature over 101 F (37.8 C).  Rectal bleeding or vomiting of blood.     Colonoscopy Discharge Instructions  Read the instructions outlined below and refer to this sheet in the next few weeks. These discharge instructions provide you with  general information on caring for yourself after you leave the hospital. Your doctor may also give you specific instructions. While your treatment has been planned according to the most current medical practices available, unavoidable complications occasionally occur.   ACTIVITY You may resume your regular activity, but move at a slower pace for the next 24 hours.  Take frequent rest periods for the next 24 hours.  Walking will help get rid of the air and reduce the bloated feeling in your belly (abdomen).  No driving for 24 hours (because of the medicine (anesthesia) used during the test).   Do not sign any important legal documents or operate any machinery for 24 hours (because of the anesthesia used during the test).  NUTRITION Drink plenty of fluids.  You may resume your normal diet as instructed by your doctor.  Begin with a light meal and progress to your normal diet. Heavy or fried foods are harder to digest and may make you feel sick to your stomach (nauseated).  Avoid alcoholic beverages for 24 hours or as instructed.  MEDICATIONS You may resume your normal medications unless your doctor tells you otherwise.  WHAT YOU CAN EXPECT TODAY Some feelings of bloating in the abdomen.  Passage of more gas than usual.  Spotting of blood in your stool or on the toilet paper.  IF YOU HAD POLYPS REMOVED DURING THE COLONOSCOPY: No aspirin products for 7 days or as instructed.  No alcohol for 7 days or as instructed.  Eat a soft diet for the next 24 hours.  FINDING OUT THE RESULTS OF YOUR TEST Not all test results are  available during your visit. If your test results are not back during the visit, make an appointment with your caregiver to find out the results. Do not assume everything is normal if you have not heard from your caregiver or the medical facility. It is important for you to follow up on all of your test results.  SEEK IMMEDIATE MEDICAL ATTENTION IF: You have more than a spotting of  blood in your stool.  Your belly is swollen (abdominal distention).  You are nauseated or vomiting.  You have a temperature over 101.  You have abdominal pain or discomfort that is severe or gets worse throughout the day.   Your EGD revealed esophageal varices.  2 columns were too small to do anything about.  You did have 1 column that is potentially treatable with banding though given your thrombocytopenia, I think we should with medications instead.  We will reach out to [facility] in regards to this.  Your colonoscopy was unremarkable.  I did not find a polyps or evidence of colon cancer.  You do have rectal varices/hemorrhoid disease which is likely causing your rectal bleeding.  We will continue to monitor this.  I am happy to see that your hemoglobin has improved.  I hope you have a great rest of your week!  Grace Stokes. Abbey Chatters, D.O. Gastroenterology and Hepatology [facility]

## 2021-10-10 NOTE — Transfer of Care (Signed)
Immediate Anesthesia Transfer of Care Note  Patient: Grace Stokes  Procedure(s) Performed: COLONOSCOPY WITH PROPOFOL ESOPHAGOGASTRODUODENOSCOPY (EGD) WITH PROPOFOL  Patient Location: Short Stay  Anesthesia Type:MAC  Level of Consciousness: sedated  Airway & Oxygen Therapy: Patient Spontanous Breathing  Post-op Assessment: Report given to RN, Post -op Vital signs reviewed and stable and Patient moving all extremities X 4  Post vital signs: Reviewed and stable  Last Vitals:  Vitals Value Taken Time  BP    Temp    Pulse    Resp    SpO2      Last Pain:  Vitals:   10/10/21 0734  TempSrc: Oral  PainSc: 6       Patients Stated Pain Goal: 8 (88/71/95 9747)  Complications: No notable events documented.

## 2021-10-10 NOTE — Op Note (Addendum)
[facility] Patient Name: Grace Stokes Procedure Date: 10/10/2021 8:36 AM MRN: [id number] Date of Birth: 10-16-1955 Attending MD: Elon Alas. Edgar Frisk CSN: [id number] Age: 66 Admit Type: Outpatient Procedure:                Upper GI endoscopy Indications:              Dysphagia, Cirrhosis rule out esophageal varices Providers:                Elon Alas. Abbey Chatters, DO, Lurline Del, RN, Kristine L.                            Risa Grill, Technician, Everardo Pacific Referring MD:              Medicines:                See the Anesthesia note for documentation of the                            administered medications Complications:            No immediate complications. Estimated Blood Loss:     Estimated blood loss: none. Procedure:                Pre-Anesthesia Assessment:                           - The anesthesia plan was to use monitored                            anesthesia care (MAC).                           After obtaining informed consent, the endoscope was                            passed under direct vision. Throughout the                            procedure, the patient's blood pressure, pulse, and                            oxygen saturations were monitored continuously. The                            GIF-H190 ([id number]) scope was introduced through the                            mouth, and advanced to the second part of duodenum.                            The upper GI endoscopy was accomplished without                            difficulty. The patient tolerated the procedure  well. Scope In: 8:50:17 AM Scope Out: 8:54:47 AM Total Procedure Duration: 0 hours 4 minutes 30 seconds  Findings:      Two columns of grade I varices with no bleeding and no stigmata of       recent bleeding were found in the lower third of the esophagus. One       column of grade II varix. No red wale signs were present. No high risk       stigmata. Given  thrombocytopenia with plts <50 and no high risk       features, elected not to band today. For primary ppx, would recommend       NSBB.      Moderate portal hypertensive gastropathy was found in the entire       examined stomach.      The duodenal bulb, first portion of the duodenum and second portion of       the duodenum were normal. Impression:               - Grade I and grade II esophageal varices with no                            bleeding and no stigmata of recent bleeding.                           - Portal hypertensive gastropathy.                           - Normal duodenal bulb, first portion of the                            duodenum and second portion of the duodenum.                           - No specimens collected. Moderate Sedation:      Per Anesthesia Care Recommendation:           - Patient has a contact number available for                            emergencies. The signs and symptoms of potential                            delayed complications were discussed with the                            patient. Return to normal activities tomorrow.                            Written discharge instructions were provided to the                            patient.                           - Resume previous diet.                           -  Continue present medications.                           - Recommend NSBB to optimize HR to 55-60 for                            primary ppx, Procedure Code(s):        --- Professional ---                           [phone], Esophagogastroduodenoscopy, flexible,                            transoral; diagnostic, including collection of                            specimen(s) by brushing or washing, when performed                            (separate procedure) Diagnosis Code(s):        --- Professional ---                           K74.60, Unspecified cirrhosis of liver                           I85.10, Secondary esophageal varices without                             bleeding                           K76.6, Portal hypertension                           K31.89, Other diseases of stomach and duodenum                           R13.10, Dysphagia, unspecified CPT copyright 2019 American Medical Association. All rights reserved. The codes documented in this report are preliminary and upon coder review may  be revised to meet current compliance requirements. Elon Alas. Abbey Chatters, DO [place] Mcbain, DO 10/10/2021 9:00:48 AM This report has been signed electronically. Number of Addenda: 0

## 2021-10-10 NOTE — H&P (Signed)
Primary Care Physician:  Loman Brooklyn, FNP Primary Gastroenterologist:  Dr. Abbey Chatters  Pre-Procedure History & Physical: HPI:  Grace Stokes is a 66 y.o. female is here for an EGD with possible dilation, possible banding for chronic dysphagia, cirrhosis/variceal screening, and a colonoscopy for rectal bleeding.  Past Medical History:  Diagnosis Date   Anemia    Arthritis    Bell's palsy    neuropathy    CAD (coronary artery disease)    Cataract    Chronic pain    Cirrhosis (HCC)    COPD (chronic obstructive pulmonary disease) (HCC)    Depression    Diabetes mellitus type 2 in nonobese ([place])    Hypertension    IV drug abuse ([place])    Scoliosis     Past Surgical History:  Procedure Laterality Date   ANTERIOR CERVICAL DECOMP/DISCECTOMY FUSION N/A 07/09/2013   Procedure: ANTERIOR CERVICAL DECOMPRESSION/DISCECTOMY FUSION 2 LEVELS;  Surgeon: Consuella Lose, MD;  Location: [facility];  Service: Neurosurgery;  Laterality: N/A;  Anterior Cervical Fusion Cervical five-six, six-seven.   BACK SURGERY     x 5   CARPAL TUNNEL RELEASE     CESAREAN SECTION     COLONOSCOPY  2017   [facility]; external and internal hemorrhoids, 1 small polyp in the descending colon resected, but not retrieved.   ELBOW SURGERY Left    I & D EXTREMITY Left 12/17/2016   Procedure: IRRIGATION AND DEBRIDEMENT LEFT HAND;  Surgeon: Iran Planas, MD;  Location: [place];  Service: Orthopedics;  Laterality: Left;   INCISION / DRAINAGE HAND / FINGER Left 12/17/2016   ROTATOR CUFF REPAIR     SHOULDER SURGERY     TUBAL LIGATION      Prior to Admission medications   Medication Sig Start Date End Date Taking? Authorizing Provider  Buprenorphine HCl-Naloxone HCl (SUBOXONE) 8-2 MG FILM Place 1 Film under the tongue in the morning and at bedtime.   Yes [provider]  Dulaglutide (TRULICITY) 1.5 ZD/6.6YQ SOPN Inject 1.5 mg into the skin every Sunday.   Yes [provider]  entecavir (BARACLUDE)  1 MG tablet Take 1 mg by mouth at bedtime. 02/10/21 02/05/22 Yes [provider]  Evolocumab (REPATHA SURECLICK) 034 MG/ML SOAJ Inject 140 mg into the skin every 14 (fourteen) days. 08/27/21  Yes Loman Brooklyn, FNP  fluticasone (FLONASE) 50 MCG/ACT nasal spray Place 1 spray into both nostrils 2 (two) times daily as needed for allergies or rhinitis. 10/02/21  Yes Dettinger, Fransisca Kaufmann, MD  lisinopril (ZESTRIL) 20 MG tablet Take 1 tablet (20 mg total) by mouth daily. Patient taking differently: Take 20 mg by mouth at bedtime. 05/08/21  Yes Hendricks Limes F, FNP  metoprolol succinate (TOPROL-XL) 100 MG 24 hr tablet Take 1 tablet (100 mg total) by mouth daily. Take with or immediately following a meal. 09/17/21  Yes Hendricks Limes F, FNP  mirtazapine (REMERON) 15 MG tablet Take 1 tablet (15 mg total) by mouth at bedtime. 09/17/21  Yes Hendricks Limes F, FNP  Vitamin D, Ergocalciferol, (DRISDOL) 1.25 MG (50000 UNIT) CAPS capsule Take 1 capsule (50,000 Units total) by mouth every 7 (seven) days. Patient taking differently: Take 50,000 Units by mouth every Sunday. 08/28/21  Yes Hendricks Limes F, FNP  amoxicillin (AMOXIL) 500 MG capsule Take 1 capsule (500 mg total) by mouth 2 (two) times daily. 10/02/21   Dettinger, Fransisca Kaufmann, MD    Allergies as of 08/29/2021 - Review Complete 08/27/2021  Allergen Reaction Noted  Gabapentin Other (See Comments) 07/31/2019    Family History  Problem Relation Age of Onset   COPD Mother    Stroke Mother    Arthritis Brother        back issues   Cancer Brother        skin / back    Obesity Son    Colon cancer Neg Hx     Social History   Socioeconomic History   Marital status: Married    Spouse name: Tim   Number of children: 1   Years of education: Not on file   Highest education level: Not on file  Occupational History   Occupation: disability   Tobacco Use   Smoking status: Every Day    Packs/day: 0.50    Types: Cigarettes   Smokeless tobacco: Never   Vaping Use   Vaping Use: Never used  Substance and Sexual Activity   Alcohol use: Not Currently   Drug use: Not Currently    Comment: Previously injected illicit Fentanyl.  Clean since February 2023.   Sexual activity: Not on file  Other Topics Concern   Not on file  Social History Narrative   Son lives 15 minutes away and hasn't visited in over a year.   Social Determinants of Health   Financial Resource Strain: High Risk (12/25/2020)   Overall Financial Resource Strain (CARDIA)    Difficulty of Paying Living Expenses: Hard  Food Insecurity: Food Insecurity Present (01/08/2021)   Hunger Vital Sign    Worried About Running Out of Food in the Last Year: Sometimes true    Ran Out of Food in the Last Year: Sometimes true  Transportation Needs: No Transportation Needs (12/25/2020)   PRAPARE - Hydrologist (Medical): No    Lack of Transportation (Non-Medical): No  Physical Activity: Inactive (07/10/2021)   Exercise Vital Sign    Days of Exercise per Week: 0 days    Minutes of Exercise per Session: 0 min  Stress: Stress Concern Present (07/10/2021)   [place]    Feeling of Stress : Rather much  Social Connections: Moderately Isolated (12/25/2020)   Social Connection and Isolation Panel [NHANES]    Frequency of Communication with Friends and Family: More than three times a week    Frequency of Social Gatherings with Friends and Family: Twice a week    Attends Religious Services: Never    Marine scientist or Organizations: No    Attends Archivist Meetings: Never    Marital Status: Married  Human resources officer Violence: Not At Risk (12/25/2020)   Humiliation, Afraid, Rape, and Kick questionnaire    Fear of Current or Ex-Partner: No    Emotionally Abused: No    Physically Abused: No    Sexually Abused: No    Review of Systems: See HPI, otherwise negative ROS  Physical  Exam: Vital signs in last 24 hours: Temp:  [98.3 F (36.8 C)] 98.3 F (36.8 C) (06/30 0734) Pulse Rate:  [85] 85 (06/30 0734) Resp:  [18] 18 (06/30 0734) BP: (152)/(69) 152/69 (06/30 0734) SpO2:  [97 %] 97 % (06/30 0734) Weight:  [57.6 kg] 57.6 kg (06/30 0734)   General:   Alert,  Well-developed, well-nourished, pleasant and cooperative in NAD Head:  Normocephalic and atraumatic. Eyes:  Sclera clear, no icterus.   Conjunctiva pink. Ears:  Normal auditory acuity. Nose:  No deformity, discharge,  or lesions. Mouth:  No deformity  or lesions, dentition normal. Neck:  Supple; no masses or thyromegaly. Lungs:  Clear throughout to auscultation.   No wheezes, crackles, or rhonchi. No acute distress. Heart:  Regular rate and rhythm; no murmurs, clicks, rubs,  or gallops. Abdomen:  Soft, nontender and nondistended. No masses, hepatosplenomegaly or hernias noted. Normal bowel sounds, without guarding, and without rebound.   Msk:  Symmetrical without gross deformities. Normal posture. Extremities:  Without clubbing or edema. Neurologic:  Alert and  oriented x4;  grossly normal neurologically. Skin:  Intact without significant lesions or rashes. Cervical Nodes:  No significant cervical adenopathy. Psych:  Alert and cooperative. Normal mood and affect.  Impression/Plan: Jazarah Capili is here for an EGD with possible dilation, possible banding for chronic dysphagia, cirrhosis/variceal screening, and a colonoscopy for rectal bleeding.  The risks of the procedure including infection, bleed, or perforation as well as benefits, limitations, alternatives and imponderables have been reviewed with the patient. Questions have been answered. All parties agreeable.

## 2021-10-10 NOTE — Anesthesia Preprocedure Evaluation (Signed)
Anesthesia Evaluation  Patient identified by MRN, date of birth, ID band Patient awake    Reviewed: Allergy & Precautions, H&P , NPO status , Patient's Chart, lab work & pertinent test results, reviewed documented beta blocker date and time   Airway Mallampati: II  TM Distance: >3 FB Neck ROM: full    Dental no notable dental hx.    Pulmonary COPD, Current Smoker and Patient abstained from smoking.,    Pulmonary exam normal breath sounds clear to auscultation       Cardiovascular Exercise Tolerance: Good hypertension, + CAD   Rhythm:regular Rate:Normal     Neuro/Psych PSYCHIATRIC DISORDERS Depression  Neuromuscular disease    GI/Hepatic negative GI ROS, (+) Cirrhosis   Esophageal Varices    , Hepatitis -, C  Endo/Other  negative endocrine ROSdiabetes  Renal/GU negative Renal ROS  negative genitourinary   Musculoskeletal   Abdominal   Peds  Hematology  (+) Blood dyscrasia, anemia ,   Anesthesia Other Findings   Reproductive/Obstetrics negative OB ROS                             Anesthesia Physical Anesthesia Plan  ASA: 3  Anesthesia Plan: General   Post-op Pain Management:    Induction:   PONV Risk Score and Plan: Propofol infusion  Airway Management Planned:   Additional Equipment:   Intra-op Plan:   Post-operative Plan:   Informed Consent: I have reviewed the patients History and Physical, chart, labs and discussed the procedure including the risks, benefits and alternatives for the proposed anesthesia with the patient or authorized representative who has indicated his/her understanding and acceptance.     Dental Advisory Given  Plan Discussed with: CRNA  Anesthesia Plan Comments:         Anesthesia Quick Evaluation

## 2021-10-11 NOTE — Anesthesia Postprocedure Evaluation (Signed)
Anesthesia Post Note  Patient: Grace Stokes  Procedure(s) Performed: COLONOSCOPY WITH PROPOFOL ESOPHAGOGASTRODUODENOSCOPY (EGD) WITH PROPOFOL  Patient location during evaluation: Phase II Anesthesia Type: General Level of consciousness: awake Pain management: pain level controlled Vital Signs Assessment: post-procedure vital signs reviewed and stable Respiratory status: spontaneous breathing and respiratory function stable Cardiovascular status: blood pressure returned to baseline and stable Postop Assessment: no headache and no apparent nausea or vomiting Anesthetic complications: no Comments: Late entry   No notable events documented.   Last Vitals:  Vitals:   10/10/21 0915 10/10/21 0921  BP: (!) 142/37 118/67  Pulse: 90   Resp: 14   Temp: 36.5 C   SpO2: 94%     Last Pain:  Vitals:   10/10/21 0915  TempSrc: Oral  PainSc: 0-No pain                 Louann Sjogren

## 2021-10-13 ENCOUNTER — Telehealth: Payer: Self-pay | Admitting: Gastroenterology

## 2021-10-13 ENCOUNTER — Ambulatory Visit (INDEPENDENT_AMBULATORY_CARE_PROVIDER_SITE_OTHER): Payer: Medicare HMO | Admitting: *Deleted

## 2021-10-13 DIAGNOSIS — I10 Essential (primary) hypertension: Secondary | ICD-10-CM

## 2021-10-13 DIAGNOSIS — D696 Thrombocytopenia, unspecified: Secondary | ICD-10-CM

## 2021-10-13 DIAGNOSIS — K649 Unspecified hemorrhoids: Secondary | ICD-10-CM

## 2021-10-13 DIAGNOSIS — E1165 Type 2 diabetes mellitus with hyperglycemia: Secondary | ICD-10-CM

## 2021-10-13 NOTE — Patient Instructions (Signed)
Visit Information  Thank you for taking time to visit with me today. Please don't hesitate to contact me if I can be of assistance to you before our next scheduled telephone appointment.  Following are the goals we discussed today:  Take medications as prescribed   Attend all scheduled provider appointments Call provider office for new concerns or questions  check blood sugar at prescribed times: once daily and when you have symptoms of low or high blood sugar check feet daily for cuts, sores or redness read food labels for fat, fiber, carbohydrates and portion size check blood pressure 3 times per week write blood pressure results in a log or diary take blood pressure log to all doctor appointments eat more whole grains, fruits and vegetables, lean meats and healthy fats Call RN Care Manager as needed [phone] Move carefully and change positions slowly to avoid falls Seek medical attention after any falls Monitor stool for any signs of GI bleeding. Bright red or black tarry stools or vomiting "coffee grounds" Call GI or PCP with any new or worsening symptoms Seek emergency medical attention if needed  Our next appointment is by telephone on 11/13/21 at 12:45  Please call the care guide team at [phone] if you need to cancel or reschedule your appointment.   If you are experiencing a Mental Health or [place] or need someone to talk to, please call the [facility] ([place]): [phone] call [phone]   Patient verbalizes understanding of instructions and care plan provided today and agrees to view in [name]. Active MyChart status and patient understanding of how to access instructions and care plan via MyChart confirmed with patient.     Grace Stokes, BSN, RN-BC Embedded Chronic Care Manager [facility] / [facility] Direct Dial: [phone]

## 2021-10-13 NOTE — Telephone Encounter (Signed)
Received notification from Dr. Abbey Chatters that patient had  columns of grade 1 varices.  She did have 1 column of grade 2 varix which he elected not to band given that her platelets were less than 50 and no prior GI bleeding. Colonoscopy unremarkable besides rectal varices. Recommended NSBB for primary prophylaxis. Stated may need to consider changing metoprolol to carvedilol instead. Recommended notifying Roosevelt Locks, NP who is following patient for cirrhosis care.    Grace Stokes:  Please send EGD and colonoscopy reports to Roosevelt Locks, NP with [facility] in [place].   Please also send the above documentation/notification from Dr. Abbey Chatters.   I am also going to notify [facility] personally.

## 2021-10-13 NOTE — Chronic Care Management (AMB) (Signed)
Erroneous encounter. Please disregard.

## 2021-10-13 NOTE — Chronic Care Management (AMB) (Signed)
Chronic Care Management   CCM RN Visit Note  10/13/2021 Name: Grace Stokes MRN: [id number] DOB: 29-Oct-1955  Subjective: Grace Stokes is a 66 y.o. year old female who is a primary care patient of Loman Brooklyn, FNP. The care management team was consulted for assistance with disease management and care coordination needs.    Engaged with patient by telephone for follow up visit in response to provider referral for case management and/or care coordination services.   Consent to Services:  The patient was given information about Chronic Care Management services, agreed to services, and gave verbal consent prior to initiation of services.  Please see initial visit note for detailed documentation.   Patient agreed to services and verbal consent obtained.   Assessment: Review of patient past medical history, allergies, medications, health status, including review of consultants reports, laboratory and other test data, was performed as part of comprehensive evaluation and provision of chronic care management services.   SDOH (Social Determinants of Health) assessments and interventions performed:    CCM Care Plan  Allergies  Allergen Reactions   Gabapentin Other (See Comments)    Uncontrolled shakes / cold intolerance     Outpatient Encounter Medications as of 10/13/2021  Medication Sig   amoxicillin (AMOXIL) 500 MG capsule Take 1 capsule (500 mg total) by mouth 2 (two) times daily.   Buprenorphine HCl-Naloxone HCl (SUBOXONE) 8-2 MG FILM Place 1 Film under the tongue in the morning and at bedtime.   Dulaglutide (TRULICITY) 1.5 YP/9.5KD SOPN Inject 1.5 mg into the skin every Sunday.   entecavir (BARACLUDE) 1 MG tablet Take 1 mg by mouth at bedtime.   Evolocumab (REPATHA SURECLICK) 326 MG/ML SOAJ Inject 140 mg into the skin every 14 (fourteen) days.   fluticasone (FLONASE) 50 MCG/ACT nasal spray Place 1 spray into both nostrils 2 (two) times daily as needed for allergies or  rhinitis.   lisinopril (ZESTRIL) 20 MG tablet Take 1 tablet (20 mg total) by mouth daily. (Patient taking differently: Take 20 mg by mouth at bedtime.)   metoprolol succinate (TOPROL-XL) 100 MG 24 hr tablet Take 1 tablet (100 mg total) by mouth daily. Take with or immediately following a meal.   mirtazapine (REMERON) 15 MG tablet Take 1 tablet (15 mg total) by mouth at bedtime.   Vitamin D, Ergocalciferol, (DRISDOL) 1.25 MG (50000 UNIT) CAPS capsule Take 1 capsule (50,000 Units total) by mouth every 7 (seven) days. (Patient taking differently: Take 50,000 Units by mouth every Sunday.)   No facility-administered encounter medications on file as of 10/13/2021.    Patient Active Problem List   Diagnosis Date Noted   Vitamin D deficiency 08/31/2021   Vitamin A deficiency 08/31/2021   Constipation 08/11/2021   Dysphagia 08/11/2021   Malnutrition of moderate degree 05/17/2021   Chronic pain 05/15/2021   CAD (coronary artery disease) 05/15/2021   Discitis of thoracic region 05/15/2021   DNR (do not resuscitate) 05/15/2021   Pressure injury of left buttock, stage 2 ([place]) 05/11/2021   Aortic atherosclerosis ([place]) 02/18/2021   Moderate episode of recurrent major depressive disorder ([place]) 02/18/2021   Tachycardia 02/18/2021   Statins contraindicated 01/16/2021   Cirrhosis of liver without ascites ([place]) 10/23/2020   History of drug abuse ([place]) 10/23/2020   Mixed hyperlipidemia 04/18/2019   Other pancytopenia ([place]) 03/02/2019   Thrombocytopenia ([place])    Insomnia due to medical condition 09/22/2018   Chronic active viral hepatitis B ([place]) 01/26/2017   Uncontrolled type 2 diabetes mellitus with  hyperglycemia, without long-term current use of insulin ([place]) 01/13/2017   Essential hypertension 12/17/2016   Heart murmur, systolic 96/78/9381   Smoker 08/26/2016   Idiopathic scoliosis 01/28/2016   Cervical spondylosis with myelopathy 07/08/2013    Conditions to be addressed/monitored:HTN, DMII, and  cirrhosis, thrombocytopenia, anemia  Care Plan : [facility] Care Plan  Updates made by Ilean China, RN since 10/13/2021 12:00 AM     Problem: Chronic Disease Management Needs   Priority: High  Onset Date: 02/17/2021     Long-Range Goal: Work with RN Care Manager Regarding Care Management and Care Coordination Associated with Cirrhosis, Hypertension and Diabetes, Thrombocytopenia, and Anemia   Start Date: 02/17/2021  Expected End Date: 02/17/2022  This Visit's Progress: On track  Recent Progress: On track  Priority: High  Note:   Current Barriers:  Chronic Disease Management support and education needs related to HTN, DMII, and cirrhosis, pancytopenia, and anemia  RNCM Clinical Goal(s):  Patient will continue to work with RN Care Manager and/or Social Worker to address care management and care coordination needs related to HTN, DMII, and cirrhosis, thrombocytopenia, and anemia as evidenced by adherence to CM Team Scheduled appointments     through collaboration with PharmD, LCSW, provider, and care team.   Interventions: 1:1 collaboration with primary care provider regarding development and update of comprehensive plan of care as evidenced by provider attestation and co-signature Inter-disciplinary care team collaboration (see longitudinal plan of care) Evaluation of current treatment plan related to  self management and patient's adherence to plan as established by provider Provided with RN Care Manager contact information and encouraged to reach out as needed Assessed family/social support   Esophageal/Rectal Varices:  (Status: New goal.) Long Term Goal  Reviewed and discussed recent colonoscopy and endoscopy report Non-bleeding esophageal and rectal varices. Opted to not band them due to risk for bleeding associated with thrombocytopenia.  No biopsies Recommended 4 month GI follow-up Advised patient to call and schedule 4 month f/u. Patient agreeable Reviewed and discussed  medications. Patient does not take anything for GERD. She does take Tums periodically for heartburn.  Provided verbal education on s/s of GI bleed. Advised to reach out to PCP or GI or seek emergency medical attention if she develops any rectal bleeding, black/tarry stools, coffee ground emesis, etc.   Thrombocytopenia/Leukopenia:  (Status: Goal on Track (progressing): YES.) Long Term Goal  Lab Results  Component Value Date   WBC 2.7 (L) 10/08/2021   HGB 12.7 10/08/2021   HCT 36.2 10/08/2021   MCV 85.8 10/08/2021   PLT 47 (L) 10/08/2021  Discussed chronic nature of thrombocytopenia secondary to cirrhosis  Assessed knowledge based on previous education provided on thrombocytopenia and cautioned to avoid falls and to seek medical attention after any falls Provided verbal education on s/s of GI bleeding   Diabetes:  (Status: Condition stable. Not addressed this visit.) Long Term Goal  Lab Results  Component Value Date   HGBA1C 12.4 (H) 08/27/2021   HGBA1C 6.9 (H) 05/08/2021   HGBA1C 13.0 (H) 01/16/2021   Lab Results  Component Value Date   LDLCALC 116 (H) 08/27/2021   CREATININE 0.58 06/11/2021  Assessed patient's understanding of A1c goal: <7% Provided education to patient about basic DM disease process; Reviewed medications with patient and discussed importance of medication adherence;        Counseled on importance of regular laboratory monitoring as prescribed;        Provided patient with written educational materials related to hypo and hyperglycemia  and importance of correct treatment;       Advised patient, providing education and rationale, to check cbg once daily and when you have symptoms of low or high blood sugar and record        Assessed social determinant of health barriers;       Reviewed recent office notes and lab results. Discussed significantly increased A1C and potential causes.  Reviewed and discussed medication changes Reviewed upcoming appointment with  PharmD Discussed diet and provided education on ADA/Carb modified diet.  Engineer, civil (consulting) prepared and mailed to patient.  Discussed barriers to healthy eating: has pain when eating with dentures so she has to take them out and can only eat soft foods. Declined dental intervention for now.    Hypertension: (Status: Condition stable. Not addressed this visit.) Last practice recorded BP readings:  BP Readings from Last 3 Encounters:  08/27/21 113/72  08/11/21 120/74  07/10/21 (!) 147/82   Reviewed medications with patient and discussed importance of compliance;  Counseled on the importance of exercise goals with target of 150 minutes per week Advised patient, providing education and rationale, to monitor blood pressure daily and record, calling PCP for findings outside established parameters;  Discussed complications of poorly controlled blood pressure such as heart disease, stroke, circulatory complications, vision complications, kidney impairment, sexual dysfunction;  Assessed social determinant of health barriers;    SDOH Barriers (Status: Condition stable. Not addressed this visit.) Long Term Goal  Patient interviewed and SDOH assessment performed       Discussed prescription assistance through [employer] Provided verbal education regarding Medicare Extra Help Collaborated with [place] office staff to print and mail information on how to apply for LIS Discussed upcoming appt with PharmD   Infection:  (Status: Goal Met.) Short Term Goal  Evaluation of current treatment plan regarding infection in thoracic spine Reviewed and discussed relevant notes, reports, and treatment plan Reviewed upcoming appt with infectious disease Discussed medications and updated medication list to include antibiotics and correct suboxone dose Discussed appointment with [facility] for management of chronic back pain Pain assessment performed Encouraged to reach out to pain  management as needed Encouraged to reach out to infectious disease with any new or worsening symptoms    Patient Goals/Self-Care Activities: Take medications as prescribed   Attend all scheduled provider appointments Call provider office for new concerns or questions  check blood sugar at prescribed times: once daily and when you have symptoms of low or high blood sugar check feet daily for cuts, sores or redness read food labels for fat, fiber, carbohydrates and portion size check blood pressure 3 times per week write blood pressure results in a log or diary take blood pressure log to all doctor appointments eat more whole grains, fruits and vegetables, lean meats and healthy fats Call RN Care Manager as needed [phone] Move carefully and change positions slowly to avoid falls Seek medical attention after any falls Monitor stool for any signs of GI bleeding. Bright red or black tarry stools or vomiting "coffee grounds" Call GI or PCP with any new or worsening symptoms Seek emergency medical attention if needed  Plan:Telephone follow up appointment with care management team member scheduled for:  11/13/21 at 12:45 with [facility] The patient has been provided with contact information for the care management team and has been advised to call with any health related questions or concerns.   Chong Sicilian, BSN, RN-BC Embedded Chronic Care Manager [facility] [facility]  Care Management Direct Dial: [phone]

## 2021-10-15 ENCOUNTER — Ambulatory Visit: Payer: Medicare HMO | Admitting: Licensed Clinical Social Worker

## 2021-10-15 DIAGNOSIS — F331 Major depressive disorder, recurrent, moderate: Secondary | ICD-10-CM

## 2021-10-15 DIAGNOSIS — G4701 Insomnia due to medical condition: Secondary | ICD-10-CM

## 2021-10-15 DIAGNOSIS — I10 Essential (primary) hypertension: Secondary | ICD-10-CM

## 2021-10-15 DIAGNOSIS — E782 Mixed hyperlipidemia: Secondary | ICD-10-CM

## 2021-10-15 DIAGNOSIS — D649 Anemia, unspecified: Secondary | ICD-10-CM

## 2021-10-15 DIAGNOSIS — E1165 Type 2 diabetes mellitus with hyperglycemia: Secondary | ICD-10-CM

## 2021-10-15 DIAGNOSIS — D696 Thrombocytopenia, unspecified: Secondary | ICD-10-CM

## 2021-10-15 NOTE — Patient Instructions (Addendum)
Visit Information  Patient Goals:  Manage Emotions; Manage depression issues; manage stress issues  Timeframe:  Short-Term Goal Priority:  Medium  Progress: On Track Start Date:             07/10/21                Expected End Date:           01/06/22                Follow Up Date 11/25/21 at 2:00 PM    Manage emotions; manage depression issues; manage stress issues    Why is this important?   When you are stressed, down or upset, your body reacts too.  For example, your blood pressure may get higher; you may have a headache or stomachache.  When your emotions get the best of you, your body's ability to fight off cold and flu gets weak.  These steps will help you manage your emotions.     Patient Self Care Activities:  Self administers medications as prescribed Attends all scheduled provider appointments Performs ADL's independently Performs IADL's independently  Patient Coping Strengths:  Family Friends  Patient Self Care Deficits:  Mobility issues Pain issues Food procurement challenges Transport challenges  Patient Goals:  - spend time or talk with others at least 2 to 3 times per week - practice relaxation or meditation daily - keep a calendar with appointment dates  Follow Up Plan:  LCSW to call client or spouse of client on 11/25/21 at 2:00 PM   [place].Xochilt Conant MSW, [place] Holiday representative [facility] Care Management [phone]

## 2021-10-15 NOTE — Chronic Care Management (AMB) (Signed)
Chronic Care Management    Clinical Social Work Note  10/15/2021 Name: Grace Stokes MRN: [id number] DOB: [birth year]  Grace Stokes is a 66 y.o. year old female who is a primary care patient of Loman Brooklyn, FNP. The CCM team was consulted to assist the patient with chronic disease management and/or care coordination needs related to: [employer] .   Engaged with patient by telephone for follow up visit in response to provider referral for social work chronic care management and care coordination services.   Consent to Services:  The patient was given information about Chronic Care Management services, agreed to services, and gave verbal consent prior to initiation of services.  Please see initial visit note for detailed documentation.   Patient agreed to services and consent obtained.   Assessment: Review of patient past medical history, allergies, medications, and health status, including review of relevant consultants reports was performed today as part of a comprehensive evaluation and provision of chronic care management and care coordination services.     SDOH (Social Determinants of Health) assessments and interventions performed:  SDOH Interventions    Flowsheet Row Most Recent Value  SDOH Interventions   Physical Activity Interventions Other (Comments)  [walking challenges. uses a cane or a walker as needed to help her walk]  Stress Interventions Provide Counseling  [client has stress related to difficulty sleeping. client has stress related to managing medical conditons]  Depression Interventions/Treatment  Counseling        Advanced Directives Status: See Vynca application for related entries.  CCM Care Plan  Allergies  Allergen Reactions   Gabapentin Other (See Comments)    Uncontrolled shakes / cold intolerance     Outpatient Encounter Medications as of 10/15/2021  Medication Sig   amoxicillin (AMOXIL) 500 MG capsule Take 1 capsule (500 mg  total) by mouth 2 (two) times daily.   Buprenorphine HCl-Naloxone HCl (SUBOXONE) 8-2 MG FILM Place 1 Film under the tongue in the morning and at bedtime.   Dulaglutide (TRULICITY) 1.5 YT/0.1SW SOPN Inject 1.5 mg into the skin every Sunday.   entecavir (BARACLUDE) 1 MG tablet Take 1 mg by mouth at bedtime.   Evolocumab (REPATHA SURECLICK) 109 MG/ML SOAJ Inject 140 mg into the skin every 14 (fourteen) days.   fluticasone (FLONASE) 50 MCG/ACT nasal spray Place 1 spray into both nostrils 2 (two) times daily as needed for allergies or rhinitis.   lisinopril (ZESTRIL) 20 MG tablet Take 1 tablet (20 mg total) by mouth daily. (Patient taking differently: Take 20 mg by mouth at bedtime.)   metoprolol succinate (TOPROL-XL) 100 MG 24 hr tablet Take 1 tablet (100 mg total) by mouth daily. Take with or immediately following a meal.   mirtazapine (REMERON) 15 MG tablet Take 1 tablet (15 mg total) by mouth at bedtime.   Vitamin D, Ergocalciferol, (DRISDOL) 1.25 MG (50000 UNIT) CAPS capsule Take 1 capsule (50,000 Units total) by mouth every 7 (seven) days. (Patient taking differently: Take 50,000 Units by mouth every Sunday.)   No facility-administered encounter medications on file as of 10/15/2021.    Patient Active Problem List   Diagnosis Date Noted   Vitamin D deficiency 08/31/2021   Vitamin A deficiency 08/31/2021   Constipation 08/11/2021   Dysphagia 08/11/2021   Malnutrition of moderate degree 05/17/2021   Chronic pain 05/15/2021   CAD (coronary artery disease) 05/15/2021   Discitis of thoracic region 05/15/2021   DNR (do not resuscitate) 05/15/2021   Pressure injury of left buttock,  stage 2 ([place]) 05/11/2021   Aortic atherosclerosis ([place]) 02/18/2021   Moderate episode of recurrent major depressive disorder ([place]) 02/18/2021   Tachycardia 02/18/2021   Statins contraindicated 01/16/2021   Cirrhosis of liver without ascites ([place]) 10/23/2020   History of drug abuse ([place]) 10/23/2020   Mixed  hyperlipidemia 04/18/2019   Other pancytopenia ([place]) 03/02/2019   Thrombocytopenia ([place])    Insomnia due to medical condition 09/22/2018   Chronic active viral hepatitis B ([place]) 01/26/2017   Uncontrolled type 2 diabetes mellitus with hyperglycemia, without long-term current use of insulin ([place]) 01/13/2017   Essential hypertension 12/17/2016   Heart murmur, systolic 02/54/2706   Smoker 08/26/2016   Idiopathic scoliosis 01/28/2016   Cervical spondylosis with myelopathy 07/08/2013    Conditions to be addressed/monitored: monitor client management of stress issues and of depression issues  Care Plan : LCSW Care Plan  Updates made by Katha Cabal, LCSW since 10/15/2021 12:00 AM     Problem: Emotional Distress      Goal: Emotional Health Supported;manage stress issues; manage depression issues   Start Date: 07/10/2021  Expected End Date: 01/06/2022  This Visit's Progress: On track  Recent Progress: On track  Priority: Medium  Note:   Current Barriers:  Chronic Mental Health needs related to depression and stress issues Mobility issues Pain issues Challenges in managing Diabetes Suicidal Ideation/Homicidal Ideation: No  Clinical Social Work Goal(s):  patient will work with SW monthly by telephone or in person to reduce or manage symptoms related to depression issues LCSW will communicate with client in next 30 days to discuss anxiety and stress issues faced by client LCSW will talk with client in next 30 days to discuss financial challenges of client Client to call RNCM as needed in next 30 days to discuss nursing needs of client  Interventions: 1:1 collaboration with Loman Brooklyn, FNP regarding development and update of comprehensive plan of care as evidenced by provider attestation and co-signature Discussed client needs with Hulda Humphrey Discussed pain issues of client. Client spoke of back pain issues. She said she takes a prescribed pain medication daily. She also  goes one time per month to [facility] Discussed client completion of ADLs. She said she has a walk in shower and it has a seat. This is very helpful to her in bathing Discussed medication procurement for client Provided counseling support for client Discussed transport needs of client.  She said that her spouse , [name], transports her to and from medical appointments Discussed ambulation of client. She said she uses a cane or walker as needed to help her walk.    Encouraged client to call RNCM as needed in next 30 days for nursing support Discussed recent medical tests of client. She said she had two medical tests last Friday.  She said she was doing well since these tests Collaborated with RNCM today about client recent medical tests  Patient Self Care Activities:  Self administers medications as prescribed Attends all scheduled provider appointments Performs ADL's independently Performs IADL's independently  Patient Coping Strengths:  Family Friends  Patient Self Care Deficits:  Mobility issues Pain issues Academic librarian  Patient Goals:  - spend time or talk with others at least 2 to 3 times per week - practice relaxation or meditation daily - keep a calendar with appointment dates  Follow Up Plan:  LCSW to call client or Rozell Kettlewell, spouse of client, on 11/25/21 at 2:00 PM      Legrand Como  S.Copper Basnett MSW, LCSW Licensed Clinical Social Worker [facility] Care Management [phone]

## 2021-10-16 ENCOUNTER — Encounter (HOSPITAL_COMMUNITY): Payer: Self-pay | Admitting: Internal Medicine

## 2021-10-16 NOTE — Telephone Encounter (Signed)
Great, thank you!

## 2021-10-22 ENCOUNTER — Other Ambulatory Visit: Payer: Self-pay | Admitting: Family Medicine

## 2021-10-22 DIAGNOSIS — E559 Vitamin D deficiency, unspecified: Secondary | ICD-10-CM

## 2021-10-27 NOTE — Telephone Encounter (Signed)
Followed up with patient and she has not received these items yet but states they are on the way.

## 2021-10-30 DIAGNOSIS — K769 Liver disease, unspecified: Secondary | ICD-10-CM | POA: Diagnosis not present

## 2021-10-30 DIAGNOSIS — B181 Chronic viral hepatitis B without delta-agent: Secondary | ICD-10-CM | POA: Diagnosis not present

## 2021-10-30 DIAGNOSIS — K7460 Unspecified cirrhosis of liver: Secondary | ICD-10-CM | POA: Diagnosis not present

## 2021-10-30 DIAGNOSIS — I8510 Secondary esophageal varices without bleeding: Secondary | ICD-10-CM | POA: Diagnosis not present

## 2021-10-30 DIAGNOSIS — Z6826 Body mass index (BMI) 26.0-26.9, adult: Secondary | ICD-10-CM | POA: Diagnosis not present

## 2021-10-30 DIAGNOSIS — F1120 Opioid dependence, uncomplicated: Secondary | ICD-10-CM | POA: Diagnosis not present

## 2021-10-30 DIAGNOSIS — E1165 Type 2 diabetes mellitus with hyperglycemia: Secondary | ICD-10-CM | POA: Diagnosis not present

## 2021-10-30 DIAGNOSIS — R030 Elevated blood-pressure reading, without diagnosis of hypertension: Secondary | ICD-10-CM | POA: Diagnosis not present

## 2021-10-30 DIAGNOSIS — E119 Type 2 diabetes mellitus without complications: Secondary | ICD-10-CM | POA: Diagnosis not present

## 2021-10-30 DIAGNOSIS — Z79899 Other long term (current) drug therapy: Secondary | ICD-10-CM | POA: Diagnosis not present

## 2021-11-03 DIAGNOSIS — Z79899 Other long term (current) drug therapy: Secondary | ICD-10-CM | POA: Diagnosis not present

## 2021-11-10 DIAGNOSIS — I10 Essential (primary) hypertension: Secondary | ICD-10-CM

## 2021-11-10 DIAGNOSIS — E1165 Type 2 diabetes mellitus with hyperglycemia: Secondary | ICD-10-CM

## 2021-11-10 DIAGNOSIS — E782 Mixed hyperlipidemia: Secondary | ICD-10-CM

## 2021-11-10 DIAGNOSIS — F331 Major depressive disorder, recurrent, moderate: Secondary | ICD-10-CM

## 2021-11-10 DIAGNOSIS — D649 Anemia, unspecified: Secondary | ICD-10-CM

## 2021-11-13 ENCOUNTER — Ambulatory Visit: Payer: Medicare HMO | Admitting: *Deleted

## 2021-11-13 DIAGNOSIS — Z596 Low income: Secondary | ICD-10-CM

## 2021-11-13 DIAGNOSIS — E1165 Type 2 diabetes mellitus with hyperglycemia: Secondary | ICD-10-CM

## 2021-11-13 NOTE — Patient Instructions (Addendum)
Grace Stokes  At some point during the past 4 years, I have worked with you through the [place] Management Program at [place].  Due to program changes I am removing myself from your care team.   If you are currently active with another CCM Team Member, you will remain active with them unless they reach out to you with additional information.   I have scheduled a follow-up telephone appointment with Jackelyn Poling, RN Care Coordinator on 12/04/21 at 11:30 am.   Thank you for allowing me to participate in your your healthcare journey.  Chong Sicilian, BSN, RN-BC Proofreader Dial: [phone]

## 2021-11-13 NOTE — Chronic Care Management (AMB) (Signed)
  Chronic Care Management   Note  11/13/2021 Name: Grace Stokes MRN: [id number] DOB: [birth year]   Due to changes in the Chronic Care Management program, I am removing myself as the [place] from the Care Team and closing [place]. Patient was scheduled to be followed by the RN Care Coordination nurse for [facility]   Patient has an open Care Plan with another CCM team member, Theadore Nan, [place] & Lottie Dawson, PharmD.  I will forward this case closure encounter to the other team member(s). Patient does not have a current CCM referral placed since 08/11/21. CCM enrollment status changed to "not enrolled".   Patient's PCP can place a new referral if the they needs Care Management or Care Coordination services in the future.  Chong Sicilian, BSN, RN-BC Proofreader Dial: [phone]

## 2021-11-17 ENCOUNTER — Telehealth: Payer: Self-pay | Admitting: Family Medicine

## 2021-11-17 NOTE — Telephone Encounter (Signed)
Pt called requesting to speak with Julie. 

## 2021-11-18 ENCOUNTER — Other Ambulatory Visit (HOSPITAL_COMMUNITY): Payer: Self-pay | Admitting: Nurse Practitioner

## 2021-11-18 DIAGNOSIS — K7469 Other cirrhosis of liver: Secondary | ICD-10-CM | POA: Diagnosis not present

## 2021-11-18 DIAGNOSIS — B181 Chronic viral hepatitis B without delta-agent: Secondary | ICD-10-CM

## 2021-11-18 DIAGNOSIS — I8510 Secondary esophageal varices without bleeding: Secondary | ICD-10-CM | POA: Diagnosis not present

## 2021-11-18 DIAGNOSIS — K7460 Unspecified cirrhosis of liver: Secondary | ICD-10-CM

## 2021-11-18 NOTE — Telephone Encounter (Signed)
Call returned  Left VM with patient requesting call back

## 2021-11-19 NOTE — Progress Notes (Signed)
Referral placed.

## 2021-11-19 NOTE — Addendum Note (Signed)
Addended by: Loman Brooklyn on: 11/19/2021 04:49 PM   Modules accepted: Orders

## 2021-11-25 ENCOUNTER — Ambulatory Visit: Payer: Medicare HMO | Admitting: Licensed Clinical Social Worker

## 2021-11-25 DIAGNOSIS — I10 Essential (primary) hypertension: Secondary | ICD-10-CM

## 2021-11-25 DIAGNOSIS — E782 Mixed hyperlipidemia: Secondary | ICD-10-CM

## 2021-11-25 DIAGNOSIS — G4701 Insomnia due to medical condition: Secondary | ICD-10-CM

## 2021-11-25 DIAGNOSIS — E1165 Type 2 diabetes mellitus with hyperglycemia: Secondary | ICD-10-CM

## 2021-11-25 NOTE — Chronic Care Management (AMB) (Signed)
Chronic Care Management    Clinical Social Work Note  11/25/2021 Name: Grace Stokes MRN: [id number] DOB: 08/21/1955  Grace Stokes is a 66 y.o. year old female who is a primary care patient of Grace Brooklyn, FNP. The CCM team was consulted to assist the patient with chronic disease management and/or care coordination needs related to: [employer] .   Engaged with patient by telephone for follow up visit in response to provider referral for social work chronic care management and care coordination services.   Consent to Services:  The patient was given information about Chronic Care Management services, agreed to services, and gave verbal consent prior to initiation of services.  Please see initial visit note for detailed documentation.   Patient agreed to services and consent obtained.   Assessment: Review of patient past medical history, allergies, medications, and health status, including review of relevant consultants reports was performed today as part of a comprehensive evaluation and provision of chronic care management and care coordination services.     SDOH (Social Determinants of Health) assessments and interventions performed:  SDOH Interventions    Flowsheet Row Most Recent Value  SDOH Interventions   Physical Activity Interventions Other (Comments)  [client has walking challenges. Client fell recently and is resting in bed.]  Stress Interventions Provide Counseling  [client has stress related tp managing medical needs]        Advanced Directives Status: See Vynca application for related entries.  CCM Care Plan  Allergies  Allergen Reactions   Gabapentin Other (See Comments)    Uncontrolled shakes / cold intolerance     Outpatient Encounter Medications as of 11/25/2021  Medication Sig Note   amoxicillin (AMOXIL) 500 MG capsule Take 1 capsule (500 mg total) by mouth 2 (two) times daily.    Buprenorphine HCl-Naloxone HCl (SUBOXONE) 8-2 MG  FILM Place 1 Film under the tongue in the morning and at bedtime.    Dulaglutide (TRULICITY) 1.5 AC/1.6SA SOPN Inject 1.5 mg into the skin every Sunday. 11/18/2021: [place] patient assistance program     entecavir (BARACLUDE) 1 MG tablet Take 1 mg by mouth at bedtime.    Evolocumab (REPATHA SURECLICK) 630 MG/ML SOAJ Inject 140 mg into the skin every 14 (fourteen) days.    fluticasone (FLONASE) 50 MCG/ACT nasal spray Place 1 spray into both nostrils 2 (two) times daily as needed for allergies or rhinitis.    lisinopril (ZESTRIL) 20 MG tablet Take 1 tablet (20 mg total) by mouth daily. (Patient taking differently: Take 20 mg by mouth at bedtime.)    metoprolol succinate (TOPROL-XL) 100 MG 24 hr tablet Take 1 tablet (100 mg total) by mouth daily. Take with or immediately following a meal.    mirtazapine (REMERON) 15 MG tablet Take 1 tablet (15 mg total) by mouth at bedtime.    Vitamin D, Ergocalciferol, (DRISDOL) 1.25 MG (50000 UNIT) CAPS capsule Take 1 capsule (50,000 Units total) by mouth every Sunday.    No facility-administered encounter medications on file as of 11/25/2021.    Patient Active Problem List   Diagnosis Date Noted   Vitamin D deficiency 08/31/2021   Vitamin A deficiency 08/31/2021   Constipation 08/11/2021   Dysphagia 08/11/2021   Malnutrition of moderate degree 05/17/2021   Chronic pain 05/15/2021   CAD (coronary artery disease) 05/15/2021   Discitis of thoracic region 05/15/2021   DNR (do not resuscitate) 05/15/2021   Pressure injury of left buttock, stage 2 ([name]) 05/11/2021   Aortic  atherosclerosis ([name]) 02/18/2021   Moderate episode of recurrent major depressive disorder ([name]) 02/18/2021   Tachycardia 02/18/2021   Statins contraindicated 01/16/2021   Cirrhosis of liver without ascites ([name]) 10/23/2020   History of drug abuse ([name]) 10/23/2020   Mixed hyperlipidemia 04/18/2019   Other pancytopenia ([name]) 03/02/2019   Thrombocytopenia ([name])    Insomnia due to  medical condition 09/22/2018   Chronic active viral hepatitis B ([name]) 01/26/2017   Uncontrolled type 2 diabetes mellitus with hyperglycemia, without long-term current use of insulin ([name]) 01/13/2017   Essential hypertension 12/17/2016   Heart murmur, systolic 16/01/9603   Smoker 08/26/2016   Idiopathic scoliosis 01/28/2016   Cervical spondylosis with myelopathy 07/08/2013    Conditions to be addressed/monitored: monitor client management of stress issues  Care Plan : [name]  Updates made by Katha Cabal, LCSW since 11/25/2021 12:00 AM     Problem: Emotional Distress      Goal: Emotional Health Supported;manage stress issues; manage depression issues   Start Date: 07/10/2021  Expected End Date: 01/06/2022  This Visit's Progress: On track  Recent Progress: On track  Priority: Medium  Note:   Current Barriers:  Chronic Mental Health needs related to depression and stress issues Recent fall Pain issues Challenges in managing Diabetes Suicidal Ideation/Homicidal Ideation: No  Clinical Social Work Goal(s):  patient will work with SW monthly by telephone or in person to reduce or manage symptoms related to depression issues LCSW will communicate with client in next 30 days to discuss anxiety and stress issues faced by client LCSW will talk with client in next 30 days to discuss financial challenges of client Client to call RNCM as needed in next 30 days to discuss nursing needs of client  Interventions: 1:1 collaboration with Grace Brooklyn, FNP regarding development and update of comprehensive plan of care as evidenced by provider attestation and co-signature Discussed client needs with Grace Stokes Discussed pain issues of client. Client spoke of back pain issues. She said she takes a prescribed pain medication daily. She also goes one time per month to [name] Discussed recent fall of client. She said she fell  on August 9 and had to have EMS help her  get back up and help her to bed. She has been resting in bed the last few days. She has a 3 in 1 bedside commode nearby to use as needed. LCSW encouraged Grace Stokes to call [facility] Triage Nurse to discuss her current medical and nursing needs with Triage Nurse at [facility]. Orel said she would call Triage Nurse at [facility] said she also had been using medication she puts "under her tongue"  (client words.) She talked of some chest pain. Provided counseling support for client  Patient Self Care Activities:  Self administers medications as prescribed Attends all scheduled provider appointments Performs ADL's independently Performs IADL's independently  Patient Coping Strengths:  Family Friends  Patient Self Care Deficits:  Mobility issues Pain issues Academic librarian  Patient Goals:  - spend time or talk with others at least 2 to 3 times per week - practice relaxation or meditation daily - keep a calendar with appointment dates  Follow Up Plan:  LCSW to call client or Alonah Lineback, spouse of client, on 12/08/21 at 1:30 PM      Norva Riffle.Imogene Gravelle MSW, [place] Holiday representative [facility] Care Management [phone]

## 2021-11-25 NOTE — Patient Instructions (Addendum)
Visit Information  Patient Goals:  Manage emotions; manage depression issues. Manage stress issues  Timeframe:  Short-Term Goal Priority:  Medium  Progress: On Track Start Date:             07/10/21                Expected End Date:           01/06/22                Follow Up Date 12/08/21 at 1:30 PM     Manage emotions; manage depression issues; manage stress issues    Why is this important?   When you are stressed, down or upset, your body reacts too.  For example, your blood pressure may get higher; you may have a headache or stomachache.  When your emotions get the best of you, your body's ability to fight off cold and flu gets weak.  These steps will help you manage your emotions.     Patient Self Care Activities:  Self administers medications as prescribed Attends all scheduled provider appointments Performs ADL's independently Performs IADL's independently  Patient Coping Strengths:  Family Friends  Patient Self Care Deficits:  Mobility issues Pain issues Food procurement challenges Transport challenges  Patient Goals:  - spend time or talk with others at least 2 to 3 times per week - practice relaxation or meditation daily - keep a calendar with appointment dates  Follow Up Plan:  LCSW to call client or spouse of client on 12/08/21 at 1:30 PM   Norva Riffle.Salar Molden MSW, [place] Holiday representative [facility] Care Management [phone]

## 2021-11-25 NOTE — Telephone Encounter (Signed)
Pt needs patient assistance on these medications.  Vitamin D, Ergocalciferol, (DRISDOL) 1.25 MG (50000 UNIT) CAPS capsule mirtazapine (REMERON) 15 MG tablet Evolocumab (REPATHA SURECLICK) 021 MG/ML SOAJ lisinopril (ZESTRIL) 20 MG tablet Dulaglutide (TRULICITY) 1.5 JD/5.5MC SOPN

## 2021-11-26 NOTE — Telephone Encounter (Signed)
I just put in a referral for this patient on 11/19/2021. Do I need to do it again?

## 2021-11-26 NOTE — Telephone Encounter (Signed)
Patient needs CCM referral and appt (new referral [id number]) Will route to PCP and Amber for scheduling  I will be able to assist with GLP1 (trulicity-->ozempic) I will not be able to get repatha until next year

## 2021-11-28 ENCOUNTER — Telehealth: Payer: Self-pay

## 2021-11-28 ENCOUNTER — Telehealth: Payer: Self-pay | Admitting: Family Medicine

## 2021-11-28 NOTE — Chronic Care Management (AMB) (Signed)
  Chronic Care Management   Note  11/28/2021 Name: Zykiria Bruening MRN: [id number] DOB: 07/15/1955  Tinie Mcgloin is a 66 y.o. year old female who is a primary care patient of Loman Brooklyn, FNP. I reached out to [name] by phone today in response to a referral sent by Ms. Darcel Smalling PCP.  Ms. Weingart was given information about Chronic Care Management services today including:  CCM service includes personalized support from designated clinical staff supervised by her physician, including individualized plan of care and coordination with other care providers 24/7 contact phone numbers for assistance for urgent and routine care needs. Service will only be billed when office clinical staff spend 20 minutes or more in a month to coordinate care. Only one practitioner may furnish and bill the service in a calendar month. The patient may stop CCM services at any time (effective at the end of the month) by phone call to the office staff. The patient is responsible for co-pay (up to 20% after annual deductible is met) if co-pay is required by the individual health plan.   Patient agreed to services and verbal consent obtained.   Follow up plan: Telephone appointment with care management team member scheduled for:01/09/2022  Noreene Larsson, [place], [place] [address] Direct Dial: [phone] [email or web address]

## 2021-11-29 DIAGNOSIS — G959 Disease of spinal cord, unspecified: Secondary | ICD-10-CM | POA: Diagnosis not present

## 2021-11-29 DIAGNOSIS — F1120 Opioid dependence, uncomplicated: Secondary | ICD-10-CM | POA: Diagnosis not present

## 2021-11-29 DIAGNOSIS — Z79899 Other long term (current) drug therapy: Secondary | ICD-10-CM | POA: Diagnosis not present

## 2021-11-29 DIAGNOSIS — R030 Elevated blood-pressure reading, without diagnosis of hypertension: Secondary | ICD-10-CM | POA: Diagnosis not present

## 2021-11-29 DIAGNOSIS — Z6826 Body mass index (BMI) 26.0-26.9, adult: Secondary | ICD-10-CM | POA: Diagnosis not present

## 2021-12-03 DIAGNOSIS — Z79899 Other long term (current) drug therapy: Secondary | ICD-10-CM | POA: Diagnosis not present

## 2021-12-04 ENCOUNTER — Ambulatory Visit: Payer: Self-pay | Admitting: *Deleted

## 2021-12-04 ENCOUNTER — Encounter: Payer: Self-pay | Admitting: *Deleted

## 2021-12-04 DIAGNOSIS — E1165 Type 2 diabetes mellitus with hyperglycemia: Secondary | ICD-10-CM

## 2021-12-04 DIAGNOSIS — G8929 Other chronic pain: Secondary | ICD-10-CM

## 2021-12-04 DIAGNOSIS — M4712 Other spondylosis with myelopathy, cervical region: Secondary | ICD-10-CM

## 2021-12-04 DIAGNOSIS — B181 Chronic viral hepatitis B without delta-agent: Secondary | ICD-10-CM

## 2021-12-04 DIAGNOSIS — K7460 Unspecified cirrhosis of liver: Secondary | ICD-10-CM

## 2021-12-04 NOTE — Patient Outreach (Signed)
  Care Coordination   Initial Visit Note   12/04/2021 Name: Grace Stokes MRN: [id number] DOB: 07/07/1955  Grace Stokes is a 66 y.o. year old female who sees Loman Brooklyn, FNP for primary care. I spoke with  Grace Stokes by phone today  What matters to the patients health and wellness today?  Medicine cost concerns, want follow up if she is eligible for home therapies Bed bound since 11/18/21 Change 12/18/21 pcp to telephone call & inquire who is new primary MD (she emphasizes wanting a pcp who listens) Continues with [facility] SW services (depression, stress)  Permission given to outreach to pcp and care guide referral (financial strain, community resources)    Goals Addressed               This Visit's Progress     Patient Stated     manage fall prevention home care [facility]) (pt-stated)   Not on track     12/04/21  Care Coordination Interventions: Assessed for falls since last encounter Screening for signs and symptoms of depression related to chronic disease state  Assessed social determinant of health barriers Confirmed patient reports falls on 11/18/21 & 11/19/21 and is basically now bed bound with only assistance of her spouse, Grace Stokes Reading 12/04/21 sent a message to her pcp to review voiced concern about her medicines, home health after MRI is now completed, her inquiry about pending new pcp and possibly changing her 12/18/21 pcp visit to telephonic      manage medicine costs [facility]) (pt-stated)   Not on track     Care Coordination Interventions: Evaluation of current treatment plan related to medications and patient's adherence to plan as established by provider Reviewed medications with patient and discussed ones she is having main cost concern with: Repatha Collaborated with pcp office, pharmacy regarding medication cost concerns 12/04/21 sent a message to her pcp to review voiced concern about her medicines, home health after MRI is now completed, her inquiry about  pending new pcp and possibly changing her 12/18/21 pcp visit to telephonic. Reviewed 8/7 & 11/26/21 [facility] pharmacy notes indicating assistance available for Trulicity/Ozempic but no available Repatha assistance until 2024.  Care guide referral for financial strain and community resources)         SDOH assessments and interventions completed:  Yes  SDOH Interventions Today    Flowsheet Row Most Recent Value  SDOH Interventions   Financial Strain Interventions [id number] Referral  Physical Activity Interventions Intervention Not Indicated  Stress Interventions Other (Comment)  [related to medications and bed bound]  Transportation Interventions Intervention Not Indicated  Depression Interventions/Treatment  Counseling, Currently on Treatment        Care Coordination Interventions Activated:  Yes  Care Coordination Interventions:  Yes, provided   Follow up plan: Follow up call scheduled for 12/09/21 1030    Encounter Outcome:  Pt. Visit Completed   Grace Stokes L. Lavina Hamman, RN, BSN, [place] Coordinator Office number [phone]

## 2021-12-04 NOTE — Patient Instructions (Signed)
Visit Information  Thank you for taking time to visit with me today. Please don't hesitate to contact me if I can be of assistance to you.   Following are the goals we discussed today:   Goals Addressed               This Visit's Progress     Patient Stated     manage fall prevention home care [facility]) (pt-stated)   Not on track     12/04/21  Care Coordination Interventions: Assessed for falls since last encounter Screening for signs and symptoms of depression related to chronic disease state  Assessed social determinant of health barriers Confirmed patient reports falls on 11/18/21 & 11/19/21 and is basically now bed bound with only assistance of her spouse, Christia Reading 12/04/21 sent a message to her pcp to review voiced concern about her medicines, home health after MRI is now completed, her inquiry about pending new pcp and possibly changing her 12/18/21 pcp visit to telephonic      manage medicine costs [facility]) (pt-stated)   Not on track     Care Coordination Interventions: Evaluation of current treatment plan related to medications and patient's adherence to plan as established by provider Reviewed medications with patient and discussed ones she is having main cost concern with: Repatha Collaborated with pcp office, pharmacy regarding medication cost concerns 12/04/21 sent a message to her pcp to review voiced concern about her medicines, home health after MRI is now completed, her inquiry about pending new pcp and possibly changing her 12/18/21 pcp visit to telephonic. Reviewed 8/7 & 11/26/21 [facility] pharmacy notes indicating assistance available for Trulicity/Ozempic but no available Repatha assistance until 2024.  Care guide referral for financial strain and community resources)         Our next appointment is by telephone on 12/09/21 at 1030  Please call the care guide team at [phone] if you need to cancel or reschedule your appointment.   If you are experiencing a Mental Health or  [place] or need someone to talk to, please call the Suicide and Crisis Lifeline: 988 call the [place] National Suicide Prevention Lifeline: 630-833-4508 or TTY: 2497325044 TTY (509) 210-3513) to talk to a trained counselor call 1-800-273-TALK (toll free, 24 hour hotline) call the [facility]: ([phone]) [phone] call 911   Patient verbalizes understanding of instructions and care plan provided today and agrees to view in [place]. Active MyChart status and patient understanding of how to access instructions and care plan via MyChart confirmed with patient.     The patient has been provided with contact information for the care management team and has been advised to call with any health related questions or concerns.   [place] Lavina Hamman, RN, BSN, [place] Coordinator Office number [phone]

## 2021-12-05 ENCOUNTER — Telehealth: Payer: Self-pay | Admitting: Family Medicine

## 2021-12-05 ENCOUNTER — Telehealth: Payer: Self-pay

## 2021-12-05 NOTE — Telephone Encounter (Signed)
I received the following concerns CCM nurse working with this patient. Please let her know my responses below.   [name] wanted to know who the new pcp will be as she "wants someone that will listen to me". She is able to choose between our providers accepting patients who will continue her care. Please let her know who this is. I will still see her next month for her chronic follow-up.  She states she remains bed bound since her falls on 11/18/21 & 11/19/21 and wanted to know if her 12/18/21 pcp visit could be telephonic. I am concerned that she is bed bound and feel like she needs an in-person appointment, even prior to her scheduled appointment to make sure nothing serious is going on. Her appointment with me needs to be in-person for lab work.  She also inquired if she is eligible for home health rehab after the MRI was completed. I confirmed she did receive the MRI results and reminded her of the letter mailed on 09/30/21 with the results. I had suggested a referral to a neurosurgeon and never received her response to place the order. I am okay with home health PT, but that also needs an in-person appointment.  She is also reporting not having some medicines to take related to cost concerns like her Repatha, Lisinopril. I will follow up with the pharmacy staff related to medication assistance if available.

## 2021-12-05 NOTE — Telephone Encounter (Signed)
   Telephone encounter was:  Unsuccessful.  12/05/2021 Name: Grace Stokes MRN: [id number] DOB: 1955/12/05  Unsuccessful outbound call made today to assist with:  Financial Difficulties related to FINANCIAL STRAIN  Outreach Attempt:  1st Attempt  A HIPAA compliant voice message was left requesting a return call.  Instructed patient to call back at   earliest convenience. [place], Care Management  [phone] [address], [place], [place] [address] Phone: [phone] Email: [email or web address]

## 2021-12-08 ENCOUNTER — Ambulatory Visit (INDEPENDENT_AMBULATORY_CARE_PROVIDER_SITE_OTHER): Payer: Medicare HMO | Admitting: Licensed Clinical Social Worker

## 2021-12-08 DIAGNOSIS — G4701 Insomnia due to medical condition: Secondary | ICD-10-CM

## 2021-12-08 DIAGNOSIS — E782 Mixed hyperlipidemia: Secondary | ICD-10-CM

## 2021-12-08 NOTE — Chronic Care Management (AMB) (Signed)
Chronic Care Management    Clinical Social Work Note  12/08/2021 Name: Grace Stokes MRN: [id number] DOB: 10-Mar-1956  Grace Stokes is a 66 y.o. year old female who is a primary care patient of Loman Brooklyn, FNP. The CCM team was consulted to assist the patient with chronic disease management and/or care coordination needs related to: [employer] .   Engaged with patient by telephone for follow up visit in response to provider referral for social work chronic care management and care coordination services.   Consent to Services:  The patient was given information about Chronic Care Management services, agreed to services, and gave verbal consent prior to initiation of services.  Please see initial visit note for detailed documentation.   Patient agreed to services and consent obtained.   Assessment: Review of patient past medical history, allergies, medications, and health status, including review of relevant consultants reports was performed today as part of a comprehensive evaluation and provision of chronic care management and care coordination services.     SDOH (Social Determinants of Health) assessments and interventions performed:  SDOH Interventions    Flowsheet Row Most Recent Value  SDOH Interventions   Physical Activity Interventions Other (Comments)  [client is resting in bed daily, recovering from a fall]  Stress Interventions Provide Counseling        Advanced Directives Status: See Vyn[employer] application for related entries.  CCM Care Plan  Allergies  Allergen Reactions   Gabapentin Other (See Comments)    Uncontrolled shakes / cold intolerance     Outpatient Encounter Medications as of 12/08/2021  Medication Sig Note   amoxicillin (AMOXIL) 500 MG capsule Take 1 capsule (500 mg total) by mouth 2 (two) times daily.    Buprenorphine HCl-Naloxone HCl (SUBOXONE) 8-2 MG FILM Place 1 Film under the tongue in the morning and at bedtime.     carvedilol (COREG) 12.5 MG tablet Take by mouth.    Dulaglutide (TRULICITY) 1.5 FX/8.3AN SOPN Inject 1.5 mg into the skin every Sunday. 11/18/2021: [place] patient assistance program     entecavir (BARACLUDE) 1 MG tablet Take 1 mg by mouth at bedtime.    Evolocumab (REPATHA SURECLICK) 191 MG/ML SOAJ Inject 140 mg into the skin every 14 (fourteen) days.    fluticasone (FLONASE) 50 MCG/ACT nasal spray Place 1 spray into both nostrils 2 (two) times daily as needed for allergies or rhinitis.    lisinopril (ZESTRIL) 20 MG tablet Take 1 tablet (20 mg total) by mouth daily. (Patient taking differently: Take 20 mg by mouth at bedtime.)    metoprolol succinate (TOPROL-XL) 100 MG 24 hr tablet Take 1 tablet (100 mg total) by mouth daily. Take with or immediately following a meal. (Patient not taking: Reported on 12/04/2021)    mirtazapine (REMERON) 15 MG tablet Take 1 tablet (15 mg total) by mouth at bedtime.    Vitamin D, Ergocalciferol, (DRISDOL) 1.25 MG (50000 UNIT) CAPS capsule Take 1 capsule (50,000 Units total) by mouth every Sunday.    No facility-administered encounter medications on file as of 12/08/2021.    Patient Active Problem List   Diagnosis Date Noted   Vitamin D deficiency 08/31/2021   Vitamin A deficiency 08/31/2021   Constipation 08/11/2021   Dysphagia 08/11/2021   Malnutrition of moderate degree 05/17/2021   Chronic pain 05/15/2021   CAD (coronary artery disease) 05/15/2021   Discitis of thoracic region 05/15/2021   DNR (do not resuscitate) 05/15/2021   Pressure injury of left buttock, stage  2 ([place]) 05/11/2021   Aortic atherosclerosis ([place]) 02/18/2021   Moderate episode of recurrent major depressive disorder ([place]) 02/18/2021   Tachycardia 02/18/2021   Statins contraindicated 01/16/2021   Cirrhosis of liver without ascites ([place]) 10/23/2020   History of drug abuse ([place]) 10/23/2020   Mixed hyperlipidemia 04/18/2019   Other pancytopenia ([place]) 03/02/2019   Thrombocytopenia  ([place])    Insomnia due to medical condition 09/22/2018   Chronic active viral hepatitis B ([place]) 01/26/2017   Uncontrolled type 2 diabetes mellitus with hyperglycemia, without long-term current use of insulin ([place]) 01/13/2017   Essential hypertension 12/17/2016   Heart murmur, systolic 04/15/7251   Smoker 08/26/2016   Idiopathic scoliosis 01/28/2016   Cervical spondylosis with myelopathy 07/08/2013    Conditions to be addressed/monitored: monitor client completion of ADLs, with assistance as needed  Care Plan : [place]  Updates made by Katha Cabal, LCSW since 12/08/2021 12:00 AM     Problem: Emotional Distress      Goal: Emotional Health Supported;manage stress issues; manage depression issues   Start Date: 07/10/2021  Expected End Date: 01/06/2022  This Visit's Progress: On track  Recent Progress: On track  Priority: Medium  Note:   Current Barriers:  Chronic Mental Health needs related to depression and stress issues Recent fall Pain issues Challenges in managing Diabetes Suicidal Ideation/Homicidal Ideation: No  Clinical Social Work Goal(s):  patient will work with SW monthly by telephone or in person to reduce or manage symptoms related to depression issues LCSW will communicate with client in next 30 days to discuss anxiety and stress issues faced by client LCSW will talk with client in next 30 days to discuss financial challenges of client Client to call RNCM as needed in next 30 days to discuss nursing needs of client  Interventions: 1:1 collaboration with Loman Brooklyn, FNP regarding development and update of comprehensive plan of care as evidenced by provider attestation and co-signature Discussed client needs with Hulda Humphrey Discussed pain issues of client. Client spoke of back pain issues. She said she takes a prescribed pain medication daily. She also goes one time per [facility] to [facility] She said she went to [facility] on  11/29/21 Discussed recent fall of client. She said she fell  on August 9 and had to have EMS help her get back up and help her to bed. She has been resting in bed  since her fall.  She has a 3 in 1 bedside commode nearby to use as needed. Provided counseling support for client Spoke with client about sleeping challenges (she has difficulty sleeping). She said she has reduced appetite. Her spouse, [name], helps procure grocery items for client and helps Milanie with her meal needs. Discussed Care Coordination program support with client. Informed her of program support with RN Joellyn Quails and [place]. Client said she has appointment with Hendricks Limes FNP next week and that client will talk more with Hendricks Limes FNP about Care Coordination program support. Client and LCSW agreed for LCSW to discharge client from [place] today. LCSW thanked [place] for participation in [place] support services. Akansha was very appreciative of SW support with CCM services LCSW is discharging client today from [place]. Client agreed to plan  Patient Self Care Activities:  Self administers medications as prescribed Attends all scheduled provider appointments Performs ADL's independently Performs IADL's independently  Patient Coping Strengths:  Family Friends  Patient Self Care Deficits:  Mobility issues Pain  issues Food Development worker, community  Patient Goals:  - spend time or talk with others at least 2 to 3 times per week - practice relaxation or meditation daily - keep a calendar with appointment dates  Follow Up Plan:  LCSW  is discharging client today from [place]. Client agreed to plan     Norva Riffle.Deerica Waszak MSW, [place] Holiday representative [facility] Care Management [phone]

## 2021-12-08 NOTE — Patient Instructions (Addendum)
Visit Information  Patient goals:  Manage emotions; manage depression issues; manage stress issues  Timeframe:  Short-Term Goal Priority:  Medium  Progress: On Track Start Date:             07/10/21                Expected End Date:           01/06/22                Follow Up Date  LCSW is discharging  client today from [place]. Client agreed to plan. Client to talk with Hendricks Limes FNP at appointment with Hendricks Limes next week about receiving support with Care Coordination program through [facility]   Manage emotions; manage depression issues; manage stress issues    Why is this important?   When you are stressed, down or upset, your body reacts too.  For example, your blood pressure may get higher; you may have a headache or stomachache.  When your emotions get the best of you, your body's ability to fight off cold and flu gets weak.  These steps will help you manage your emotions.     Patient Self Care Activities:  Self administers medications as prescribed Attends all scheduled provider appointments Performs ADL's independently Performs IADL's independently  Patient Coping Strengths:  Family Friends  Patient Self Care Deficits:  Mobility issues Pain issues Academic librarian  Patient Goals:  - spend time or talk with others at least 2 to 3 times per week - practice relaxation or meditation daily - keep a calendar with appointment dates  Follow Up Plan:  LCSW  is discharging client today from [place]. Client agreed to plan. Client to speak with Hendricks Limes FNP at next week appointment of client with Karsten Fells to discuss further Care Coordination program support  through [facility].   Grace Stokes.Grace Stokes MSW, [place] Holiday representative [facility] Care Management [phone]

## 2021-12-08 NOTE — Telephone Encounter (Signed)
Patient aware and verbalizes understanding. 

## 2021-12-09 ENCOUNTER — Ambulatory Visit: Payer: Self-pay | Admitting: *Deleted

## 2021-12-09 ENCOUNTER — Telehealth: Payer: Self-pay

## 2021-12-09 NOTE — Patient Outreach (Signed)
  Care Coordination   12/09/2021 Name: Grace Stokes MRN: [id number] DOB: 1955/07/03   Care Coordination Outreach Attempts:  An unsuccessful telephone outreach was attempted today to offer the patient information about available care coordination services as a benefit of their health plan.   Follow Up Plan:  Additional outreach attempts will be made to offer the patient care coordination information and services.   Encounter Outcome:  No Answer  Care Coordination Interventions Activated:  No   Care Coordination Interventions:  No, not indicated    SIG Thanh Mottern L. Lavina Hamman, RN, BSN, [place] Coordinator Office number [phone]

## 2021-12-09 NOTE — Telephone Encounter (Signed)
   Telephone encounter was:  Unsuccessful.  12/09/2021 Name: Grace Stokes MRN: [id number] DOB: 1956/04/04  Unsuccessful outbound call made today to assist with:  Food Insecurity and Financial Difficulties related to financial strain  Outreach Attempt:  2nd Attempt  A HIPAA compliant voice message was left requesting a return call.  Instructed patient to call back at earliest convenience. [place], Care Management  [phone] [address], [place], [place] [address] Phone: [phone] Email: [email or web address]

## 2021-12-10 ENCOUNTER — Telehealth: Payer: Self-pay

## 2021-12-10 NOTE — Telephone Encounter (Signed)
   Telephone encounter was:  Unsuccessful.  12/10/2021 Name: Grace Stokes MRN: [id number] DOB: 1956/03/31  Unsuccessful outbound call made today to assist with:  Food Insecurity and Financial Difficulties related to financial  Outreach Attempt:  3rd Attempt.  Referral closed unable to contact patient.  A HIPAA compliant voice message was left requesting a return call.  Instructed patient to call back    [place], [place] Management  [phone] [address], [place], [place] [address] Phone: [phone] Email: [email or web address]

## 2021-12-11 DIAGNOSIS — I10 Essential (primary) hypertension: Secondary | ICD-10-CM

## 2021-12-11 DIAGNOSIS — E1165 Type 2 diabetes mellitus with hyperglycemia: Secondary | ICD-10-CM

## 2021-12-11 DIAGNOSIS — E782 Mixed hyperlipidemia: Secondary | ICD-10-CM

## 2021-12-18 ENCOUNTER — Ambulatory Visit (INDEPENDENT_AMBULATORY_CARE_PROVIDER_SITE_OTHER): Payer: Medicare HMO

## 2021-12-18 ENCOUNTER — Ambulatory Visit: Payer: Medicare HMO

## 2021-12-18 ENCOUNTER — Encounter: Payer: Self-pay | Admitting: Family Medicine

## 2021-12-18 ENCOUNTER — Ambulatory Visit (INDEPENDENT_AMBULATORY_CARE_PROVIDER_SITE_OTHER): Payer: Medicare HMO | Admitting: Family Medicine

## 2021-12-18 ENCOUNTER — Other Ambulatory Visit: Payer: Self-pay | Admitting: Family Medicine

## 2021-12-18 VITALS — BP 130/82 | HR 92 | Temp 98.1°F | Resp 20 | Ht <= 58 in | Wt 125.0 lb

## 2021-12-18 DIAGNOSIS — E440 Moderate protein-calorie malnutrition: Secondary | ICD-10-CM

## 2021-12-18 DIAGNOSIS — G8929 Other chronic pain: Secondary | ICD-10-CM

## 2021-12-18 DIAGNOSIS — D696 Thrombocytopenia, unspecified: Secondary | ICD-10-CM

## 2021-12-18 DIAGNOSIS — E1165 Type 2 diabetes mellitus with hyperglycemia: Secondary | ICD-10-CM

## 2021-12-18 DIAGNOSIS — M546 Pain in thoracic spine: Secondary | ICD-10-CM | POA: Diagnosis not present

## 2021-12-18 DIAGNOSIS — G4701 Insomnia due to medical condition: Secondary | ICD-10-CM | POA: Diagnosis not present

## 2021-12-18 DIAGNOSIS — I700 Atherosclerosis of aorta: Secondary | ICD-10-CM

## 2021-12-18 DIAGNOSIS — M5441 Lumbago with sciatica, right side: Secondary | ICD-10-CM

## 2021-12-18 DIAGNOSIS — I10 Essential (primary) hypertension: Secondary | ICD-10-CM | POA: Diagnosis not present

## 2021-12-18 DIAGNOSIS — E782 Mixed hyperlipidemia: Secondary | ICD-10-CM

## 2021-12-18 DIAGNOSIS — M4624 Osteomyelitis of vertebra, thoracic region: Secondary | ICD-10-CM

## 2021-12-18 DIAGNOSIS — D61818 Other pancytopenia: Secondary | ICD-10-CM

## 2021-12-18 DIAGNOSIS — M549 Dorsalgia, unspecified: Secondary | ICD-10-CM | POA: Diagnosis not present

## 2021-12-18 DIAGNOSIS — Z5309 Procedure and treatment not carried out because of other contraindication: Secondary | ICD-10-CM | POA: Diagnosis not present

## 2021-12-18 DIAGNOSIS — I2510 Atherosclerotic heart disease of native coronary artery without angina pectoris: Secondary | ICD-10-CM

## 2021-12-18 DIAGNOSIS — Z23 Encounter for immunization: Secondary | ICD-10-CM

## 2021-12-18 DIAGNOSIS — R296 Repeated falls: Secondary | ICD-10-CM

## 2021-12-18 DIAGNOSIS — F331 Major depressive disorder, recurrent, moderate: Secondary | ICD-10-CM

## 2021-12-18 LAB — BAYER DCA HB A1C WAIVED: HB A1C (BAYER DCA - WAIVED): 9.3 % — ABNORMAL HIGH (ref 4.8–5.6)

## 2021-12-18 MED ORDER — TRULICITY 4.5 MG/0.5ML ~~LOC~~ SOAJ: 4.5000 mg | SUBCUTANEOUS | 1 refills | Status: DC

## 2021-12-18 MED ORDER — DULOXETINE HCL 30 MG PO CPEP: 30.0000 mg | ORAL_CAPSULE | Freq: Every day | ORAL | 3 refills | Status: DC

## 2021-12-18 NOTE — Progress Notes (Unsigned)
Assessment & Plan:  1. Uncontrolled type 2 diabetes mellitus with hyperglycemia, without long-term current use of insulin (HCC) Lab Results  Component Value Date   HGBA1C 9.3 (H) 12/18/2021   HGBA1C 12.4 (H) 08/27/2021   HGBA1C 6.9 (H) 05/08/2021    - Diabetes is not at goal of A1c < 7, but is improving. - Medications:  increase Trulicity from 3 mg to 4.5 mg weekly - Patient is not currently taking a statin. Patient is taking an ACE-inhibitor/ARB.   Diabetes Health Maintenance Due  Topic Date Due   FOOT EXAM  01/16/2022   HEMOGLOBIN A1C  06/18/2022   OPHTHALMOLOGY EXAM  06/24/2022    Lab Results  Component Value Date   LABMICR 4.6 01/16/2021   LABMICR See below: 12/11/2020   - CBC with Differential/Platelet - CMP14+EGFR - Lipid panel - Bayer DCA Hb A1c Waived - Dulaglutide (TRULICITY) 4.5 PJ/0.9TO SOPN; Inject 4.5 mg as directed once a week.  Dispense: 6 mL; Refill: 1  2. Aortic atherosclerosis (HCC) Continue current regimen. - Lipid panel - aspirin EC 81 MG tablet; Take 81 mg by mouth daily. Swallow whole.  3. Coronary artery disease involving native coronary artery of native heart without angina pectoris Continue current regimen. - Lipid panel - aspirin EC 81 MG tablet; Take 81 mg by mouth daily. Swallow whole.  4. Mixed hyperlipidemia Hopefully we will be able to get patient back on Repatha in the near future as she is unable to afford it currently. - Lipid panel  5. Statins contraindicated Hopefully we will be able to get patient back on Repatha in the near future as she is unable to afford it currently.  6. Essential hypertension Well controlled on current regimen.  - CBC with Differential/Platelet - CMP14+EGFR - Lipid panel  7. Moderate episode of recurrent major depressive disorder (HCC) Improving.  Starting Cymbalta. - CMP14+EGFR - DULoxetine (CYMBALTA) 30 MG capsule; Take 1 capsule (30 mg total) by mouth daily.  Dispense: 30 capsule; Refill: 3  8.  Insomnia due to medical condition Well controlled on current regimen.  - CMP14+EGFR  9. Other pancytopenia ([place]) No further follow-up with oncology/hematology. - CBC with Differential/Platelet  10. Malnutrition of moderate degree I will discuss with our clinical pharmacist how we can get Glucerna for this patient.  Encouraged her to purchase over-the-counter in the time being. - CBC with Differential/Platelet - CMP14+EGFR  11. Chronic midline thoracic back pain Starting Cymbalta to try to help with pain control. - DULoxetine (CYMBALTA) 30 MG capsule; Take 1 capsule (30 mg total) by mouth daily.  Dispense: 30 capsule; Refill: 3 - Ambulatory referral to Neurosurgery - Ambulatory referral to [place]. Chronic midline low back pain with right-sided sciatica Starting Cymbalta to try to help with pain control. - DG Lumbar Spine 2-3 Views; Future - DULoxetine (CYMBALTA) 30 MG capsule; Take 1 capsule (30 mg total) by mouth daily.  Dispense: 30 capsule; Refill: 3 - Ambulatory referral to Neurosurgery - Ambulatory referral to [place]. Recurrent falls - Ambulatory referral to [place]  14. Osteomyelitis of thoracic spine ([place]) I have messaged patient's infectious disease provider, Dr. Linus Salmons to ensure she does not need further antibiotics since her MRI in June continue to show osteomyelitis.  15. Immunization due - Zoster Recombinant (Shingrix )    Follow up plan: Return in about 3 months (around 03/19/2022) for follow-up of chronic medication conditions with T. Lilia Pro.  Hendricks Limes, MSN, APRN, FNP-C [place]  Subjective:   Patient ID: Grace Stokes, female    DOB: Jun 17, 1955, 66 y.o.   MRN: [id number]  HPI: Grace Stokes is a 66 y.o. female presenting on 12/18/2021 for Medical Management of Chronic Issues (3 mo )  Patient is accompanied by her husband, who she is okay being present.  Hypertension/tachycardia: She does not  check her blood pressure at home. She denies foot/ankle swelling, chest pain, palpitations, and heart rhythm irregularities.  She was previously taking metoprolol; this was switched to carvedilol twice daily in August by her liver specialist.   Aortic Atherosclerosis/CAD: she is taking aspirin 81 mg daily. She cannot take statins due to active liver disease.  Repatha was previously approved through her insurance and she was able to use it until she got in the donut hole.  She is established with our clinical pharmacist for prescription assistance who will be able to help her more next year when grants open back up.  The 10-year ASCVD risk score (Arnett DK, et al., 2019) is: 36.2%   Values used to calculate the score:     Age: 66 years     Sex: Female     Is Non-Hispanic African American: No     Diabetic: Yes     Tobacco smoker: Yes     Systolic Blood Pressure: 209 mmHg     Is BP treated: Yes     HDL Cholesterol: 46 mg/dL     Total Cholesterol: 185 mg/dL  Diabetes: Current symptoms include: hyperglycemia. Known diabetic complications: nephropathy and cardiovascular disease. Medication compliance: yes, Trulicity 3 mg weekly. Current diet:  poor as she has very little appetite and states things don't taste good anymore . Current exercise: none. Home blood sugar records: patient does not check sugars. Is she  on ACE inhibitor or angiotensin II receptor blocker? Yes (Lisinopril). Is she on a statin? No, contraindicated due to active liver disease.   Pancytopenia: Patient was evaluated by hematology on 10/02/2021 at which time it felt this was related to her cirrhosis of the liver and hepatitis B.  No findings suggestive of acute leukemia or autoimmune disorder and a bone marrow biopsy was not recommended.  Depression: Taking Remeron to help with both depression and sleep. She reports she is sleeping really good now.     12/18/2021   10:56 AM 12/04/2021    8:48 PM 10/15/2021    2:32 PM  Depression  screen PHQ 2/9  Decreased Interest _0 Down, Depressed, Hopeless _1 PHQ - 2 Score _2 Altered sleeping _3 Tired, decreased energy _4 Change in appetite _5 Feeling bad or failure about yourself  _6 Trouble concentrating 1 0 0  Moving slowly or fidgety/restless 0 0 1  Suicidal thoughts 1 0 0  PHQ-9 Score _7 Difficult doing work/chores Very difficult Somewhat difficult Somewhat difficult      12/18/2021   10:57 AM 09/17/2021    2:23 PM 08/27/2021    2:11 PM 05/29/2021    1:58 PM  GAD 7 : Generalized Anxiety Score  Nervous, Anxious, on Edge _8 Control/stop worrying 0 _9 Worry too much - different things _10 Trouble relaxing _11 Restless 0 0 0 0  Easily annoyed or irritable 1 0 1 2  Afraid - awful might happen 1 0  0 0  Total GAD 7 Score _0 Anxiety Difficulty Very difficult Somewhat difficult Somewhat difficult Somewhat difficult    New Complaints: Patient reports she has been in the bed for a month now after a fall.  She states her right leg keeps giving out on her, which causes her to fall.  She landed on her buttocks when she fell and has had worsening low back pain since that time.  At this point, she is afraid to try to get up.  She admits she has not even had a bath for the past month.  Also since this fall she has had a very poor appetite.    ROS: Negative unless specifically indicated above in HPI.   Relevant past medical history reviewed and updated as indicated.   Allergies and medications reviewed and updated.   Current Outpatient Medications:    Buprenorphine HCl-Naloxone HCl (SUBOXONE) 8-2 MG FILM, Place 1 Film under the tongue in the morning and at bedtime., Disp: , Rfl:    carvedilol (COREG) 12.5 MG tablet, Take by mouth., Disp: , Rfl:    Dulaglutide (TRULICITY) 1.5 EX/5.2WU SOPN, Inject 1.5 mg into the skin every Sunday., Disp: , Rfl:    entecavir (BARACLUDE) 1 MG tablet, Take 1 mg by mouth at bedtime.,  Disp: , Rfl:    lisinopril (ZESTRIL) 20 MG tablet, Take 1 tablet (20 mg total) by mouth daily. (Patient taking differently: Take 20 mg by mouth at bedtime.), Disp: 90 tablet, Rfl: 1   mirtazapine (REMERON) 15 MG tablet, Take 1 tablet (15 mg total) by mouth at bedtime., Disp: 90 tablet, Rfl: 1   Vitamin D, Ergocalciferol, (DRISDOL) 1.25 MG (50000 UNIT) CAPS capsule, Take 1 capsule (50,000 Units total) by mouth every Sunday., Disp: 8 capsule, Rfl: 0   Evolocumab (REPATHA SURECLICK) 132 MG/ML SOAJ, Inject 140 mg into the skin every 14 (fourteen) days. (Patient not taking: Reported on 12/18/2021), Disp: 2 mL, Rfl: 5  Allergies  Allergen Reactions   Gabapentin Other (See Comments)    Uncontrolled shakes / cold intolerance     Objective:   BP (!) 153/84   Pulse 92   Temp 98.1 F (36.7 C)   Resp 20   Ht _1  (1.473 m)   Wt 125 lb (56.7 kg)   LMP 04/06/2011   SpO2 96%   BMI 26.13 kg/m    Physical Exam Vitals reviewed.  Constitutional:      General: She is not in acute distress.    Appearance: Normal appearance. She is normal weight. She is not ill-appearing, toxic-appearing or diaphoretic.  HENT:     Head: Normocephalic and atraumatic.     Right Ear: Tympanic membrane, ear canal and external ear normal.     Left Ear: Tympanic membrane, ear canal and external ear normal.     Nose: Nose normal. No congestion.     Mouth/Throat:     Mouth: Mucous membranes are moist.     Pharynx: Oropharynx is clear. No oropharyngeal exudate or posterior oropharyngeal erythema.  Cardiovascular:     Rate and Rhythm: Normal rate and regular rhythm.     Pulses: Normal pulses.     Heart sounds: Normal heart sounds.  Pulmonary:     Effort: Pulmonary effort is normal. No respiratory distress.     Breath sounds: Normal breath sounds. No wheezing or rhonchi.  Abdominal:     General: Bowel sounds are normal. There is no distension.     Palpations: Abdomen  is soft. There is no mass.  Musculoskeletal:      Cervical back: Normal range of motion.     Thoracic back: Deformity and tenderness present. Decreased range of motion. Scoliosis present.     Lumbar back: Deformity and tenderness present. Decreased range of motion. Scoliosis present.     Right hip: Normal.  Skin:    General: Skin is warm and dry.  Neurological:     General: No focal deficit present.     Mental Status: She is alert and oriented to person, place, and time.     Motor: No weakness.     Gait: Gait abnormal (riding in a wheelchair).  Psychiatric:        Mood and Affect: Mood normal.        Behavior: Behavior normal.        Thought Content: Thought content normal.        Judgment: Judgment normal.

## 2021-12-19 ENCOUNTER — Ambulatory Visit: Payer: Self-pay | Admitting: *Deleted

## 2021-12-19 LAB — CBC WITH DIFFERENTIAL/PLATELET
Basophils Absolute: 0 10*3/uL (ref 0.0–0.2)
Basos: 0 %
EOS (ABSOLUTE): 0.1 10*3/uL (ref 0.0–0.4)
Eos: 2 %
Hematocrit: 34.9 % (ref 34.0–46.6)
Hemoglobin: 12.6 g/dL (ref 11.1–15.9)
Immature Grans (Abs): 0 10*3/uL (ref 0.0–0.1)
Immature Granulocytes: 1 %
Lymphocytes Absolute: 0.7 10*3/uL (ref 0.7–3.1)
Lymphs: 31 %
MCH: 30.8 pg (ref 26.6–33.0)
MCHC: 36.1 g/dL — ABNORMAL HIGH (ref 31.5–35.7)
MCV: 85 fL (ref 79–97)
Monocytes Absolute: 0.2 10*3/uL (ref 0.1–0.9)
Monocytes: 9 %
Neutrophils Absolute: 1.4 10*3/uL (ref 1.4–7.0)
Neutrophils: 57 %
Platelets: 64 10*3/uL — CL (ref 150–450)
RBC: 4.09 x10E6/uL (ref 3.77–5.28)
RDW: 11.8 % (ref 11.7–15.4)
WBC: 2.4 10*3/uL — CL (ref 3.4–10.8)

## 2021-12-19 LAB — CMP14+EGFR
ALT: 26 IU/L (ref 0–32)
AST: 40 IU/L (ref 0–40)
Albumin/Globulin Ratio: 1 — ABNORMAL LOW (ref 1.2–2.2)
Albumin: 3.4 g/dL — ABNORMAL LOW (ref 3.9–4.9)
Alkaline Phosphatase: 133 IU/L — ABNORMAL HIGH (ref 44–121)
BUN/Creatinine Ratio: 24 (ref 12–28)
BUN: 14 mg/dL (ref 8–27)
Bilirubin Total: 1.1 mg/dL (ref 0.0–1.2)
CO2: 26 mmol/L (ref 20–29)
Calcium: 9.1 mg/dL (ref 8.7–10.3)
Chloride: 98 mmol/L (ref 96–106)
Creatinine, Ser: 0.59 mg/dL (ref 0.57–1.00)
Globulin, Total: 3.4 g/dL (ref 1.5–4.5)
Glucose: 227 mg/dL — ABNORMAL HIGH (ref 70–99)
Potassium: 3.3 mmol/L — ABNORMAL LOW (ref 3.5–5.2)
Sodium: 137 mmol/L (ref 134–144)
Total Protein: 6.8 g/dL (ref 6.0–8.5)
eGFR: 99 mL/min/{1.73_m2} (ref >59)

## 2021-12-19 LAB — LIPID PANEL
Chol/HDL Ratio: 4.4 ratio (ref 0.0–4.4)
Cholesterol, Total: 168 mg/dL (ref 100–199)
HDL: 38 mg/dL — ABNORMAL LOW (ref >39)
LDL Chol Calc (NIH): 105 mg/dL — ABNORMAL HIGH (ref 0–99)
Triglycerides: 139 mg/dL (ref 0–149)
VLDL Cholesterol Cal: 25 mg/dL (ref 5–40)

## 2021-12-19 NOTE — Patient Outreach (Addendum)
  Care Coordination   12/19/2021 Name: Grace Stokes MRN: [id number] DOB: 01-28-1956   Care Coordination Outreach Attempts:  A second unsuccessful outreach was attempted today to offer the patient with information about available care coordination services as a benefit of their health plan.     Follow Up Plan:  Additional outreach attempts will be made to offer the patient care coordination information and services.   Encounter Outcome:  No Answer  Care Coordination Interventions Activated:  No   Care Coordination Interventions:  No, not indicated      SIG Anton Cheramie L. Lavina Hamman, RN, BSN, [place] Coordinator Office number [phone]

## 2021-12-22 ENCOUNTER — Encounter: Payer: Self-pay | Admitting: Family Medicine

## 2021-12-23 ENCOUNTER — Encounter: Payer: Self-pay | Admitting: Family Medicine

## 2021-12-24 ENCOUNTER — Encounter: Payer: Self-pay | Admitting: Family Medicine

## 2021-12-26 ENCOUNTER — Ambulatory Visit: Payer: Self-pay | Admitting: *Deleted

## 2021-12-26 ENCOUNTER — Ambulatory Visit (INDEPENDENT_AMBULATORY_CARE_PROVIDER_SITE_OTHER): Payer: Medicare HMO

## 2021-12-26 VITALS — Wt 125.0 lb

## 2021-12-26 DIAGNOSIS — Z0000 Encounter for general adult medical examination without abnormal findings: Secondary | ICD-10-CM | POA: Diagnosis not present

## 2021-12-26 NOTE — Patient Outreach (Addendum)
  Care Coordination   12/26/2021 Name: Grace Stokes MRN: [id number] DOB: 1956-01-01   Care Coordination Outreach Attempts:  A third unsuccessful outreach was attempted today to offer the patient with information about available care coordination services as a benefit of their health plan.   Pt left a voice message on 12/22/21 1140 to confirm a follow up completed with her pcp. The office pharmacy is working on her Trulicity. She reports she is bed ridden and has issues with getting to the phone to answer it     Follow Up Plan:  No further outreach attempts will be made at this time. We have been unable to contact the patient to offer or enroll patient in care coordination services  Encounter Outcome:  No Answer  Care Coordination Interventions Activated:  No   Care Coordination Interventions:  No, not indicated    SIG Krystol Rocco L. Lavina Hamman, RN, BSN, [place] Coordinator Office number [phone]

## 2021-12-26 NOTE — Patient Instructions (Signed)
[name] , Thank you for taking time to come for your Medicare Wellness Visit. I appreciate your ongoing commitment to your health goals. Please review the following plan we discussed and let me know if I can assist you in the future.   Screening recommendations/referrals: Colonoscopy: Done 10/10/2021 -  Repeat in 10 years Mammogram: Done 10/22/2020 - no longer required, but may be done annually Bone Density: Declined - unable to lie flat Recommended yearly ophthalmology/optometry visit for glaucoma screening and checkup Recommended yearly dental visit for hygiene and checkup  Vaccinations: Influenza vaccine: Done 2022 - repeat annually in fall Pneumococcal vaccine: Complete Tdap vaccine: Done 05/08/2021 - Repeat in 10 years  Shingles vaccine: Complete   Covid-19: Done x2 - get booster in Fall 2023  Advanced directives: Please bring a copy of your health care power of attorney and living will to the office to be added to your chart at your convenience.   Conditions/risks identified: Each day, aim for 6 glasses of water, plenty of protein in your diet and try to get up and walk/ stretch every hour for 5-10 minutes at a time.    Next appointment: Follow up in one year for your annual wellness visit    Preventive Care 65 Years and Older, Female Preventive care refers to lifestyle choices and visits with your health care provider that can promote health and wellness. What does preventive care include? A yearly physical exam. This is also called an annual well check. Dental exams once or twice a year. Routine eye exams. Ask your health care provider how often you should have your eyes checked. Personal lifestyle choices, including: Daily care of your teeth and gums. Regular physical activity. Eating a healthy diet. Avoiding tobacco and drug use. Limiting alcohol use. Practicing safe sex. Taking low-dose aspirin every day. Taking vitamin and mineral supplements as recommended by your  health care provider. What happens during an annual well check? The services and screenings done by your health care provider during your annual well check will depend on your age, overall health, lifestyle risk factors, and family history of disease. Counseling  Your health care provider may ask you questions about your: Alcohol use. Tobacco use. Drug use. Emotional well-being. Home and relationship well-being. Sexual activity. Eating habits. History of falls. Memory and ability to understand (cognition). Work and work Statistician. Reproductive health. Screening  You may have the following tests or measurements: Height, weight, and BMI. Blood pressure. Lipid and cholesterol levels. These may be checked every 5 years, or more frequently if you are over 64 years old. Skin check. Lung cancer screening. You may have this screening every year starting at age 55 if you have a 30-pack-year history of smoking and currently smoke or have quit within the past 15 years. Fecal occult blood test (FOBT) of the stool. You may have this test every year starting at age 46. Flexible sigmoidoscopy or colonoscopy. You may have a sigmoidoscopy every 5 years or a colonoscopy every 10 years starting at age 16. Hepatitis C blood test. Hepatitis B blood test. Sexually transmitted disease (STD) testing. Diabetes screening. This is done by checking your blood sugar (glucose) after you have not eaten for a while (fasting). You may have this done every 1-3 years. Bone density scan. This is done to screen for osteoporosis. You may have this done starting at age 71. Mammogram. This may be done every 1-2 years. Talk to your health care provider about how often you should have regular mammograms.  Talk with your health care provider about your test results, treatment options, and if necessary, the need for more tests. Vaccines  Your health care provider may recommend certain vaccines, such as: Influenza vaccine.  This is recommended every year. Tetanus, diphtheria, and acellular pertussis (Tdap, Td) vaccine. You may need a Td booster every 10 years. Zoster vaccine. You may need this after age 74. Pneumococcal 13-valent conjugate (PCV13) vaccine. One dose is recommended after age 17. Pneumococcal polysaccharide (PPSV23) vaccine. One dose is recommended after age 70. Talk to your health care provider about which screenings and vaccines you need and how often you need them. This information is not intended to replace advice given to you by your health care provider. Make sure you discuss any questions you have with your health care provider. Document Released: 04/26/2015 Document Revised: 12/18/2015 Document Reviewed: 01/29/2015 Elsevier Interactive Patient Education  2017 [employer] Prevention in the Home Falls can cause injuries. They can happen to people of all ages. There are many things you can do to make your home safe and to help prevent falls. What can I do on the outside of my home? Regularly fix the edges of walkways and driveways and fix any cracks. Remove anything that might make you trip as you walk through a door, such as a raised step or threshold. Trim any bushes or trees on the path to your home. Use bright outdoor lighting. Clear any walking paths of anything that might make someone trip, such as rocks or tools. Regularly check to see if handrails are loose or broken. Make sure that both sides of any steps have handrails. Any raised decks and porches should have guardrails on the edges. Have any leaves, snow, or ice cleared regularly. Use sand or salt on walking paths during winter. Clean up any spills in your garage right away. This includes oil or grease spills. What can I do in the bathroom? Use night lights. Install grab bars by the toilet and in the tub and shower. Do not use towel bars as grab bars. Use non-skid mats or decals in the tub or shower. If you need to sit  down in the shower, use a plastic, non-slip stool. Keep the floor dry. Clean up any water that spills on the floor as soon as it happens. Remove soap buildup in the tub or shower regularly. Attach bath mats securely with double-sided non-slip rug tape. Do not have throw rugs and other things on the floor that can make you trip. What can I do in the bedroom? Use night lights. Make sure that you have a light by your bed that is easy to reach. Do not use any sheets or blankets that are too big for your bed. They should not hang down onto the floor. Have a firm chair that has side arms. You can use this for support while you get dressed. Do not have throw rugs and other things on the floor that can make you trip. What can I do in the kitchen? Clean up any spills right away. Avoid walking on wet floors. Keep items that you use a lot in easy-to-reach places. If you need to reach something above you, use a strong step stool that has a grab bar. Keep electrical cords out of the way. Do not use floor polish or wax that makes floors slippery. If you must use wax, use non-skid floor wax. Do not have throw rugs and other things on the floor that can make  you trip. What can I do with my stairs? Do not leave any items on the stairs. Make sure that there are handrails on both sides of the stairs and use them. Fix handrails that are broken or loose. Make sure that handrails are as long as the stairways. Check any carpeting to make sure that it is firmly attached to the stairs. Fix any carpet that is loose or worn. Avoid having throw rugs at the top or bottom of the stairs. If you do have throw rugs, attach them to the floor with carpet tape. Make sure that you have a light switch at the top of the stairs and the bottom of the stairs. If you do not have them, ask someone to add them for you. What else can I do to help prevent falls? Wear shoes that: Do not have high heels. Have rubber bottoms. Are  comfortable and fit you well. Are closed at the toe. Do not wear sandals. If you use a stepladder: Make sure that it is fully opened. Do not climb a closed stepladder. Make sure that both sides of the stepladder are locked into place. Ask someone to hold it for you, if possible. Clearly mark and make sure that you can see: Any grab bars or handrails. First and last steps. Where the edge of each step is. Use tools that help you move around (mobility aids) if they are needed. These include: Canes. Walkers. Scooters. Crutches. Turn on the lights when you go into a dark area. Replace any light bulbs as soon as they burn out. Set up your furniture so you have a clear path. Avoid moving your furniture around. If any of your floors are uneven, fix them. If there are any pets around you, be aware of where they are. Review your medicines with your doctor. Some medicines can make you feel dizzy. This can increase your chance of falling. Ask your doctor what other things that you can do to help prevent falls. This information is not intended to replace advice given to you by your health care provider. Make sure you discuss any questions you have with your health care provider. Document Released: 01/24/2009 Document Revised: 09/05/2015 Document Reviewed: 05/04/2014 Elsevier Interactive Patient Education  2017 [employer].

## 2021-12-26 NOTE — Progress Notes (Signed)
Subjective:   Grace Stokes is a 66 y.o. female who presents for Medicare Annual (Subsequent) preventive examination.  Virtual Visit via Telephone Note  I connected with  Grace Stokes on 12/26/21 at 11:15 AM EDT by telephone and verified that I am speaking with the correct person using two identifiers.  Location: Patient: Home Provider: [facility] Persons participating in the virtual visit: patient/Nurse Health Advisor   I discussed the limitations, risks, security and privacy concerns of performing an evaluation and management service by telephone and the availability of in person appointments. The patient expressed understanding and agreed to proceed.  Interactive audio and video telecommunications were attempted between this nurse and patient, however failed, due to patient having technical difficulties OR patient did not have access to video capability.  We continued and completed visit with audio only.  Some vital signs may be absent or patient reported.   Amaris Garrette E Hardeep Reetz, LPN   Review of Systems     Cardiac Risk Factors include: advanced age (>36mn, >>37women);diabetes mellitus;dyslipidemia;hypertension;sedentary lifestyle;Other (see comment);smoking/ tobacco exposure, Risk factor comments: chronic Hepatitis B, cirrhosis     Objective:    Today's Vitals   12/26/21 1133 12/26/21 1134  Weight: 125 lb (56.7 kg)   PainSc:  8    Body mass index is 26.13 kg/m.     12/26/2021   11:45 AM 10/10/2021    7:40 AM 10/08/2021   10:00 AM 05/15/2021   10:04 AM 12/25/2020    1:35 PM 07/31/2019    2:27 PM 02/04/2019   12:10 AM  Advanced Directives  Does Patient Have a Medical Advance Directive? Yes Yes No No Yes Yes No  Type of Advance Directive Out of facility DNR (pink MOST or yellow form) Living will   Living will;Healthcare Power of Attorney Living will;Healthcare Power of Attorney   Does patient want to make changes to medical advance directive?      No - Patient declined    Copy of [place] Chart?     No - copy requested No - copy requested   Would patient like information on creating a medical advance directive?   No - Patient declined No - Patient declined  No - Patient declined No - Patient declined    Current Medications (verified) Outpatient Encounter Medications as of 12/26/2021  Medication Sig   aspirin EC 81 MG tablet Take 81 mg by mouth daily. Swallow whole.   Buprenorphine HCl-Naloxone HCl (SUBOXONE) 8-2 MG FILM Place 1 Film under the tongue in the morning and at bedtime.   carvedilol (COREG) 12.5 MG tablet Take by mouth.   Dulaglutide (TRULICITY) 4.5 MKG/2.5KYSOPN Inject 4.5 mg as directed once a week.   DULoxetine (CYMBALTA) 30 MG capsule Take 1 capsule (30 mg total) by mouth daily.   entecavir (BARACLUDE) 1 MG tablet Take 1 mg by mouth at bedtime.   Evolocumab (REPATHA SURECLICK) 1706MG/ML SOAJ Inject 140 mg into the skin every 14 (fourteen) days.   lisinopril (ZESTRIL) 20 MG tablet Take 1 tablet (20 mg total) by mouth daily. (Patient taking differently: Take 20 mg by mouth at bedtime.)   mirtazapine (REMERON) 15 MG tablet Take 1 tablet (15 mg total) by mouth at bedtime.   Vitamin D, Ergocalciferol, (DRISDOL) 1.25 MG (50000 UNIT) CAPS capsule Take 1 capsule (50,000 Units total) by mouth every Sunday.   No facility-administered encounter medications on file as of 12/26/2021.    Allergies (verified) Gabapentin   History: Past  Medical History:  Diagnosis Date   Anemia    Arthritis    Bell's palsy    neuropathy    CAD (coronary artery disease)    Cataract    Chronic pain    Cirrhosis (HCC)    COPD (chronic obstructive pulmonary disease) (HCC)    Depression    Diabetes mellitus type 2 in nonobese ([place])    Hypertension    IV drug abuse ([place])    Scoliosis    Past Surgical History:  Procedure Laterality Date   ANTERIOR CERVICAL DECOMP/DISCECTOMY FUSION N/A 07/09/2013   Procedure: ANTERIOR CERVICAL  DECOMPRESSION/DISCECTOMY FUSION 2 LEVELS;  Surgeon: Consuella Lose, MD;  [facility];  Service: Neurosurgery;  Laterality: N/A;  Anterior Cervical Fusion Cervical five-six, six-seven.   BACK SURGERY     x 5   CARPAL TUNNEL RELEASE     CESAREAN SECTION     COLONOSCOPY  2017   [facility]; external and internal hemorrhoids, 1 small polyp in the descending colon resected, but not retrieved.   COLONOSCOPY WITH PROPOFOL N/A 10/10/2021   Procedure: COLONOSCOPY WITH PROPOFOL;  Surgeon: Eloise Harman, DO;  [facility]: [facility];  Service: Endoscopy;  Laterality: N/A;  9:00am   ELBOW SURGERY Left    ESOPHAGOGASTRODUODENOSCOPY (EGD) WITH PROPOFOL N/A 10/10/2021   Procedure: ESOPHAGOGASTRODUODENOSCOPY (EGD) WITH PROPOFOL;  Surgeon: Eloise Harman, DO;  [facility];  Service: Endoscopy;  Laterality: N/A;   I & D EXTREMITY Left 12/17/2016   Procedure: IRRIGATION AND DEBRIDEMENT LEFT HAND;  Surgeon: Iran Planas, MD;  Location: [facility];  Service: Orthopedics;  Laterality: Left;   INCISION / DRAINAGE HAND / FINGER Left 12/17/2016   ROTATOR CUFF REPAIR     SHOULDER SURGERY     TUBAL LIGATION     Family History  Problem Relation Age of Onset   COPD Mother    Stroke Mother    Arthritis Brother        back issues   Cancer Brother        skin / back    Obesity Son    Colon cancer Neg Hx    Social History   Socioeconomic History   Marital status: Married    Spouse name: Tim   Number of children: 1   Years of education: Not on file   Highest education level: Not on file  Occupational History   Occupation: disability   Tobacco Use   Smoking status: Every Day    Packs/day: 0.50    Types: Cigarettes   Smokeless tobacco: Never  Vaping Use   Vaping Use: Never used  Substance and Sexual Activity   Alcohol use: Not Currently   Drug use: Not Currently    Comment: Previously injected illicit Fentanyl.  Clean since February 2023.   Sexual activity: Not on file  Other  Topics Concern   Not on file  Social History Narrative   Son lives 15 minutes away and hasn't visited in over a year.   Social Determinants of Health   Financial Resource Strain: High Risk (12/26/2021)   Overall Financial Resource Strain (CARDIA)    Difficulty of Paying Living Expenses: Hard  Food Insecurity: Food Insecurity Present (12/26/2021)   Hunger Vital Sign    Worried About Running Out of Food in the Last Year: Sometimes true    Ran Out of Food in the Last Year: Sometimes true  Transportation Needs: No Transportation Needs (12/26/2021)   PRAPARE - Transportation  Lack of Transportation (Medical): No    Lack of Transportation (Non-Medical): No  Physical Activity: Insufficiently Active (12/26/2021)   Exercise Vital Sign    Days of Exercise per Week: 7 days    Minutes of Exercise per Session: 10 min  Stress: Stress Concern Present (12/26/2021)   [place]    Feeling of Stress : To some extent  Social Connections: Moderately Isolated (12/26/2021)   Social Connection and Isolation Panel [NHANES]    Frequency of Communication with Friends and Family: More than three times a week    Frequency of Social Gatherings with Friends and Family: Once a week    Attends Religious Services: Never    Marine scientist or Organizations: No    Attends Music therapist: Never    Marital Status: Married    Tobacco Counseling Ready to quit: Not Answered Counseling given: Not Answered   Clinical Intake:  Pre-visit preparation completed: Yes  Pain : 0-10 Pain Score: 8  Pain Type: Chronic pain Pain Location: Back Pain Orientation: Right Pain Radiating Towards: leg Pain Descriptors / Indicators: Spasm, Sharp, Constant Pain Onset: More than a month ago Pain Frequency: Constant     BMI - recorded: 26.13 Nutritional Status: BMI 25 -29 Overweight Nutritional Risks: Nausea/ vomitting/ diarrhea,  Non-healing wound, Unintentional weight loss Diabetes: Yes CBG done?: No  How often do you need to have someone help you when you read instructions, pamphlets, or other written materials from your doctor or pharmacy?: 1 - Never  Diabetic? Nutrition Risk Assessment:  Has the patient had any N/V/D within the last 2 months?  Yes  Does the patient have any non-healing wounds?  Yes  Has the patient had any unintentional weight loss or weight gain?  Yes   Diabetes:  Is the patient diabetic?  Yes  If diabetic, was a CBG obtained today?  No  Did the patient bring in their glucometer from home?  No  How often do you monitor your CBG's? Never.   Financial Strains and Diabetes Management:  Are you having any financial strains with the device, your supplies or your medication? Yes .  Does the patient want to be seen by Chronic Care Management for management of their diabetes?  Yes  Would the patient like to be referred to a Nutritionist or for Diabetic Management?  No   Diabetic Exams:  Diabetic Eye Exam: Completed 3/13/223.   Diabetic Foot Exam: Completed 01/16/2021. Pt has been advised about the importance in completing this exam. Pt is scheduled for diabetic foot exam on next in person visit with PCP.    Interpreter Needed?: No  Information entered by :: Juliene Kirsh, LPN   Activities of Daily Living    12/26/2021   11:45 AM 10/08/2021   10:02 AM  In your present state of health, do you have any difficulty performing the following activities:  Hearing? 0   Vision? 0   Difficulty concentrating or making decisions? 0   Walking or climbing stairs? 1   Dressing or bathing? 1   Doing errands, shopping? 1 0  Preparing Food and eating ? Y   Using the Toilet? Y   In the past six months, have you accidently leaked urine? Y   Do you have problems with loss of bowel control? Y   Managing your Medications? N   Managing your Finances? N   Housekeeping or managing your Housekeeping? Darreld Mclean  Patient Care Team: Loman Brooklyn, FNP as PCP - General (Family Medicine) Roosevelt Locks, CRNP as Nurse Practitioner (Nephrology) Shea Evans Norva Riffle, LCSW as [place] Management (Licensed Clinical Social Worker) [place] Comment, Allison Quarry, MD as Referring Physician (Optometry) Abbey Chatters, Elon Alas, DO as Consulting Physician (Gastroenterology) Celestia Khat, [place] (Optometry) Lavera Guise, [facility] as Pharmacist (Family Medicine) Comer, Okey Regal, MD as Consulting Physician (Infectious Diseases)  Indicate any recent Medical Services you may have received from other than [facility] providers in the past year (date may be approximate).     Assessment:   This is a routine wellness examination for [place].  Hearing/Vision screen Hearing Screening - Comments:: Denies hearing difficulties   Vision Screening - Comments:: Wears rx glasses - up to date with routine eye exams with [facility]  Dietary issues and exercise activities discussed: Current Exercise Habits: Home exercise routine, Type of exercise: stretching, Time (Minutes): 10, Frequency (Times/Week): 7, Weekly Exercise (Minutes/Week): 70, Intensity: Mild, Exercise limited by: orthopedic condition(s);psychological condition(s);respiratory conditions(s)   Goals Addressed               This Visit's Progress     manage fall prevention home care [facility]) (pt-stated)   On track      Care Coordination Interventions: Unsuccessful outreaches by RN CM x 2 Unsuccessful outreach by [place] guide A Livengood x 3 Care guide case closed 12/09/21 An e-mail ws sent to [place] annual wellness nurse to request patient return calls to RN CM and/or A Livengood while she is at her annual wellness visit  12/26/21 - I gave patient call back information      manage medicine costs [facility]) (pt-stated)   On track     Care Coordination Interventions: Unsuccessful outreaches by RN CM x 2 Unsuccessful outreach by [place] guide A Livengood x 3 Care guide  case closed 12/09/21 An e-mail ws sent to [place] annual wellness nurse to request patient return calls to RN CM and/or A Livengood while she is at her annual wellness visit  12/26/21 - I gave patient call back information      T2DM, HLD PHARMD GOAL (pt-stated)   On track     Current Barriers:  Unable to independently afford treatment regimen Unable to achieve control of T2DM, HLD  Suboptimal therapeutic regimen for T2DM, HLD  Pharmacist Clinical Goal(s):  patient will verbalize ability to afford treatment regimen achieve control of T2DM, HLD as evidenced by IMPROVED GLYCEMIC CONTROL through collaboration with PharmD and provider.   Interventions: 1:1 collaboration with Loman Brooklyn, FNP regarding development and update of comprehensive plan of care as evidenced by provider attestation and co-signature Inter-disciplinary care team collaboration (see longitudinal plan of care) Comprehensive medication review performed; medication list updated in electronic medical record  Diabetes: Goal on Track (progressing): YES. Uncontrolled--A1C 13%-->12.4%; current treatment: TRULICITY 1.'5MG'$ -->increased to TRULICITY '3mg'$  WEEKLY(patient has started this);  CAN'T AFFORD; ENROLLED IN PATIENT ASSISTANCE VIA [facility] ESCRIBE TO [facility] Denies personal and family history of Medullary thyroid cancer (MTC) PATIENT USES ACCUCHECK GUIDE GLUCOMETER WILL LIKELY HAVE TO ADD ANOTHER MEDICATION AT F/U Current glucose readings: fasting glucose: N/A, post prandial glucose: N/A Discussed meal planning options and Plate method for healthy eating Avoid sugary drinks and desserts Incorporate balanced protein, non starchy veggies, 1 serving of carbohydrate with each meal Increase water intake Increase physical activity as able Current exercise: N/A--WALKS WITH WALKER; UNABLE TO PARTICIPATE IN EXERCISE Counseled on TRULICITY, GLUCOMETER Recommended TRULICITY,  WILL ADD ADDITIONAL MEDICATION AT  F/U Assessed patient finances. ENROLLED IN THE [facility] PATIENT ASSISTANCE PROGRAM Hyperlipidemia:  New goal. Uncontrolled; current treatment: N/A DUE TO LIVER;  CONSIDER NON-STATIN ALTERNATIVE-->REPATHA WAS APPROVED; UNSURE OF COST (PATIENT TO LET ME KNOW; [facility] IS CLOSED, BUT WE CAN GET NEXT YEAR) WILL CHECK WITH GI Medications previously tried: N/A  Current dietary patterns: RECOMMENDED HEART HEALTHY DIET   Lipid Panel     Component Value Date/Time   CHOL 185 08/27/2021 1357   TRIG 131 08/27/2021 1357   HDL 46 08/27/2021 1357   CHOLHDL 4.0 08/27/2021 1357   LDLCALC 116 (H) 08/27/2021 1357   LABVLDL 23 08/27/2021 1357   Patient Goals/Self-Care Activities patient will:  - take medications as prescribed as evidenced by patient report and record review check glucose DAILY FASTING, document, and provide at future appointments collaborate with provider on medication access solutions engage in dietary modifications by FOLLOWING HEART HEALTHY/HEALTHY PLATE METHOD        Depression Screen    12/26/2021   11:41 AM 12/18/2021   10:56 AM 12/04/2021    8:48 PM 10/15/2021    2:32 PM 09/17/2021    2:22 PM 08/27/2021    2:11 PM 07/10/2021    3:14 PM  PHQ 2/9 Scores  PHQ - 2 Score '4 4 2 3 3 1 2  '$ PHQ- 9 Score '12 12 8 10 9 8 9    '$ Fall Risk    12/26/2021   11:36 AM 12/18/2021   10:44 AM 09/17/2021    2:22 PM 08/27/2021    2:10 PM 07/10/2021   10:53 AM  Fall Risk   Falls in the past year? 1 1 0 0 0  Number falls in past yr: 1 1   0  Injury with Fall? 1 1   0  Risk for fall due to : History of fall(s);Impaired balance/gait;Orthopedic patient;Impaired mobility History of fall(s)   Impaired balance/gait  Follow up Education provided;Falls prevention discussed Falls evaluation completed   Falls evaluation completed    FALL RISK PREVENTION PERTAINING TO THE HOME:  Any stairs in or around the home? No  If so, are there any without handrails? No  Home free of loose throw rugs in  walkways, pet beds, electrical cords, etc? Yes  Adequate lighting in your home to reduce risk of falls? Yes   ASSISTIVE DEVICES UTILIZED TO PREVENT FALLS:  Life alert? No  Use of a cane, walker or w/c? Yes  Grab bars in the bathroom? Yes  Shower chair or bench in shower? Yes  Elevated toilet seat or a handicapped toilet? Yes   TIMED UP AND GO:  Was the test performed? No . Telephonic visit  Cognitive Function:        12/26/2021   11:48 AM 12/25/2020    1:44 PM 07/31/2019    2:31 PM  6CIT Screen  What Year? 0 points 0 points   What month? 0 points 0 points   What time? 0 points 0 points 0 points  Count back from 20 0 points 0 points 0 points  Months in reverse 0 points 0 points 0 points  Repeat phrase 0 points 0 points 0 points  Total Score 0 points 0 points     Immunizations Immunization History  Administered Date(s) Administered   Fluad Quad(high Dose 65+) 01/16/2021   Influenza,inj,Quad PF,6+ Mos 01/13/2017, 03/15/2018, 12/23/2018   Moderna Sars-Covid-2 Vaccination 10/14/2019, 11/11/2019   PNEUMOCOCCAL CONJUGATE-20 05/08/2021   Tdap 05/08/2021  Zoster Recombinat (Shingrix) 08/27/2021, 12/18/2021    TDAP status: Up to date  Flu Vaccine status: Up to date  Pneumococcal vaccine status: Up to date  Covid-19 vaccine status: Completed vaccines  Qualifies for Shingles Vaccine? Yes   Zostavax completed Yes   Shingrix Completed?: Yes  Screening Tests Health Maintenance  Topic Date Due   COVID-19 Vaccine (3 - Moderna risk series) 01/02/2022 (Originally 12/09/2019)   INFLUENZA VACCINE  07/12/2022 (Originally 11/11/2021)   FOOT EXAM  01/16/2022   Diabetic kidney evaluation - Urine ACR  06/18/2022   HEMOGLOBIN A1C  06/18/2022   OPHTHALMOLOGY EXAM  06/24/2022   MAMMOGRAM  10/23/2022   Diabetic kidney evaluation - GFR measurement  12/19/2022   TETANUS/TDAP  05/09/2031   COLONOSCOPY (Pts 45-55yr Insurance coverage will need to be confirmed)  10/11/2031   Pneumonia  Vaccine 66 Years old  Completed   Hepatitis C Screening  Completed   Zoster Vaccines- Shingrix  Completed   HPV VACCINES  Aged Out   DEXA SCAN  Discontinued    Health Maintenance  There are no preventive care reminders to display for this patient.  Colorectal cancer screening: Type of screening: Colonoscopy. Completed 10/10/2021. Repeat every 10 years  Mammogram status: No longer required due to patient declines - discussed with Britney.  DEXA: declined - unable to lie flat  Lung Cancer Screening: (Low Dose CT Chest recommended if Age 66-80years, 30 pack-year currently smoking OR have quit w/in 15years.) does not qualify.   Lung Cancer Screening Referral: too many co-morbidities   Additional Screening:  Hepatitis C Screening: does qualify; Completed 12/23/2018  Vision Screening: Recommended annual ophthalmology exams for early detection of glaucoma and other disorders of the eye. Is the patient up to date with their annual eye exam?  Yes  Who is the provider or what is the name of the office in which the patient attends annual eye exams[place] is not established with a provider, would they like to be referred to a provider to establish care? No .   Dental Screening: Recommended annual dental exams for proper oral hygiene  Community Resource Referral / Chronic Care Management: CRR required this visit?  No   CCM required this visit?  No      Plan:     I have personally reviewed and noted the following in the patient's chart:   Medical and social history Use of alcohol, tobacco or illicit drugs  Current medications and supplements including opioid prescriptions. Patient is currently taking opioid prescriptions. Information provided to patient regarding non-opioid alternatives. Patient advised to discuss non-opioid treatment plan with their provider. Functional ability and status Nutritional status Physical activity Advanced directives List of other  physicians Hospitalizations, surgeries, and ER visits in previous 12 months Vitals Screenings to include cognitive, depression, and falls Referrals and appointments  In addition, I have reviewed and discussed with patient certain preventive protocols, quality metrics, and best practice recommendations. A written personalized care plan for preventive services as well as general preventive health recommendations were provided to patient.     ASandrea Hammond LPN   [id number]  Nurse Notes: CCM has been trying to reach patient - I gave her their contact information - she is still having food insecurity, financial strains, and needs PT in home to help her get up and moving.

## 2021-12-29 ENCOUNTER — Telehealth: Payer: Self-pay | Admitting: *Deleted

## 2021-12-29 NOTE — Patient Outreach (Signed)
  Care Coordination   Follow Up Visit Note   12/29/2021 Name: Grace Stokes MRN: [id number] DOB: 01-15-1956  Grace Stokes is a 66 y.o. year old female who sees Loman Brooklyn, FNP for primary care. I spoke with  [name] by phone today.  What matters to the patients health and wellness today?  Patient returned a call to RN CM and confirms she medication assistance has been provided  Still bed ridden but can turn herself over in the bed & able to get up to toilet  Falls # recent falls (11/19/21, 12/11/21, 12/12/21) Husband brought her a cloth knee immobilizer as the last fall occurred because her knee gave way while getting to the bedside commode  12/18/21 Referrals for home health and neurologist pending   Goals Addressed               This Visit's Progress     Patient Stated     manage fall prevention home care [facility]) (pt-stated)   Not on track      Care Coordination Interventions: Assessed for falls since last encounter She confirmed her falls were on 11/19/21, 12/11/21 & 12/12/21. Her last fall occurred because her knee gave way as she was getting to the bedside commode. Her husband purchased a cloth knee immobilizer. She does not have any other AFOs/splints.   Still pending home health outreach per patient & RN CM left a message for Loma Sousa, Referral coordinator about updates on the neurology referral  With review of 12/18/21 referral info in [place], [facility] accepted patient for services. Outreach to [facility] in [place] No answer. Outreach to [facility] in [place] and spoke with Vikki Ports to confirm patient was outreached by their staff x 3 without an return call.   RN CM left patient a message to return a call to [facility] at [phone]      COMPLETED: manage medicine costs [facility]) (pt-stated)   On track     Care Coordination Interventions: Pharmacy referral for medication assistance completed by [facility] staff  Discussed plans with patient for ongoing  care management follow up and provided patient with direct contact information for care management team Patient confirmed with RN CM that she received her medicine. She continues to be bed ridden        SDOH assessments and interventions completed:  No  SDOH Interventions Today    Flowsheet Row Most Recent Value  SDOH Interventions   Housing Interventions Intervention Not Indicated  Transportation Interventions Intervention Not Indicated        Care Coordination Interventions Activated:  Yes  Care Coordination Interventions:  Yes, provided   Follow up plan: Follow up call scheduled for 01/05/22    Encounter Outcome:  Pt. Visit Completed   Benicio Manna L. Lavina Hamman, RN, BSN, [place] Coordinator Office number [phone]

## 2021-12-29 NOTE — Patient Instructions (Signed)
Visit Information  Thank you for taking time to visit with me today. Please don't hesitate to contact me if I can be of assistance to you.   Following are the goals we discussed today:   Goals Addressed               This Visit's Progress     Patient Stated     manage fall prevention home care [facility]) (pt-stated)   Not on track      Care Coordination Interventions: Assessed for falls since last encounter She confirmed her falls were on 11/19/21, 12/11/21 & 12/12/21. Her last fall occurred because her knee gave way as she was getting to the bedside commode. Her husband purchased a cloth knee immobilizer. She does not have any other AFOs/splints.   Still pending home health outreach per patient & RN CM left a message for Loma Sousa, Referral coordinator about updates on the neurology referral  With review of 12/18/21 referral info in [place], [place] accepted patient for services. Outreach to [facility] in [place] No answer. Outreach to [facility] in [place] and spoke with Vikki Ports to confirm patient was outreached by their staff x 3 without an return call.   RN CM left patient a message to return a call to [facility] at [phone]      COMPLETED: manage medicine costs [facility]) (pt-stated)   On track     Care Coordination Interventions: Pharmacy referral for medication assistance completed by [facility] staff  Discussed plans with patient for ongoing care management follow up and provided patient with direct contact information for care management team Patient confirmed with RN CM that she received her medicine. She continues to be bed ridden        Our next appointment is by telephone on 01/05/22 at 2 pm  Please call the care guide team at [phone] if you need to cancel or reschedule your appointment.   If you are experiencing a Mental Health or [place] or need someone to talk to, please call the Suicide and Crisis Lifeline: 988 call the [place] National Suicide Prevention  Lifeline: 636-616-7798 or TTY: 762-255-8910 TTY 332-690-3119) to talk to a trained counselor call 1-800-273-TALK (toll free, 24 hour hotline) call the [facility]: (743)025-0722 call 911   Patient verbalizes understanding of instructions and care plan provided today and agrees to view in [place]. Active MyChart status and patient understanding of how to access instructions and care plan via MyChart confirmed with patient.     The patient has been provided with contact information for the care management team and has been advised to call with any health related questions or concerns.   [place] Lavina Hamman, RN, BSN, [place] Coordinator Office number [phone]

## 2021-12-31 DIAGNOSIS — F1120 Opioid dependence, uncomplicated: Secondary | ICD-10-CM | POA: Diagnosis not present

## 2021-12-31 DIAGNOSIS — E119 Type 2 diabetes mellitus without complications: Secondary | ICD-10-CM | POA: Diagnosis not present

## 2021-12-31 DIAGNOSIS — Z79899 Other long term (current) drug therapy: Secondary | ICD-10-CM | POA: Diagnosis not present

## 2021-12-31 DIAGNOSIS — R030 Elevated blood-pressure reading, without diagnosis of hypertension: Secondary | ICD-10-CM | POA: Diagnosis not present

## 2021-12-31 DIAGNOSIS — G959 Disease of spinal cord, unspecified: Secondary | ICD-10-CM | POA: Diagnosis not present

## 2021-12-31 DIAGNOSIS — Z6826 Body mass index (BMI) 26.0-26.9, adult: Secondary | ICD-10-CM | POA: Diagnosis not present

## 2022-01-02 DIAGNOSIS — Z79899 Other long term (current) drug therapy: Secondary | ICD-10-CM | POA: Diagnosis not present

## 2022-01-05 ENCOUNTER — Ambulatory Visit: Payer: Self-pay | Admitting: *Deleted

## 2022-01-05 NOTE — Patient Outreach (Signed)
  Care Coordination   01/05/2022 Name: Grace Stokes MRN: [id number] DOB: 08-27-1955   Care Coordination Outreach Attempts:  An unsuccessful telephone outreach was attempted today to offer the patient information about available care coordination services as a benefit of their health plan.   Follow Up Plan:  Additional outreach attempts will be made to offer the patient care coordination information and services.   Encounter Outcome:  No Answer  Care Coordination Interventions Activated:  No   Care Coordination Interventions:  No, not indicated    SIG Shaman Muscarella L. Lavina Hamman, RN, BSN, [place] Coordinator Office number [phone]

## 2022-01-09 ENCOUNTER — Ambulatory Visit (INDEPENDENT_AMBULATORY_CARE_PROVIDER_SITE_OTHER): Payer: Medicare HMO | Admitting: Pharmacist

## 2022-01-09 DIAGNOSIS — E1165 Type 2 diabetes mellitus with hyperglycemia: Secondary | ICD-10-CM | POA: Diagnosis not present

## 2022-01-09 DIAGNOSIS — I10 Essential (primary) hypertension: Secondary | ICD-10-CM

## 2022-01-09 DIAGNOSIS — G720 Drug-induced myopathy: Secondary | ICD-10-CM

## 2022-01-09 DIAGNOSIS — K7460 Unspecified cirrhosis of liver: Secondary | ICD-10-CM | POA: Diagnosis not present

## 2022-01-09 DIAGNOSIS — E782 Mixed hyperlipidemia: Secondary | ICD-10-CM | POA: Diagnosis not present

## 2022-01-09 NOTE — Progress Notes (Signed)
Pharmacy Note  01/09/2022 Name:  Grace Stokes MRN:  [id number] DOB:  May 09, 1955  Summary:  Diabetes: Goal on Track (progressing): YES. Uncontrolled--A1C 12.4-->9.3%; current treatment: TRULICITY 54m WEEKLY, will plan to add SGLT2 when we apply for assistance;  CAN'T AFFORD TRULICITY; ENROLLED IN PATIENT ASSISTANCE VIA Grace Stokes(mail order for [facility]) Denies personal and family history of Medullary thyroid cancer (MTC) PATIENT USES ACCUCHECK GUIDE GLUCOMETER WILL LIKELY HAVE TO ADD ANOTHER MEDICATION AT F/U Current glucose readings: fasting glucose: N/A, post prandial glucose: N/A Discussed meal planning options and Plate method for healthy eating Avoid sugary drinks and desserts Incorporate balanced protein, non starchy veggies, 1 serving of carbohydrate with each meal Increase water intake Increase physical activity as able Current exercise: N/A--WALKS WITH WALKER; UNABLE TO PARTICIPATE IN EXERCISE Counseled on TRULICITY, GLUCOMETER Recommended TRULICITY, WILL ADD ADDITIONAL MEDICATION AT F/U Assessed patient finances. ENROLLED IN THE [facility] PATIENT ASSISTANCE PROGRAM Hyperlipidemia:  New goal. Uncontrolled; current treatment: N/A DUE TO LIVER;  CONSIDER NON-STATIN ALTERNATIVE-->REPATHA WAS APPROVED--patient can't afford, [facility] GRANT IS CLOSED) Nexlizet samples given to bridge patient to a more long term solution Medications previously tried: N/A  Current dietary patterns: RECOMMENDED HEART HEALTHY DIET  Lipid Panel     Component Value Date/Time   CHOL 168 12/18/2021 1048   TRIG 139 12/18/2021 1048   HDL 38 (L) 12/18/2021 1048   CHOLHDL 4.4 12/18/2021 1048   LDLCALC 105 (H) 12/18/2021 1048   LABVLDL 25 12/18/2021 1048   ING HEART HEALTHY/HEALTHY PLATE METHOD   Subjective: Grace Stokes an 66y.o. year old female who is a primary patient of Grace Stokes.  The CCM team was consulted for  assistance with disease management and care coordination needs.    Patient Care Team: Grace Stokes as PCP - General (Family Medicine) Grace Stokes as Nurse Practitioner (Nephrology) Grace Stokes as Grace Stokes (Licensed Clinical Social Worker) Grace Stokes as Referring Physician (Optometry) Grace Stokes as Consulting Physician (Gastroenterology) JCelestia Khat Stokes(Optometry) [facility] (Family Medicine) Grace Stokes as Consulting Physician (Infectious Diseases)  Objective:  Lab Results  Component Value Date   CREATININE 0.59 12/18/2021   CREATININE 0.43 (L) 10/08/2021   CREATININE 0.58 06/11/2021    Lab Results  Component Value Date   HGBA1C 9.3 (H) 12/18/2021   Last diabetic Eye exam:  Lab Results  Component Value Date/Time   HMDIABEYEEXA Retinopathy (A) 06/23/2021 12:00 AM    Last diabetic Foot exam: No results found for: "HMDIABFOOTEX"      Component Value Date/Time   CHOL 168 12/18/2021 1048   TRIG 139 12/18/2021 1048   HDL 38 (L) 12/18/2021 1048   CHOLHDL 4.4 12/18/2021 1048   L[place]105 (H) 12/18/2021 1048       Latest Ref Rng & Units 12/18/2021   10:48 AM 10/08/2021    9:48 AM 06/11/2021    4:07 PM  Hepatic Function  Total Protein 6.0 - 8.5 g/dL 6.8  7.4  7.8   Albumin 3.9 - 4.9 g/dL 3.4  3.1    AST 0 - 40 IU/L 40  33  29   ALT 0 - 32 IU/L _0 Alk Phosphatase 44 - 121 IU/L 133  114    Total Bilirubin 0.0 - 1.2 mg/dL 1.1  1.1  0.9  Lab Results  Component Value Date/Time   TSH 0.442 (L) 01/28/2016 02:18 PM       Latest Ref Rng & Units 12/18/2021   10:48 AM 10/08/2021    9:48 AM 08/11/2021    9:26 AM  CBC  WBC 3.4 - 10.8 x10E3/uL 2.4  2.7  2.4   Hemoglobin 11.1 - 15.9 g/dL 12.6  12.7  11.9   Hematocrit 34.0 - 46.6 % 34.9  36.2  36.8   Platelets 150 - 450 x10E3/uL 64  47  56     Lab Results  Component Value Date/Time   VD25OH 22.1 (L)  08/27/2021 03:53 PM   VD25OH 17.8 (L) 05/29/2021 02:51 PM    Clinical ASCVD: No  The 10-year ASCVD risk score (Arnett DK, et al., 2019) is: 28.3%   Values used to calculate the score:     Age: 16 years     Sex: Female     Is Non-Hispanic African American: No     Diabetic: Yes     Tobacco smoker: Yes     Systolic Blood Pressure: 092 mmHg     Is BP treated: Yes     HDL Cholesterol: 38 mg/dL     Total Cholesterol: 168 mg/dL    Other: (CHADS2VASc if Afib, PHQ9 if depression, MMRC or CAT for COPD, ACT, DEXA)  Social History   Tobacco Use  Smoking Status Every Day   Packs/day: 0.50   Types: Cigarettes  Smokeless Tobacco Never   BP Readings from Last 3 Encounters:  12/18/21 130/82  10/10/21 118/67  10/08/21 (!) 173/76   Pulse Readings from Last 3 Encounters:  12/18/21 92  10/10/21 90  10/08/21 99   Wt Readings from Last 3 Encounters:  12/26/21 125 lb (56.7 kg)  12/18/21 125 lb (56.7 kg)  10/10/21 126 lb 15.8 oz (57.6 kg)    Assessment: Review of patient past medical history, allergies, medications, health status, including review of consultants reports, laboratory and other test data, was performed as part of comprehensive evaluation and provision of chronic care management services.   SDOH:  (Social Determinants of Health) assessments and interventions performed:  SDOH Interventions    Flowsheet Row Telephone from 12/29/2021 in [place] from 12/26/2021 in [place] Visit from 12/18/2021 in [place] Management from 12/08/2021 in [place] from 12/04/2021 in [place] Management from 11/25/2021 in [place] Interventions        Food Insecurity Interventions -- Other (Comment)  Grace Stokes referred 3 weeks ago - gave pt phone #s  to return care management's calls] -- -- -- --  Housing Interventions Intervention Not Indicated Intervention Not Indicated -- -- -- --  Transportation Interventions Intervention Not Indicated Intervention Not Indicated -- -- Intervention Not Indicated --  Utilities Interventions -- Intervention Not Indicated -- -- -- --  Alcohol Usage Interventions -- Intervention Not Indicated (Score <7) -- -- -- --  Depression Interventions/Treatment  -- Currently on Treatment Currently on Treatment -- Counseling, Currently on Treatment --  Financial Strain Interventions -- Other (Comment)  [already handled 3 weeks ago] -- -- [id number] Referral --  Physical Activity Interventions -- Patient Refused, Other (Comments)  [limited mobility - she does stretches in her bed - is trying to get approved for PT in home - referral has already been sent] -- Other (Comments)  [client is  resting in bed daily, recovering from a fall] Intervention Not Indicated Other (Comments)  [client has walking challenges. Client fell recently and is resting in bed.]  Stress Interventions -- Other (Comment)  [already getting counseling - doesn't want a referral] -- Provide Counseling Other (Comment)  [related to medications and bed bound] Provide Counseling  [client has stress related tp managing medical needs]  Social Connections Interventions -- Patient Refused  [bed bound right now] -- -- -- --       CCM Care Plan  Allergies  Allergen Reactions   Gabapentin Other (See Comments)    Uncontrolled shakes / cold intolerance     Medications Reviewed Today     Reviewed by Lavera Guise, [facility] (Pharmacist) on 01/09/22 at 78  Med List Status: <None>   Medication Order Taking? Sig Documenting Provider Last Dose Status Informant  aspirin EC 81 MG tablet [id number]  Take 81 mg by mouth daily. Swallow whole. Provider, Historical, Stokes  Active   Buprenorphine HCl-Naloxone HCl (SUBOXONE) 8-2 MG FILM [id number]  Place 1 Film under the tongue in  the morning and at bedtime. Provider, Historical, Stokes  Active Self  carvedilol (COREG) 12.5 MG tablet [id number]  Take by mouth. Provider, Historical, Stokes  Active   Dulaglutide (TRULICITY) 4.5 XN/1.7GY SOPN [id number]  Inject 4.5 mg as directed once a week. Hendricks Limes F, Stokes  Active   DULoxetine (CYMBALTA) 30 MG capsule [id number]  Take 1 capsule (30 mg total) by mouth daily. Loman Brooklyn, Stokes  Active   entecavir (BARACLUDE) 1 MG tablet [id number]  Take 1 mg by mouth at bedtime. Provider, Historical, Stokes  Active Self           Med Note Wilmon Pali, MELISSA R   Thu Oct 02, 2021  3:59 PM)    Evolocumab (REPATHA SURECLICK) 793 MG/ML SOAJ [id number]  Inject 140 mg into the skin every 14 (fourteen) days. Loman Brooklyn, Stokes  Active Self  lisinopril (ZESTRIL) 20 MG tablet [id number]  Take 1 tablet (20 mg total) by mouth daily.  Patient taking differently: Take 20 mg by mouth at bedtime.   Loman Brooklyn, Stokes  Active Self  mirtazapine (REMERON) 15 MG tablet [id number]  Take 1 tablet (15 mg total) by mouth at bedtime. Hendricks Limes F, Stokes  Active Self  Vitamin D, Ergocalciferol, (DRISDOL) 1.25 MG (50000 UNIT) CAPS capsule [id number]  Take 1 capsule (50,000 Units total) by mouth every Sunday. Loman Brooklyn, Stokes  Active   Med List Note Lavera Guise [facility] 09/17/21 6811): Trulicity VIA [facility] PATIENT ASSISTANCE escribe to [facility]            Patient Active Problem List   Diagnosis Date Noted   Vitamin D deficiency 08/31/2021   Vitamin A deficiency 08/31/2021   Constipation 08/11/2021   Dysphagia 08/11/2021   Secondary esophageal varices without bleeding ([place]) 06/24/2021   Malnutrition of moderate degree 05/17/2021   Chronic pain 05/15/2021   CAD (coronary artery disease) 05/15/2021   Discitis of thoracic region 05/15/2021   DNR (Stokes not resuscitate) 05/15/2021   Pressure injury of left buttock, stage 2 ([place]) 05/11/2021   Aortic atherosclerosis ([place]) 02/18/2021   Moderate  episode of recurrent major depressive disorder ([place]) 02/18/2021   Tachycardia 02/18/2021   Statins contraindicated 01/16/2021   Cirrhosis of liver without ascites ([place]) 10/23/2020   History of drug abuse ([place]) 10/23/2020   Mixed hyperlipidemia 04/18/2019   Other pancytopenia ([place]) 03/02/2019   Insomnia due to  medical condition 09/22/2018   Chronic active viral hepatitis B ([place]) 01/26/2017   Uncontrolled type 2 diabetes mellitus with hyperglycemia, without long-term current use of insulin ([place]) 01/13/2017   Essential hypertension 12/17/2016   Heart murmur, systolic 08/65/7846   Smoker 08/26/2016   Idiopathic scoliosis 01/28/2016   Cervical spondylosis with myelopathy 07/08/2013    Immunization History  Administered Date(s) Administered   Fluad Quad(high Dose 65+) 01/16/2021   Influenza,inj,Quad PF,6+ Mos 01/13/2017, 03/15/2018, 12/23/2018   Moderna Sars-Covid-2 Vaccination 10/14/2019, 11/11/2019   PNEUMOCOCCAL CONJUGATE-20 05/08/2021   Tdap 05/08/2021   Zoster Recombinat (Shingrix) 08/27/2021, 12/18/2021    Conditions to be addressed/monitored: HLD and DMII  Care Plan : PHARMD MEDICATION MANAGEMENT  Updates made by Lavera Guise, [place] since 01/20/2022 12:00 AM     Problem: DISEASE PROGRESSION PREVENTION      Long-Range Goal: T2DM, HLD   Recent Progress: Not on track  Priority: High  Note:   Current Barriers:  Unable to independently afford treatment regimen Unable to achieve control of T2DM, HLD  Suboptimal therapeutic regimen for T2DM, HLD  Pharmacist Clinical Goal(s):  patient will verbalize ability to afford treatment regimen achieve control of T2DM, HLD as evidenced by IMPROVED GLYCEMIC CONTROL  through collaboration with PharmD and provider.   Interventions: 1:1 collaboration with Loman Brooklyn, Stokes regarding development and update of comprehensive plan of care as evidenced by provider attestation and co-signature Inter-disciplinary care team collaboration  (see longitudinal plan of care) Comprehensive medication review performed; medication list updated in electronic medical record  Diabetes: Goal on Track (progressing): YES. Uncontrolled--A1C 12.4-->9.3%; current treatment: TRULICITY 67m WEEKLY, will plan to add SGLT2 when we apply for assistance;  CAN'T AFFORD; ENROLLED IN PATIENT ASSISTANCE [place]IA [place] order for [facility]) Denies personal and family history of Medullary thyroid cancer (MTC) PATIENT USES ACCUCHECK GUIDE GLUCOMETER WILL LIKELY HAVE TO ADD ANOTHER MEDICATION AT F/U Current glucose readings: fasting glucose: N/A, post prandial glucose: N/A Discussed meal planning options and Plate method for healthy eating Avoid sugary drinks and desserts Incorporate balanced protein, non starchy veggies, 1 serving of carbohydrate with each meal Increase water intake Increase physical activity as able Current exercise: N/A--WALKS WITH WALKER; UNABLE TO PARTICIPATE IN EXERCISE Counseled on TRULICITY, GLUCOMETER Recommended TRULICITY, WILL ADD ADDITIONAL MEDICATION AT F/U Assessed patient finances. ENROLLED IN THE [facility] PATIENT ASSISTANCE PROGRAM Hyperlipidemia:  New goal. Uncontrolled; current treatment: N/A DUE TO LIVER;  CONSIDER NON-STATIN ALTERNATIVE-->REPATHA WAS APPROVED--patient can't afford, [facility] IS CLOSED) Nexlizet samples given to bridge patient to a more long term solution Medications previously tried: N/A  Current dietary patterns: RECOMMENDED HEART HEALTHY DIET  Lipid Panel     Component Value Date/Time   CHOL 168 12/18/2021 1048   TRIG 139 12/18/2021 1048   HDL 38 (L) 12/18/2021 1048   CHOLHDL 4.4 12/18/2021 1048   LDLCALC 105 (H) 12/18/2021 1048   LABVLDL 25 12/18/2021 1048    Patient Goals/Self-Care Activities patient will:  - take medications as prescribed as evidenced by patient report and record review check glucose DAILY FASTING, document, and  provide at future appointments collaborate with provider on medication access solutions engage in dietary modifications by FEkronDattero Khylie Larmore, PharmD, BCPS Clinical Pharmacist, [place] Phone [phone]

## 2022-01-12 ENCOUNTER — Ambulatory Visit (INDEPENDENT_AMBULATORY_CARE_PROVIDER_SITE_OTHER): Payer: Medicare HMO

## 2022-01-12 DIAGNOSIS — B1920 Unspecified viral hepatitis C without hepatic coma: Secondary | ICD-10-CM

## 2022-01-12 DIAGNOSIS — I2510 Atherosclerotic heart disease of native coronary artery without angina pectoris: Secondary | ICD-10-CM | POA: Diagnosis not present

## 2022-01-12 DIAGNOSIS — G4701 Insomnia due to medical condition: Secondary | ICD-10-CM

## 2022-01-12 DIAGNOSIS — E1165 Type 2 diabetes mellitus with hyperglycemia: Secondary | ICD-10-CM

## 2022-01-12 DIAGNOSIS — E1121 Type 2 diabetes mellitus with diabetic nephropathy: Secondary | ICD-10-CM

## 2022-01-12 DIAGNOSIS — I10 Essential (primary) hypertension: Secondary | ICD-10-CM

## 2022-01-12 DIAGNOSIS — M4624 Osteomyelitis of vertebra, thoracic region: Secondary | ICD-10-CM | POA: Diagnosis not present

## 2022-01-12 DIAGNOSIS — E782 Mixed hyperlipidemia: Secondary | ICD-10-CM

## 2022-01-12 DIAGNOSIS — F331 Major depressive disorder, recurrent, moderate: Secondary | ICD-10-CM

## 2022-01-12 DIAGNOSIS — M5441 Lumbago with sciatica, right side: Secondary | ICD-10-CM

## 2022-01-12 DIAGNOSIS — E1140 Type 2 diabetes mellitus with diabetic neuropathy, unspecified: Secondary | ICD-10-CM | POA: Diagnosis not present

## 2022-01-12 DIAGNOSIS — E440 Moderate protein-calorie malnutrition: Secondary | ICD-10-CM | POA: Diagnosis not present

## 2022-01-12 DIAGNOSIS — R000 Tachycardia, unspecified: Secondary | ICD-10-CM

## 2022-01-12 DIAGNOSIS — I700 Atherosclerosis of aorta: Secondary | ICD-10-CM | POA: Diagnosis not present

## 2022-01-12 DIAGNOSIS — G8929 Other chronic pain: Secondary | ICD-10-CM

## 2022-01-12 DIAGNOSIS — D61818 Other pancytopenia: Secondary | ICD-10-CM

## 2022-01-12 DIAGNOSIS — K7460 Unspecified cirrhosis of liver: Secondary | ICD-10-CM

## 2022-01-21 ENCOUNTER — Telehealth: Payer: Self-pay | Admitting: *Deleted

## 2022-01-21 NOTE — Telephone Encounter (Signed)
FYI: TC from [place] PT w/ [facility] He is at pt's home, pt had a fall about an hour ago on her left hip, pain is 6/10 doesn't want to go to ED to be checked out/get x-ray. Grace Stokes did an evaluation did not seem like it was broken but told her it would be best to get an x-ray Pt agreed to appt w/ PCP tomorrow to be checked out.

## 2022-01-22 ENCOUNTER — Ambulatory Visit (INDEPENDENT_AMBULATORY_CARE_PROVIDER_SITE_OTHER): Payer: Medicare HMO

## 2022-01-22 ENCOUNTER — Ambulatory Visit (INDEPENDENT_AMBULATORY_CARE_PROVIDER_SITE_OTHER): Payer: Medicare HMO | Admitting: Family Medicine

## 2022-01-22 ENCOUNTER — Encounter: Payer: Self-pay | Admitting: Family Medicine

## 2022-01-22 VITALS — BP 145/81 | HR 99 | Temp 98.9°F | Ht <= 58 in

## 2022-01-22 DIAGNOSIS — M25552 Pain in left hip: Secondary | ICD-10-CM | POA: Diagnosis not present

## 2022-01-22 DIAGNOSIS — K7460 Unspecified cirrhosis of liver: Secondary | ICD-10-CM

## 2022-01-22 DIAGNOSIS — W19XXXA Unspecified fall, initial encounter: Secondary | ICD-10-CM | POA: Diagnosis not present

## 2022-01-22 DIAGNOSIS — L03032 Cellulitis of left toe: Secondary | ICD-10-CM | POA: Diagnosis not present

## 2022-01-22 DIAGNOSIS — M25512 Pain in left shoulder: Secondary | ICD-10-CM | POA: Diagnosis not present

## 2022-01-22 DIAGNOSIS — D696 Thrombocytopenia, unspecified: Secondary | ICD-10-CM

## 2022-01-22 DIAGNOSIS — K5909 Other constipation: Secondary | ICD-10-CM

## 2022-01-22 MED ORDER — CEPHALEXIN 500 MG PO CAPS: 500.0000 mg | ORAL_CAPSULE | Freq: Three times a day (TID) | ORAL | 0 refills | Status: AC

## 2022-01-22 NOTE — Patient Instructions (Addendum)
Miralax 1 capful twice a day for constipation until daily bowel movement, then take once a day.   Cephalexin (antibiotic) three times a day for toe infection.   Epsom salt soaks in lukewarm water 3x a day for toe.

## 2022-01-22 NOTE — Progress Notes (Signed)
Acute Office Visit  Subjective:     Patient ID: Grace Stokes, female    DOB: November 26, 1955, 66 y.o.   MRN: [id number]  Chief Complaint  Patient presents with   Fall    HPI Here with husband today. Patient is in today for a fall.   She fell yesterday when getting dressed. She landed on her left hip and her left shoulder. The pain is achy and moderate. The fire deparmtent came and helped her up. She declined ER evaluation. She takes suboxone daily. She is not able to take NSAIDs due to low platelets and CAD. She has to limit tyleno due to cirrhosis.    She also reports erythema and tenderness around her left great toe. She has thickened, yellow nails. She is established with podiatry but it is difficult for her to get to appointments.   She also struggles with constipation chronically. Last BM was yesterday but it was very small and hard. She has some intermittent lower abdominal pain. She reports drinking plenty of water. She does not eat a lot of fiber. She occasionally uses an enema but does not take anything daily. She denies nausea, vomiting, fever, or chills.   ROS As per HPI.      Objective:    BP (!) 145/81   Pulse 99   Temp 98.9 F (37.2 C) (Oral)   Ht '4\' 10"'$  (1.473 m)   LMP 04/06/2011   SpO2 95%   BMI 26.13 kg/m    Physical Exam Vitals and nursing note reviewed.  Cardiovascular:     Rate and Rhythm: Normal rate and regular rhythm.     Heart sounds: Normal heart sounds. No murmur heard. Pulmonary:     Effort: Pulmonary effort is normal. No respiratory distress.     Breath sounds: Normal breath sounds.  Abdominal:     General: Bowel sounds are normal. There is no distension.     Palpations: Abdomen is soft.     Tenderness: There is no abdominal tenderness. There is no guarding or rebound.  Musculoskeletal:     Left shoulder: Tenderness (gerneralized) present. No swelling, effusion or bony tenderness.     Left hip: Tenderness present.     Right lower  leg: No edema.     Left lower leg: No edema.     Comments: Difficult to assess hip due to decreased mobility.   Feet:     Comments: Tenderness, erythema, and mild swelling around nailbed of left great toe. No exudate.  Skin:    General: Skin is warm and dry.  Neurological:     Mental Status: She is alert. Mental status is at baseline.     Gait: Gait abnormal (arrives in wheelchair).  Psychiatric:        Mood and Affect: Mood normal.        Behavior: Behavior normal.     No results found for any visits on 01/22/22.      Assessment & Plan:   Jovan was seen today for fall.  Diagnoses and all orders for this visit:  Fall, initial encounter Acute pain of left shoulder Left hip pain Cirrhosis of liver without ascites, unspecified hepatic cirrhosis type ([place]) Thrombocytopenia ([place]) Negative Xrays today. She is on suboxone treatment. NSAIDs are contraindicated due to thrombocytopenia. Plateletes were stable at 64 on most recent labs. Discussed to use tylenol sparingly due to cirrhosis althought most recently liver enzymes were normal.  -     DG Shoulder Left; Future -  DG HIP UNILAT W OR W/O PELVIS 2-3 VIEWS LEFT; Future  Infection of nail bed of toe of left foot Keflex as below. Discussed epsom salt soaks. If no improvement, will refer to podiatry.  -     cephALEXin (KEFLEX) 500 MG capsule; Take 1 capsule (500 mg total) by mouth 3 (three) times daily for 7 days.  Chronic constipation Discussed miralax regimen, fiber, and water intake.    Return if symptoms worsen or fail to improve. Keep scheduled chronic follow up.   The patient indicates understanding of these issues and agrees with the plan.  Gwenlyn Perking, FNP

## 2022-01-29 DIAGNOSIS — G959 Disease of spinal cord, unspecified: Secondary | ICD-10-CM | POA: Diagnosis not present

## 2022-01-29 DIAGNOSIS — F1120 Opioid dependence, uncomplicated: Secondary | ICD-10-CM | POA: Diagnosis not present

## 2022-01-29 DIAGNOSIS — R7309 Other abnormal glucose: Secondary | ICD-10-CM | POA: Diagnosis not present

## 2022-01-29 DIAGNOSIS — Z1211 Encounter for screening for malignant neoplasm of colon: Secondary | ICD-10-CM | POA: Diagnosis not present

## 2022-01-29 DIAGNOSIS — R030 Elevated blood-pressure reading, without diagnosis of hypertension: Secondary | ICD-10-CM | POA: Diagnosis not present

## 2022-01-29 DIAGNOSIS — Z79899 Other long term (current) drug therapy: Secondary | ICD-10-CM | POA: Diagnosis not present

## 2022-02-02 DIAGNOSIS — Z79899 Other long term (current) drug therapy: Secondary | ICD-10-CM | POA: Diagnosis not present

## 2022-02-03 ENCOUNTER — Ambulatory Visit: Payer: Self-pay | Admitting: *Deleted

## 2022-02-03 NOTE — Patient Outreach (Signed)
  Care Coordination   02/03/2022 Name: Grace Stokes MRN: [id number] DOB: Jul 09, 1955   Care Coordination Outreach Attempts:  A second unsuccessful outreach was attempted today to offer the patient with information about available care coordination services as a benefit of their health plan.     Follow Up Plan:  Additional outreach attempts will be made to offer the patient care coordination information and services.   Encounter Outcome:  No Answer  Care Coordination Interventions Activated:  No   Care Coordination Interventions:  No, not indicated     Coretha [place] L. Lavina Hamman, RN, BSN, [place] Coordinator Office number [phone]

## 2022-02-05 ENCOUNTER — Ambulatory Visit: Payer: Medicare HMO

## 2022-02-06 ENCOUNTER — Telehealth: Payer: Self-pay | Admitting: Family Medicine

## 2022-02-06 ENCOUNTER — Encounter: Payer: Self-pay | Admitting: Family

## 2022-02-06 ENCOUNTER — Ambulatory Visit (INDEPENDENT_AMBULATORY_CARE_PROVIDER_SITE_OTHER): Payer: Medicare HMO | Admitting: Family

## 2022-02-06 DIAGNOSIS — R399 Unspecified symptoms and signs involving the genitourinary system: Secondary | ICD-10-CM

## 2022-02-06 LAB — URINALYSIS, COMPLETE
Bilirubin, UA: NEGATIVE
Glucose, UA: NEGATIVE
Ketones, UA: NEGATIVE
Nitrite, UA: NEGATIVE
Specific Gravity, UA: 1.01 (ref 1.005–1.030)
Urobilinogen, Ur: 1 mg/dL (ref 0.2–1.0)
pH, UA: 7 (ref 5.0–7.5)

## 2022-02-06 LAB — MICROSCOPIC EXAMINATION

## 2022-02-06 MED ORDER — CEPHALEXIN 500 MG PO CAPS: 500.0000 mg | ORAL_CAPSULE | Freq: Two times a day (BID) | ORAL | 0 refills | Status: DC

## 2022-02-06 NOTE — Patient Instructions (Signed)
Urinary Tract Infection, Adult  A urinary tract infection (UTI) is an infection of any part of the urinary tract. The urinary tract includes the kidneys, ureters, bladder, and urethra. These organs make, store, and get rid of urine in the body. An upper UTI affects the ureters and kidneys. A lower UTI affects the bladder and urethra. What are the causes? Most urinary tract infections are caused by bacteria in your genital area around your urethra, where urine leaves your body. These bacteria grow and cause inflammation of your urinary tract. What increases the risk? You are more likely to develop this condition if: You have a urinary catheter that stays in place. You are not able to control when you urinate or have a bowel movement (incontinence). You are female and you: Use a spermicide or diaphragm for birth control. Have low estrogen levels. Are pregnant. You have certain genes that increase your risk. You are sexually active. You take antibiotic medicines. You have a condition that causes your flow of urine to slow down, such as: An enlarged prostate, if you are female. Blockage in your urethra. A kidney stone. A nerve condition that affects your bladder control (neurogenic bladder). Not getting enough to drink, or not urinating often. You have certain medical conditions, such as: Diabetes. A weak disease-fighting system (immunesystem). Sickle cell disease. Gout. Spinal cord injury. What are the signs or symptoms? Symptoms of this condition include: Needing to urinate right away (urgency). Frequent urination. This may include small amounts of urine each time you urinate. Pain or burning with urination. Blood in the urine. Urine that smells bad or unusual. Trouble urinating. Cloudy urine. Vaginal discharge, if you are female. Pain in the abdomen or the lower back. You may also have: Vomiting or a decreased appetite. Confusion. Irritability or tiredness. A fever or  chills. Diarrhea. The first symptom in older adults may be confusion. In some cases, they may not have any symptoms until the infection has worsened. How is this diagnosed? This condition is diagnosed based on your medical history and a physical exam. You may also have other tests, including: Urine tests. Blood tests. Tests for STIs (sexually transmitted infections). If you have had more than one UTI, a cystoscopy or imaging studies may be done to determine the cause of the infections. How is this treated? Treatment for this condition includes: Antibiotic medicine. Over-the-counter medicines to treat discomfort. Drinking enough water to stay hydrated. If you have frequent infections or have other conditions such as a kidney stone, you may need to see a health care provider who specializes in the urinary tract (urologist). In rare cases, urinary tract infections can cause sepsis. Sepsis is a life-threatening condition that occurs when the body responds to an infection. Sepsis is treated in the hospital with IV antibiotics, fluids, and other medicines. Follow these instructions at home:  Medicines Take over-the-counter and prescription medicines only as told by your health care provider. If you were prescribed an antibiotic medicine, take it as told by your health care provider. Do not stop using the antibiotic even if you start to feel better. General instructions Make sure you: Empty your bladder often and completely. Do not hold urine for long periods of time. Empty your bladder after sex. Wipe from front to back after urinating or having a bowel movement if you are female. Use each tissue only one time when you wipe. Drink enough fluid to keep your urine pale yellow. Keep all follow-up visits. This is important. Contact a health   care provider if: Your symptoms do not get better after 1-2 days. Your symptoms go away and then return. Get help right away if: You have severe pain in  your back or your lower abdomen. You have a fever or chills. You have nausea or vomiting. Summary A urinary tract infection (UTI) is an infection of any part of the urinary tract, which includes the kidneys, ureters, bladder, and urethra. Most urinary tract infections are caused by bacteria in your genital area. Treatment for this condition often includes antibiotic medicines. If you were prescribed an antibiotic medicine, take it as told by your health care provider. Do not stop using the antibiotic even if you start to feel better. Keep all follow-up visits. This is important. This information is not intended to replace advice given to you by your health care provider. Make sure you discuss any questions you have with your health care provider. Document Revised: 11/10/2019 Document Reviewed: 11/10/2019 Elsevier Patient Education  2023 Elsevier Inc.  

## 2022-02-06 NOTE — Progress Notes (Signed)
   Virtual Visit  Note Due to COVID-19 pandemic this visit was conducted virtually. This visit type was conducted due to national recommendations for restrictions regarding the COVID-19 Pandemic (e.g. social distancing, sheltering in place) in an effort to limit this patient's exposure and mitigate transmission in our community. All issues noted in this document were discussed and addressed.  A physical exam was not performed with this format.  I connected with Grace Stokes on 02/06/22 at 8:11 AM  by telephone and verified that I am speaking with the correct person using two identifiers. Grace Stokes is currently located at home and no one is currently with her during visit. The provider, Evelina Dun, FNP is located in their office at time of visit.  I discussed the limitations, risks, security and privacy concerns of performing an evaluation and management service by telephone and the availability of in person appointments. I also discussed with the patient that there may be a patient responsible charge related to this service. The patient expressed understanding and agreed to proceed.   History and Present Illness:  Pt calls the office today with dysuria that started three day ago.  Dysuria  This is a new problem. The current episode started in the past 7 days. The problem occurs intermittently. The problem has been gradually worsening. The quality of the pain is described as burning. The pain is at a severity of 10/10. The pain is mild. The maximum temperature recorded prior to her arrival was 100 - 100.9 F (99). Associated symptoms include flank pain, frequency and urgency. Pertinent negatives include no discharge, hematuria, nausea, possible pregnancy or vomiting. She has tried increased fluids for the symptoms. The treatment provided mild relief.      Review of Systems  Gastrointestinal:  Negative for nausea and vomiting.  Genitourinary:  Positive for dysuria, flank pain,  frequency and urgency. Negative for hematuria.     Observations/Objective: No SOB or distress noted   Assessment and Plan: 1. UTI symptoms Force fluids AZO over the counter X2 days RTO if symptoms worsen or do not improve  Culture pending - Urinalysis, Complete - Urine Culture - cephALEXin (KEFLEX) 500 MG capsule; Take 1 capsule (500 mg total) by mouth 2 (two) times daily.  Dispense: 14 capsule; Refill: 0      I discussed the assessment and treatment plan with the patient. The patient was provided an opportunity to ask questions and all were answered. The patient agreed with the plan and demonstrated an understanding of the instructions.   The patient was advised to call back or seek an in-person evaluation if the symptoms worsen or if the condition fails to improve as anticipated.  The above assessment and management plan was discussed with the patient. The patient verbalized understanding of and has agreed to the management plan. Patient is aware to call the clinic if symptoms persist or worsen. Patient is aware when to return to the clinic for a follow-up visit. Patient educated on when it is appropriate to go to the emergency department.   Time call ended:  8:22 AM   I provided 11 minutes of  non face-to-face time during this encounter.    Evelina Dun, FNP

## 2022-02-06 NOTE — Telephone Encounter (Signed)
Left message informing pt that urine culture is still pending. Advised if Grace Stokes started her on any medications to start them and that based off of the UA it does not look like a clear infection. Informed that we will call and make aware once culture results. Also advised to call back if needed.

## 2022-02-09 ENCOUNTER — Telehealth: Payer: Self-pay | Admitting: Family Medicine

## 2022-02-09 LAB — URINE CULTURE

## 2022-02-09 NOTE — Telephone Encounter (Signed)
Patient calling about urine from 10/27. Please call back and advise.

## 2022-02-09 NOTE — Telephone Encounter (Signed)
TC w/ [name] PT from [facility] pt now, husband who is her main caregiver, had CVA on Thursday and is not there. She has not been eating, her vitals are stable. Has not been able to participate in PT Called this morning for UTI results, she is in a lot of pain, urinating, abdominal and back pain. Please advise and [name] & I suggested to her in using [facility] so they can delivery to her since she is not able to get out of the house

## 2022-02-09 NOTE — Telephone Encounter (Signed)
Pt aware of provider feedback and pt was not aware that antibiotic was sent in on 02/06/22 so she will send someone to pick it up for her to start taking and I did tell pt urine culture has not resulted yet.

## 2022-02-10 DIAGNOSIS — K766 Portal hypertension: Secondary | ICD-10-CM | POA: Diagnosis not present

## 2022-02-10 DIAGNOSIS — M79652 Pain in left thigh: Secondary | ICD-10-CM | POA: Diagnosis not present

## 2022-02-10 DIAGNOSIS — M25552 Pain in left hip: Secondary | ICD-10-CM | POA: Diagnosis not present

## 2022-02-10 DIAGNOSIS — R54 Age-related physical debility: Secondary | ICD-10-CM | POA: Diagnosis not present

## 2022-02-10 DIAGNOSIS — E876 Hypokalemia: Secondary | ICD-10-CM | POA: Diagnosis not present

## 2022-02-10 DIAGNOSIS — N1330 Unspecified hydronephrosis: Secondary | ICD-10-CM | POA: Diagnosis not present

## 2022-02-10 DIAGNOSIS — G8929 Other chronic pain: Secondary | ICD-10-CM | POA: Diagnosis not present

## 2022-02-10 DIAGNOSIS — M549 Dorsalgia, unspecified: Secondary | ICD-10-CM | POA: Diagnosis not present

## 2022-02-10 DIAGNOSIS — N320 Bladder-neck obstruction: Secondary | ICD-10-CM | POA: Diagnosis not present

## 2022-02-10 DIAGNOSIS — M79651 Pain in right thigh: Secondary | ICD-10-CM | POA: Diagnosis not present

## 2022-02-10 DIAGNOSIS — M5489 Other dorsalgia: Secondary | ICD-10-CM | POA: Diagnosis not present

## 2022-02-10 DIAGNOSIS — N134 Hydroureter: Secondary | ICD-10-CM | POA: Diagnosis not present

## 2022-02-10 DIAGNOSIS — R109 Unspecified abdominal pain: Secondary | ICD-10-CM | POA: Diagnosis not present

## 2022-02-10 DIAGNOSIS — E119 Type 2 diabetes mellitus without complications: Secondary | ICD-10-CM | POA: Diagnosis not present

## 2022-02-10 DIAGNOSIS — R1030 Lower abdominal pain, unspecified: Secondary | ICD-10-CM | POA: Diagnosis not present

## 2022-02-10 DIAGNOSIS — K7460 Unspecified cirrhosis of liver: Secondary | ICD-10-CM | POA: Diagnosis not present

## 2022-02-10 DIAGNOSIS — I10 Essential (primary) hypertension: Secondary | ICD-10-CM | POA: Diagnosis not present

## 2022-02-10 DIAGNOSIS — M25559 Pain in unspecified hip: Secondary | ICD-10-CM | POA: Diagnosis not present

## 2022-02-10 DIAGNOSIS — Z79899 Other long term (current) drug therapy: Secondary | ICD-10-CM | POA: Diagnosis not present

## 2022-02-10 DIAGNOSIS — R5381 Other malaise: Secondary | ICD-10-CM | POA: Diagnosis not present

## 2022-02-10 DIAGNOSIS — N136 Pyonephrosis: Secondary | ICD-10-CM | POA: Diagnosis not present

## 2022-02-10 DIAGNOSIS — N179 Acute kidney failure, unspecified: Secondary | ICD-10-CM | POA: Diagnosis not present

## 2022-02-10 DIAGNOSIS — I700 Atherosclerosis of aorta: Secondary | ICD-10-CM | POA: Diagnosis not present

## 2022-02-10 DIAGNOSIS — M25551 Pain in right hip: Secondary | ICD-10-CM | POA: Diagnosis not present

## 2022-02-10 DIAGNOSIS — M545 Low back pain, unspecified: Secondary | ICD-10-CM | POA: Diagnosis not present

## 2022-02-10 DIAGNOSIS — K5900 Constipation, unspecified: Secondary | ICD-10-CM | POA: Diagnosis not present

## 2022-02-10 DIAGNOSIS — R1031 Right lower quadrant pain: Secondary | ICD-10-CM | POA: Diagnosis not present

## 2022-02-10 DIAGNOSIS — W19XXXA Unspecified fall, initial encounter: Secondary | ICD-10-CM | POA: Diagnosis not present

## 2022-02-10 DIAGNOSIS — E440 Moderate protein-calorie malnutrition: Secondary | ICD-10-CM | POA: Diagnosis not present

## 2022-02-11 DIAGNOSIS — I10 Essential (primary) hypertension: Secondary | ICD-10-CM | POA: Diagnosis not present

## 2022-02-11 DIAGNOSIS — M545 Low back pain, unspecified: Secondary | ICD-10-CM | POA: Diagnosis not present

## 2022-02-11 DIAGNOSIS — M25551 Pain in right hip: Secondary | ICD-10-CM | POA: Diagnosis not present

## 2022-02-11 DIAGNOSIS — N320 Bladder-neck obstruction: Secondary | ICD-10-CM | POA: Diagnosis not present

## 2022-02-11 DIAGNOSIS — M549 Dorsalgia, unspecified: Secondary | ICD-10-CM | POA: Diagnosis not present

## 2022-02-11 DIAGNOSIS — N179 Acute kidney failure, unspecified: Secondary | ICD-10-CM | POA: Diagnosis not present

## 2022-02-11 DIAGNOSIS — K5900 Constipation, unspecified: Secondary | ICD-10-CM | POA: Diagnosis not present

## 2022-02-11 DIAGNOSIS — G8929 Other chronic pain: Secondary | ICD-10-CM | POA: Diagnosis not present

## 2022-02-11 DIAGNOSIS — E119 Type 2 diabetes mellitus without complications: Secondary | ICD-10-CM | POA: Diagnosis not present

## 2022-02-11 DIAGNOSIS — M25552 Pain in left hip: Secondary | ICD-10-CM | POA: Diagnosis not present

## 2022-02-12 DIAGNOSIS — N1330 Unspecified hydronephrosis: Secondary | ICD-10-CM | POA: Diagnosis not present

## 2022-02-13 ENCOUNTER — Ambulatory Visit: Payer: Self-pay | Admitting: *Deleted

## 2022-02-13 DIAGNOSIS — R161 Splenomegaly, not elsewhere classified: Secondary | ICD-10-CM | POA: Diagnosis not present

## 2022-02-13 DIAGNOSIS — N1330 Unspecified hydronephrosis: Secondary | ICD-10-CM | POA: Diagnosis not present

## 2022-02-13 DIAGNOSIS — Z743 Need for continuous supervision: Secondary | ICD-10-CM | POA: Diagnosis not present

## 2022-02-13 DIAGNOSIS — K7460 Unspecified cirrhosis of liver: Secondary | ICD-10-CM | POA: Diagnosis not present

## 2022-02-13 DIAGNOSIS — M25551 Pain in right hip: Secondary | ICD-10-CM | POA: Diagnosis not present

## 2022-02-13 DIAGNOSIS — E440 Moderate protein-calorie malnutrition: Secondary | ICD-10-CM | POA: Diagnosis not present

## 2022-02-13 DIAGNOSIS — K6389 Other specified diseases of intestine: Secondary | ICD-10-CM | POA: Diagnosis not present

## 2022-02-13 DIAGNOSIS — M6281 Muscle weakness (generalized): Secondary | ICD-10-CM | POA: Diagnosis not present

## 2022-02-13 DIAGNOSIS — N178 Other acute kidney failure: Secondary | ICD-10-CM | POA: Diagnosis not present

## 2022-02-13 DIAGNOSIS — K521 Toxic gastroenteritis and colitis: Secondary | ICD-10-CM | POA: Diagnosis not present

## 2022-02-13 DIAGNOSIS — F918 Other conduct disorders: Secondary | ICD-10-CM | POA: Diagnosis not present

## 2022-02-13 DIAGNOSIS — M545 Low back pain, unspecified: Secondary | ICD-10-CM | POA: Diagnosis not present

## 2022-02-13 DIAGNOSIS — N131 Hydronephrosis with ureteral stricture, not elsewhere classified: Secondary | ICD-10-CM | POA: Diagnosis not present

## 2022-02-13 DIAGNOSIS — N132 Hydronephrosis with renal and ureteral calculous obstruction: Secondary | ICD-10-CM | POA: Diagnosis not present

## 2022-02-13 DIAGNOSIS — B1910 Unspecified viral hepatitis B without hepatic coma: Secondary | ICD-10-CM | POA: Diagnosis not present

## 2022-02-13 DIAGNOSIS — E119 Type 2 diabetes mellitus without complications: Secondary | ICD-10-CM | POA: Diagnosis not present

## 2022-02-13 DIAGNOSIS — E1165 Type 2 diabetes mellitus with hyperglycemia: Secondary | ICD-10-CM | POA: Diagnosis not present

## 2022-02-13 DIAGNOSIS — I8510 Secondary esophageal varices without bleeding: Secondary | ICD-10-CM | POA: Diagnosis not present

## 2022-02-13 DIAGNOSIS — N179 Acute kidney failure, unspecified: Secondary | ICD-10-CM | POA: Diagnosis not present

## 2022-02-13 DIAGNOSIS — R2689 Other abnormalities of gait and mobility: Secondary | ICD-10-CM | POA: Diagnosis not present

## 2022-02-13 DIAGNOSIS — T68XXXA Hypothermia, initial encounter: Secondary | ICD-10-CM | POA: Diagnosis not present

## 2022-02-13 DIAGNOSIS — I959 Hypotension, unspecified: Secondary | ICD-10-CM | POA: Diagnosis not present

## 2022-02-13 DIAGNOSIS — K8020 Calculus of gallbladder without cholecystitis without obstruction: Secondary | ICD-10-CM | POA: Diagnosis not present

## 2022-02-13 DIAGNOSIS — R1312 Dysphagia, oropharyngeal phase: Secondary | ICD-10-CM | POA: Diagnosis not present

## 2022-02-13 DIAGNOSIS — R278 Other lack of coordination: Secondary | ICD-10-CM | POA: Diagnosis not present

## 2022-02-13 DIAGNOSIS — K766 Portal hypertension: Secondary | ICD-10-CM | POA: Diagnosis not present

## 2022-02-13 DIAGNOSIS — G8929 Other chronic pain: Secondary | ICD-10-CM | POA: Diagnosis not present

## 2022-02-13 DIAGNOSIS — D61818 Other pancytopenia: Secondary | ICD-10-CM | POA: Diagnosis not present

## 2022-02-13 DIAGNOSIS — R601 Generalized edema: Secondary | ICD-10-CM | POA: Diagnosis not present

## 2022-02-13 DIAGNOSIS — Z66 Do not resuscitate: Secondary | ICD-10-CM | POA: Diagnosis not present

## 2022-02-13 DIAGNOSIS — M25552 Pain in left hip: Secondary | ICD-10-CM | POA: Diagnosis not present

## 2022-02-13 DIAGNOSIS — R195 Other fecal abnormalities: Secondary | ICD-10-CM | POA: Diagnosis not present

## 2022-02-13 DIAGNOSIS — R109 Unspecified abdominal pain: Secondary | ICD-10-CM | POA: Diagnosis not present

## 2022-02-13 DIAGNOSIS — I10 Essential (primary) hypertension: Secondary | ICD-10-CM | POA: Diagnosis not present

## 2022-02-13 DIAGNOSIS — Z960 Presence of urogenital implants: Secondary | ICD-10-CM | POA: Diagnosis not present

## 2022-02-13 DIAGNOSIS — K921 Melena: Secondary | ICD-10-CM | POA: Diagnosis not present

## 2022-02-13 DIAGNOSIS — R188 Other ascites: Secondary | ICD-10-CM | POA: Diagnosis not present

## 2022-02-13 DIAGNOSIS — G894 Chronic pain syndrome: Secondary | ICD-10-CM | POA: Diagnosis not present

## 2022-02-13 NOTE — Patient Outreach (Signed)
  Care Coordination   02/13/2022 Name: Grace Stokes MRN: [id number] DOB: 10/01/1955   Care Coordination Outreach Attempts:  A third unsuccessful outreach was attempted today to offer the patient with information about available care coordination services as a benefit of their health plan.   Follow Up Plan:  No further outreach attempts will be made at this time. We have been unable to contact the patient to offer or enroll patient in care coordination services  Encounter Outcome:  No Answer  Care Coordination Interventions Activated:  No   Care Coordination Interventions:  No, not indicated    Augustine Brannick L. Lavina Hamman, RN, BSN, [place] Coordinator Office number [phone]

## 2022-02-14 DIAGNOSIS — N179 Acute kidney failure, unspecified: Secondary | ICD-10-CM | POA: Diagnosis not present

## 2022-02-14 DIAGNOSIS — I10 Essential (primary) hypertension: Secondary | ICD-10-CM | POA: Diagnosis not present

## 2022-02-14 DIAGNOSIS — R195 Other fecal abnormalities: Secondary | ICD-10-CM | POA: Diagnosis not present

## 2022-02-14 DIAGNOSIS — R188 Other ascites: Secondary | ICD-10-CM | POA: Diagnosis not present

## 2022-02-14 DIAGNOSIS — K7460 Unspecified cirrhosis of liver: Secondary | ICD-10-CM | POA: Diagnosis not present

## 2022-02-14 DIAGNOSIS — D61818 Other pancytopenia: Secondary | ICD-10-CM | POA: Diagnosis not present

## 2022-02-14 DIAGNOSIS — N1330 Unspecified hydronephrosis: Secondary | ICD-10-CM | POA: Diagnosis not present

## 2022-02-14 DIAGNOSIS — G894 Chronic pain syndrome: Secondary | ICD-10-CM | POA: Diagnosis not present

## 2022-02-14 DIAGNOSIS — E1165 Type 2 diabetes mellitus with hyperglycemia: Secondary | ICD-10-CM | POA: Diagnosis not present

## 2022-02-15 DIAGNOSIS — R188 Other ascites: Secondary | ICD-10-CM | POA: Diagnosis not present

## 2022-02-15 DIAGNOSIS — N1330 Unspecified hydronephrosis: Secondary | ICD-10-CM | POA: Diagnosis not present

## 2022-02-15 DIAGNOSIS — D61818 Other pancytopenia: Secondary | ICD-10-CM | POA: Diagnosis not present

## 2022-02-15 DIAGNOSIS — N179 Acute kidney failure, unspecified: Secondary | ICD-10-CM | POA: Diagnosis not present

## 2022-02-15 DIAGNOSIS — G894 Chronic pain syndrome: Secondary | ICD-10-CM | POA: Diagnosis not present

## 2022-02-15 DIAGNOSIS — E1165 Type 2 diabetes mellitus with hyperglycemia: Secondary | ICD-10-CM | POA: Diagnosis not present

## 2022-02-15 DIAGNOSIS — K7460 Unspecified cirrhosis of liver: Secondary | ICD-10-CM | POA: Diagnosis not present

## 2022-02-15 DIAGNOSIS — I10 Essential (primary) hypertension: Secondary | ICD-10-CM | POA: Diagnosis not present

## 2022-02-16 DIAGNOSIS — E1165 Type 2 diabetes mellitus with hyperglycemia: Secondary | ICD-10-CM | POA: Diagnosis not present

## 2022-02-16 DIAGNOSIS — N1330 Unspecified hydronephrosis: Secondary | ICD-10-CM | POA: Diagnosis not present

## 2022-02-16 DIAGNOSIS — I10 Essential (primary) hypertension: Secondary | ICD-10-CM | POA: Diagnosis not present

## 2022-02-16 DIAGNOSIS — R188 Other ascites: Secondary | ICD-10-CM | POA: Diagnosis not present

## 2022-02-16 DIAGNOSIS — K7460 Unspecified cirrhosis of liver: Secondary | ICD-10-CM | POA: Diagnosis not present

## 2022-02-16 DIAGNOSIS — N179 Acute kidney failure, unspecified: Secondary | ICD-10-CM | POA: Diagnosis not present

## 2022-02-16 DIAGNOSIS — D61818 Other pancytopenia: Secondary | ICD-10-CM | POA: Diagnosis not present

## 2022-02-16 DIAGNOSIS — G894 Chronic pain syndrome: Secondary | ICD-10-CM | POA: Diagnosis not present

## 2022-02-17 DIAGNOSIS — N179 Acute kidney failure, unspecified: Secondary | ICD-10-CM | POA: Diagnosis not present

## 2022-02-21 DIAGNOSIS — E119 Type 2 diabetes mellitus without complications: Secondary | ICD-10-CM | POA: Diagnosis not present

## 2022-02-21 DIAGNOSIS — K7460 Unspecified cirrhosis of liver: Secondary | ICD-10-CM | POA: Diagnosis not present

## 2022-02-21 DIAGNOSIS — G894 Chronic pain syndrome: Secondary | ICD-10-CM | POA: Diagnosis not present

## 2022-02-21 DIAGNOSIS — R799 Abnormal finding of blood chemistry, unspecified: Secondary | ICD-10-CM | POA: Diagnosis not present

## 2022-02-21 DIAGNOSIS — F1111 Opioid abuse, in remission: Secondary | ICD-10-CM | POA: Diagnosis not present

## 2022-02-21 DIAGNOSIS — R278 Other lack of coordination: Secondary | ICD-10-CM | POA: Diagnosis not present

## 2022-02-21 DIAGNOSIS — E876 Hypokalemia: Secondary | ICD-10-CM | POA: Diagnosis not present

## 2022-02-21 DIAGNOSIS — F1190 Opioid use, unspecified, uncomplicated: Secondary | ICD-10-CM | POA: Diagnosis not present

## 2022-02-21 DIAGNOSIS — F918 Other conduct disorders: Secondary | ICD-10-CM | POA: Diagnosis not present

## 2022-02-21 DIAGNOSIS — J9811 Atelectasis: Secondary | ICD-10-CM | POA: Diagnosis not present

## 2022-02-21 DIAGNOSIS — R2689 Other abnormalities of gait and mobility: Secondary | ICD-10-CM | POA: Diagnosis not present

## 2022-02-21 DIAGNOSIS — R319 Hematuria, unspecified: Secondary | ICD-10-CM | POA: Diagnosis not present

## 2022-02-21 DIAGNOSIS — R739 Hyperglycemia, unspecified: Secondary | ICD-10-CM | POA: Diagnosis not present

## 2022-02-21 DIAGNOSIS — D61818 Other pancytopenia: Secondary | ICD-10-CM | POA: Diagnosis not present

## 2022-02-21 DIAGNOSIS — M6281 Muscle weakness (generalized): Secondary | ICD-10-CM | POA: Diagnosis not present

## 2022-02-21 DIAGNOSIS — M11251 Other chondrocalcinosis, right hip: Secondary | ICD-10-CM | POA: Diagnosis not present

## 2022-02-21 DIAGNOSIS — N3001 Acute cystitis with hematuria: Secondary | ICD-10-CM | POA: Diagnosis not present

## 2022-02-21 DIAGNOSIS — I10 Essential (primary) hypertension: Secondary | ICD-10-CM | POA: Diagnosis not present

## 2022-02-21 DIAGNOSIS — Z743 Need for continuous supervision: Secondary | ICD-10-CM | POA: Diagnosis not present

## 2022-02-21 DIAGNOSIS — K7682 Hepatic encephalopathy: Secondary | ICD-10-CM | POA: Diagnosis not present

## 2022-02-21 DIAGNOSIS — N1330 Unspecified hydronephrosis: Secondary | ICD-10-CM | POA: Diagnosis not present

## 2022-02-21 DIAGNOSIS — N179 Acute kidney failure, unspecified: Secondary | ICD-10-CM | POA: Diagnosis not present

## 2022-02-21 DIAGNOSIS — K6289 Other specified diseases of anus and rectum: Secondary | ICD-10-CM | POA: Diagnosis not present

## 2022-02-21 DIAGNOSIS — N390 Urinary tract infection, site not specified: Secondary | ICD-10-CM | POA: Diagnosis not present

## 2022-02-21 DIAGNOSIS — I480 Paroxysmal atrial fibrillation: Secondary | ICD-10-CM | POA: Diagnosis not present

## 2022-02-21 DIAGNOSIS — K8020 Calculus of gallbladder without cholecystitis without obstruction: Secondary | ICD-10-CM | POA: Diagnosis not present

## 2022-02-21 DIAGNOSIS — R188 Other ascites: Secondary | ICD-10-CM | POA: Diagnosis not present

## 2022-02-21 DIAGNOSIS — N170 Acute kidney failure with tubular necrosis: Secondary | ICD-10-CM | POA: Diagnosis not present

## 2022-02-21 DIAGNOSIS — K7031 Alcoholic cirrhosis of liver with ascites: Secondary | ICD-10-CM | POA: Diagnosis not present

## 2022-02-21 DIAGNOSIS — N178 Other acute kidney failure: Secondary | ICD-10-CM | POA: Diagnosis not present

## 2022-02-21 DIAGNOSIS — D696 Thrombocytopenia, unspecified: Secondary | ICD-10-CM | POA: Diagnosis not present

## 2022-02-21 DIAGNOSIS — N139 Obstructive and reflux uropathy, unspecified: Secondary | ICD-10-CM | POA: Diagnosis not present

## 2022-02-21 DIAGNOSIS — E1165 Type 2 diabetes mellitus with hyperglycemia: Secondary | ICD-10-CM | POA: Diagnosis not present

## 2022-02-21 DIAGNOSIS — R1312 Dysphagia, oropharyngeal phase: Secondary | ICD-10-CM | POA: Diagnosis not present

## 2022-02-21 DIAGNOSIS — N132 Hydronephrosis with renal and ureteral calculous obstruction: Secondary | ICD-10-CM | POA: Diagnosis not present

## 2022-02-21 DIAGNOSIS — M11259 Other chondrocalcinosis, unspecified hip: Secondary | ICD-10-CM | POA: Diagnosis not present

## 2022-02-23 DIAGNOSIS — N179 Acute kidney failure, unspecified: Secondary | ICD-10-CM | POA: Diagnosis not present

## 2022-02-23 DIAGNOSIS — K7460 Unspecified cirrhosis of liver: Secondary | ICD-10-CM | POA: Diagnosis not present

## 2022-02-23 DIAGNOSIS — M11251 Other chondrocalcinosis, right hip: Secondary | ICD-10-CM | POA: Diagnosis not present

## 2022-02-23 DIAGNOSIS — F1190 Opioid use, unspecified, uncomplicated: Secondary | ICD-10-CM | POA: Diagnosis not present

## 2022-02-23 DIAGNOSIS — G894 Chronic pain syndrome: Secondary | ICD-10-CM | POA: Diagnosis not present

## 2022-02-23 DIAGNOSIS — E876 Hypokalemia: Secondary | ICD-10-CM | POA: Diagnosis not present

## 2022-02-23 DIAGNOSIS — E119 Type 2 diabetes mellitus without complications: Secondary | ICD-10-CM | POA: Diagnosis not present

## 2022-02-23 DIAGNOSIS — I480 Paroxysmal atrial fibrillation: Secondary | ICD-10-CM | POA: Diagnosis not present

## 2022-02-25 DIAGNOSIS — N390 Urinary tract infection, site not specified: Secondary | ICD-10-CM | POA: Diagnosis not present

## 2022-02-25 DIAGNOSIS — N179 Acute kidney failure, unspecified: Secondary | ICD-10-CM | POA: Diagnosis not present

## 2022-02-25 DIAGNOSIS — E119 Type 2 diabetes mellitus without complications: Secondary | ICD-10-CM | POA: Diagnosis not present

## 2022-02-25 DIAGNOSIS — I480 Paroxysmal atrial fibrillation: Secondary | ICD-10-CM | POA: Diagnosis not present

## 2022-02-25 DIAGNOSIS — E876 Hypokalemia: Secondary | ICD-10-CM | POA: Diagnosis not present

## 2022-02-25 DIAGNOSIS — I10 Essential (primary) hypertension: Secondary | ICD-10-CM | POA: Diagnosis not present

## 2022-02-25 DIAGNOSIS — R319 Hematuria, unspecified: Secondary | ICD-10-CM | POA: Diagnosis not present

## 2022-02-25 DIAGNOSIS — K7460 Unspecified cirrhosis of liver: Secondary | ICD-10-CM | POA: Diagnosis not present

## 2022-02-25 DIAGNOSIS — F1111 Opioid abuse, in remission: Secondary | ICD-10-CM | POA: Diagnosis not present

## 2022-02-25 DIAGNOSIS — M11259 Other chondrocalcinosis, unspecified hip: Secondary | ICD-10-CM | POA: Diagnosis not present

## 2022-02-26 DIAGNOSIS — N1330 Unspecified hydronephrosis: Secondary | ICD-10-CM | POA: Diagnosis not present

## 2022-02-26 DIAGNOSIS — K7031 Alcoholic cirrhosis of liver with ascites: Secondary | ICD-10-CM | POA: Diagnosis not present

## 2022-02-26 DIAGNOSIS — R799 Abnormal finding of blood chemistry, unspecified: Secondary | ICD-10-CM | POA: Diagnosis not present

## 2022-02-26 DIAGNOSIS — N139 Obstructive and reflux uropathy, unspecified: Secondary | ICD-10-CM | POA: Diagnosis not present

## 2022-02-26 DIAGNOSIS — Z960 Presence of urogenital implants: Secondary | ICD-10-CM | POA: Diagnosis not present

## 2022-02-26 DIAGNOSIS — R739 Hyperglycemia, unspecified: Secondary | ICD-10-CM | POA: Diagnosis not present

## 2022-02-26 DIAGNOSIS — J9811 Atelectasis: Secondary | ICD-10-CM | POA: Diagnosis not present

## 2022-02-26 DIAGNOSIS — N1339 Other hydronephrosis: Secondary | ICD-10-CM | POA: Diagnosis not present

## 2022-02-26 DIAGNOSIS — R531 Weakness: Secondary | ICD-10-CM | POA: Diagnosis not present

## 2022-02-26 DIAGNOSIS — K7682 Hepatic encephalopathy: Secondary | ICD-10-CM | POA: Diagnosis not present

## 2022-02-26 DIAGNOSIS — K219 Gastro-esophageal reflux disease without esophagitis: Secondary | ICD-10-CM | POA: Diagnosis not present

## 2022-02-26 DIAGNOSIS — K8020 Calculus of gallbladder without cholecystitis without obstruction: Secondary | ICD-10-CM | POA: Diagnosis not present

## 2022-02-26 DIAGNOSIS — R188 Other ascites: Secondary | ICD-10-CM | POA: Diagnosis not present

## 2022-02-26 DIAGNOSIS — B1910 Unspecified viral hepatitis B without hepatic coma: Secondary | ICD-10-CM | POA: Diagnosis not present

## 2022-02-26 DIAGNOSIS — R2689 Other abnormalities of gait and mobility: Secondary | ICD-10-CM | POA: Diagnosis not present

## 2022-02-26 DIAGNOSIS — E119 Type 2 diabetes mellitus without complications: Secondary | ICD-10-CM | POA: Diagnosis not present

## 2022-02-26 DIAGNOSIS — E1165 Type 2 diabetes mellitus with hyperglycemia: Secondary | ICD-10-CM | POA: Diagnosis not present

## 2022-02-26 DIAGNOSIS — M6281 Muscle weakness (generalized): Secondary | ICD-10-CM | POA: Diagnosis not present

## 2022-02-26 DIAGNOSIS — I480 Paroxysmal atrial fibrillation: Secondary | ICD-10-CM | POA: Diagnosis not present

## 2022-02-26 DIAGNOSIS — Z743 Need for continuous supervision: Secondary | ICD-10-CM | POA: Diagnosis not present

## 2022-02-26 DIAGNOSIS — N179 Acute kidney failure, unspecified: Secondary | ICD-10-CM | POA: Diagnosis not present

## 2022-02-26 DIAGNOSIS — N130 Hydronephrosis with ureteropelvic junction obstruction: Secondary | ICD-10-CM | POA: Diagnosis not present

## 2022-02-26 DIAGNOSIS — F1111 Opioid abuse, in remission: Secondary | ICD-10-CM | POA: Diagnosis not present

## 2022-02-26 DIAGNOSIS — R161 Splenomegaly, not elsewhere classified: Secondary | ICD-10-CM | POA: Diagnosis not present

## 2022-02-26 DIAGNOSIS — N3001 Acute cystitis with hematuria: Secondary | ICD-10-CM | POA: Diagnosis not present

## 2022-02-26 DIAGNOSIS — N170 Acute kidney failure with tubular necrosis: Secondary | ICD-10-CM | POA: Diagnosis not present

## 2022-02-26 DIAGNOSIS — M11259 Other chondrocalcinosis, unspecified hip: Secondary | ICD-10-CM | POA: Diagnosis not present

## 2022-02-26 DIAGNOSIS — I10 Essential (primary) hypertension: Secondary | ICD-10-CM | POA: Diagnosis not present

## 2022-02-26 DIAGNOSIS — Z436 Encounter for attention to other artificial openings of urinary tract: Secondary | ICD-10-CM | POA: Diagnosis not present

## 2022-02-26 DIAGNOSIS — D61811 Other drug-induced pancytopenia: Secondary | ICD-10-CM | POA: Diagnosis not present

## 2022-02-26 DIAGNOSIS — D61818 Other pancytopenia: Secondary | ICD-10-CM | POA: Diagnosis not present

## 2022-02-26 DIAGNOSIS — G894 Chronic pain syndrome: Secondary | ICD-10-CM | POA: Diagnosis not present

## 2022-02-26 DIAGNOSIS — R319 Hematuria, unspecified: Secondary | ICD-10-CM | POA: Diagnosis not present

## 2022-02-26 DIAGNOSIS — D696 Thrombocytopenia, unspecified: Secondary | ICD-10-CM | POA: Diagnosis not present

## 2022-02-26 DIAGNOSIS — R278 Other lack of coordination: Secondary | ICD-10-CM | POA: Diagnosis not present

## 2022-02-26 DIAGNOSIS — K7460 Unspecified cirrhosis of liver: Secondary | ICD-10-CM | POA: Diagnosis not present

## 2022-02-26 DIAGNOSIS — F1190 Opioid use, unspecified, uncomplicated: Secondary | ICD-10-CM | POA: Diagnosis not present

## 2022-02-26 DIAGNOSIS — N131 Hydronephrosis with ureteral stricture, not elsewhere classified: Secondary | ICD-10-CM | POA: Diagnosis not present

## 2022-02-26 DIAGNOSIS — K6289 Other specified diseases of anus and rectum: Secondary | ICD-10-CM | POA: Diagnosis not present

## 2022-02-26 DIAGNOSIS — N135 Crossing vessel and stricture of ureter without hydronephrosis: Secondary | ICD-10-CM | POA: Diagnosis not present

## 2022-02-27 DIAGNOSIS — E1165 Type 2 diabetes mellitus with hyperglycemia: Secondary | ICD-10-CM | POA: Diagnosis not present

## 2022-02-27 DIAGNOSIS — R188 Other ascites: Secondary | ICD-10-CM | POA: Diagnosis not present

## 2022-02-27 DIAGNOSIS — N179 Acute kidney failure, unspecified: Secondary | ICD-10-CM | POA: Diagnosis not present

## 2022-02-27 DIAGNOSIS — F1190 Opioid use, unspecified, uncomplicated: Secondary | ICD-10-CM | POA: Diagnosis not present

## 2022-02-27 DIAGNOSIS — K7460 Unspecified cirrhosis of liver: Secondary | ICD-10-CM | POA: Diagnosis not present

## 2022-02-27 DIAGNOSIS — I10 Essential (primary) hypertension: Secondary | ICD-10-CM | POA: Diagnosis not present

## 2022-02-28 DIAGNOSIS — I10 Essential (primary) hypertension: Secondary | ICD-10-CM | POA: Diagnosis not present

## 2022-02-28 DIAGNOSIS — N179 Acute kidney failure, unspecified: Secondary | ICD-10-CM | POA: Diagnosis not present

## 2022-02-28 DIAGNOSIS — F1190 Opioid use, unspecified, uncomplicated: Secondary | ICD-10-CM | POA: Diagnosis not present

## 2022-02-28 DIAGNOSIS — R188 Other ascites: Secondary | ICD-10-CM | POA: Diagnosis not present

## 2022-02-28 DIAGNOSIS — E1165 Type 2 diabetes mellitus with hyperglycemia: Secondary | ICD-10-CM | POA: Diagnosis not present

## 2022-02-28 DIAGNOSIS — K7460 Unspecified cirrhosis of liver: Secondary | ICD-10-CM | POA: Diagnosis not present

## 2022-03-01 DIAGNOSIS — K7460 Unspecified cirrhosis of liver: Secondary | ICD-10-CM | POA: Diagnosis not present

## 2022-03-01 DIAGNOSIS — N179 Acute kidney failure, unspecified: Secondary | ICD-10-CM | POA: Diagnosis not present

## 2022-03-01 DIAGNOSIS — R188 Other ascites: Secondary | ICD-10-CM | POA: Diagnosis not present

## 2022-03-01 DIAGNOSIS — K7682 Hepatic encephalopathy: Secondary | ICD-10-CM | POA: Diagnosis not present

## 2022-03-01 DIAGNOSIS — I480 Paroxysmal atrial fibrillation: Secondary | ICD-10-CM | POA: Diagnosis not present

## 2022-03-01 DIAGNOSIS — F1190 Opioid use, unspecified, uncomplicated: Secondary | ICD-10-CM | POA: Diagnosis not present

## 2022-03-01 DIAGNOSIS — D696 Thrombocytopenia, unspecified: Secondary | ICD-10-CM | POA: Diagnosis not present

## 2022-03-01 DIAGNOSIS — E1165 Type 2 diabetes mellitus with hyperglycemia: Secondary | ICD-10-CM | POA: Diagnosis not present

## 2022-03-01 DIAGNOSIS — I10 Essential (primary) hypertension: Secondary | ICD-10-CM | POA: Diagnosis not present

## 2022-03-02 DIAGNOSIS — D696 Thrombocytopenia, unspecified: Secondary | ICD-10-CM | POA: Diagnosis not present

## 2022-03-02 DIAGNOSIS — F1111 Opioid abuse, in remission: Secondary | ICD-10-CM | POA: Diagnosis not present

## 2022-03-02 DIAGNOSIS — E119 Type 2 diabetes mellitus without complications: Secondary | ICD-10-CM | POA: Diagnosis not present

## 2022-03-02 DIAGNOSIS — R188 Other ascites: Secondary | ICD-10-CM | POA: Diagnosis not present

## 2022-03-02 DIAGNOSIS — F1190 Opioid use, unspecified, uncomplicated: Secondary | ICD-10-CM | POA: Diagnosis not present

## 2022-03-02 DIAGNOSIS — K7682 Hepatic encephalopathy: Secondary | ICD-10-CM | POA: Diagnosis not present

## 2022-03-02 DIAGNOSIS — K7460 Unspecified cirrhosis of liver: Secondary | ICD-10-CM | POA: Diagnosis not present

## 2022-03-02 DIAGNOSIS — I480 Paroxysmal atrial fibrillation: Secondary | ICD-10-CM | POA: Diagnosis not present

## 2022-03-02 DIAGNOSIS — N1330 Unspecified hydronephrosis: Secondary | ICD-10-CM | POA: Diagnosis not present

## 2022-03-02 DIAGNOSIS — K8020 Calculus of gallbladder without cholecystitis without obstruction: Secondary | ICD-10-CM | POA: Diagnosis not present

## 2022-03-02 DIAGNOSIS — R319 Hematuria, unspecified: Secondary | ICD-10-CM | POA: Diagnosis not present

## 2022-03-02 DIAGNOSIS — I10 Essential (primary) hypertension: Secondary | ICD-10-CM | POA: Diagnosis not present

## 2022-03-02 DIAGNOSIS — E1165 Type 2 diabetes mellitus with hyperglycemia: Secondary | ICD-10-CM | POA: Diagnosis not present

## 2022-03-02 DIAGNOSIS — K219 Gastro-esophageal reflux disease without esophagitis: Secondary | ICD-10-CM | POA: Diagnosis not present

## 2022-03-02 DIAGNOSIS — N179 Acute kidney failure, unspecified: Secondary | ICD-10-CM | POA: Diagnosis not present

## 2022-03-02 DIAGNOSIS — M11259 Other chondrocalcinosis, unspecified hip: Secondary | ICD-10-CM | POA: Diagnosis not present

## 2022-03-02 DIAGNOSIS — G894 Chronic pain syndrome: Secondary | ICD-10-CM | POA: Diagnosis not present

## 2022-03-02 DIAGNOSIS — R161 Splenomegaly, not elsewhere classified: Secondary | ICD-10-CM | POA: Diagnosis not present

## 2022-03-03 ENCOUNTER — Encounter: Payer: Self-pay | Admitting: Internal Medicine

## 2022-03-03 DIAGNOSIS — K7682 Hepatic encephalopathy: Secondary | ICD-10-CM | POA: Diagnosis not present

## 2022-03-03 DIAGNOSIS — N170 Acute kidney failure with tubular necrosis: Secondary | ICD-10-CM | POA: Diagnosis not present

## 2022-03-03 DIAGNOSIS — K7031 Alcoholic cirrhosis of liver with ascites: Secondary | ICD-10-CM | POA: Diagnosis not present

## 2022-03-03 DIAGNOSIS — I480 Paroxysmal atrial fibrillation: Secondary | ICD-10-CM | POA: Diagnosis not present

## 2022-03-03 DIAGNOSIS — K7460 Unspecified cirrhosis of liver: Secondary | ICD-10-CM | POA: Diagnosis not present

## 2022-03-03 DIAGNOSIS — R188 Other ascites: Secondary | ICD-10-CM | POA: Diagnosis not present

## 2022-03-03 DIAGNOSIS — J9811 Atelectasis: Secondary | ICD-10-CM | POA: Diagnosis not present

## 2022-03-03 DIAGNOSIS — D61818 Other pancytopenia: Secondary | ICD-10-CM | POA: Diagnosis not present

## 2022-03-03 DIAGNOSIS — N1330 Unspecified hydronephrosis: Secondary | ICD-10-CM | POA: Diagnosis not present

## 2022-03-03 DIAGNOSIS — I10 Essential (primary) hypertension: Secondary | ICD-10-CM | POA: Diagnosis not present

## 2022-03-03 DIAGNOSIS — D696 Thrombocytopenia, unspecified: Secondary | ICD-10-CM | POA: Diagnosis not present

## 2022-03-03 DIAGNOSIS — F1190 Opioid use, unspecified, uncomplicated: Secondary | ICD-10-CM | POA: Diagnosis not present

## 2022-03-03 DIAGNOSIS — E1165 Type 2 diabetes mellitus with hyperglycemia: Secondary | ICD-10-CM | POA: Diagnosis not present

## 2022-03-03 DIAGNOSIS — N179 Acute kidney failure, unspecified: Secondary | ICD-10-CM | POA: Diagnosis not present

## 2022-03-04 DIAGNOSIS — R188 Other ascites: Secondary | ICD-10-CM | POA: Diagnosis not present

## 2022-03-04 DIAGNOSIS — N135 Crossing vessel and stricture of ureter without hydronephrosis: Secondary | ICD-10-CM | POA: Diagnosis not present

## 2022-03-04 DIAGNOSIS — I10 Essential (primary) hypertension: Secondary | ICD-10-CM | POA: Diagnosis not present

## 2022-03-04 DIAGNOSIS — F1190 Opioid use, unspecified, uncomplicated: Secondary | ICD-10-CM | POA: Diagnosis not present

## 2022-03-04 DIAGNOSIS — E1165 Type 2 diabetes mellitus with hyperglycemia: Secondary | ICD-10-CM | POA: Diagnosis not present

## 2022-03-04 DIAGNOSIS — E119 Type 2 diabetes mellitus without complications: Secondary | ICD-10-CM | POA: Diagnosis not present

## 2022-03-04 DIAGNOSIS — K7460 Unspecified cirrhosis of liver: Secondary | ICD-10-CM | POA: Diagnosis not present

## 2022-03-04 DIAGNOSIS — N1339 Other hydronephrosis: Secondary | ICD-10-CM | POA: Diagnosis not present

## 2022-03-04 DIAGNOSIS — I480 Paroxysmal atrial fibrillation: Secondary | ICD-10-CM | POA: Diagnosis not present

## 2022-03-04 DIAGNOSIS — K7682 Hepatic encephalopathy: Secondary | ICD-10-CM | POA: Diagnosis not present

## 2022-03-04 DIAGNOSIS — N179 Acute kidney failure, unspecified: Secondary | ICD-10-CM | POA: Diagnosis not present

## 2022-03-04 DIAGNOSIS — Z436 Encounter for attention to other artificial openings of urinary tract: Secondary | ICD-10-CM | POA: Diagnosis not present

## 2022-03-04 DIAGNOSIS — N131 Hydronephrosis with ureteral stricture, not elsewhere classified: Secondary | ICD-10-CM | POA: Diagnosis not present

## 2022-03-04 DIAGNOSIS — D696 Thrombocytopenia, unspecified: Secondary | ICD-10-CM | POA: Diagnosis not present

## 2022-03-05 DIAGNOSIS — K7460 Unspecified cirrhosis of liver: Secondary | ICD-10-CM | POA: Diagnosis not present

## 2022-03-05 DIAGNOSIS — R188 Other ascites: Secondary | ICD-10-CM | POA: Diagnosis not present

## 2022-03-05 DIAGNOSIS — N1330 Unspecified hydronephrosis: Secondary | ICD-10-CM | POA: Diagnosis not present

## 2022-03-05 DIAGNOSIS — N179 Acute kidney failure, unspecified: Secondary | ICD-10-CM | POA: Diagnosis not present

## 2022-03-05 DIAGNOSIS — I480 Paroxysmal atrial fibrillation: Secondary | ICD-10-CM | POA: Diagnosis not present

## 2022-03-05 DIAGNOSIS — E1165 Type 2 diabetes mellitus with hyperglycemia: Secondary | ICD-10-CM | POA: Diagnosis not present

## 2022-03-05 DIAGNOSIS — F1190 Opioid use, unspecified, uncomplicated: Secondary | ICD-10-CM | POA: Diagnosis not present

## 2022-03-05 DIAGNOSIS — I10 Essential (primary) hypertension: Secondary | ICD-10-CM | POA: Diagnosis not present

## 2022-03-05 DIAGNOSIS — K7682 Hepatic encephalopathy: Secondary | ICD-10-CM | POA: Diagnosis not present

## 2022-03-05 DIAGNOSIS — Z960 Presence of urogenital implants: Secondary | ICD-10-CM | POA: Diagnosis not present

## 2022-03-05 DIAGNOSIS — D696 Thrombocytopenia, unspecified: Secondary | ICD-10-CM | POA: Diagnosis not present

## 2022-03-06 DIAGNOSIS — N179 Acute kidney failure, unspecified: Secondary | ICD-10-CM | POA: Diagnosis not present

## 2022-03-06 DIAGNOSIS — K7682 Hepatic encephalopathy: Secondary | ICD-10-CM | POA: Diagnosis not present

## 2022-03-06 DIAGNOSIS — I480 Paroxysmal atrial fibrillation: Secondary | ICD-10-CM | POA: Diagnosis not present

## 2022-03-06 DIAGNOSIS — R188 Other ascites: Secondary | ICD-10-CM | POA: Diagnosis not present

## 2022-03-06 DIAGNOSIS — D696 Thrombocytopenia, unspecified: Secondary | ICD-10-CM | POA: Diagnosis not present

## 2022-03-06 DIAGNOSIS — I10 Essential (primary) hypertension: Secondary | ICD-10-CM | POA: Diagnosis not present

## 2022-03-06 DIAGNOSIS — F1190 Opioid use, unspecified, uncomplicated: Secondary | ICD-10-CM | POA: Diagnosis not present

## 2022-03-06 DIAGNOSIS — K7460 Unspecified cirrhosis of liver: Secondary | ICD-10-CM | POA: Diagnosis not present

## 2022-03-06 DIAGNOSIS — E1165 Type 2 diabetes mellitus with hyperglycemia: Secondary | ICD-10-CM | POA: Diagnosis not present

## 2022-03-06 DIAGNOSIS — N1330 Unspecified hydronephrosis: Secondary | ICD-10-CM | POA: Diagnosis not present

## 2022-03-07 DIAGNOSIS — R188 Other ascites: Secondary | ICD-10-CM | POA: Diagnosis not present

## 2022-03-07 DIAGNOSIS — N1330 Unspecified hydronephrosis: Secondary | ICD-10-CM | POA: Diagnosis not present

## 2022-03-07 DIAGNOSIS — F1190 Opioid use, unspecified, uncomplicated: Secondary | ICD-10-CM | POA: Diagnosis not present

## 2022-03-07 DIAGNOSIS — D696 Thrombocytopenia, unspecified: Secondary | ICD-10-CM | POA: Diagnosis not present

## 2022-03-07 DIAGNOSIS — I10 Essential (primary) hypertension: Secondary | ICD-10-CM | POA: Diagnosis not present

## 2022-03-07 DIAGNOSIS — K7682 Hepatic encephalopathy: Secondary | ICD-10-CM | POA: Diagnosis not present

## 2022-03-07 DIAGNOSIS — N179 Acute kidney failure, unspecified: Secondary | ICD-10-CM | POA: Diagnosis not present

## 2022-03-07 DIAGNOSIS — K7460 Unspecified cirrhosis of liver: Secondary | ICD-10-CM | POA: Diagnosis not present

## 2022-03-07 DIAGNOSIS — E1165 Type 2 diabetes mellitus with hyperglycemia: Secondary | ICD-10-CM | POA: Diagnosis not present

## 2022-03-07 DIAGNOSIS — I480 Paroxysmal atrial fibrillation: Secondary | ICD-10-CM | POA: Diagnosis not present

## 2022-03-08 DIAGNOSIS — E1165 Type 2 diabetes mellitus with hyperglycemia: Secondary | ICD-10-CM | POA: Diagnosis not present

## 2022-03-08 DIAGNOSIS — F1190 Opioid use, unspecified, uncomplicated: Secondary | ICD-10-CM | POA: Diagnosis not present

## 2022-03-08 DIAGNOSIS — N1330 Unspecified hydronephrosis: Secondary | ICD-10-CM | POA: Diagnosis not present

## 2022-03-08 DIAGNOSIS — N179 Acute kidney failure, unspecified: Secondary | ICD-10-CM | POA: Diagnosis not present

## 2022-03-08 DIAGNOSIS — I10 Essential (primary) hypertension: Secondary | ICD-10-CM | POA: Diagnosis not present

## 2022-03-08 DIAGNOSIS — I480 Paroxysmal atrial fibrillation: Secondary | ICD-10-CM | POA: Diagnosis not present

## 2022-03-08 DIAGNOSIS — K7460 Unspecified cirrhosis of liver: Secondary | ICD-10-CM | POA: Diagnosis not present

## 2022-03-08 DIAGNOSIS — D696 Thrombocytopenia, unspecified: Secondary | ICD-10-CM | POA: Diagnosis not present

## 2022-03-08 DIAGNOSIS — K7682 Hepatic encephalopathy: Secondary | ICD-10-CM | POA: Diagnosis not present

## 2022-03-08 DIAGNOSIS — R188 Other ascites: Secondary | ICD-10-CM | POA: Diagnosis not present

## 2022-03-09 DIAGNOSIS — N179 Acute kidney failure, unspecified: Secondary | ICD-10-CM | POA: Diagnosis not present

## 2022-03-09 DIAGNOSIS — D696 Thrombocytopenia, unspecified: Secondary | ICD-10-CM | POA: Diagnosis not present

## 2022-03-09 DIAGNOSIS — E1165 Type 2 diabetes mellitus with hyperglycemia: Secondary | ICD-10-CM | POA: Diagnosis not present

## 2022-03-09 DIAGNOSIS — N1330 Unspecified hydronephrosis: Secondary | ICD-10-CM | POA: Diagnosis not present

## 2022-03-09 DIAGNOSIS — K7682 Hepatic encephalopathy: Secondary | ICD-10-CM | POA: Diagnosis not present

## 2022-03-09 DIAGNOSIS — K7460 Unspecified cirrhosis of liver: Secondary | ICD-10-CM | POA: Diagnosis not present

## 2022-03-09 DIAGNOSIS — R188 Other ascites: Secondary | ICD-10-CM | POA: Diagnosis not present

## 2022-03-09 DIAGNOSIS — F1190 Opioid use, unspecified, uncomplicated: Secondary | ICD-10-CM | POA: Diagnosis not present

## 2022-03-09 DIAGNOSIS — I480 Paroxysmal atrial fibrillation: Secondary | ICD-10-CM | POA: Diagnosis not present

## 2022-03-09 DIAGNOSIS — I10 Essential (primary) hypertension: Secondary | ICD-10-CM | POA: Diagnosis not present

## 2022-03-10 DIAGNOSIS — N130 Hydronephrosis with ureteropelvic junction obstruction: Secondary | ICD-10-CM | POA: Diagnosis not present

## 2022-03-10 DIAGNOSIS — N179 Acute kidney failure, unspecified: Secondary | ICD-10-CM | POA: Diagnosis not present

## 2022-03-10 DIAGNOSIS — R188 Other ascites: Secondary | ICD-10-CM | POA: Diagnosis not present

## 2022-03-11 DIAGNOSIS — N179 Acute kidney failure, unspecified: Secondary | ICD-10-CM | POA: Diagnosis not present

## 2022-03-12 DIAGNOSIS — N179 Acute kidney failure, unspecified: Secondary | ICD-10-CM | POA: Diagnosis not present

## 2022-03-13 ENCOUNTER — Telehealth: Payer: Medicare HMO | Admitting: Pharmacist

## 2022-03-13 DIAGNOSIS — E440 Moderate protein-calorie malnutrition: Secondary | ICD-10-CM | POA: Diagnosis not present

## 2022-03-13 DIAGNOSIS — N139 Obstructive and reflux uropathy, unspecified: Secondary | ICD-10-CM | POA: Diagnosis not present

## 2022-03-13 DIAGNOSIS — B1910 Unspecified viral hepatitis B without hepatic coma: Secondary | ICD-10-CM | POA: Diagnosis not present

## 2022-03-13 DIAGNOSIS — Z743 Need for continuous supervision: Secondary | ICD-10-CM | POA: Diagnosis not present

## 2022-03-13 DIAGNOSIS — R1084 Generalized abdominal pain: Secondary | ICD-10-CM | POA: Diagnosis not present

## 2022-03-13 DIAGNOSIS — M6281 Muscle weakness (generalized): Secondary | ICD-10-CM | POA: Diagnosis not present

## 2022-03-13 DIAGNOSIS — K7682 Hepatic encephalopathy: Secondary | ICD-10-CM | POA: Diagnosis not present

## 2022-03-13 DIAGNOSIS — D61818 Other pancytopenia: Secondary | ICD-10-CM | POA: Diagnosis not present

## 2022-03-13 DIAGNOSIS — R2689 Other abnormalities of gait and mobility: Secondary | ICD-10-CM | POA: Diagnosis not present

## 2022-03-13 DIAGNOSIS — I10 Essential (primary) hypertension: Secondary | ICD-10-CM | POA: Diagnosis not present

## 2022-03-13 DIAGNOSIS — R531 Weakness: Secondary | ICD-10-CM | POA: Diagnosis not present

## 2022-03-13 DIAGNOSIS — K7460 Unspecified cirrhosis of liver: Secondary | ICD-10-CM | POA: Diagnosis not present

## 2022-03-13 DIAGNOSIS — M545 Low back pain, unspecified: Secondary | ICD-10-CM | POA: Diagnosis not present

## 2022-03-13 DIAGNOSIS — D61811 Other drug-induced pancytopenia: Secondary | ICD-10-CM | POA: Diagnosis not present

## 2022-03-13 DIAGNOSIS — Z7401 Bed confinement status: Secondary | ICD-10-CM | POA: Diagnosis not present

## 2022-03-13 DIAGNOSIS — R278 Other lack of coordination: Secondary | ICD-10-CM | POA: Diagnosis not present

## 2022-03-13 DIAGNOSIS — D696 Thrombocytopenia, unspecified: Secondary | ICD-10-CM | POA: Diagnosis not present

## 2022-03-13 DIAGNOSIS — D638 Anemia in other chronic diseases classified elsewhere: Secondary | ICD-10-CM | POA: Diagnosis not present

## 2022-03-13 DIAGNOSIS — N99528 Other complication of other external stoma of urinary tract: Secondary | ICD-10-CM | POA: Diagnosis not present

## 2022-03-13 DIAGNOSIS — E119 Type 2 diabetes mellitus without complications: Secondary | ICD-10-CM | POA: Diagnosis not present

## 2022-03-13 DIAGNOSIS — K7031 Alcoholic cirrhosis of liver with ascites: Secondary | ICD-10-CM | POA: Diagnosis not present

## 2022-03-13 DIAGNOSIS — E1165 Type 2 diabetes mellitus with hyperglycemia: Secondary | ICD-10-CM | POA: Diagnosis not present

## 2022-03-13 DIAGNOSIS — N179 Acute kidney failure, unspecified: Secondary | ICD-10-CM | POA: Diagnosis not present

## 2022-03-13 DIAGNOSIS — T83022A Displacement of nephrostomy catheter, initial encounter: Secondary | ICD-10-CM | POA: Diagnosis not present

## 2022-03-13 DIAGNOSIS — N1330 Unspecified hydronephrosis: Secondary | ICD-10-CM | POA: Diagnosis not present

## 2022-03-13 DIAGNOSIS — F1120 Opioid dependence, uncomplicated: Secondary | ICD-10-CM | POA: Diagnosis not present

## 2022-03-13 DIAGNOSIS — I480 Paroxysmal atrial fibrillation: Secondary | ICD-10-CM | POA: Diagnosis not present

## 2022-03-13 DIAGNOSIS — F1190 Opioid use, unspecified, uncomplicated: Secondary | ICD-10-CM | POA: Diagnosis not present

## 2022-03-16 DIAGNOSIS — E119 Type 2 diabetes mellitus without complications: Secondary | ICD-10-CM | POA: Diagnosis not present

## 2022-03-16 DIAGNOSIS — I10 Essential (primary) hypertension: Secondary | ICD-10-CM | POA: Diagnosis not present

## 2022-03-16 DIAGNOSIS — K7031 Alcoholic cirrhosis of liver with ascites: Secondary | ICD-10-CM | POA: Diagnosis not present

## 2022-03-16 DIAGNOSIS — N179 Acute kidney failure, unspecified: Secondary | ICD-10-CM | POA: Diagnosis not present

## 2022-03-16 DIAGNOSIS — F1190 Opioid use, unspecified, uncomplicated: Secondary | ICD-10-CM | POA: Diagnosis not present

## 2022-03-16 DIAGNOSIS — I480 Paroxysmal atrial fibrillation: Secondary | ICD-10-CM | POA: Diagnosis not present

## 2022-03-16 DIAGNOSIS — D61818 Other pancytopenia: Secondary | ICD-10-CM | POA: Diagnosis not present

## 2022-03-16 DIAGNOSIS — N139 Obstructive and reflux uropathy, unspecified: Secondary | ICD-10-CM | POA: Diagnosis not present

## 2022-03-16 DIAGNOSIS — N1330 Unspecified hydronephrosis: Secondary | ICD-10-CM | POA: Diagnosis not present

## 2022-03-17 DIAGNOSIS — D61811 Other drug-induced pancytopenia: Secondary | ICD-10-CM | POA: Diagnosis not present

## 2022-03-17 DIAGNOSIS — E119 Type 2 diabetes mellitus without complications: Secondary | ICD-10-CM | POA: Diagnosis not present

## 2022-03-17 DIAGNOSIS — N139 Obstructive and reflux uropathy, unspecified: Secondary | ICD-10-CM | POA: Diagnosis not present

## 2022-03-17 DIAGNOSIS — B1910 Unspecified viral hepatitis B without hepatic coma: Secondary | ICD-10-CM | POA: Diagnosis not present

## 2022-03-17 DIAGNOSIS — D696 Thrombocytopenia, unspecified: Secondary | ICD-10-CM | POA: Diagnosis not present

## 2022-03-17 DIAGNOSIS — N179 Acute kidney failure, unspecified: Secondary | ICD-10-CM | POA: Diagnosis not present

## 2022-03-17 DIAGNOSIS — D61818 Other pancytopenia: Secondary | ICD-10-CM | POA: Diagnosis not present

## 2022-03-17 DIAGNOSIS — K7031 Alcoholic cirrhosis of liver with ascites: Secondary | ICD-10-CM | POA: Diagnosis not present

## 2022-03-17 DIAGNOSIS — E1165 Type 2 diabetes mellitus with hyperglycemia: Secondary | ICD-10-CM | POA: Diagnosis not present

## 2022-03-17 DIAGNOSIS — D638 Anemia in other chronic diseases classified elsewhere: Secondary | ICD-10-CM | POA: Diagnosis not present

## 2022-03-17 DIAGNOSIS — E440 Moderate protein-calorie malnutrition: Secondary | ICD-10-CM | POA: Diagnosis not present

## 2022-03-17 DIAGNOSIS — N1330 Unspecified hydronephrosis: Secondary | ICD-10-CM | POA: Diagnosis not present

## 2022-03-17 DIAGNOSIS — F1120 Opioid dependence, uncomplicated: Secondary | ICD-10-CM | POA: Diagnosis not present

## 2022-03-18 NOTE — Patient Instructions (Incomplete)
Our records indicate that you are due for your annual mammogram/breast imaging. While there is no way to prevent breast cancer, early detection provides the best opportunity for curing it. For women over the age of 40, the American Cancer Society recommends a yearly clinical breast exam and a yearly mammogram. These practices have saved thousands of lives. We need your help to ensure that you are receiving optimal medical care. Please call the imaging location that has done you previous mammograms. Please remember to list us as your primary care. This helps make sure we receive a report and can update your chart.  Below is the contact information for several local breast imaging centers. You may call the location that works best for you, and they will be happy to assistance in making you an appointment. You do not need an order for a regular screening mammogram. However, if you are having any problems or concerns with you breast area, please let your primary care provider know, and appropriate orders will be placed. Please let our office know if you have any questions or concerns. Or if you need information for another imaging center not on this list or outside of the area. We are commented to working with you on your health care journey.   The mobile unit/bus ([facility]) - they come twice a month to our location.  These appointments can be made through our office or by call The Breast Center  [facility]  [address] [place], [place] [address] Phone [phone]  [facility] Radiology Department  [address]  [place], [place] [address] [phone]  [facility]  [address] [place], [place] [address] [phone]  [facility] [place]  [address] [place] [address] [phone]  [facility]  [address], [address] [place] [address] [phone]  [facility] [place]  [address] [place], [place] [address] [phone]  [facility] [address] [place], [place] [address] [phone]  [facility] [address]  [address] [place], [place] [address] [phone]  [facility] [address] [place] [address] [phone]     

## 2022-03-19 ENCOUNTER — Encounter: Payer: Self-pay | Admitting: Family Medicine

## 2022-03-19 ENCOUNTER — Ambulatory Visit: Payer: Medicare HMO | Admitting: Family Medicine

## 2022-03-26 DIAGNOSIS — K7682 Hepatic encephalopathy: Secondary | ICD-10-CM | POA: Diagnosis not present

## 2022-03-26 DIAGNOSIS — K7031 Alcoholic cirrhosis of liver with ascites: Secondary | ICD-10-CM | POA: Diagnosis not present

## 2022-03-26 DIAGNOSIS — M545 Low back pain, unspecified: Secondary | ICD-10-CM | POA: Diagnosis not present

## 2022-03-26 DIAGNOSIS — N1330 Unspecified hydronephrosis: Secondary | ICD-10-CM | POA: Diagnosis not present

## 2022-03-26 DIAGNOSIS — K7460 Unspecified cirrhosis of liver: Secondary | ICD-10-CM | POA: Diagnosis not present

## 2022-03-27 DIAGNOSIS — N99528 Other complication of other external stoma of urinary tract: Secondary | ICD-10-CM | POA: Diagnosis not present

## 2022-03-27 DIAGNOSIS — Z7401 Bed confinement status: Secondary | ICD-10-CM | POA: Diagnosis not present

## 2022-03-27 DIAGNOSIS — R531 Weakness: Secondary | ICD-10-CM | POA: Diagnosis not present

## 2022-03-27 DIAGNOSIS — R1084 Generalized abdominal pain: Secondary | ICD-10-CM | POA: Diagnosis not present

## 2022-03-29 DIAGNOSIS — I10 Essential (primary) hypertension: Secondary | ICD-10-CM | POA: Diagnosis not present

## 2022-03-30 DIAGNOSIS — F1190 Opioid use, unspecified, uncomplicated: Secondary | ICD-10-CM | POA: Diagnosis not present

## 2022-03-30 DIAGNOSIS — N139 Obstructive and reflux uropathy, unspecified: Secondary | ICD-10-CM | POA: Diagnosis not present

## 2022-03-30 DIAGNOSIS — K7031 Alcoholic cirrhosis of liver with ascites: Secondary | ICD-10-CM | POA: Diagnosis not present

## 2022-03-30 DIAGNOSIS — E876 Hypokalemia: Secondary | ICD-10-CM | POA: Diagnosis not present

## 2022-03-30 DIAGNOSIS — I480 Paroxysmal atrial fibrillation: Secondary | ICD-10-CM | POA: Diagnosis not present

## 2022-03-30 DIAGNOSIS — F32A Depression, unspecified: Secondary | ICD-10-CM | POA: Diagnosis not present

## 2022-03-30 DIAGNOSIS — K7682 Hepatic encephalopathy: Secondary | ICD-10-CM | POA: Diagnosis not present

## 2022-03-30 DIAGNOSIS — E1165 Type 2 diabetes mellitus with hyperglycemia: Secondary | ICD-10-CM | POA: Diagnosis not present

## 2022-04-02 ENCOUNTER — Telehealth: Payer: Self-pay | Admitting: Family Medicine

## 2022-04-02 NOTE — Telephone Encounter (Signed)
FYI

## 2022-04-02 NOTE — Telephone Encounter (Signed)
Pts daughter in law called to inform pts PCP that pt is currently in a nursing home in [place]. Was initially sent there for Rehab but now has no extended stay or benefits with insurance without PCP sending orders for any of it. Wants to know how they need to go about getting orders.  Daughter in [place] is aware that she is not on HIPAA and that we can only talk to patient, spouse, or nursing facility. She is going the call the nursing home and see if they can contact our office about this.

## 2022-04-09 DIAGNOSIS — Z23 Encounter for immunization: Secondary | ICD-10-CM | POA: Diagnosis not present

## 2022-04-15 DIAGNOSIS — Z936 Other artificial openings of urinary tract status: Secondary | ICD-10-CM | POA: Diagnosis not present

## 2022-04-15 DIAGNOSIS — Z466 Encounter for fitting and adjustment of urinary device: Secondary | ICD-10-CM | POA: Diagnosis not present

## 2022-04-15 DIAGNOSIS — Z436 Encounter for attention to other artificial openings of urinary tract: Secondary | ICD-10-CM | POA: Diagnosis not present

## 2022-04-15 DIAGNOSIS — Z960 Presence of urogenital implants: Secondary | ICD-10-CM | POA: Diagnosis not present

## 2022-04-15 DIAGNOSIS — N135 Crossing vessel and stricture of ureter without hydronephrosis: Secondary | ICD-10-CM | POA: Diagnosis not present

## 2022-04-20 DIAGNOSIS — K7682 Hepatic encephalopathy: Secondary | ICD-10-CM | POA: Diagnosis not present

## 2022-04-20 DIAGNOSIS — J101 Influenza due to other identified influenza virus with other respiratory manifestations: Secondary | ICD-10-CM | POA: Diagnosis not present

## 2022-04-20 DIAGNOSIS — Z936 Other artificial openings of urinary tract status: Secondary | ICD-10-CM | POA: Diagnosis not present

## 2022-04-20 DIAGNOSIS — T83512A Infection and inflammatory reaction due to nephrostomy catheter, initial encounter: Secondary | ICD-10-CM | POA: Diagnosis not present

## 2022-04-20 DIAGNOSIS — K766 Portal hypertension: Secondary | ICD-10-CM | POA: Diagnosis not present

## 2022-04-20 DIAGNOSIS — K8020 Calculus of gallbladder without cholecystitis without obstruction: Secondary | ICD-10-CM | POA: Diagnosis not present

## 2022-04-20 DIAGNOSIS — K7460 Unspecified cirrhosis of liver: Secondary | ICD-10-CM | POA: Diagnosis not present

## 2022-04-20 DIAGNOSIS — K319 Disease of stomach and duodenum, unspecified: Secondary | ICD-10-CM | POA: Diagnosis not present

## 2022-04-20 DIAGNOSIS — I480 Paroxysmal atrial fibrillation: Secondary | ICD-10-CM | POA: Diagnosis not present

## 2022-04-20 DIAGNOSIS — B952 Enterococcus as the cause of diseases classified elsewhere: Secondary | ICD-10-CM | POA: Diagnosis not present

## 2022-04-20 DIAGNOSIS — F32A Depression, unspecified: Secondary | ICD-10-CM | POA: Diagnosis not present

## 2022-04-20 DIAGNOSIS — R82998 Other abnormal findings in urine: Secondary | ICD-10-CM | POA: Diagnosis not present

## 2022-04-20 DIAGNOSIS — Z8719 Personal history of other diseases of the digestive system: Secondary | ICD-10-CM | POA: Diagnosis not present

## 2022-04-20 DIAGNOSIS — R8271 Bacteriuria: Secondary | ICD-10-CM | POA: Diagnosis not present

## 2022-04-20 DIAGNOSIS — N390 Urinary tract infection, site not specified: Secondary | ICD-10-CM | POA: Diagnosis not present

## 2022-04-20 DIAGNOSIS — Z20822 Contact with and (suspected) exposure to covid-19: Secondary | ICD-10-CM | POA: Diagnosis not present

## 2022-04-20 DIAGNOSIS — Z66 Do not resuscitate: Secondary | ICD-10-CM | POA: Diagnosis not present

## 2022-04-20 DIAGNOSIS — R4182 Altered mental status, unspecified: Secondary | ICD-10-CM | POA: Diagnosis not present

## 2022-04-20 DIAGNOSIS — E1122 Type 2 diabetes mellitus with diabetic chronic kidney disease: Secondary | ICD-10-CM | POA: Diagnosis not present

## 2022-04-20 DIAGNOSIS — E871 Hypo-osmolality and hyponatremia: Secondary | ICD-10-CM | POA: Diagnosis not present

## 2022-04-20 DIAGNOSIS — N179 Acute kidney failure, unspecified: Secondary | ICD-10-CM | POA: Diagnosis not present

## 2022-04-20 DIAGNOSIS — N182 Chronic kidney disease, stage 2 (mild): Secondary | ICD-10-CM | POA: Diagnosis not present

## 2022-04-20 DIAGNOSIS — G928 Other toxic encephalopathy: Secondary | ICD-10-CM | POA: Diagnosis not present

## 2022-04-20 DIAGNOSIS — J111 Influenza due to unidentified influenza virus with other respiratory manifestations: Secondary | ICD-10-CM | POA: Diagnosis not present

## 2022-04-20 DIAGNOSIS — R911 Solitary pulmonary nodule: Secondary | ICD-10-CM | POA: Diagnosis not present

## 2022-04-20 DIAGNOSIS — D75829 Heparin-induced thrombocytopenia, unspecified: Secondary | ICD-10-CM | POA: Diagnosis not present

## 2022-04-20 DIAGNOSIS — R5383 Other fatigue: Secondary | ICD-10-CM | POA: Diagnosis not present

## 2022-04-20 DIAGNOSIS — I129 Hypertensive chronic kidney disease with stage 1 through stage 4 chronic kidney disease, or unspecified chronic kidney disease: Secondary | ICD-10-CM | POA: Diagnosis not present

## 2022-04-21 NOTE — Progress Notes (Signed)
[facility] referral received. I have attempted to reach the patient regarding referral but was unable to reach the patient directly. VM box is full, therefore I was unable to leave a VM.   

## 2022-04-25 DIAGNOSIS — J101 Influenza due to other identified influenza virus with other respiratory manifestations: Secondary | ICD-10-CM | POA: Diagnosis not present

## 2022-04-25 DIAGNOSIS — D696 Thrombocytopenia, unspecified: Secondary | ICD-10-CM | POA: Diagnosis not present

## 2022-04-25 DIAGNOSIS — K449 Diaphragmatic hernia without obstruction or gangrene: Secondary | ICD-10-CM | POA: Diagnosis not present

## 2022-04-25 DIAGNOSIS — K7682 Hepatic encephalopathy: Secondary | ICD-10-CM | POA: Diagnosis not present

## 2022-04-25 DIAGNOSIS — E870 Hyperosmolality and hypernatremia: Secondary | ICD-10-CM | POA: Diagnosis not present

## 2022-04-25 DIAGNOSIS — I4721 Torsades de pointes: Secondary | ICD-10-CM | POA: Diagnosis not present

## 2022-04-25 DIAGNOSIS — A4181 Sepsis due to Enterococcus: Secondary | ICD-10-CM | POA: Diagnosis not present

## 2022-04-25 DIAGNOSIS — I4729 Other ventricular tachycardia: Secondary | ICD-10-CM | POA: Diagnosis not present

## 2022-04-25 DIAGNOSIS — R278 Other lack of coordination: Secondary | ICD-10-CM | POA: Diagnosis not present

## 2022-04-25 DIAGNOSIS — J1000 Influenza due to other identified influenza virus with unspecified type of pneumonia: Secondary | ICD-10-CM | POA: Diagnosis not present

## 2022-04-25 DIAGNOSIS — Z981 Arthrodesis status: Secondary | ICD-10-CM | POA: Diagnosis not present

## 2022-04-25 DIAGNOSIS — K8020 Calculus of gallbladder without cholecystitis without obstruction: Secondary | ICD-10-CM | POA: Diagnosis not present

## 2022-04-25 DIAGNOSIS — J9601 Acute respiratory failure with hypoxia: Secondary | ICD-10-CM | POA: Diagnosis not present

## 2022-04-25 DIAGNOSIS — N179 Acute kidney failure, unspecified: Secondary | ICD-10-CM | POA: Diagnosis not present

## 2022-04-25 DIAGNOSIS — R188 Other ascites: Secondary | ICD-10-CM | POA: Diagnosis not present

## 2022-04-25 DIAGNOSIS — N1330 Unspecified hydronephrosis: Secondary | ICD-10-CM | POA: Diagnosis not present

## 2022-04-25 DIAGNOSIS — M47812 Spondylosis without myelopathy or radiculopathy, cervical region: Secondary | ICD-10-CM | POA: Diagnosis not present

## 2022-04-25 DIAGNOSIS — J111 Influenza due to unidentified influenza virus with other respiratory manifestations: Secondary | ICD-10-CM | POA: Diagnosis not present

## 2022-04-25 DIAGNOSIS — B1910 Unspecified viral hepatitis B without hepatic coma: Secondary | ICD-10-CM | POA: Diagnosis not present

## 2022-04-25 DIAGNOSIS — R0989 Other specified symptoms and signs involving the circulatory and respiratory systems: Secondary | ICD-10-CM | POA: Diagnosis not present

## 2022-04-25 DIAGNOSIS — E876 Hypokalemia: Secondary | ICD-10-CM | POA: Diagnosis not present

## 2022-04-25 DIAGNOSIS — Z794 Long term (current) use of insulin: Secondary | ICD-10-CM | POA: Diagnosis not present

## 2022-04-25 DIAGNOSIS — E440 Moderate protein-calorie malnutrition: Secondary | ICD-10-CM | POA: Diagnosis not present

## 2022-04-25 DIAGNOSIS — I517 Cardiomegaly: Secondary | ICD-10-CM | POA: Diagnosis not present

## 2022-04-25 DIAGNOSIS — N390 Urinary tract infection, site not specified: Secondary | ICD-10-CM | POA: Diagnosis not present

## 2022-04-25 DIAGNOSIS — I480 Paroxysmal atrial fibrillation: Secondary | ICD-10-CM | POA: Diagnosis not present

## 2022-04-25 DIAGNOSIS — R930 Abnormal findings on diagnostic imaging of skull and head, not elsewhere classified: Secondary | ICD-10-CM | POA: Diagnosis not present

## 2022-04-25 DIAGNOSIS — T83512A Infection and inflammatory reaction due to nephrostomy catheter, initial encounter: Secondary | ICD-10-CM | POA: Diagnosis not present

## 2022-04-25 DIAGNOSIS — I8510 Secondary esophageal varices without bleeding: Secondary | ICD-10-CM | POA: Diagnosis not present

## 2022-04-25 DIAGNOSIS — D649 Anemia, unspecified: Secondary | ICD-10-CM | POA: Diagnosis not present

## 2022-04-25 DIAGNOSIS — A419 Sepsis, unspecified organism: Secondary | ICD-10-CM | POA: Diagnosis not present

## 2022-04-25 DIAGNOSIS — G9341 Metabolic encephalopathy: Secondary | ICD-10-CM | POA: Diagnosis not present

## 2022-04-25 DIAGNOSIS — N136 Pyonephrosis: Secondary | ICD-10-CM | POA: Diagnosis not present

## 2022-04-25 DIAGNOSIS — G894 Chronic pain syndrome: Secondary | ICD-10-CM | POA: Diagnosis not present

## 2022-04-25 DIAGNOSIS — D61818 Other pancytopenia: Secondary | ICD-10-CM | POA: Diagnosis not present

## 2022-04-25 DIAGNOSIS — R54 Age-related physical debility: Secondary | ICD-10-CM | POA: Diagnosis not present

## 2022-04-25 DIAGNOSIS — K639 Disease of intestine, unspecified: Secondary | ICD-10-CM | POA: Diagnosis not present

## 2022-04-25 DIAGNOSIS — R4182 Altered mental status, unspecified: Secondary | ICD-10-CM | POA: Diagnosis not present

## 2022-04-25 DIAGNOSIS — R059 Cough, unspecified: Secondary | ICD-10-CM | POA: Diagnosis not present

## 2022-04-25 DIAGNOSIS — R0902 Hypoxemia: Secondary | ICD-10-CM | POA: Diagnosis not present

## 2022-04-25 DIAGNOSIS — N19 Unspecified kidney failure: Secondary | ICD-10-CM | POA: Diagnosis not present

## 2022-04-25 DIAGNOSIS — E1122 Type 2 diabetes mellitus with diabetic chronic kidney disease: Secondary | ICD-10-CM | POA: Diagnosis not present

## 2022-04-25 DIAGNOSIS — E119 Type 2 diabetes mellitus without complications: Secondary | ICD-10-CM | POA: Diagnosis not present

## 2022-04-25 DIAGNOSIS — K7031 Alcoholic cirrhosis of liver with ascites: Secondary | ICD-10-CM | POA: Diagnosis not present

## 2022-04-25 DIAGNOSIS — Z743 Need for continuous supervision: Secondary | ICD-10-CM | POA: Diagnosis not present

## 2022-04-25 DIAGNOSIS — R29898 Other symptoms and signs involving the musculoskeletal system: Secondary | ICD-10-CM | POA: Diagnosis not present

## 2022-04-25 DIAGNOSIS — E8721 Acute metabolic acidosis: Secondary | ICD-10-CM | POA: Diagnosis not present

## 2022-04-25 DIAGNOSIS — N182 Chronic kidney disease, stage 2 (mild): Secondary | ICD-10-CM | POA: Diagnosis not present

## 2022-04-25 DIAGNOSIS — F32A Depression, unspecified: Secondary | ICD-10-CM | POA: Diagnosis not present

## 2022-04-25 DIAGNOSIS — R918 Other nonspecific abnormal finding of lung field: Secondary | ICD-10-CM | POA: Diagnosis not present

## 2022-04-25 DIAGNOSIS — I5189 Other ill-defined heart diseases: Secondary | ICD-10-CM | POA: Diagnosis not present

## 2022-04-25 DIAGNOSIS — R0602 Shortness of breath: Secondary | ICD-10-CM | POA: Diagnosis not present

## 2022-04-25 DIAGNOSIS — M6281 Muscle weakness (generalized): Secondary | ICD-10-CM | POA: Diagnosis not present

## 2022-04-25 DIAGNOSIS — K7011 Alcoholic hepatitis with ascites: Secondary | ICD-10-CM | POA: Diagnosis not present

## 2022-04-25 DIAGNOSIS — J984 Other disorders of lung: Secondary | ICD-10-CM | POA: Diagnosis not present

## 2022-04-25 DIAGNOSIS — Z66 Do not resuscitate: Secondary | ICD-10-CM | POA: Diagnosis not present

## 2022-04-25 DIAGNOSIS — E1165 Type 2 diabetes mellitus with hyperglycemia: Secondary | ICD-10-CM | POA: Diagnosis not present

## 2022-04-25 DIAGNOSIS — K7460 Unspecified cirrhosis of liver: Secondary | ICD-10-CM | POA: Diagnosis not present

## 2022-04-25 DIAGNOSIS — D61811 Other drug-induced pancytopenia: Secondary | ICD-10-CM | POA: Diagnosis not present

## 2022-04-25 DIAGNOSIS — R2689 Other abnormalities of gait and mobility: Secondary | ICD-10-CM | POA: Diagnosis not present

## 2022-05-08 DIAGNOSIS — N939 Abnormal uterine and vaginal bleeding, unspecified: Secondary | ICD-10-CM | POA: Diagnosis not present

## 2022-05-08 DIAGNOSIS — Z20822 Contact with and (suspected) exposure to covid-19: Secondary | ICD-10-CM | POA: Diagnosis not present

## 2022-05-08 DIAGNOSIS — F1190 Opioid use, unspecified, uncomplicated: Secondary | ICD-10-CM | POA: Diagnosis not present

## 2022-05-08 DIAGNOSIS — K7682 Hepatic encephalopathy: Secondary | ICD-10-CM | POA: Diagnosis not present

## 2022-05-08 DIAGNOSIS — Z743 Need for continuous supervision: Secondary | ICD-10-CM | POA: Diagnosis not present

## 2022-05-08 DIAGNOSIS — E440 Moderate protein-calorie malnutrition: Secondary | ICD-10-CM | POA: Diagnosis not present

## 2022-05-08 DIAGNOSIS — J90 Pleural effusion, not elsewhere classified: Secondary | ICD-10-CM | POA: Diagnosis not present

## 2022-05-08 DIAGNOSIS — Z7401 Bed confinement status: Secondary | ICD-10-CM | POA: Diagnosis not present

## 2022-05-08 DIAGNOSIS — F1120 Opioid dependence, uncomplicated: Secondary | ICD-10-CM | POA: Diagnosis not present

## 2022-05-08 DIAGNOSIS — E1165 Type 2 diabetes mellitus with hyperglycemia: Secondary | ICD-10-CM | POA: Diagnosis not present

## 2022-05-08 DIAGNOSIS — I480 Paroxysmal atrial fibrillation: Secondary | ICD-10-CM | POA: Diagnosis not present

## 2022-05-08 DIAGNOSIS — K7460 Unspecified cirrhosis of liver: Secondary | ICD-10-CM | POA: Diagnosis not present

## 2022-05-08 DIAGNOSIS — D61811 Other drug-induced pancytopenia: Secondary | ICD-10-CM | POA: Diagnosis not present

## 2022-05-08 DIAGNOSIS — R109 Unspecified abdominal pain: Secondary | ICD-10-CM | POA: Diagnosis not present

## 2022-05-08 DIAGNOSIS — R29898 Other symptoms and signs involving the musculoskeletal system: Secondary | ICD-10-CM | POA: Diagnosis not present

## 2022-05-08 DIAGNOSIS — G9341 Metabolic encephalopathy: Secondary | ICD-10-CM | POA: Diagnosis not present

## 2022-05-08 DIAGNOSIS — D61818 Other pancytopenia: Secondary | ICD-10-CM | POA: Diagnosis not present

## 2022-05-08 DIAGNOSIS — K7031 Alcoholic cirrhosis of liver with ascites: Secondary | ICD-10-CM | POA: Diagnosis not present

## 2022-05-08 DIAGNOSIS — T83512A Infection and inflammatory reaction due to nephrostomy catheter, initial encounter: Secondary | ICD-10-CM | POA: Diagnosis not present

## 2022-05-08 DIAGNOSIS — E876 Hypokalemia: Secondary | ICD-10-CM | POA: Diagnosis not present

## 2022-05-08 DIAGNOSIS — K639 Disease of intestine, unspecified: Secondary | ICD-10-CM | POA: Diagnosis not present

## 2022-05-08 DIAGNOSIS — C538 Malignant neoplasm of overlapping sites of cervix uteri: Secondary | ICD-10-CM | POA: Diagnosis not present

## 2022-05-08 DIAGNOSIS — R531 Weakness: Secondary | ICD-10-CM | POA: Diagnosis not present

## 2022-05-08 DIAGNOSIS — E119 Type 2 diabetes mellitus without complications: Secondary | ICD-10-CM | POA: Diagnosis not present

## 2022-05-08 DIAGNOSIS — K644 Residual hemorrhoidal skin tags: Secondary | ICD-10-CM | POA: Diagnosis not present

## 2022-05-08 DIAGNOSIS — J189 Pneumonia, unspecified organism: Secondary | ICD-10-CM | POA: Diagnosis not present

## 2022-05-08 DIAGNOSIS — R918 Other nonspecific abnormal finding of lung field: Secondary | ICD-10-CM | POA: Diagnosis not present

## 2022-05-08 DIAGNOSIS — R0689 Other abnormalities of breathing: Secondary | ICD-10-CM | POA: Diagnosis not present

## 2022-05-08 DIAGNOSIS — Z8679 Personal history of other diseases of the circulatory system: Secondary | ICD-10-CM | POA: Diagnosis not present

## 2022-05-08 DIAGNOSIS — J9601 Acute respiratory failure with hypoxia: Secondary | ICD-10-CM | POA: Diagnosis not present

## 2022-05-08 DIAGNOSIS — A4181 Sepsis due to Enterococcus: Secondary | ICD-10-CM | POA: Diagnosis not present

## 2022-05-08 DIAGNOSIS — R590 Localized enlarged lymph nodes: Secondary | ICD-10-CM | POA: Diagnosis not present

## 2022-05-08 DIAGNOSIS — J811 Chronic pulmonary edema: Secondary | ICD-10-CM | POA: Diagnosis not present

## 2022-05-08 DIAGNOSIS — R188 Other ascites: Secondary | ICD-10-CM | POA: Diagnosis not present

## 2022-05-08 DIAGNOSIS — M6281 Muscle weakness (generalized): Secondary | ICD-10-CM | POA: Diagnosis not present

## 2022-05-08 DIAGNOSIS — L89150 Pressure ulcer of sacral region, unstageable: Secondary | ICD-10-CM | POA: Diagnosis not present

## 2022-05-08 DIAGNOSIS — B1910 Unspecified viral hepatitis B without hepatic coma: Secondary | ICD-10-CM | POA: Diagnosis not present

## 2022-05-08 DIAGNOSIS — J810 Acute pulmonary edema: Secondary | ICD-10-CM | POA: Diagnosis not present

## 2022-05-08 DIAGNOSIS — R410 Disorientation, unspecified: Secondary | ICD-10-CM | POA: Diagnosis not present

## 2022-05-08 DIAGNOSIS — J1000 Influenza due to other identified influenza virus with unspecified type of pneumonia: Secondary | ICD-10-CM | POA: Diagnosis not present

## 2022-05-08 DIAGNOSIS — Z79899 Other long term (current) drug therapy: Secondary | ICD-10-CM | POA: Diagnosis not present

## 2022-05-08 DIAGNOSIS — R102 Pelvic and perineal pain: Secondary | ICD-10-CM | POA: Diagnosis not present

## 2022-05-08 DIAGNOSIS — B181 Chronic viral hepatitis B without delta-agent: Secondary | ICD-10-CM | POA: Diagnosis not present

## 2022-05-08 DIAGNOSIS — I10 Essential (primary) hypertension: Secondary | ICD-10-CM | POA: Diagnosis not present

## 2022-05-08 DIAGNOSIS — R4182 Altered mental status, unspecified: Secondary | ICD-10-CM | POA: Diagnosis not present

## 2022-05-08 DIAGNOSIS — D696 Thrombocytopenia, unspecified: Secondary | ICD-10-CM | POA: Diagnosis not present

## 2022-05-08 DIAGNOSIS — R58 Hemorrhage, not elsewhere classified: Secondary | ICD-10-CM | POA: Diagnosis not present

## 2022-05-08 DIAGNOSIS — R2689 Other abnormalities of gait and mobility: Secondary | ICD-10-CM | POA: Diagnosis not present

## 2022-05-08 DIAGNOSIS — R278 Other lack of coordination: Secondary | ICD-10-CM | POA: Diagnosis not present

## 2022-05-08 DIAGNOSIS — J984 Other disorders of lung: Secondary | ICD-10-CM | POA: Diagnosis not present

## 2022-05-09 DIAGNOSIS — K7460 Unspecified cirrhosis of liver: Secondary | ICD-10-CM | POA: Diagnosis not present

## 2022-05-09 DIAGNOSIS — J90 Pleural effusion, not elsewhere classified: Secondary | ICD-10-CM | POA: Diagnosis not present

## 2022-05-09 DIAGNOSIS — J810 Acute pulmonary edema: Secondary | ICD-10-CM | POA: Diagnosis not present

## 2022-05-09 DIAGNOSIS — R102 Pelvic and perineal pain: Secondary | ICD-10-CM | POA: Diagnosis not present

## 2022-05-09 DIAGNOSIS — Z20822 Contact with and (suspected) exposure to covid-19: Secondary | ICD-10-CM | POA: Diagnosis not present

## 2022-05-09 DIAGNOSIS — J811 Chronic pulmonary edema: Secondary | ICD-10-CM | POA: Diagnosis not present

## 2022-05-09 DIAGNOSIS — K644 Residual hemorrhoidal skin tags: Secondary | ICD-10-CM | POA: Diagnosis not present

## 2022-05-09 DIAGNOSIS — R918 Other nonspecific abnormal finding of lung field: Secondary | ICD-10-CM | POA: Diagnosis not present

## 2022-05-09 DIAGNOSIS — R590 Localized enlarged lymph nodes: Secondary | ICD-10-CM | POA: Diagnosis not present

## 2022-05-09 DIAGNOSIS — R410 Disorientation, unspecified: Secondary | ICD-10-CM | POA: Diagnosis not present

## 2022-05-09 DIAGNOSIS — R188 Other ascites: Secondary | ICD-10-CM | POA: Diagnosis not present

## 2022-05-09 DIAGNOSIS — C538 Malignant neoplasm of overlapping sites of cervix uteri: Secondary | ICD-10-CM | POA: Diagnosis not present

## 2022-05-09 DIAGNOSIS — R0689 Other abnormalities of breathing: Secondary | ICD-10-CM | POA: Diagnosis not present

## 2022-05-09 DIAGNOSIS — N939 Abnormal uterine and vaginal bleeding, unspecified: Secondary | ICD-10-CM | POA: Diagnosis not present

## 2022-05-09 DIAGNOSIS — R109 Unspecified abdominal pain: Secondary | ICD-10-CM | POA: Diagnosis not present

## 2022-05-11 DIAGNOSIS — K7031 Alcoholic cirrhosis of liver with ascites: Secondary | ICD-10-CM | POA: Diagnosis not present

## 2022-05-11 DIAGNOSIS — Z8679 Personal history of other diseases of the circulatory system: Secondary | ICD-10-CM | POA: Diagnosis not present

## 2022-05-11 DIAGNOSIS — K7682 Hepatic encephalopathy: Secondary | ICD-10-CM | POA: Diagnosis not present

## 2022-05-11 DIAGNOSIS — D61811 Other drug-induced pancytopenia: Secondary | ICD-10-CM | POA: Diagnosis not present

## 2022-05-11 DIAGNOSIS — L89150 Pressure ulcer of sacral region, unstageable: Secondary | ICD-10-CM | POA: Diagnosis not present

## 2022-05-11 DIAGNOSIS — F1190 Opioid use, unspecified, uncomplicated: Secondary | ICD-10-CM | POA: Diagnosis not present

## 2022-05-11 DIAGNOSIS — E119 Type 2 diabetes mellitus without complications: Secondary | ICD-10-CM | POA: Diagnosis not present

## 2022-05-11 DIAGNOSIS — D696 Thrombocytopenia, unspecified: Secondary | ICD-10-CM | POA: Diagnosis not present

## 2022-05-11 DIAGNOSIS — J189 Pneumonia, unspecified organism: Secondary | ICD-10-CM | POA: Diagnosis not present

## 2022-05-11 DIAGNOSIS — B1910 Unspecified viral hepatitis B without hepatic coma: Secondary | ICD-10-CM | POA: Diagnosis not present

## 2022-05-11 DIAGNOSIS — I10 Essential (primary) hypertension: Secondary | ICD-10-CM | POA: Diagnosis not present

## 2022-05-12 DIAGNOSIS — K7682 Hepatic encephalopathy: Secondary | ICD-10-CM | POA: Diagnosis not present

## 2022-05-12 DIAGNOSIS — E440 Moderate protein-calorie malnutrition: Secondary | ICD-10-CM | POA: Diagnosis not present

## 2022-05-12 DIAGNOSIS — F1190 Opioid use, unspecified, uncomplicated: Secondary | ICD-10-CM | POA: Diagnosis not present

## 2022-05-12 DIAGNOSIS — D61818 Other pancytopenia: Secondary | ICD-10-CM | POA: Diagnosis not present

## 2022-05-12 DIAGNOSIS — K7031 Alcoholic cirrhosis of liver with ascites: Secondary | ICD-10-CM | POA: Diagnosis not present

## 2022-05-12 DIAGNOSIS — E119 Type 2 diabetes mellitus without complications: Secondary | ICD-10-CM | POA: Diagnosis not present

## 2022-05-12 DIAGNOSIS — B181 Chronic viral hepatitis B without delta-agent: Secondary | ICD-10-CM | POA: Diagnosis not present

## 2022-05-12 DIAGNOSIS — J189 Pneumonia, unspecified organism: Secondary | ICD-10-CM | POA: Diagnosis not present

## 2022-05-12 DIAGNOSIS — R4182 Altered mental status, unspecified: Secondary | ICD-10-CM | POA: Diagnosis not present

## 2022-05-18 DIAGNOSIS — K7031 Alcoholic cirrhosis of liver with ascites: Secondary | ICD-10-CM | POA: Diagnosis not present

## 2022-05-18 DIAGNOSIS — J189 Pneumonia, unspecified organism: Secondary | ICD-10-CM | POA: Diagnosis not present

## 2022-05-18 DIAGNOSIS — F1120 Opioid dependence, uncomplicated: Secondary | ICD-10-CM | POA: Diagnosis not present

## 2022-05-19 DIAGNOSIS — D696 Thrombocytopenia, unspecified: Secondary | ICD-10-CM | POA: Diagnosis not present

## 2022-05-19 DIAGNOSIS — D61811 Other drug-induced pancytopenia: Secondary | ICD-10-CM | POA: Diagnosis not present

## 2022-05-19 DIAGNOSIS — B1910 Unspecified viral hepatitis B without hepatic coma: Secondary | ICD-10-CM | POA: Diagnosis not present

## 2022-05-19 DIAGNOSIS — L89150 Pressure ulcer of sacral region, unstageable: Secondary | ICD-10-CM | POA: Diagnosis not present

## 2022-06-02 DIAGNOSIS — B1910 Unspecified viral hepatitis B without hepatic coma: Secondary | ICD-10-CM | POA: Diagnosis not present

## 2022-06-02 DIAGNOSIS — L89150 Pressure ulcer of sacral region, unstageable: Secondary | ICD-10-CM | POA: Diagnosis not present

## 2022-06-02 DIAGNOSIS — D696 Thrombocytopenia, unspecified: Secondary | ICD-10-CM | POA: Diagnosis not present

## 2022-06-02 DIAGNOSIS — D61811 Other drug-induced pancytopenia: Secondary | ICD-10-CM | POA: Diagnosis not present

## 2022-06-03 DIAGNOSIS — E1165 Type 2 diabetes mellitus with hyperglycemia: Secondary | ICD-10-CM | POA: Diagnosis not present

## 2022-06-03 DIAGNOSIS — N135 Crossing vessel and stricture of ureter without hydronephrosis: Secondary | ICD-10-CM | POA: Diagnosis not present

## 2022-06-03 DIAGNOSIS — I480 Paroxysmal atrial fibrillation: Secondary | ICD-10-CM | POA: Diagnosis not present

## 2022-06-03 DIAGNOSIS — Z436 Encounter for attention to other artificial openings of urinary tract: Secondary | ICD-10-CM | POA: Diagnosis not present

## 2022-06-03 DIAGNOSIS — Z960 Presence of urogenital implants: Secondary | ICD-10-CM | POA: Diagnosis not present

## 2022-06-03 NOTE — Progress Notes (Signed)
[facility] referral received. I have attempted to reach the patient regarding referral but was unable to reach the patient directly. VM box is full, therefore I was unable to leave a VM.

## 2022-06-05 DIAGNOSIS — F1120 Opioid dependence, uncomplicated: Secondary | ICD-10-CM | POA: Diagnosis not present

## 2022-06-05 DIAGNOSIS — F1190 Opioid use, unspecified, uncomplicated: Secondary | ICD-10-CM | POA: Diagnosis not present

## 2022-06-05 DIAGNOSIS — G8929 Other chronic pain: Secondary | ICD-10-CM | POA: Diagnosis not present

## 2022-06-09 DIAGNOSIS — B1910 Unspecified viral hepatitis B without hepatic coma: Secondary | ICD-10-CM | POA: Diagnosis not present

## 2022-06-09 DIAGNOSIS — D61811 Other drug-induced pancytopenia: Secondary | ICD-10-CM | POA: Diagnosis not present

## 2022-06-09 DIAGNOSIS — D696 Thrombocytopenia, unspecified: Secondary | ICD-10-CM | POA: Diagnosis not present

## 2022-06-09 DIAGNOSIS — L89150 Pressure ulcer of sacral region, unstageable: Secondary | ICD-10-CM | POA: Diagnosis not present

## 2022-07-13 DEATH — deceased

## 2024-01-12 IMAGING — MR MR LUMBAR SPINE W/O CM
6 of 8 series · 37 of 48 positions shown · non-contrast
Comparison: 02/03/2019

CLINICAL DATA: Low back pain, history of prior surgery. Unable to
raise the right leg for 1 month.

EXAM:
MRI LUMBAR SPINE WITHOUT CONTRAST
TECHNIQUE: Multiplanar, multisequence MR imaging of the lumbar spine was
performed. No intravenous contrast was administered.

[Series 9: T2 · sagittal · 4.0mm · 0.81mm/px · 5 of 18 slices shown (1 of 3)]
[im 1/18]
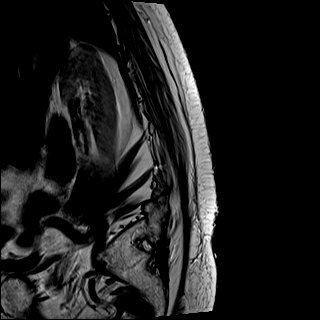
[im 5/18]
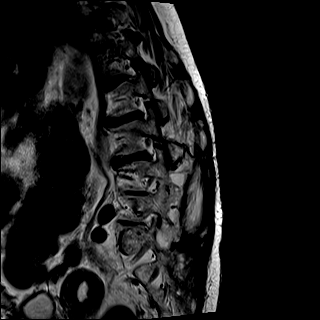
[im 9/18]
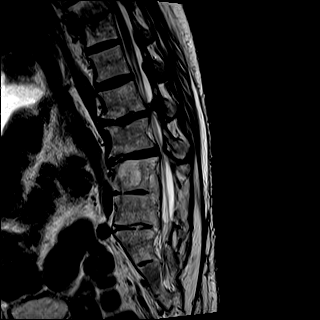
[im 13/18]
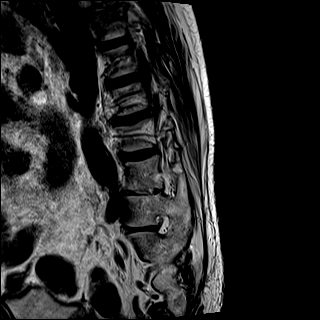
[im 18/18]
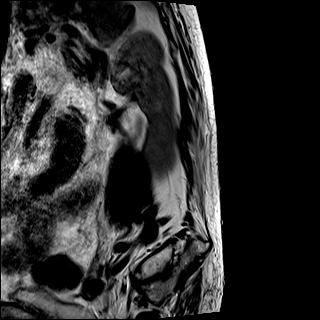

[Series 11: T1 · sagittal · 4.0mm · 1.02mm/px · 5 of 18 slices shown (1 of 3)]
[im 1/18]
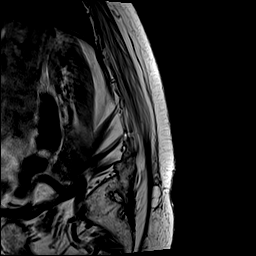
[im 5/18]
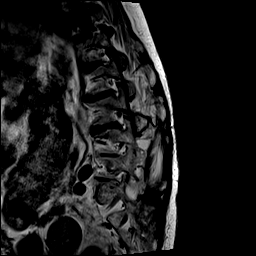
[im 9/18]
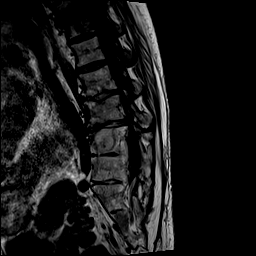
[im 13/18]
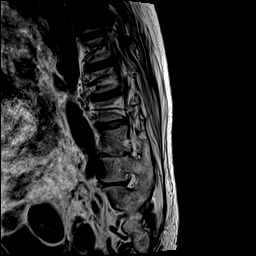
[im 18/18]
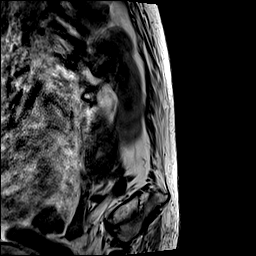

[Series 12: T2 · axial · 4.0mm · 0.70mm/px · z∈[-93,+85]mm · 9 of 33 slices shown (2 of 3)]
[im 1/33]
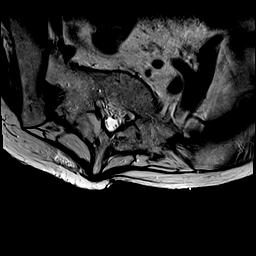
[im 5/33]
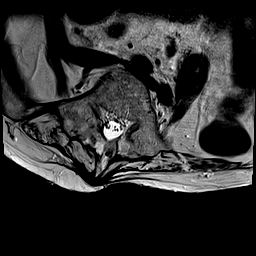
[im 9/33]
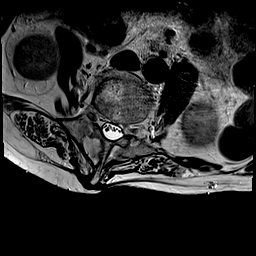
[im 13/33]
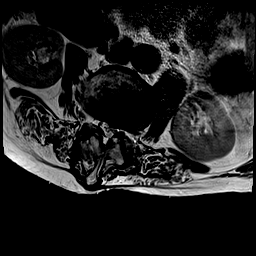
[im 17/33]
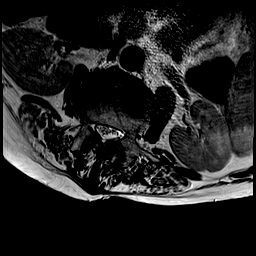
[im 21/33]
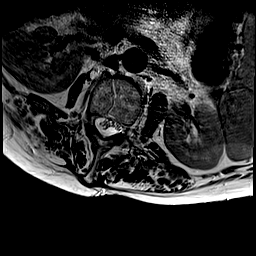
[im 25/33]
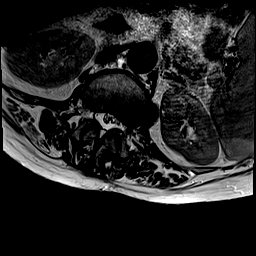
[im 29/33]
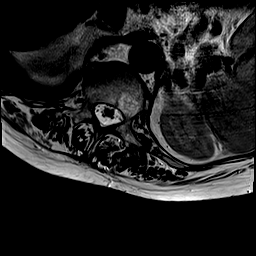
[im 33/33]
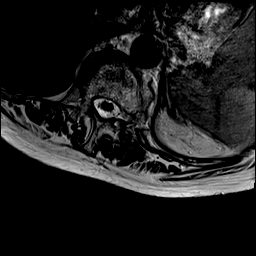

[Series 13: T1 · axial · 4.0mm · 0.35mm/px · z∈[-93,+85]mm · 8 of 33 slices shown (2 of 3)]
[im 1/33]
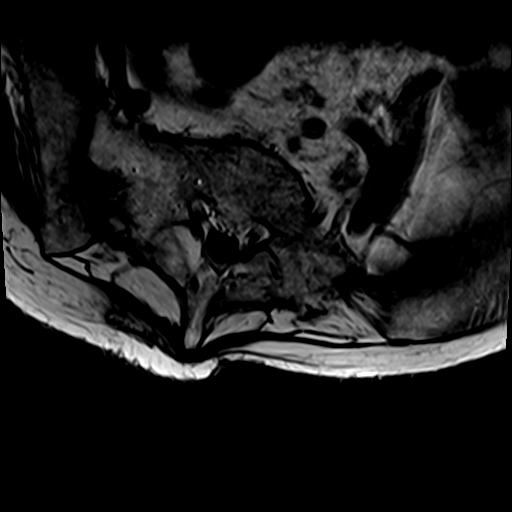
[im 5/33]
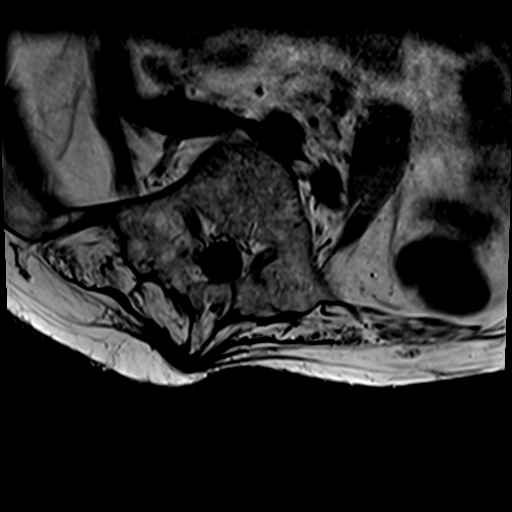
[im 9/33]
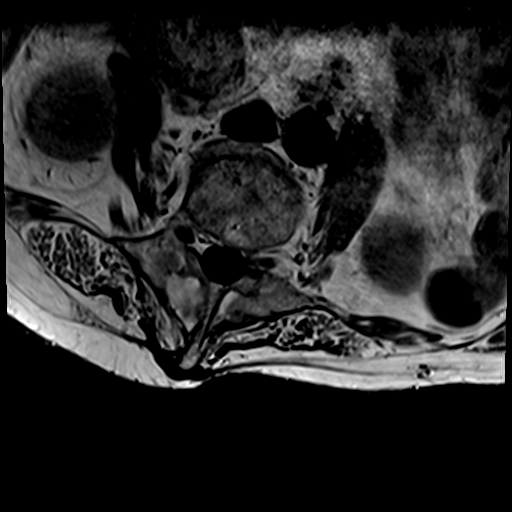
[im 13/33]
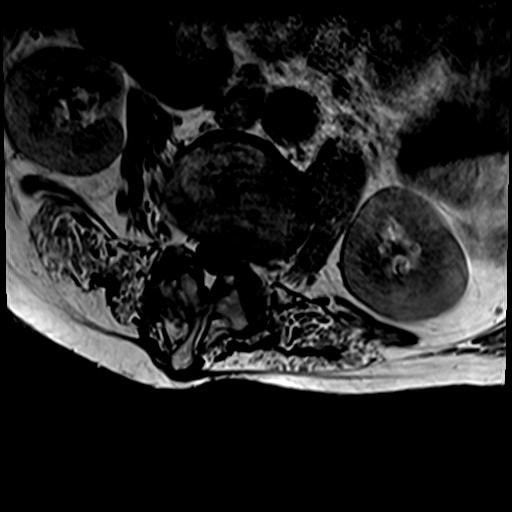
[im 21/33]
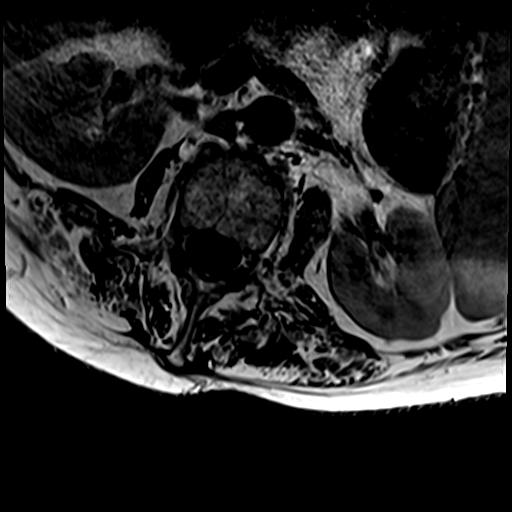
[im 25/33]
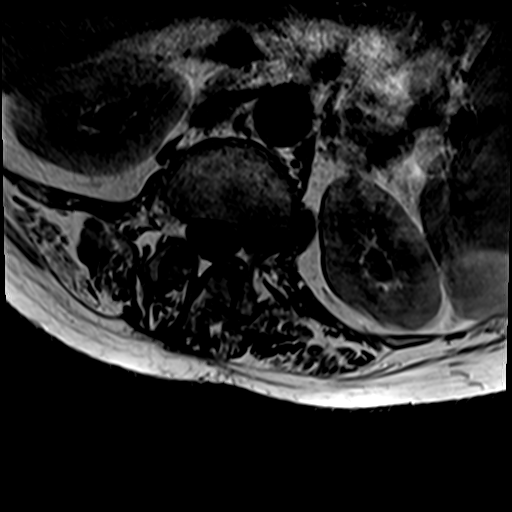
[im 29/33]
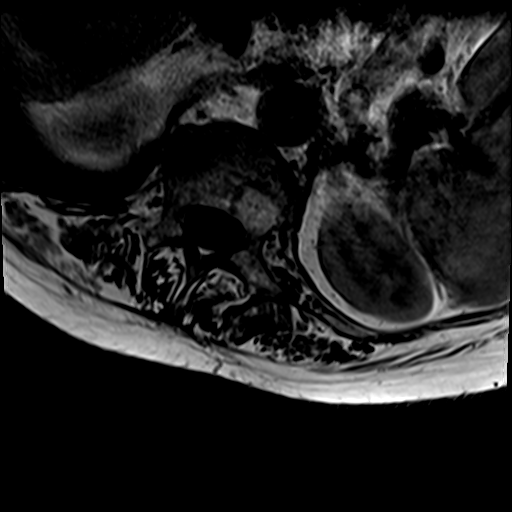
[im 33/33]
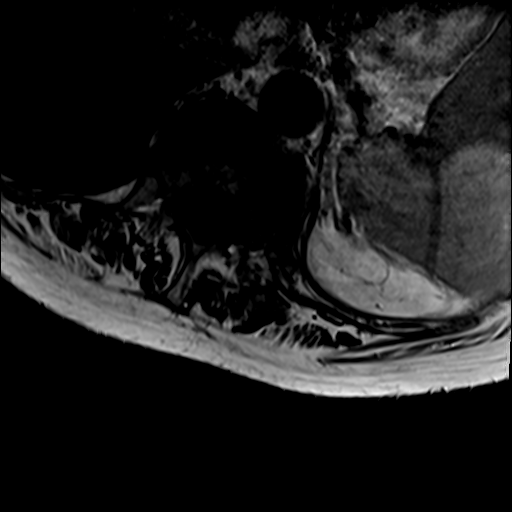

[Series 14: T2 · sagittal · 4.0mm · 0.81mm/px · 5 of 18 slices shown (3 of 3)]
[im 1/18]
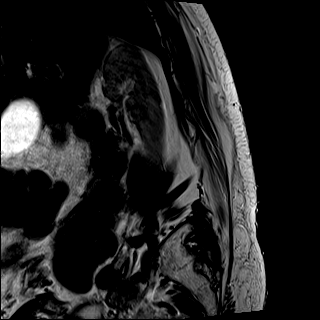
[im 5/18]
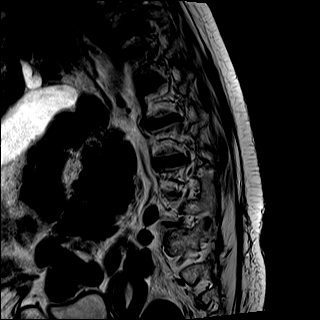
[im 9/18]
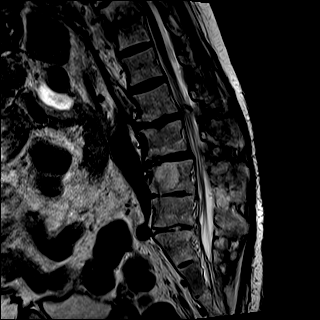
[im 13/18]
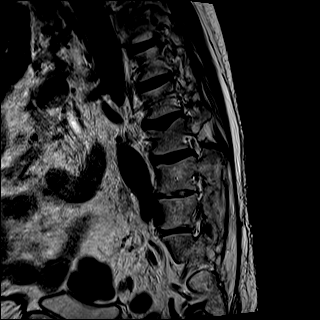
[im 18/18]
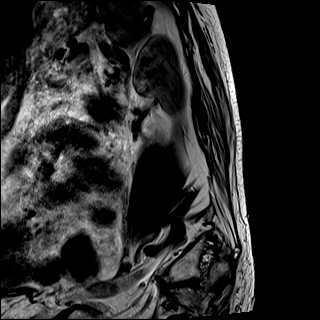

[Series 16: T1 · sagittal · 4.0mm · 1.02mm/px · 5 of 18 slices shown (3 of 3)]
[im 1/18]
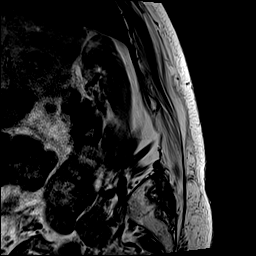
[im 5/18]
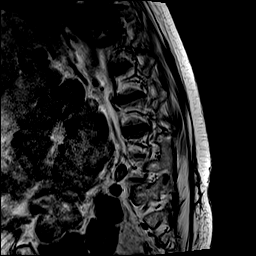
[im 9/18]
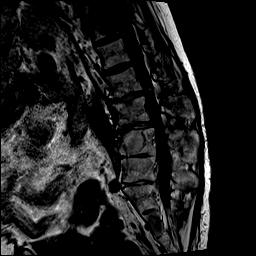
[im 13/18]
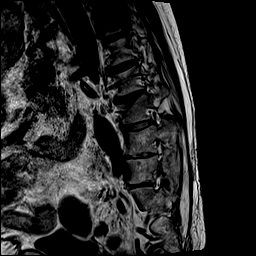
[im 18/18]
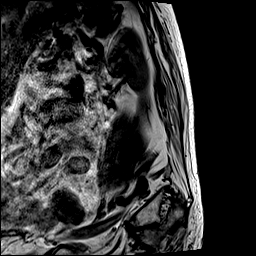

[37 of 48 positions shown; findings below may reference images not displayed]

FINDINGS: Segmentation:  Standard.

Alignment:  Physiologic.

Vertebrae: Severe disc height loss, endplate destruction centered at
T11-12 with bone marrow edema throughout the vertebral bodies most
consistent with discitis-osteomyelitis which appears improved
compared with 05/15/2021 but is incompletely imaged. No acute
fracture or aggressive bone lesion. Schmorl's node along the
superior endplate of L3. Mild chronic anterior L3 vertebral body
height loss.

Conus medullaris and cauda equina: Conus extends to the T12-L1
level. Conus and cauda equina appear normal.

Paraspinal and other soft tissues: No acute paraspinal abnormality.

Disc levels:

Disc spaces: Fusion across the L4-5 and L5-S1 disc spaces.
Degenerative disease with disc height loss at L2-3 and L3-4.

T12-L1: No significant disc bulge. No neural foraminal stenosis. No
central canal stenosis. Mild bilateral facet arthropathy.

L1-L2: No significant disc bulge. No neural foraminal stenosis. No
central canal stenosis. Mild bilateral facet arthropathy.

L2-L3: Mild broad-based disc bulge. Mild bilateral facet arthropathy
with a focal left facet osteophyte projecting into the spinal canal
with left lateral recess stenosis. Moderate spinal stenosis. No
foraminal stenosis.

L3-L4: Broad-based disc bulge. Bilateral lateral recess stenosis.
Moderate bilateral facet arthropathy. Mild spinal stenosis. No
foraminal stenosis.

L4-L5: Interbody fusion. No foraminal or central canal stenosis.
Ankylosis of bilateral facet joints.

L5-S1: Interbody fusion. No foraminal or central canal stenosis.
Ankylosis of bilateral facet joints.
IMPRESSION: 1. Severe disc height loss, endplate destruction centered at T11-12
with bone marrow edema throughout the vertebral bodies most
consistent with discitis-osteomyelitis which appears improved
compared with 05/15/2021 but is incompletely imaged. If there is
further clinical concern, recommend an MRI of the thoracic spine
without and with intravenous contrast.
2. Lumbar spine spondylosis as described above.
